# Patient Record
Sex: Male | Born: 1945 | Race: White | Hispanic: No | Marital: Married | Smoking: Former smoker
Health system: Southern US, Community
[De-identification: ages and names within clinical notes are randomized; demographics above are authoritative.]

## PROBLEM LIST (undated history)

## (undated) DIAGNOSIS — Z7901 Long term (current) use of anticoagulants: Secondary | ICD-10-CM

## (undated) DIAGNOSIS — E669 Obesity, unspecified: Secondary | ICD-10-CM

## (undated) DIAGNOSIS — R61 Generalized hyperhidrosis: Secondary | ICD-10-CM

## (undated) DIAGNOSIS — I639 Cerebral infarction, unspecified: Secondary | ICD-10-CM

## (undated) DIAGNOSIS — I77810 Thoracic aortic ectasia: Secondary | ICD-10-CM

## (undated) DIAGNOSIS — N183 Chronic kidney disease, stage 3 (moderate): Secondary | ICD-10-CM

## (undated) DIAGNOSIS — I452 Bifascicular block: Secondary | ICD-10-CM

## (undated) DIAGNOSIS — M109 Gout, unspecified: Secondary | ICD-10-CM

## (undated) DIAGNOSIS — M6282 Rhabdomyolysis: Secondary | ICD-10-CM

## (undated) DIAGNOSIS — Q6689 Other  specified congenital deformities of feet: Secondary | ICD-10-CM

## (undated) DIAGNOSIS — I4891 Unspecified atrial fibrillation: Principal | ICD-10-CM

## (undated) HISTORY — PX: HERNIA REPAIR: SHX51

## (undated) HISTORY — DX: Unspecified atrial fibrillation: I48.91

## (undated) HISTORY — PX: KIDNEY STONE SURGERY: SHX686

## (undated) HISTORY — DX: Obesity, unspecified: E66.9

## (undated) HISTORY — DX: Bifascicular block: I45.2

## (undated) HISTORY — DX: Generalized hyperhidrosis: R61

## (undated) HISTORY — DX: Chronic kidney disease, stage 3 (moderate): N18.3

## (undated) HISTORY — DX: Long term (current) use of anticoagulants: Z79.01

## (undated) HISTORY — DX: Thoracic aortic ectasia: I77.810

## (undated) HISTORY — PX: CATARACT EXTRACTION: SUR2

---

## 2007-08-09 ENCOUNTER — Ambulatory Visit (HOSPITAL_COMMUNITY): Admission: RE | Admit: 2007-08-09 | Discharge: 2007-08-09 | Payer: Self-pay | Admitting: Cardiology

## 2007-08-09 ENCOUNTER — Encounter (INDEPENDENT_AMBULATORY_CARE_PROVIDER_SITE_OTHER): Payer: Self-pay | Admitting: Cardiology

## 2012-09-28 DIAGNOSIS — I10 Essential (primary) hypertension: Secondary | ICD-10-CM | POA: Diagnosis not present

## 2012-09-28 DIAGNOSIS — I4891 Unspecified atrial fibrillation: Secondary | ICD-10-CM | POA: Diagnosis not present

## 2012-09-30 DIAGNOSIS — I4891 Unspecified atrial fibrillation: Secondary | ICD-10-CM | POA: Diagnosis not present

## 2012-10-03 DIAGNOSIS — I4891 Unspecified atrial fibrillation: Secondary | ICD-10-CM | POA: Diagnosis not present

## 2012-10-03 DIAGNOSIS — Z7901 Long term (current) use of anticoagulants: Secondary | ICD-10-CM | POA: Diagnosis not present

## 2012-10-11 DIAGNOSIS — I10 Essential (primary) hypertension: Secondary | ICD-10-CM | POA: Diagnosis not present

## 2012-10-11 DIAGNOSIS — Z7901 Long term (current) use of anticoagulants: Secondary | ICD-10-CM | POA: Diagnosis not present

## 2012-10-11 DIAGNOSIS — I4891 Unspecified atrial fibrillation: Secondary | ICD-10-CM | POA: Diagnosis not present

## 2012-10-13 DIAGNOSIS — I4891 Unspecified atrial fibrillation: Secondary | ICD-10-CM | POA: Diagnosis not present

## 2012-10-25 DIAGNOSIS — I4891 Unspecified atrial fibrillation: Secondary | ICD-10-CM | POA: Diagnosis not present

## 2012-10-25 DIAGNOSIS — Z7901 Long term (current) use of anticoagulants: Secondary | ICD-10-CM | POA: Diagnosis not present

## 2012-11-22 DIAGNOSIS — Z7901 Long term (current) use of anticoagulants: Secondary | ICD-10-CM | POA: Diagnosis not present

## 2012-11-22 DIAGNOSIS — I4891 Unspecified atrial fibrillation: Secondary | ICD-10-CM | POA: Diagnosis not present

## 2012-12-28 DIAGNOSIS — R61 Generalized hyperhidrosis: Secondary | ICD-10-CM | POA: Diagnosis not present

## 2012-12-28 DIAGNOSIS — Z7901 Long term (current) use of anticoagulants: Secondary | ICD-10-CM | POA: Diagnosis not present

## 2012-12-28 DIAGNOSIS — I4891 Unspecified atrial fibrillation: Secondary | ICD-10-CM | POA: Diagnosis not present

## 2012-12-28 DIAGNOSIS — I10 Essential (primary) hypertension: Secondary | ICD-10-CM | POA: Diagnosis not present

## 2013-01-05 DIAGNOSIS — I4891 Unspecified atrial fibrillation: Secondary | ICD-10-CM | POA: Diagnosis not present

## 2013-01-05 DIAGNOSIS — I10 Essential (primary) hypertension: Secondary | ICD-10-CM | POA: Diagnosis not present

## 2013-01-05 DIAGNOSIS — N189 Chronic kidney disease, unspecified: Secondary | ICD-10-CM | POA: Diagnosis not present

## 2013-01-05 DIAGNOSIS — R05 Cough: Secondary | ICD-10-CM | POA: Diagnosis not present

## 2013-01-05 DIAGNOSIS — K429 Umbilical hernia without obstruction or gangrene: Secondary | ICD-10-CM | POA: Diagnosis not present

## 2013-01-05 DIAGNOSIS — M2010 Hallux valgus (acquired), unspecified foot: Secondary | ICD-10-CM | POA: Diagnosis not present

## 2013-01-05 DIAGNOSIS — D631 Anemia in chronic kidney disease: Secondary | ICD-10-CM | POA: Diagnosis not present

## 2013-01-05 DIAGNOSIS — M109 Gout, unspecified: Secondary | ICD-10-CM | POA: Diagnosis not present

## 2013-01-11 DIAGNOSIS — Z7901 Long term (current) use of anticoagulants: Secondary | ICD-10-CM | POA: Diagnosis not present

## 2013-01-11 DIAGNOSIS — I4891 Unspecified atrial fibrillation: Secondary | ICD-10-CM | POA: Diagnosis not present

## 2013-01-27 ENCOUNTER — Other Ambulatory Visit: Payer: Self-pay | Admitting: Internal Medicine

## 2013-01-27 DIAGNOSIS — R05 Cough: Secondary | ICD-10-CM | POA: Diagnosis not present

## 2013-01-27 DIAGNOSIS — D638 Anemia in other chronic diseases classified elsewhere: Secondary | ICD-10-CM | POA: Diagnosis not present

## 2013-01-27 DIAGNOSIS — Z23 Encounter for immunization: Secondary | ICD-10-CM | POA: Diagnosis not present

## 2013-01-27 DIAGNOSIS — Z136 Encounter for screening for cardiovascular disorders: Secondary | ICD-10-CM | POA: Diagnosis not present

## 2013-01-27 DIAGNOSIS — M109 Gout, unspecified: Secondary | ICD-10-CM | POA: Diagnosis not present

## 2013-01-27 DIAGNOSIS — Z139 Encounter for screening, unspecified: Secondary | ICD-10-CM

## 2013-01-27 DIAGNOSIS — Z0000 Encounter for general adult medical examination without abnormal findings: Secondary | ICD-10-CM | POA: Diagnosis not present

## 2013-01-27 DIAGNOSIS — Z1211 Encounter for screening for malignant neoplasm of colon: Secondary | ICD-10-CM | POA: Diagnosis not present

## 2013-02-02 ENCOUNTER — Ambulatory Visit
Admission: RE | Admit: 2013-02-02 | Discharge: 2013-02-02 | Disposition: A | Discharge status: Home / Self Care | Payer: Medicare Other | Source: Ambulatory Visit | Attending: Internal Medicine | Admitting: Internal Medicine

## 2013-02-02 DIAGNOSIS — Z136 Encounter for screening for cardiovascular disorders: Secondary | ICD-10-CM | POA: Diagnosis not present

## 2013-02-02 DIAGNOSIS — Z139 Encounter for screening, unspecified: Secondary | ICD-10-CM

## 2013-02-08 ENCOUNTER — Ambulatory Visit (INDEPENDENT_AMBULATORY_CARE_PROVIDER_SITE_OTHER): Payer: Medicare Other | Admitting: Pharmacist

## 2013-02-08 DIAGNOSIS — I4891 Unspecified atrial fibrillation: Secondary | ICD-10-CM

## 2013-02-08 DIAGNOSIS — I4819 Other persistent atrial fibrillation: Secondary | ICD-10-CM | POA: Insufficient documentation

## 2013-02-08 DIAGNOSIS — Z1211 Encounter for screening for malignant neoplasm of colon: Secondary | ICD-10-CM | POA: Diagnosis not present

## 2013-02-08 HISTORY — DX: Unspecified atrial fibrillation: I48.91

## 2013-02-13 DIAGNOSIS — Z23 Encounter for immunization: Secondary | ICD-10-CM | POA: Diagnosis not present

## 2013-02-14 DIAGNOSIS — R05 Cough: Secondary | ICD-10-CM | POA: Diagnosis not present

## 2013-02-17 ENCOUNTER — Ambulatory Visit (INDEPENDENT_AMBULATORY_CARE_PROVIDER_SITE_OTHER): Payer: Medicare Other | Admitting: Pharmacist

## 2013-02-17 DIAGNOSIS — I4891 Unspecified atrial fibrillation: Secondary | ICD-10-CM | POA: Diagnosis not present

## 2013-03-03 ENCOUNTER — Ambulatory Visit (INDEPENDENT_AMBULATORY_CARE_PROVIDER_SITE_OTHER): Payer: Medicare Other | Admitting: Pharmacist

## 2013-03-03 DIAGNOSIS — I4891 Unspecified atrial fibrillation: Secondary | ICD-10-CM | POA: Diagnosis not present

## 2013-03-28 ENCOUNTER — Ambulatory Visit (INDEPENDENT_AMBULATORY_CARE_PROVIDER_SITE_OTHER): Payer: Medicare Other | Admitting: Cardiology

## 2013-03-28 ENCOUNTER — Ambulatory Visit (INDEPENDENT_AMBULATORY_CARE_PROVIDER_SITE_OTHER): Payer: Medicare Other | Admitting: *Deleted

## 2013-03-28 ENCOUNTER — Encounter: Payer: Self-pay | Admitting: Cardiology

## 2013-03-28 VITALS — BP 118/82 | HR 69 | Ht 72.0 in | Wt 228.0 lb

## 2013-03-28 DIAGNOSIS — E669 Obesity, unspecified: Secondary | ICD-10-CM | POA: Diagnosis not present

## 2013-03-28 DIAGNOSIS — I4891 Unspecified atrial fibrillation: Secondary | ICD-10-CM

## 2013-03-28 DIAGNOSIS — I77810 Thoracic aortic ectasia: Secondary | ICD-10-CM

## 2013-03-28 DIAGNOSIS — I10 Essential (primary) hypertension: Secondary | ICD-10-CM | POA: Diagnosis not present

## 2013-03-28 DIAGNOSIS — R61 Generalized hyperhidrosis: Secondary | ICD-10-CM

## 2013-03-28 DIAGNOSIS — I452 Bifascicular block: Secondary | ICD-10-CM | POA: Insufficient documentation

## 2013-03-28 DIAGNOSIS — N183 Chronic kidney disease, stage 3 unspecified: Secondary | ICD-10-CM

## 2013-03-28 DIAGNOSIS — L74519 Primary focal hyperhidrosis, unspecified: Secondary | ICD-10-CM

## 2013-03-28 DIAGNOSIS — Z7901 Long term (current) use of anticoagulants: Secondary | ICD-10-CM

## 2013-03-28 HISTORY — DX: Thoracic aortic ectasia: I77.810

## 2013-03-28 HISTORY — DX: Long term (current) use of anticoagulants: Z79.01

## 2013-03-28 HISTORY — DX: Generalized hyperhidrosis: R61

## 2013-03-28 HISTORY — DX: Chronic kidney disease, stage 3 unspecified: N18.30

## 2013-03-28 HISTORY — DX: Obesity, unspecified: E66.9

## 2013-03-28 HISTORY — DX: Bifascicular block: I45.2

## 2013-03-28 NOTE — Progress Notes (Signed)
[address] [place], [place]  [address] Phone: [phone] Fax:  [phone]  Date:  03/28/2013   ID:  Ralph Sullivan, DOB [birth year], MRN [id number]  PCP:  No primary provider on file.   History of Present Illness: Ralph Sullivan is a 67 y.o. male who was lost to followup for the last few years because of lack of insurance he states who presented to me in June of 2014 with return of atrial fibrillation. Heart rate 79 on EKG with right bundle branch block, left anterior fascicular block, bifascicular block.  In 2009, he converted with amiodarone. Amiodarone was used because of moderate LVH seen on prior echocardiogram, normal EF. In 2011 went back in afib (off of amiodarone). He believes he has been in atrial fibrillation over the past 2 years. He has not had any strokelike symptoms. He does battled with gout occasionally. No alcohol intake. No recent fevers. HR was slowed in NSR. his primary physician has been increasing his metoprolol and he is currently on 200 mg twice a day of metoprolol tartrate.  His heart rate seems to be fairly well controlled on EKG. He does state that when mowing the yard or performing physical activity he will get somewhat fatigued, diaphoretic at times.This continues. No CP. On anticoagulation.   An echocardiogram 6/14 demonstrated aortic root size 4.5 cm with mild LVH, normal EF.   Spirometry was reassuring. Has lost weight.     Wt Readings from Last 3 Encounters:  03/28/13 228 lb (103.42 kg)     No past medical history on file.  No past surgical history on file.  Current Outpatient Prescriptions  Medication Sig Dispense Refill  . BEE POLLEN PO Take by mouth daily.      . CHROMIUM-CINNAMON PO Take 200 mg by mouth.      . cloNIDine (CATAPRES) 0.2 MG tablet Take 0.2 mg by mouth 3 (three) times daily.       . COD LIVER OIL PO Take by mouth daily.      . Coenzyme Q10 (CO Q10) 200 MG CAPS Take by mouth daily.      Marland Kitchen lisinopril  (PRINIVIL,ZESTRIL) 40 MG tablet Take 40 mg by mouth daily.       . metoprolol (LOPRESSOR) 100 MG tablet Take 100 mg by mouth 4 (four) times daily.       . Omega-3 Fatty Acids (OMEGA 3 PO) Take by mouth.      . predniSONE (DELTASONE) 5 MG tablet Take 5 mg by mouth daily with breakfast.      . traMADol-acetaminophen (ULTRACET) 37.5-325 MG per tablet Take 1 tablet by mouth every 4 (four) hours as needed.       . vitamin E (VITAMIN E) 400 UNIT capsule Take 400 Units by mouth daily.      Marland Kitchen warfarin (COUMADIN) 5 MG tablet Take 5 mg by mouth one time only at 6 PM.        No current facility-administered medications for this visit.    Allergies:    Allergies  Allergen Reactions  . Sulfur     Social History:  The patient  reports that he quit smoking about 68 years ago. He does not have any smokeless tobacco history on file.   ROS:  Please see the history of present illness.   Denies any syncope, bleeding, orthopnea, PND   PHYSICAL EXAM: VS:  BP 118/82  Pulse 69  Ht 6' (1.829 m)  Wt 228 lb (103.42 kg)  BMI 30.92 kg/m2 Well nourished, well developed, in no acute distress HEENT: normal Neck: no JVD Cardiac:  normal S1, S2; RRR; no murmur Lungs:  clear to auscultation bilaterally, no wheezing, rhonchi or rales Abd: soft, nontender, no hepatomegalyOverweight Ext: no edema Skin: warm and dry Neuro: no focal abnormalities noted  EKG:  Atrial fibrillation heart rate 69, right bundle branch block, left anterior specific block, bifascicular block     ASSESSMENT AND PLAN:  1. Atrial fibrillation-Holter monitor 6/14 demonstrated an average heart rate of 87 beats per minute, longest pause of 2.3 seconds. Overall good rate control. No changes made. 2. Chronic kidney disease stage III-he has an appointment to see Dr. Hyman Hopes in December of 2014. Avoid NSAIDs 3. Chronic anticoagulation-Jeremy Smart, Pharm.D. monitoring. No bleeding. No side effects. No strokelike symptoms. 4. Obesity-encouraging  weight loss. He's done a great job with this. Continue. 5. Hypertension-currently excellent blood pressure control. He will periodically have elevated blood pressures in the morning hours. He has been on clonidine for quite some time and is hesitant to change. I think that it is reasonable for him to continue this medication. 6. Hyperhidrosis/sweating-TSH in the past has been normal. Unsure how to guide him in this situation. Continue to discuss with Dr. Gust Brooms.   Signed, Donato Schultz, MD [facility]  03/28/2013 9:47 AM

## 2013-03-28 NOTE — Patient Instructions (Signed)
Your physician wants you to follow-up in: 6 months with Dr. Skains You will receive a reminder letter in the mail two months in advance. If you don't receive a letter, please call our office to schedule the follow-up appointment.  Your physician recommends that you continue on your current medications as directed. Please refer to the Current Medication list given to you today.  

## 2013-03-31 DIAGNOSIS — M2010 Hallux valgus (acquired), unspecified foot: Secondary | ICD-10-CM | POA: Diagnosis not present

## 2013-03-31 DIAGNOSIS — M2040 Other hammer toe(s) (acquired), unspecified foot: Secondary | ICD-10-CM | POA: Diagnosis not present

## 2013-03-31 DIAGNOSIS — I4891 Unspecified atrial fibrillation: Secondary | ICD-10-CM | POA: Diagnosis not present

## 2013-03-31 DIAGNOSIS — J019 Acute sinusitis, unspecified: Secondary | ICD-10-CM | POA: Diagnosis not present

## 2013-03-31 DIAGNOSIS — M109 Gout, unspecified: Secondary | ICD-10-CM | POA: Diagnosis not present

## 2013-03-31 DIAGNOSIS — I10 Essential (primary) hypertension: Secondary | ICD-10-CM | POA: Diagnosis not present

## 2013-03-31 DIAGNOSIS — L74519 Primary focal hyperhidrosis, unspecified: Secondary | ICD-10-CM | POA: Diagnosis not present

## 2013-04-10 ENCOUNTER — Encounter: Payer: Self-pay | Admitting: Podiatry

## 2013-04-10 ENCOUNTER — Ambulatory Visit (INDEPENDENT_AMBULATORY_CARE_PROVIDER_SITE_OTHER): Payer: Medicare Other | Admitting: Podiatry

## 2013-04-10 ENCOUNTER — Ambulatory Visit (INDEPENDENT_AMBULATORY_CARE_PROVIDER_SITE_OTHER): Payer: Medicare Other

## 2013-04-10 VITALS — BP 123/87 | HR 88 | Resp 16 | Ht 72.0 in | Wt 234.0 lb

## 2013-04-10 DIAGNOSIS — M21611 Bunion of right foot: Secondary | ICD-10-CM

## 2013-04-10 DIAGNOSIS — M2040 Other hammer toe(s) (acquired), unspecified foot: Secondary | ICD-10-CM

## 2013-04-10 DIAGNOSIS — M21619 Bunion of unspecified foot: Secondary | ICD-10-CM

## 2013-04-10 DIAGNOSIS — M2041 Other hammer toe(s) (acquired), right foot: Secondary | ICD-10-CM

## 2013-04-10 DIAGNOSIS — M2010 Hallux valgus (acquired), unspecified foot: Secondary | ICD-10-CM | POA: Diagnosis not present

## 2013-04-10 NOTE — Progress Notes (Signed)
Subjective:     Patient ID: Ralph Sullivan, male   DOB: 08-Feb-1946, 67 y.o.   MRN: [id number]  HPI patient states that I have a lot of deformity and my right foot and I wanted to see whether or not it could be fixed. States it's been present for a number of years   Review of Systems  All other systems reviewed and are negative.       Objective:   Physical Exam  Nursing note and vitals reviewed. Constitutional: He is oriented to person, place, and time.  Cardiovascular: Intact distal pulses.   Musculoskeletal: Normal range of motion.  Neurological: He is oriented to person, place, and time.  Skin: Skin is warm.   patient is found to have severe malalignment of the right foot with deviation of the hallux under the second and third toes with dorsal elevation of the toes and rigid contracture the digits noted. 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 Due to the pain been minimal at this point I have recommended not fixing this unless he should start to develop further symptoms reappoint as needed

## 2013-04-10 NOTE — Progress Notes (Signed)
   Subjective:    Patient ID: Ralph Sullivan, male    DOB: Jan 18, 1946, 67 y.o.   MRN: [id number]  HPI Comments: "My toes are all crossed over"  Pt has a bunion and hammertoes right foot, severe. Been like this for several years just gotten worse. Not really painful, but uncomfortable at time, plus makes him lose his balance a lot. Get gout flares occasionally. Wears cushions to protect areas of pressure.     Review of Systems  Constitutional:       Sweating   HENT: Positive for sinus pressure.   Respiratory: Positive for shortness of breath and wheezing.   Endocrine: Positive for polydipsia.  Musculoskeletal: Positive for arthralgias.  Allergic/Immunologic: Positive for food allergies.  Hematological: Bruises/bleeds easily.  All other systems reviewed and are negative.       Objective:   Physical Exam        Assessment & Plan:

## 2013-04-11 ENCOUNTER — Ambulatory Visit (INDEPENDENT_AMBULATORY_CARE_PROVIDER_SITE_OTHER): Payer: Medicare Other | Admitting: *Deleted

## 2013-04-11 DIAGNOSIS — I4891 Unspecified atrial fibrillation: Secondary | ICD-10-CM

## 2013-04-11 LAB — POCT INR: INR: 2.6

## 2013-05-02 ENCOUNTER — Ambulatory Visit (INDEPENDENT_AMBULATORY_CARE_PROVIDER_SITE_OTHER): Payer: Medicare Other | Admitting: *Deleted

## 2013-05-02 DIAGNOSIS — I4891 Unspecified atrial fibrillation: Secondary | ICD-10-CM | POA: Diagnosis not present

## 2013-05-02 LAB — POCT INR: INR: 1.5

## 2013-05-16 DIAGNOSIS — N183 Chronic kidney disease, stage 3 unspecified: Secondary | ICD-10-CM | POA: Diagnosis not present

## 2013-05-16 DIAGNOSIS — I129 Hypertensive chronic kidney disease with stage 1 through stage 4 chronic kidney disease, or unspecified chronic kidney disease: Secondary | ICD-10-CM | POA: Diagnosis not present

## 2013-05-16 DIAGNOSIS — N2581 Secondary hyperparathyroidism of renal origin: Secondary | ICD-10-CM | POA: Diagnosis not present

## 2013-05-16 DIAGNOSIS — D649 Anemia, unspecified: Secondary | ICD-10-CM | POA: Diagnosis not present

## 2013-05-16 DIAGNOSIS — E785 Hyperlipidemia, unspecified: Secondary | ICD-10-CM | POA: Diagnosis not present

## 2013-05-17 ENCOUNTER — Ambulatory Visit (INDEPENDENT_AMBULATORY_CARE_PROVIDER_SITE_OTHER): Payer: Medicare Other | Admitting: *Deleted

## 2013-05-17 DIAGNOSIS — I4891 Unspecified atrial fibrillation: Secondary | ICD-10-CM | POA: Diagnosis not present

## 2013-05-17 LAB — POCT INR: INR: 1.9

## 2013-05-25 DIAGNOSIS — N183 Chronic kidney disease, stage 3 unspecified: Secondary | ICD-10-CM | POA: Diagnosis not present

## 2013-06-07 ENCOUNTER — Ambulatory Visit (INDEPENDENT_AMBULATORY_CARE_PROVIDER_SITE_OTHER): Payer: Medicare Other | Admitting: *Deleted

## 2013-06-07 DIAGNOSIS — I4891 Unspecified atrial fibrillation: Secondary | ICD-10-CM | POA: Diagnosis not present

## 2013-06-07 LAB — POCT INR: INR: 2.5

## 2013-06-26 DIAGNOSIS — E875 Hyperkalemia: Secondary | ICD-10-CM | POA: Diagnosis not present

## 2013-06-29 ENCOUNTER — Telehealth: Payer: Self-pay | Admitting: *Deleted

## 2013-06-29 MED ORDER — WARFARIN SODIUM 5 MG PO TABS: ORAL_TABLET | ORAL | Status: DC

## 2013-06-29 NOTE — Telephone Encounter (Signed)
Patients wife requests coumadin refill for patient to be sent to [facility] in [place]. Thanks, MI

## 2013-07-05 ENCOUNTER — Ambulatory Visit (INDEPENDENT_AMBULATORY_CARE_PROVIDER_SITE_OTHER): Payer: Medicare Other | Admitting: Pharmacist

## 2013-07-05 DIAGNOSIS — I4891 Unspecified atrial fibrillation: Secondary | ICD-10-CM

## 2013-07-05 LAB — POCT INR: INR: 2.3

## 2013-07-18 DIAGNOSIS — E785 Hyperlipidemia, unspecified: Secondary | ICD-10-CM | POA: Diagnosis not present

## 2013-07-18 DIAGNOSIS — N2581 Secondary hyperparathyroidism of renal origin: Secondary | ICD-10-CM | POA: Diagnosis not present

## 2013-07-18 DIAGNOSIS — N183 Chronic kidney disease, stage 3 unspecified: Secondary | ICD-10-CM | POA: Diagnosis not present

## 2013-08-02 ENCOUNTER — Ambulatory Visit (INDEPENDENT_AMBULATORY_CARE_PROVIDER_SITE_OTHER): Payer: Medicare Other | Admitting: Pharmacist

## 2013-08-02 DIAGNOSIS — I4891 Unspecified atrial fibrillation: Secondary | ICD-10-CM

## 2013-08-02 LAB — POCT INR: INR: 2.9

## 2013-08-08 DIAGNOSIS — H2589 Other age-related cataract: Secondary | ICD-10-CM | POA: Diagnosis not present

## 2013-08-08 DIAGNOSIS — H5200 Hypermetropia, unspecified eye: Secondary | ICD-10-CM | POA: Diagnosis not present

## 2013-09-13 ENCOUNTER — Ambulatory Visit (INDEPENDENT_AMBULATORY_CARE_PROVIDER_SITE_OTHER): Payer: Medicare Other | Admitting: *Deleted

## 2013-09-13 DIAGNOSIS — I4891 Unspecified atrial fibrillation: Secondary | ICD-10-CM

## 2013-09-13 LAB — POCT INR: INR: 4.6

## 2013-09-27 ENCOUNTER — Ambulatory Visit (INDEPENDENT_AMBULATORY_CARE_PROVIDER_SITE_OTHER): Payer: Medicare Other | Admitting: Pharmacist

## 2013-09-27 DIAGNOSIS — I4891 Unspecified atrial fibrillation: Secondary | ICD-10-CM | POA: Diagnosis not present

## 2013-09-27 LAB — POCT INR: INR: 2.8

## 2013-09-29 DIAGNOSIS — J209 Acute bronchitis, unspecified: Secondary | ICD-10-CM | POA: Diagnosis not present

## 2013-09-29 DIAGNOSIS — M109 Gout, unspecified: Secondary | ICD-10-CM | POA: Diagnosis not present

## 2013-09-29 DIAGNOSIS — I10 Essential (primary) hypertension: Secondary | ICD-10-CM | POA: Diagnosis not present

## 2013-09-29 DIAGNOSIS — N183 Chronic kidney disease, stage 3 unspecified: Secondary | ICD-10-CM | POA: Diagnosis not present

## 2013-10-13 ENCOUNTER — Ambulatory Visit (INDEPENDENT_AMBULATORY_CARE_PROVIDER_SITE_OTHER): Payer: Medicare Other | Admitting: *Deleted

## 2013-10-13 ENCOUNTER — Ambulatory Visit: Payer: Self-pay | Admitting: Cardiology

## 2013-10-13 ENCOUNTER — Other Ambulatory Visit (HOSPITAL_COMMUNITY): Payer: Self-pay | Admitting: Cardiology

## 2013-10-13 ENCOUNTER — Ambulatory Visit (HOSPITAL_COMMUNITY): Payer: Medicare Other | Attending: Internal Medicine | Admitting: Cardiology

## 2013-10-13 DIAGNOSIS — I4891 Unspecified atrial fibrillation: Secondary | ICD-10-CM

## 2013-10-13 DIAGNOSIS — I10 Essential (primary) hypertension: Secondary | ICD-10-CM | POA: Insufficient documentation

## 2013-10-13 DIAGNOSIS — E669 Obesity, unspecified: Secondary | ICD-10-CM | POA: Insufficient documentation

## 2013-10-13 DIAGNOSIS — I77810 Thoracic aortic ectasia: Secondary | ICD-10-CM | POA: Diagnosis not present

## 2013-10-13 LAB — POCT INR: INR: 3.8

## 2013-10-13 NOTE — Progress Notes (Signed)
Limited echo to assess aortic root size and LV fx.

## 2013-10-16 ENCOUNTER — Encounter: Payer: Self-pay | Admitting: Cardiology

## 2013-10-16 ENCOUNTER — Other Ambulatory Visit: Payer: Self-pay | Admitting: *Deleted

## 2013-10-16 ENCOUNTER — Ambulatory Visit (INDEPENDENT_AMBULATORY_CARE_PROVIDER_SITE_OTHER): Payer: Medicare Other | Admitting: Cardiology

## 2013-10-16 VITALS — BP 112/84 | HR 79 | Ht 72.0 in | Wt 225.0 lb

## 2013-10-16 DIAGNOSIS — E669 Obesity, unspecified: Secondary | ICD-10-CM

## 2013-10-16 DIAGNOSIS — Z7901 Long term (current) use of anticoagulants: Secondary | ICD-10-CM | POA: Diagnosis not present

## 2013-10-16 DIAGNOSIS — I482 Chronic atrial fibrillation, unspecified: Secondary | ICD-10-CM

## 2013-10-16 DIAGNOSIS — I77810 Thoracic aortic ectasia: Secondary | ICD-10-CM

## 2013-10-16 DIAGNOSIS — I4891 Unspecified atrial fibrillation: Secondary | ICD-10-CM | POA: Diagnosis not present

## 2013-10-16 DIAGNOSIS — I452 Bifascicular block: Secondary | ICD-10-CM | POA: Diagnosis not present

## 2013-10-16 NOTE — Progress Notes (Signed)
[address] [place], [place]  [address] Phone: [phone] Fax:  [phone]  Date:  10/16/2013   ID:  Ralph ScarceSammy K Becka, DOB 04/09/1946, MRN [id number]  PCP:  Raelyn MoraJARALLA SHAMLEFFER, Brent BullaIBTEHAL, MD   History of Present Illness: Ralph Sullivan is a 68 y.o. male (husband of Dustin FolksMary Twedt) who had been lost to followup for the last few years because of lack of insurance he states who presented to me in June of 2014 with return of atrial fibrillation. Heart rate 79 on EKG with right bundle branch block, left anterior fascicular block, bifascicular block.  In 2009, he converted with amiodarone. Amiodarone was used because of moderate LVH seen on prior echocardiogram, normal EF. In 2011 went back in afib (off of amiodarone). He believes he has been in atrial fibrillation over the past 3 years. He has not had any strokelike symptoms. He does battled with gout occasionally. No alcohol intake. No recent fevers. HR was slowed in NSR. his primary physician has been increasing his metoprolol and he is currently on 200 mg twice a day of metoprolol tartrate.  His heart rate seems to be fairly well controlled on EKG. He does state that when mowing the yard or performing physical activity he will get somewhat fatigued, diaphoretic at times.This continues. No CP. On anticoagulation.   An echocardiogram 6/14 demonstrated aortic root size 4.5 cm with mild LVH, normal EF. On repeat 6/15 - 4.2cm  Spirometry was reassuring. Has lost weight.     Wt Readings from Last 3 Encounters:  10/16/13 225 lb (102.059 kg)  04/10/13 234 lb (106.142 kg)  03/28/13 228 lb (103.42 kg)     Past Medical History  Diagnosis Date  . Obesity, unspecified 03/28/2013  . Atrial fibrillation 02/08/2013    CHADS-VAS - 2 (AGE, HTN). On anticoagulation. Holter 6/14 - 2.3 sec pause. Avg HR 87. Rate control   . Chronic kidney disease, stage III (moderate) 03/28/2013  . Chronic anticoagulation 03/28/2013  . Hyperhidrosis  03/28/2013  . 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 Bifascicular block 03/28/2013    No past surgical history on file.  Current Outpatient Prescriptions  Medication Sig Dispense Refill  . CHROMIUM-CINNAMON PO Take 200 mg by mouth.      . cloNIDine (CATAPRES) 0.2 MG tablet Take 0.2 mg by mouth 3 (three) times daily.       . Coenzyme Q10 (CO Q10) 200 MG CAPS Take by mouth daily.      Marland Kitchen. lisinopril (PRINIVIL,ZESTRIL) 40 MG tablet Take 40 mg by mouth daily.       . metoprolol (LOPRESSOR) 100 MG tablet Take 100 mg by mouth 4 (four) times daily.       . vitamin E (VITAMIN E) 400 UNIT capsule Take 400 Units by mouth daily.      Marland Kitchen. warfarin (COUMADIN) 5 MG tablet Take as directed by coumadin clinic  35 tablet  3  . BEE POLLEN PO Take by mouth daily.      . cephALEXin (KEFLEX) 500 MG capsule Take 500 mg by mouth 2 (two) times daily.      . COD LIVER OIL PO Take by mouth daily.      . Omega-3 Fatty Acids (OMEGA 3 PO) Take by mouth.      . predniSONE (DELTASONE) 5 MG tablet Take 5 mg by mouth daily with breakfast.      . traMADol-acetaminophen (  ULTRACET) 37.5-325 MG per tablet Take 1 tablet by mouth every 4 (four) hours as needed.        No current facility-administered medications for this visit.    Allergies:    Allergies  Allergen Reactions  . Sulfur     Social History:  The patient  reports that he quit smoking about 68 years ago. He does not have any smokeless tobacco history on file.   ROS:  Please see the history of present illness.   Denies any syncope, bleeding, orthopnea, PND   PHYSICAL EXAM: VS:  BP 112/84  Pulse 79  Ht 6' (1.829 m)  Wt 225 lb (102.059 kg)  BMI 30.51 kg/m2 Well nourished, well developed, in no acute distress HEENT: normal Neck: no JVD Cardiac:  normal S1, S2; irreg irreg; no murmur Lungs:  clear to auscultation bilaterally, no wheezing, rhonchi or rales Abd: soft, nontender, no  hepatomegalyOverweight Ext: no edema Skin: warm and dry Neuro: no focal abnormalities noted  EKG:  Atrial fibrillation heart rate 69, right bundle branch block, left anterior specific block, bifascicular block     ASSESSMENT AND PLAN:  1. Atrial fibrillation-Holter monitor 6/14 demonstrated an average heart rate of 87 beats per minute, longest pause of 2.3 seconds. Overall good rate control. No changes made. Metoprolol 100 BID.  2. Dilated aortic root - 42mm stable.  3. Chronic kidney disease stage III- Dr. Hyman HopesWebb. Avoid NSAIDs 4. Chronic anticoagulation-Jeremy Smart, Pharm.D. monitoring. No bleeding. No side effects. No strokelike symptoms. Recent elevated INR. Repeating early July. 5. Obesity-encouraging weight loss. He's done a great job with this. >20 pounds. Continue. 6. Hypertension-currently excellent blood pressure control. He will periodically have elevated blood pressures in the morning hours. He has been on clonidine for quite some time and is hesitant to change. I think that it is reasonable for him to continue this medication. 7. Hyperhidrosis/sweating-TSH in the past has been normal. Unsure how to guide him in this situation. Continue to discuss with Dr. Gust BroomsJaralla.   Signed, Donato SchultzMark Matej Sappenfield, MD [facility]  10/16/2013 9:13 AM

## 2013-10-16 NOTE — Patient Instructions (Signed)
Your physician recommends that you continue on your current medications as directed. Please refer to the Current Medication list given to you today.  Your physician wants you to follow-up in: 6 MONTH OV  You will receive a reminder letter in the mail two months in advance. If you don't receive a letter, please call our office to schedule the follow-up appointment.  

## 2013-10-30 ENCOUNTER — Other Ambulatory Visit: Payer: Self-pay | Admitting: Cardiology

## 2013-10-30 ENCOUNTER — Ambulatory Visit (INDEPENDENT_AMBULATORY_CARE_PROVIDER_SITE_OTHER): Payer: Medicare Other | Admitting: Pharmacist

## 2013-10-30 DIAGNOSIS — I4891 Unspecified atrial fibrillation: Secondary | ICD-10-CM | POA: Diagnosis not present

## 2013-10-30 LAB — POCT INR: INR: 2.9

## 2013-11-20 ENCOUNTER — Ambulatory Visit (INDEPENDENT_AMBULATORY_CARE_PROVIDER_SITE_OTHER): Payer: Medicare Other | Admitting: Pharmacist

## 2013-11-20 DIAGNOSIS — I4891 Unspecified atrial fibrillation: Secondary | ICD-10-CM | POA: Diagnosis not present

## 2013-11-20 LAB — POCT INR: INR: 2.7

## 2013-12-13 ENCOUNTER — Encounter (HOSPITAL_COMMUNITY): Payer: Self-pay | Admitting: Cardiology

## 2013-12-18 ENCOUNTER — Ambulatory Visit (INDEPENDENT_AMBULATORY_CARE_PROVIDER_SITE_OTHER): Payer: Medicare Other | Admitting: *Deleted

## 2013-12-18 DIAGNOSIS — I4891 Unspecified atrial fibrillation: Secondary | ICD-10-CM | POA: Diagnosis not present

## 2013-12-18 LAB — POCT INR: INR: 4.7

## 2013-12-27 ENCOUNTER — Ambulatory Visit (INDEPENDENT_AMBULATORY_CARE_PROVIDER_SITE_OTHER): Payer: Medicare Other | Admitting: Pharmacist

## 2013-12-27 DIAGNOSIS — I4891 Unspecified atrial fibrillation: Secondary | ICD-10-CM

## 2013-12-27 LAB — POCT INR: INR: 1.8

## 2014-01-10 ENCOUNTER — Ambulatory Visit (INDEPENDENT_AMBULATORY_CARE_PROVIDER_SITE_OTHER): Payer: Medicare Other | Admitting: Pharmacist

## 2014-01-10 DIAGNOSIS — I4891 Unspecified atrial fibrillation: Secondary | ICD-10-CM

## 2014-01-10 LAB — POCT INR: INR: 2.3

## 2014-01-30 DIAGNOSIS — I129 Hypertensive chronic kidney disease with stage 1 through stage 4 chronic kidney disease, or unspecified chronic kidney disease: Secondary | ICD-10-CM | POA: Diagnosis not present

## 2014-01-30 DIAGNOSIS — M1A0491 Idiopathic chronic gout, unspecified hand, with tophus (tophi): Secondary | ICD-10-CM | POA: Diagnosis not present

## 2014-01-30 DIAGNOSIS — J302 Other seasonal allergic rhinitis: Secondary | ICD-10-CM | POA: Diagnosis not present

## 2014-01-30 DIAGNOSIS — Z23 Encounter for immunization: Secondary | ICD-10-CM | POA: Diagnosis not present

## 2014-01-31 ENCOUNTER — Ambulatory Visit (INDEPENDENT_AMBULATORY_CARE_PROVIDER_SITE_OTHER): Payer: Medicare Other | Admitting: Pharmacist

## 2014-01-31 DIAGNOSIS — I4891 Unspecified atrial fibrillation: Secondary | ICD-10-CM

## 2014-01-31 LAB — POCT INR: INR: 2

## 2014-02-09 ENCOUNTER — Other Ambulatory Visit: Payer: Self-pay

## 2014-02-28 ENCOUNTER — Ambulatory Visit (INDEPENDENT_AMBULATORY_CARE_PROVIDER_SITE_OTHER): Payer: Medicare Other | Admitting: *Deleted

## 2014-02-28 DIAGNOSIS — I4891 Unspecified atrial fibrillation: Secondary | ICD-10-CM | POA: Diagnosis not present

## 2014-02-28 LAB — POCT INR: INR: 1.9

## 2014-03-02 ENCOUNTER — Other Ambulatory Visit: Payer: Self-pay | Admitting: Cardiology

## 2014-03-04 ENCOUNTER — Other Ambulatory Visit: Payer: Self-pay | Admitting: Cardiology

## 2014-03-21 ENCOUNTER — Ambulatory Visit (INDEPENDENT_AMBULATORY_CARE_PROVIDER_SITE_OTHER): Payer: Medicare Other | Admitting: Pharmacist

## 2014-03-21 DIAGNOSIS — I4891 Unspecified atrial fibrillation: Secondary | ICD-10-CM | POA: Diagnosis not present

## 2014-03-21 LAB — POCT INR: INR: 2.7

## 2014-03-21 MED ORDER — WARFARIN SODIUM 5 MG PO TABS: 5.0000 mg | ORAL_TABLET | Freq: Every day | ORAL | Status: DC

## 2014-03-30 DIAGNOSIS — Z23 Encounter for immunization: Secondary | ICD-10-CM | POA: Diagnosis not present

## 2014-03-30 DIAGNOSIS — N183 Chronic kidney disease, stage 3 (moderate): Secondary | ICD-10-CM | POA: Diagnosis not present

## 2014-03-30 DIAGNOSIS — I10 Essential (primary) hypertension: Secondary | ICD-10-CM | POA: Diagnosis not present

## 2014-03-30 DIAGNOSIS — R8290 Unspecified abnormal findings in urine: Secondary | ICD-10-CM | POA: Diagnosis not present

## 2014-03-30 DIAGNOSIS — Z125 Encounter for screening for malignant neoplasm of prostate: Secondary | ICD-10-CM | POA: Diagnosis not present

## 2014-03-30 DIAGNOSIS — R270 Ataxia, unspecified: Secondary | ICD-10-CM | POA: Diagnosis not present

## 2014-03-30 DIAGNOSIS — Z0001 Encounter for general adult medical examination with abnormal findings: Secondary | ICD-10-CM | POA: Diagnosis not present

## 2014-03-30 DIAGNOSIS — I480 Paroxysmal atrial fibrillation: Secondary | ICD-10-CM | POA: Diagnosis not present

## 2014-03-30 DIAGNOSIS — Z1389 Encounter for screening for other disorder: Secondary | ICD-10-CM | POA: Diagnosis not present

## 2014-03-30 DIAGNOSIS — M545 Low back pain: Secondary | ICD-10-CM | POA: Diagnosis not present

## 2014-04-02 ENCOUNTER — Other Ambulatory Visit: Payer: Self-pay | Admitting: Internal Medicine

## 2014-04-02 DIAGNOSIS — R270 Ataxia, unspecified: Secondary | ICD-10-CM

## 2014-04-06 ENCOUNTER — Ambulatory Visit
Admission: RE | Admit: 2014-04-06 | Discharge: 2014-04-06 | Disposition: A | Discharge status: Home / Self Care | Payer: Medicare Other | Source: Ambulatory Visit | Attending: Internal Medicine | Admitting: Internal Medicine

## 2014-04-06 DIAGNOSIS — R270 Ataxia, unspecified: Secondary | ICD-10-CM

## 2014-04-06 DIAGNOSIS — S0990XA Unspecified injury of head, initial encounter: Secondary | ICD-10-CM | POA: Diagnosis not present

## 2014-04-09 ENCOUNTER — Other Ambulatory Visit: Payer: Self-pay | Admitting: Internal Medicine

## 2014-04-09 DIAGNOSIS — I6381 Other cerebral infarction due to occlusion or stenosis of small artery: Secondary | ICD-10-CM

## 2014-04-10 ENCOUNTER — Ambulatory Visit (INDEPENDENT_AMBULATORY_CARE_PROVIDER_SITE_OTHER): Payer: Medicare Other | Admitting: Cardiology

## 2014-04-10 ENCOUNTER — Encounter: Payer: Self-pay | Admitting: Cardiology

## 2014-04-10 VITALS — BP 118/84 | HR 79 | Ht 72.0 in | Wt 228.0 lb

## 2014-04-10 DIAGNOSIS — I639 Cerebral infarction, unspecified: Secondary | ICD-10-CM | POA: Diagnosis not present

## 2014-04-10 DIAGNOSIS — I10 Essential (primary) hypertension: Secondary | ICD-10-CM

## 2014-04-10 DIAGNOSIS — R0602 Shortness of breath: Secondary | ICD-10-CM

## 2014-04-10 DIAGNOSIS — I452 Bifascicular block: Secondary | ICD-10-CM | POA: Diagnosis not present

## 2014-04-10 DIAGNOSIS — I77810 Thoracic aortic ectasia: Secondary | ICD-10-CM

## 2014-04-10 DIAGNOSIS — I482 Chronic atrial fibrillation, unspecified: Secondary | ICD-10-CM

## 2014-04-10 NOTE — Patient Instructions (Signed)
The current medical regimen is effective;  continue present plan and medications.  Your physician has requested that you have a lexiscan myoview. For further information please visit [email or web address]. Please follow instruction sheet, as given.  Follow up in 6 months with Dr. Skains.  You will receive a letter in the mail 2 months before you are due.  Please call us when you receive this letter to schedule your follow up appointment.  Thank you for choosing [facility]!!     

## 2014-04-10 NOTE — Progress Notes (Signed)
[address] [place], [place]  [address] Phone: [phone] Fax:  [phone]  Date:  04/10/2014   ID:  Ralph Sullivan, DOB 03/18/1946, MRN [id number]  PCP:  Ralph Sullivan, IBTEHAL, MD   History of Present Illness: Ralph Sullivan is a 68 y.o. male (husband of Ralph FolksMary Sullivan) who presented to me in June of 2014 with return of atrial fibrillation. Heart rate 79 on EKG with right bundle branch block, left anterior fascicular block, bifascicular block.  In 2009, he converted with amiodarone. Amiodarone was used because of moderate LVH seen on prior echocardiogram, normal EF. In 2011 went back in afib (off of amiodarone). He believes he has been in atrial fibrillation over the past 3 years. He has not had any strokelike symptoms. He does battled with gout occasionally. No alcohol intake. No recent fevers. HR was slowed in NSR. his primary physician has been increasing his metoprolol and he is currently on 200 mg twice a day of metoprolol tartrate.  His heart rate seems to be fairly well controlled on EKG. He does state that when mowing the yard or performing physical activity he will get somewhat fatigued, diaphoretic at times.This continues. No CP. On anticoagulation.   An echocardiogram 6/14 demonstrated aortic root size 4.5 cm with mild LVH, normal EF. On repeat 6/15 - 4.2cm  Spirometry was reassuring. Has lost weight.   Right side of brain ? Lesion. Falling.    Wt Readings from Last 3 Encounters:  04/10/14 228 lb (103.42 kg)  10/16/13 225 lb (102.059 kg)  04/10/13 234 lb (106.142 kg)     Past Medical History  Diagnosis Date  . Obesity, unspecified 03/28/2013  . Atrial fibrillation 02/08/2013    CHADS-VAS - 2 (AGE, HTN). On anticoagulation. Holter 6/14 - 2.3 sec pause. Avg HR 87. Rate control   . Chronic kidney disease, stage III (moderate) 03/28/2013  . Chronic anticoagulation 03/28/2013  . Hyperhidrosis 03/28/2013  . 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 Bifascicular block 03/28/2013    No past surgical history on file.  Current Outpatient Prescriptions  Medication Sig Dispense Refill  . BEE POLLEN PO Take by mouth daily.    . CHROMIUM-CINNAMON PO Take 200 mg by mouth.    . cloNIDine (CATAPRES) 0.2 MG tablet Take 0.2 mg by mouth daily.     . COD LIVER OIL PO Take by mouth daily.    . Coenzyme Q10 (CO Q10) 200 MG CAPS Take by mouth daily.    Marland Kitchen. losartan (COZAAR) 50 MG tablet     . metoprolol (LOPRESSOR) 100 MG tablet Take 100 mg by mouth 4 (four) times daily.     . Omega-3 Fatty Acids (OMEGA 3 PO) Take by mouth.    . predniSONE (DELTASONE) 5 MG tablet Take 5 mg by mouth daily with breakfast.    . traMADol-acetaminophen (ULTRACET) 37.5-325 MG per tablet Take 1 tablet by mouth every 4 (four) hours as needed.     . vitamin E (VITAMIN E) 400 UNIT capsule Take 400 Units by mouth daily.    Marland Kitchen. warfarin (COUMADIN) 5 MG tablet Take 1-1.5 tablets (5-7.5 mg total) by mouth daily. 40 tablet 3   No current facility-administered medications for this visit.    Allergies:    Allergies  Allergen Reactions  . Sulfur     Social History:  The patient  reports that he quit smoking about 69 years  ago. He does not have any smokeless tobacco history on file.   ROS:  Please see the history of present illness.   Denies any syncope, bleeding, orthopnea, PND   PHYSICAL EXAM: VS:  BP 118/84 mmHg  Pulse 79  Ht 6' (1.829 m)  Wt 228 lb (103.42 kg)  BMI 30.92 kg/m2 Well nourished, well developed, in no acute distress HEENT: normal Neck: no JVD Cardiac:  normal S1, S2; irreg irreg; no murmur Lungs:  clear to auscultation bilaterally, no wheezing, rhonchi or rales Abd: soft, nontender, no hepatomegalyOverweight Ext: no edema Skin: warm and dry Neuro: no focal abnormalities noted  EKG:  04/10/14-right bundle branch block, atrial fibrillation, 79, left anterior fascicular block, bifascicular  block-no change from prior Atrial fibrillation heart rate 69, right bundle branch block, left anterior specific block, bifascicular block     ASSESSMENT AND PLAN:  1. Atrial fibrillation-Holter monitor 6/14 demonstrated an average heart rate of 87 beats per minute, longest pause of 2.3 seconds. Overall good rate control. No changes made. Metoprolol 100 4 times a day, 2 in the morning, 2 in the evening. 2. Dyspnea/hypotension with exercise-I will check a pharmacologic stress test. Hypotension with exercise and be a sign of ischemia. Do not hold beta blocker for study. Pharmacologic study given his atrial fibrillation. 3. Dilated aortic root - 42mm stable. Echocardiogram reviewed 4. Chronic kidney disease stage III- Dr. Hyman Sullivan. Avoid NSAIDs 5. Chronic anticoagulation-Ralph Sullivan, Pharm.D. monitoring. No bleeding. No side effects. No strokelike symptoms. Recent elevated INR. Repeating early July. 6. Obesity-encouraging weight loss. He's done a great job with this. >20 pounds. Continue. 7. Hypertension-currently excellent blood pressure control. In fact, he can feel some hypotension with mowing the yard or exercising. Dr. Gust Sullivan is currently adjusting medications, may need to pull back even more on his clonidine. 8. Hyperhidrosis/sweating-TSH in the past has been normal. Continue to discuss with Dr. Gust Sullivan. Getting a head scan. 9. Six-month follow-up  Signed, Ralph SchultzMark Axelle Szwed, MD [facility]  04/10/2014 9:31 AM

## 2014-04-11 DIAGNOSIS — Z0189 Encounter for other specified special examinations: Secondary | ICD-10-CM | POA: Diagnosis not present

## 2014-04-14 ENCOUNTER — Ambulatory Visit
Admission: RE | Admit: 2014-04-14 | Discharge: 2014-04-14 | Disposition: A | Discharge status: Home / Self Care | Payer: Medicare Other | Source: Ambulatory Visit | Attending: Internal Medicine | Admitting: Internal Medicine

## 2014-04-14 DIAGNOSIS — R296 Repeated falls: Secondary | ICD-10-CM | POA: Diagnosis not present

## 2014-04-14 DIAGNOSIS — I6381 Other cerebral infarction due to occlusion or stenosis of small artery: Secondary | ICD-10-CM

## 2014-04-14 DIAGNOSIS — R251 Tremor, unspecified: Secondary | ICD-10-CM | POA: Diagnosis not present

## 2014-04-14 DIAGNOSIS — R269 Unspecified abnormalities of gait and mobility: Secondary | ICD-10-CM | POA: Diagnosis not present

## 2014-04-16 ENCOUNTER — Ambulatory Visit
Admission: RE | Admit: 2014-04-16 | Discharge: 2014-04-16 | Disposition: A | Discharge status: Home / Self Care | Payer: Medicare Other | Source: Ambulatory Visit | Attending: Internal Medicine | Admitting: Internal Medicine

## 2014-04-16 DIAGNOSIS — I6381 Other cerebral infarction due to occlusion or stenosis of small artery: Secondary | ICD-10-CM

## 2014-04-16 DIAGNOSIS — I6523 Occlusion and stenosis of bilateral carotid arteries: Secondary | ICD-10-CM | POA: Diagnosis not present

## 2014-04-18 ENCOUNTER — Ambulatory Visit (HOSPITAL_COMMUNITY): Payer: Medicare Other | Attending: Cardiovascular Disease | Admitting: Radiology

## 2014-04-18 ENCOUNTER — Ambulatory Visit (INDEPENDENT_AMBULATORY_CARE_PROVIDER_SITE_OTHER): Payer: Medicare Other | Admitting: *Deleted

## 2014-04-18 DIAGNOSIS — I482 Chronic atrial fibrillation, unspecified: Secondary | ICD-10-CM

## 2014-04-18 DIAGNOSIS — I4891 Unspecified atrial fibrillation: Secondary | ICD-10-CM | POA: Diagnosis not present

## 2014-04-18 DIAGNOSIS — I4510 Unspecified right bundle-branch block: Secondary | ICD-10-CM | POA: Diagnosis not present

## 2014-04-18 DIAGNOSIS — Z1211 Encounter for screening for malignant neoplasm of colon: Secondary | ICD-10-CM | POA: Diagnosis not present

## 2014-04-18 DIAGNOSIS — R0602 Shortness of breath: Secondary | ICD-10-CM | POA: Diagnosis not present

## 2014-04-18 DIAGNOSIS — I10 Essential (primary) hypertension: Secondary | ICD-10-CM | POA: Diagnosis not present

## 2014-04-18 LAB — POCT INR: INR: 2.9

## 2014-04-18 MED ORDER — TECHNETIUM TC 99M SESTAMIBI GENERIC - CARDIOLITE
10.0000 | Freq: Once | INTRAVENOUS | Status: AC | PRN
  Administered 2014-04-18: 10 via INTRAVENOUS

## 2014-04-18 MED ORDER — TECHNETIUM TC 99M SESTAMIBI GENERIC - CARDIOLITE
30.0000 | Freq: Once | INTRAVENOUS | Status: AC | PRN
  Administered 2014-04-18: 30 via INTRAVENOUS

## 2014-04-18 MED ORDER — REGADENOSON 0.4 MG/5ML IV SOLN
0.4000 mg | Freq: Once | INTRAVENOUS | Status: AC
  Administered 2014-04-18: 0.4 mg via INTRAVENOUS

## 2014-04-18 NOTE — Progress Notes (Signed)
[facility] SITE 3 NUCLEAR MED [address] [place], [place] [address] [phone]    Cardiology Nuclear Med Study  [name] is a 68 y.o. male     MRN : [id number]     DOB: 10/02/1945  Procedure Date: 04/18/2014  Nuclear Med Background Indication for Stress Test:  Evaluation for Ischemia History:  No known CAD Cardiac Risk Factors: Carotid Disease, Hypertension, RBBB and Atrial Fib  Symptoms:  SOB   Nuclear Pre-Procedure Caffeine/Decaff Intake:  None NPO After: 7:00pm   Lungs:  clear O2 Sat: 98% on room air. IV 0.9% NS with Angio Cath:  22g  IV Site: R Hand  IV Started by:  Cathlyn Parsonsynthia Hasspacher, RN  Chest Size (in):  52 Cup Size: n/a  Height: 6' (1.829 m)  Weight:  226 lb (102.513 kg)  BMI:  Body mass index is 30.64 kg/(m^2). Tech Comments:  Patient took Lopressor at [employer]0530 today    Nuclear Med Study 1 or 2 day study: 1 day  Stress Test Type:  Lexiscan  Reading MD: n/a  Order Authorizing Provider:  Louis MatteMark Skains,MD  Resting Radionuclide: Technetium 6450m Sestamibi  Resting Radionuclide Dose: 11.0 mCi   Stress Radionuclide:  Technetium 3750m Sestamibi  Stress Radionuclide Dose: 33.0 mCi           Stress Protocol Rest HR: 73 Stress HR: 82  Rest BP: 138/100 Stress BP: 150/107  Exercise Time (min): n/a METS: n/a   Predicted Max HR: 154 bpm % Max HR: 53.25 bpm Rate Pressure Product: [id number]   Dose of Adenosine (mg):  n/a Dose of Lexiscan: 0.4 mg  Dose of Atropine (mg): n/a Dose of Dobutamine: n/a mcg/kg/min (at max HR)  Stress Test Technologist: Nelson ChimesSharon Brooks, BS-ES  Nuclear Technologist:  Doyne Keelonya Yount, CNMT     Rest Procedure:  Myocardial perfusion imaging was performed at rest 45 minutes following the intravenous administration of Technetium 250m Sestamibi. Rest ECG: Atrial fibrillation, RBBB, LAFB.  Stress Procedure:  The patient received IV Lexiscan 0.4 mg over 15-seconds.  Technetium 4050m Sestamibi injected at 30-seconds.  Quantitative spect images were  obtained after a 45 minute delay.  During the infusion of Lexiscan the patient complained of SOB and stomach upset.  These symptoms began to resolve in recovery.  Stress ECG: No significant ST segment change suggestive of ischemia.  QPS Raw Data Images:  Acquisition technically good; LVE. Stress Images:  There is decreased uptake in the inferior wall and apex. Rest Images:  There is decreased uptake in the inferior wall and apex. Subtraction (SDS):  There is a fixed defect that is most consistent with a previous infarction. Transient Ischemic Dilatation (Normal <1.22):  1.13 Lung/Heart Ratio (Normal <0.45):  0.31  Quantitative Gated Spect Images QGS EDV:  n/a QGS ESV:  n/a  Impression Exercise Capacity:  Lexiscan with no exercise. BP Response:  Normal blood pressure response. Clinical Symptoms:  There is dyspnea. ECG Impression:  No significant ST segment change suggestive of ischemia. Comparison with Prior Nuclear Study: No previous nuclear study performed  Overall Impression:  Low risk stress nuclear study with a moderate size, severe intensity, fixed inferior and apical defect consistent with prior infarct; no ischemia.  LV Ejection Fraction: Study not gated.  LV Wall Motion:  Study not gated  [facility] Crenshaw

## 2014-05-16 ENCOUNTER — Ambulatory Visit (INDEPENDENT_AMBULATORY_CARE_PROVIDER_SITE_OTHER): Payer: Medicare Other | Admitting: *Deleted

## 2014-05-16 DIAGNOSIS — I4891 Unspecified atrial fibrillation: Secondary | ICD-10-CM

## 2014-05-16 LAB — POCT INR: INR: 2

## 2014-05-17 ENCOUNTER — Telehealth: Payer: Self-pay | Admitting: *Deleted

## 2014-05-17 NOTE — Telephone Encounter (Signed)
Spoke with pt made him aware of Dr Judd GaudierSkain's recommendations that he doesn't want him to take 81mg  of ASA.

## 2014-05-17 NOTE — Telephone Encounter (Signed)
-----   Message from Donato SchultzMark Skains, MD sent at 05/17/2014  6:56 AM EST ----- Regarding: RE: Test Results Since he is on Warfarin, this alone should be sufficient. No ASA as this may increase bleeding risk.  Thanks Donato SchultzSKAINS, MARK, MD  ----- Message -----    From: Raul Deliffany J Ahmaad Neidhardt, RN    Sent: 05/16/2014   8:10 AM      To: Donato SchultzMark Skains, MD Subject: Test Results                                   Pt states that his PCP Dr [facility]by [facility]alla [facility] of Brain in December after his visit with you. Pt states that PCP suggests 81mg  ASA. Please review her OV notes and let me know, then I will inform patient.

## 2014-06-06 ENCOUNTER — Ambulatory Visit (INDEPENDENT_AMBULATORY_CARE_PROVIDER_SITE_OTHER): Payer: Medicare Other | Admitting: *Deleted

## 2014-06-06 DIAGNOSIS — I4891 Unspecified atrial fibrillation: Secondary | ICD-10-CM

## 2014-06-06 LAB — POCT INR: INR: 2.4

## 2014-06-06 MED ORDER — WARFARIN SODIUM 5 MG PO TABS: ORAL_TABLET | ORAL | Status: DC

## 2014-07-05 ENCOUNTER — Ambulatory Visit (INDEPENDENT_AMBULATORY_CARE_PROVIDER_SITE_OTHER): Payer: Medicare Other

## 2014-07-05 DIAGNOSIS — I4891 Unspecified atrial fibrillation: Secondary | ICD-10-CM

## 2014-07-05 LAB — POCT INR: INR: 2.5

## 2014-08-15 DIAGNOSIS — N2581 Secondary hyperparathyroidism of renal origin: Secondary | ICD-10-CM | POA: Diagnosis not present

## 2014-08-15 DIAGNOSIS — N183 Chronic kidney disease, stage 3 (moderate): Secondary | ICD-10-CM | POA: Diagnosis not present

## 2014-08-15 DIAGNOSIS — E785 Hyperlipidemia, unspecified: Secondary | ICD-10-CM | POA: Diagnosis not present

## 2014-08-16 ENCOUNTER — Ambulatory Visit (INDEPENDENT_AMBULATORY_CARE_PROVIDER_SITE_OTHER): Payer: Medicare Other | Admitting: *Deleted

## 2014-08-16 DIAGNOSIS — I4891 Unspecified atrial fibrillation: Secondary | ICD-10-CM

## 2014-08-16 LAB — POCT INR: INR: 1.8

## 2014-08-29 ENCOUNTER — Ambulatory Visit (INDEPENDENT_AMBULATORY_CARE_PROVIDER_SITE_OTHER): Payer: Medicare Other | Admitting: *Deleted

## 2014-08-29 DIAGNOSIS — I482 Chronic atrial fibrillation, unspecified: Secondary | ICD-10-CM

## 2014-08-29 DIAGNOSIS — I4891 Unspecified atrial fibrillation: Secondary | ICD-10-CM | POA: Diagnosis not present

## 2014-08-29 DIAGNOSIS — Z5181 Encounter for therapeutic drug level monitoring: Secondary | ICD-10-CM | POA: Diagnosis not present

## 2014-08-29 LAB — POCT INR: INR: 2.4

## 2014-09-03 ENCOUNTER — Other Ambulatory Visit: Payer: Self-pay | Admitting: Internal Medicine

## 2014-09-03 ENCOUNTER — Ambulatory Visit
Admission: RE | Admit: 2014-09-03 | Discharge: 2014-09-03 | Disposition: A | Discharge status: Home / Self Care | Payer: Medicare Other | Source: Ambulatory Visit | Attending: Internal Medicine | Admitting: Internal Medicine

## 2014-09-03 DIAGNOSIS — R05 Cough: Secondary | ICD-10-CM | POA: Diagnosis not present

## 2014-09-03 DIAGNOSIS — R0781 Pleurodynia: Secondary | ICD-10-CM

## 2014-09-03 DIAGNOSIS — R0602 Shortness of breath: Secondary | ICD-10-CM | POA: Diagnosis not present

## 2014-09-28 ENCOUNTER — Ambulatory Visit (INDEPENDENT_AMBULATORY_CARE_PROVIDER_SITE_OTHER): Payer: Medicare Other | Admitting: *Deleted

## 2014-09-28 ENCOUNTER — Ambulatory Visit: Payer: Medicare Other | Admitting: Cardiology

## 2014-09-28 DIAGNOSIS — Z5181 Encounter for therapeutic drug level monitoring: Secondary | ICD-10-CM

## 2014-09-28 DIAGNOSIS — I482 Chronic atrial fibrillation, unspecified: Secondary | ICD-10-CM

## 2014-09-28 DIAGNOSIS — I4891 Unspecified atrial fibrillation: Secondary | ICD-10-CM | POA: Diagnosis not present

## 2014-09-28 LAB — POCT INR: INR: 2.2

## 2014-10-03 DIAGNOSIS — I10 Essential (primary) hypertension: Secondary | ICD-10-CM | POA: Diagnosis not present

## 2014-10-03 DIAGNOSIS — M109 Gout, unspecified: Secondary | ICD-10-CM | POA: Diagnosis not present

## 2014-10-03 DIAGNOSIS — N183 Chronic kidney disease, stage 3 (moderate): Secondary | ICD-10-CM | POA: Diagnosis not present

## 2014-10-03 DIAGNOSIS — D509 Iron deficiency anemia, unspecified: Secondary | ICD-10-CM | POA: Diagnosis not present

## 2014-10-03 DIAGNOSIS — J309 Allergic rhinitis, unspecified: Secondary | ICD-10-CM | POA: Diagnosis not present

## 2014-10-05 ENCOUNTER — Encounter: Payer: Self-pay | Admitting: *Deleted

## 2014-10-10 ENCOUNTER — Ambulatory Visit (INDEPENDENT_AMBULATORY_CARE_PROVIDER_SITE_OTHER): Payer: Medicare Other | Admitting: Cardiology

## 2014-10-10 ENCOUNTER — Encounter: Payer: Self-pay | Admitting: Cardiology

## 2014-10-10 VITALS — BP 140/80 | HR 54 | Ht 73.0 in | Wt 229.4 lb

## 2014-10-10 DIAGNOSIS — I480 Paroxysmal atrial fibrillation: Secondary | ICD-10-CM

## 2014-10-10 DIAGNOSIS — I10 Essential (primary) hypertension: Secondary | ICD-10-CM | POA: Diagnosis not present

## 2014-10-10 DIAGNOSIS — I77810 Thoracic aortic ectasia: Secondary | ICD-10-CM | POA: Diagnosis not present

## 2014-10-10 DIAGNOSIS — E669 Obesity, unspecified: Secondary | ICD-10-CM | POA: Diagnosis not present

## 2014-10-10 MED ORDER — LISINOPRIL 10 MG PO TABS: 10.0000 mg | ORAL_TABLET | Freq: Every day | ORAL | Status: DC

## 2014-10-10 NOTE — Patient Instructions (Signed)
Medication Instructions:  Please stop your Losartan and start Lisinopril 10 mg daily. Continue all other medications as listed.  Follow-Up: Follow up in 6 months with Dr. Anne Fu.  You will receive a letter in the mail 2 months before you are due.  Please call us when you receive this letter to schedule your follow up appointment.  Thank you for choosing [facility]!!

## 2014-10-10 NOTE — Progress Notes (Signed)
[address] [place], [place]  [address] Phone: [phone] Fax:  [phone]  Date:  10/10/2014   ID:  Ralph Sullivan, DOB 04/27/1946, MRN [id number]  PCP:  Lonzo Cloud Landry Mellow, MD   History of Present Illness: Ralph Sullivan is a 69 y.o. male (husband of Ralph Sullivan) who presented to me in June of 2014 with return of atrial fibrillation. Heart rate 79 on EKG with right bundle branch block, left anterior fascicular block, bifascicular block.  In 2009, he converted with amiodarone. Amiodarone was used because of moderate LVH seen on prior echocardiogram, normal EF. In 2011 went back in afib (off of amiodarone). He believes he has been in atrial fibrillation over the past 3 years. We have been maintaining rate control. He has not had any strokelike symptoms. He does battled with gout occasionally. No alcohol intake. No recent fevers. HR was slowed in NSR.  metoprolol and he is currently on 200 mg twice a day of metoprolol tartrate.  His heart rate seems to be fairly well controlled on EKG. He does state that when mowing the yard or performing physical activity he will get somewhat fatigued, diaphoretic at times.this is been a long-standing issue. Also feels some dizziness when bending over and getting up. This continues. No CP. On anticoagulation.   An echocardiogram 6/14 demonstrated aortic root size 4.5 cm with mild LVH, normal EF. On repeat 6/15 - 4.2cm  Spirometry was reassuring. Has lost weight.   He believes that since he started the losartan he's been having more lower extremity cramping and more shortness of breath. He would like to try something different.      Wt Readings from Last 3 Encounters:  10/10/14 229 lb 6.4 oz (104.055 kg)  04/18/14 226 lb (102.513 kg)  04/10/14 228 lb (103.42 kg)     Past Medical History  Diagnosis Date  . Obesity, unspecified 03/28/2013  . Atrial fibrillation 02/08/2013    CHADS-VAS - 2 (AGE, HTN). On anticoagulation.  Holter 6/14 - 2.3 sec pause. Avg HR 87. Rate control   . Chronic kidney disease, stage III (moderate) 03/28/2013  . Chronic anticoagulation 03/28/2013  . Hyperhidrosis 03/28/2013  . 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 Bifascicular block 03/28/2013    History reviewed. No pertinent past surgical history.  Current Outpatient Prescriptions  Medication Sig Dispense Refill  . BEE POLLEN PO Take by mouth daily.    . CHROMIUM-CINNAMON PO Take 200 mg by mouth.    . cloNIDine (CATAPRES) 0.2 MG tablet Take 0.2 mg by mouth daily.     . Coenzyme Q10 (CO Q10) 200 MG CAPS Take by mouth daily.    . fluticasone (FLONASE) 50 MCG/ACT nasal spray Place 2 sprays into both nostrils.    Marland Kitchen HYDROcodone-acetaminophen (NORCO) 7.5-325 MG per tablet Take 1 tablet by mouth every 6 (six) hours as needed for moderate pain.    Marland Kitchen loratadine (CLARITIN) 10 MG tablet Take 10 mg by mouth daily.    Marland Kitchen losartan (COZAAR) 50 MG tablet     . metoprolol (LOPRESSOR) 100 MG tablet Take 100 mg by mouth 2 (two) times daily. Pt takes 2 in AM and 2 in PM    . Omega-3 Fatty Acids (OMEGA 3 PO) Take by mouth.    . predniSONE (DELTASONE) 5 MG tablet Take 5 mg by mouth daily with breakfast.    . vitamin E 400  UNIT capsule Take 400 Units by mouth daily.    Marland Kitchen warfarin (COUMADIN) 5 MG tablet Take as directed by coumadin clinic 40 tablet 3   No current facility-administered medications for this visit.    Allergies:    Allergies  Allergen Reactions  . Sulfur     Social History:  The patient  reports that he quit smoking about 69 years ago. He does not have any smokeless tobacco history on file.   ROS:  Please see the history of present illness.   Denies any syncope, bleeding, orthopnea, PND   PHYSICAL EXAM: VS:  BP 140/80 mmHg  Pulse 54  Ht 6\' 1"  (1.854 m)  Wt 229 lb 6.4 oz (104.055 kg)  BMI 30.27 kg/m2 Well nourished, well developed, in no acute distress HEENT: normal Neck:  no JVD Cardiac:  normal S1, S2; irreg irreg; no murmur Lungs:  clear to auscultation bilaterally, no wheezing, rhonchi or rales Abd: soft, nontender, no hepatomegalyOverweight Ext: no edema Skin: warm and dry Neuro: no focal abnormalities noted  EKG:  04/10/14-right bundle branch block, atrial fibrillation, 79, left anterior fascicular block, bifascicular block-no change from prior Atrial fibrillation heart rate 69, right bundle branch block, left anterior specific block, bifascicular block     ASSESSMENT AND PLAN:  1. Atrial fibrillation-Holter monitor 6/14 demonstrated an average heart rate of 87 beats per minute, longest pause of 2.3 seconds. Overall good rate control. No changes made. Metoprolol 100 4 times a day, 2 in the morning, 2 in the evening. 2. Dyspnea/hypotension with exercise-overall reassuring nuclear stress test 04/19/2014. Most recent echo cardiac rhythm also reassuring. Continue to work on conditioning. 3. Dilated aortic root - 55mm stable. Echocardiogram reviewed 4. Chronic kidney disease stage III- Dr. Hyman Hopes. Avoid NSAIDs 5. Chronic anticoagulation-we are monitoring her in Coumadin clinic. No bleeding. No side effects. No strokelike symptoms.  6. Obesity-encouraging weight loss. He's done a great job with this. >20 pounds. Continue. 7. Hypertension-minimally elevated today. In fact, he can feel some hypotension with mowing the yard or exercising. Dr. Gust Brooms is currently adjusting medications, may need to pull back even more on his clonidine. He feels that since he has been on the losartan, he has had leg cramping and more shortness of breath. He would like to try something to from. I will place him on lisinopril 10 mg once a day. 8. Hyperhidrosis/sweating-TSH in the past has been normal. Continue to discuss with Dr. Gust Brooms.  9. Six-month follow-up  Signed, Donato Schultz, MD [facility]  10/10/2014 10:26 AM

## 2014-10-22 ENCOUNTER — Other Ambulatory Visit: Payer: Self-pay

## 2014-10-25 ENCOUNTER — Ambulatory Visit (INDEPENDENT_AMBULATORY_CARE_PROVIDER_SITE_OTHER): Payer: Medicare Other | Admitting: *Deleted

## 2014-10-25 DIAGNOSIS — I480 Paroxysmal atrial fibrillation: Secondary | ICD-10-CM

## 2014-10-25 DIAGNOSIS — Z5181 Encounter for therapeutic drug level monitoring: Secondary | ICD-10-CM

## 2014-10-25 DIAGNOSIS — I4891 Unspecified atrial fibrillation: Secondary | ICD-10-CM

## 2014-10-25 LAB — POCT INR: INR: 2.8

## 2014-11-28 ENCOUNTER — Ambulatory Visit (INDEPENDENT_AMBULATORY_CARE_PROVIDER_SITE_OTHER): Payer: Medicare Other | Admitting: Surgery

## 2014-11-28 DIAGNOSIS — I480 Paroxysmal atrial fibrillation: Secondary | ICD-10-CM | POA: Diagnosis not present

## 2014-11-28 DIAGNOSIS — I4891 Unspecified atrial fibrillation: Secondary | ICD-10-CM

## 2014-11-28 DIAGNOSIS — Z5181 Encounter for therapeutic drug level monitoring: Secondary | ICD-10-CM | POA: Diagnosis not present

## 2014-11-28 LAB — POCT INR: INR: 2.6

## 2014-12-15 ENCOUNTER — Other Ambulatory Visit: Payer: Self-pay | Admitting: Cardiology

## 2015-01-09 ENCOUNTER — Ambulatory Visit (INDEPENDENT_AMBULATORY_CARE_PROVIDER_SITE_OTHER): Payer: Medicare Other | Admitting: *Deleted

## 2015-01-09 DIAGNOSIS — Z5181 Encounter for therapeutic drug level monitoring: Secondary | ICD-10-CM

## 2015-01-09 DIAGNOSIS — I480 Paroxysmal atrial fibrillation: Secondary | ICD-10-CM

## 2015-01-09 DIAGNOSIS — I4891 Unspecified atrial fibrillation: Secondary | ICD-10-CM | POA: Diagnosis not present

## 2015-01-09 LAB — POCT INR: INR: 2.4

## 2015-02-14 DIAGNOSIS — H5203 Hypermetropia, bilateral: Secondary | ICD-10-CM | POA: Diagnosis not present

## 2015-02-14 DIAGNOSIS — H25813 Combined forms of age-related cataract, bilateral: Secondary | ICD-10-CM | POA: Diagnosis not present

## 2015-02-20 ENCOUNTER — Ambulatory Visit (INDEPENDENT_AMBULATORY_CARE_PROVIDER_SITE_OTHER): Payer: Medicare Other | Admitting: *Deleted

## 2015-02-20 DIAGNOSIS — I4891 Unspecified atrial fibrillation: Secondary | ICD-10-CM | POA: Diagnosis not present

## 2015-02-20 DIAGNOSIS — I480 Paroxysmal atrial fibrillation: Secondary | ICD-10-CM | POA: Diagnosis not present

## 2015-02-20 DIAGNOSIS — Z5181 Encounter for therapeutic drug level monitoring: Secondary | ICD-10-CM

## 2015-02-20 LAB — POCT INR: INR: 2.7

## 2015-04-02 DIAGNOSIS — F411 Generalized anxiety disorder: Secondary | ICD-10-CM | POA: Diagnosis not present

## 2015-04-02 DIAGNOSIS — Z125 Encounter for screening for malignant neoplasm of prostate: Secondary | ICD-10-CM | POA: Diagnosis not present

## 2015-04-02 DIAGNOSIS — I6930 Unspecified sequelae of cerebral infarction: Secondary | ICD-10-CM | POA: Diagnosis not present

## 2015-04-02 DIAGNOSIS — Z23 Encounter for immunization: Secondary | ICD-10-CM | POA: Diagnosis not present

## 2015-04-02 DIAGNOSIS — Z1211 Encounter for screening for malignant neoplasm of colon: Secondary | ICD-10-CM | POA: Diagnosis not present

## 2015-04-02 DIAGNOSIS — N183 Chronic kidney disease, stage 3 (moderate): Secondary | ICD-10-CM | POA: Diagnosis not present

## 2015-04-02 DIAGNOSIS — Z1389 Encounter for screening for other disorder: Secondary | ICD-10-CM | POA: Diagnosis not present

## 2015-04-02 DIAGNOSIS — I10 Essential (primary) hypertension: Secondary | ICD-10-CM | POA: Diagnosis not present

## 2015-04-02 DIAGNOSIS — Z0000 Encounter for general adult medical examination without abnormal findings: Secondary | ICD-10-CM | POA: Diagnosis not present

## 2015-04-02 DIAGNOSIS — L74519 Primary focal hyperhidrosis, unspecified: Secondary | ICD-10-CM | POA: Diagnosis not present

## 2015-04-02 DIAGNOSIS — M109 Gout, unspecified: Secondary | ICD-10-CM | POA: Diagnosis not present

## 2015-04-02 DIAGNOSIS — R682 Dry mouth, unspecified: Secondary | ICD-10-CM | POA: Diagnosis not present

## 2015-04-02 DIAGNOSIS — R0602 Shortness of breath: Secondary | ICD-10-CM | POA: Diagnosis not present

## 2015-04-02 DIAGNOSIS — I480 Paroxysmal atrial fibrillation: Secondary | ICD-10-CM | POA: Diagnosis not present

## 2015-04-04 DIAGNOSIS — R7309 Other abnormal glucose: Secondary | ICD-10-CM | POA: Diagnosis not present

## 2015-04-08 ENCOUNTER — Ambulatory Visit (INDEPENDENT_AMBULATORY_CARE_PROVIDER_SITE_OTHER): Payer: Medicare Other | Admitting: *Deleted

## 2015-04-08 ENCOUNTER — Ambulatory Visit: Payer: Medicare Other | Admitting: Cardiology

## 2015-04-08 DIAGNOSIS — I4891 Unspecified atrial fibrillation: Secondary | ICD-10-CM

## 2015-04-08 DIAGNOSIS — Z5181 Encounter for therapeutic drug level monitoring: Secondary | ICD-10-CM

## 2015-04-08 DIAGNOSIS — I480 Paroxysmal atrial fibrillation: Secondary | ICD-10-CM

## 2015-04-08 LAB — POCT INR: INR: 3

## 2015-04-17 ENCOUNTER — Encounter: Payer: Self-pay | Admitting: Cardiology

## 2015-05-02 ENCOUNTER — Ambulatory Visit (INDEPENDENT_AMBULATORY_CARE_PROVIDER_SITE_OTHER): Payer: Medicare Other | Admitting: Cardiology

## 2015-05-02 ENCOUNTER — Ambulatory Visit (INDEPENDENT_AMBULATORY_CARE_PROVIDER_SITE_OTHER): Payer: Medicare Other | Admitting: *Deleted

## 2015-05-02 ENCOUNTER — Encounter: Payer: Self-pay | Admitting: Cardiology

## 2015-05-02 VITALS — BP 150/100 | HR 89 | Ht 71.0 in | Wt 219.0 lb

## 2015-05-02 DIAGNOSIS — I4819 Other persistent atrial fibrillation: Secondary | ICD-10-CM

## 2015-05-02 DIAGNOSIS — N183 Chronic kidney disease, stage 3 unspecified: Secondary | ICD-10-CM

## 2015-05-02 DIAGNOSIS — Z7901 Long term (current) use of anticoagulants: Secondary | ICD-10-CM

## 2015-05-02 DIAGNOSIS — Z5181 Encounter for therapeutic drug level monitoring: Secondary | ICD-10-CM

## 2015-05-02 DIAGNOSIS — I77810 Thoracic aortic ectasia: Secondary | ICD-10-CM | POA: Diagnosis not present

## 2015-05-02 DIAGNOSIS — I481 Persistent atrial fibrillation: Secondary | ICD-10-CM

## 2015-05-02 DIAGNOSIS — I4891 Unspecified atrial fibrillation: Secondary | ICD-10-CM

## 2015-05-02 DIAGNOSIS — I739 Peripheral vascular disease, unspecified: Secondary | ICD-10-CM

## 2015-05-02 LAB — POCT INR: INR: 1.3

## 2015-05-02 NOTE — Patient Instructions (Signed)
Medication Instructions:  Please decrease your Clonidine to 1/2 tablet twice a day for 1 week then stop. Take your Lisinopril at night. Continue all other medications as listed.  Testing/Procedures: Your physician has requested that you have an echocardiogram. Echocardiography is a painless test that uses sound waves to create images of your heart. It provides your doctor with information about the size and shape of your heart and how well your heart's chambers and valves are working. This procedure takes approximately one hour. There are no restrictions for this procedure.  Your physician has requested that you have a lower extremity arterial exercise duplex. During this test, exercise and ultrasound are used to evaluate arterial blood flow in the legs. Allow one hour for this exam. There are no restrictions or special instructions.   Follow-Up: Follow up in 4 months with Dr Anne FuSkains.  Any Other Special Instructions Will Be Listed Below (If Applicable).  Thank you for choosing [facility]!!         If you need a refill on your cardiac medications before your next appointment, please call your pharmacy.

## 2015-05-02 NOTE — Progress Notes (Signed)
[address] [place], [place]  [address] Phone: [phone] Fax:  [phone]  Date:  05/02/2015   ID:  Ralph Sullivan, DOB 23-Jul-1945, MRN [id number]  PCP:  Lonzo Cloud Landry Mellow, MD   History of Present Illness: Ralph Sullivan is a 70 y.o. male (husband of Ralph Sullivan) who presented to me in June of 2014 with return of atrial fibrillation. Heart rate 79 on EKG with right bundle branch block, left anterior fascicular block, bifascicular block.  In 2009, he converted with amiodarone. Amiodarone was used because of moderate LVH seen on prior echocardiogram, normal EF. In 2011 went back in afib (off of amiodarone). He believes he has been in atrial fibrillation over the past 3 years. We have been maintaining rate control. He has not had any strokelike symptoms. He does battled with gout occasionally. No alcohol intake. No recent fevers. HR was slowed in NSR.  For the last few years however he has been in atrial fibrillation with good rate control.   His heart rate seems to be fairly well controlled on EKG. He does state that when mowing the yard or performing physical activity he will get somewhat fatigued, diaphoretic at times.this is been a long-standing issue. Also feels some dizziness when bending over and getting up. No CP. On anticoagulation.   An echocardiogram 6/14 demonstrated aortic root size 4.5 cm with mild LVH, normal EF. On repeat 6/15 - 4.2cm  Spirometry was reassuring. Has lost weight.    he has had a recent dental issues, needs a crown. It would not be unreasonable for him to hold his Coumadin if necessary.    Wt Readings from Last 3 Encounters:  05/02/15 219 lb (99.338 kg)  10/10/14 229 lb 6.4 oz (104.055 kg)  04/18/14 226 lb (102.513 kg)     Past Medical History  Diagnosis Date  . Obesity, unspecified 03/28/2013  . Atrial fibrillation (HCC) 02/08/2013    CHADS-VAS - 2 (AGE, HTN). On anticoagulation. Holter 6/14 - 2.3 sec pause. Avg HR 87.  Rate control   . Chronic kidney disease, stage III (moderate) 03/28/2013  . Chronic anticoagulation 03/28/2013  . Hyperhidrosis 03/28/2013  . Dilated aortic root (HCC) 03/28/2013    6/14-4.5cm sinus of Valsalva, 4.2 cm sinotubular junction  . Bifascicular block 03/28/2013    No past surgical history on file.  Current Outpatient Prescriptions  Medication Sig Dispense Refill  . CHROMIUM-CINNAMON PO Take 200 mg by mouth.    . cloNIDine (CATAPRES) 0.2 MG tablet Take 0.2 mg by mouth daily.     . Coenzyme Q10 (CO Q10) 200 MG CAPS Take by mouth daily.    . fluticasone (FLONASE) 50 MCG/ACT nasal spray Place 2 sprays into both nostrils.    Marland Kitchen HYDROcodone-acetaminophen (NORCO) 7.5-325 MG per tablet Take 1 tablet by mouth every 6 (six) hours as needed for moderate pain.    Marland Kitchen lisinopril (PRINIVIL,ZESTRIL) 10 MG tablet Take 1 tablet (10 mg total) by mouth daily. 90 tablet 3  . loratadine (CLARITIN) 10 MG tablet Take 10 mg by mouth daily.    . metoprolol (LOPRESSOR) 100 MG tablet Take 100 mg by mouth 2 (two) times daily. Pt takes 2 in AM and 2 in PM    . Omega-3 Fatty Acids (OMEGA 3 PO) Take by mouth.    . predniSONE (DELTASONE) 5 MG tablet Take 5 mg by mouth daily with breakfast.    . vitamin E 400 UNIT capsule Take 400 Units  by mouth daily.    Marland Kitchen. warfarin (COUMADIN) 5 MG tablet TAKE AS DIRECTED BY  COUMADIN  CLINIC. 40 tablet 3   No current facility-administered medications for this visit.    Allergies:    Allergies  Allergen Reactions  . Sulfur     Social History:  The patient  reports that he quit smoking about 70 years ago. He does not have any smokeless tobacco history on file.   ROS:  Please see the history of present illness.   Denies any syncope, bleeding, orthopnea, PND   PHYSICAL EXAM: VS:  BP 150/100 mmHg  Pulse 89  Ht 5\' 11"  (1.803 m)  Wt 219 lb (99.338 kg)  BMI 30.56 kg/m2 Well nourished, well developed, in no acute distress HEENT: normal Neck: no JVD Cardiac:  normal S1, S2;  irreg irreg; no murmur Lungs:  clear to auscultation bilaterally, no wheezing, rhonchi or rales Abd: soft, nontender, no hepatomegalyOverweight Ext: no edema Skin: warm and dry palpable distal pulses  Neuro: no focal abnormalities noted  EKG:   today 05/02/15-atrial fibrillation heart rate 88 bpm, right bundle branch block, left anterior fascicular block, bifascicular block. 04/10/14-right bundle branch block, atrial fibrillation, 79, left anterior fascicular block, bifascicular block-no change from prior Atrial fibrillation heart rate 69, right bundle branch block, left anterior specific block, bifascicular block     ASSESSMENT AND PLAN:  1. Atrial fibrillation persistent-Holter monitor 6/14 demonstrated an average heart rate of 87 beats per minute, longest pause of 2.3 seconds. Overall good rate control. No changes made. Metoprolol 100 , 2 in the morning, 2 in the evening 400 mg total . 2. Claudication/leg heaviness-1 walking noticing this bilaterally. We will check ABIs.  3. Dyspnea/hypotension with exercise-overall reassuring nuclear stress test 04/19/2014. Most recent echo cardiac rhythm also reassuring. Continue to work on conditioning.we are going to decrease his clonidine 0.1 mg twice a day for one week and then we will stop it . I've also asked him to take his lisinopril in the evening.  4. Dilated aortic root - 42mm stable. Echocardiogram reviewed, last echo 10/13/13-we will repeat.  5. Chronic kidney disease stage III- Dr. Hyman HopesWebb. Avoid NSAIDs 6. Chronic anticoagulation-we are monitoring her in Coumadin clinic. No bleeding. No side effects. No strokelike symptoms.  7. Obesity-encouraging weight loss. He's done a great job with this. >20 pounds. Continue. 8. Hypertension-tapering off clonidine. minimally elevated today but on repeat he was 140/80. At home he is in the 130s over 80s usually. At times after walking he can get down into the upper 90s. . In fact, he can feel some hypotension with  mowing the yard or exercising. Dr. Gust BroomsJaralla is currently adjusting medications, may need to pull back even more on his clonidine. He feels that since he has been on the losartan, he has had leg cramping and more shortness of breath. He would like to try something to from. I will place him on lisinopril 10 mg once a day. 9. Hyperhidrosis/sweating-TSH in the past has been normal. Continue to discuss with Dr. Gust BroomsJaralla.  10. 6161-month follow-up  Signed, Donato SchultzMark Jhaniya Briski, MD [facility]  05/02/2015 9:29 AM

## 2015-05-03 ENCOUNTER — Other Ambulatory Visit: Payer: Self-pay | Admitting: Cardiology

## 2015-05-03 DIAGNOSIS — I739 Peripheral vascular disease, unspecified: Secondary | ICD-10-CM

## 2015-05-10 ENCOUNTER — Ambulatory Visit (INDEPENDENT_AMBULATORY_CARE_PROVIDER_SITE_OTHER): Payer: Medicare Other | Admitting: *Deleted

## 2015-05-10 ENCOUNTER — Other Ambulatory Visit: Payer: Self-pay

## 2015-05-10 ENCOUNTER — Ambulatory Visit (HOSPITAL_COMMUNITY)
Admission: RE | Admit: 2015-05-10 | Discharge: 2015-05-10 | Disposition: A | Discharge status: Home / Self Care | Payer: Medicare Other | Source: Ambulatory Visit | Attending: Cardiology | Admitting: Cardiology

## 2015-05-10 ENCOUNTER — Ambulatory Visit (HOSPITAL_BASED_OUTPATIENT_CLINIC_OR_DEPARTMENT_OTHER): Payer: Medicare Other

## 2015-05-10 DIAGNOSIS — I481 Persistent atrial fibrillation: Secondary | ICD-10-CM

## 2015-05-10 DIAGNOSIS — E669 Obesity, unspecified: Secondary | ICD-10-CM | POA: Insufficient documentation

## 2015-05-10 DIAGNOSIS — Z5181 Encounter for therapeutic drug level monitoring: Secondary | ICD-10-CM

## 2015-05-10 DIAGNOSIS — I77810 Thoracic aortic ectasia: Secondary | ICD-10-CM | POA: Diagnosis not present

## 2015-05-10 DIAGNOSIS — I129 Hypertensive chronic kidney disease with stage 1 through stage 4 chronic kidney disease, or unspecified chronic kidney disease: Secondary | ICD-10-CM | POA: Insufficient documentation

## 2015-05-10 DIAGNOSIS — I4819 Other persistent atrial fibrillation: Secondary | ICD-10-CM

## 2015-05-10 DIAGNOSIS — I4891 Unspecified atrial fibrillation: Secondary | ICD-10-CM

## 2015-05-10 DIAGNOSIS — Z6830 Body mass index (BMI) 30.0-30.9, adult: Secondary | ICD-10-CM | POA: Diagnosis not present

## 2015-05-10 DIAGNOSIS — Z87891 Personal history of nicotine dependence: Secondary | ICD-10-CM | POA: Diagnosis not present

## 2015-05-10 DIAGNOSIS — I739 Peripheral vascular disease, unspecified: Secondary | ICD-10-CM | POA: Insufficient documentation

## 2015-05-10 DIAGNOSIS — I4510 Unspecified right bundle-branch block: Secondary | ICD-10-CM | POA: Insufficient documentation

## 2015-05-10 DIAGNOSIS — I517 Cardiomegaly: Secondary | ICD-10-CM | POA: Insufficient documentation

## 2015-05-10 DIAGNOSIS — N183 Chronic kidney disease, stage 3 (moderate): Secondary | ICD-10-CM | POA: Diagnosis not present

## 2015-05-10 LAB — POCT INR: INR: 2.3

## 2015-05-15 ENCOUNTER — Other Ambulatory Visit: Payer: Self-pay | Admitting: Cardiology

## 2015-05-21 DIAGNOSIS — Z1211 Encounter for screening for malignant neoplasm of colon: Secondary | ICD-10-CM | POA: Diagnosis not present

## 2015-05-24 ENCOUNTER — Ambulatory Visit (INDEPENDENT_AMBULATORY_CARE_PROVIDER_SITE_OTHER): Payer: Medicare Other | Admitting: *Deleted

## 2015-05-24 DIAGNOSIS — I4891 Unspecified atrial fibrillation: Secondary | ICD-10-CM | POA: Diagnosis not present

## 2015-05-24 DIAGNOSIS — I481 Persistent atrial fibrillation: Secondary | ICD-10-CM | POA: Diagnosis not present

## 2015-05-24 DIAGNOSIS — Z5181 Encounter for therapeutic drug level monitoring: Secondary | ICD-10-CM

## 2015-05-24 DIAGNOSIS — I4819 Other persistent atrial fibrillation: Secondary | ICD-10-CM

## 2015-05-24 LAB — POCT INR: INR: 1.5

## 2015-06-03 ENCOUNTER — Ambulatory Visit (INDEPENDENT_AMBULATORY_CARE_PROVIDER_SITE_OTHER): Payer: Medicare Other

## 2015-06-03 DIAGNOSIS — Z5181 Encounter for therapeutic drug level monitoring: Secondary | ICD-10-CM | POA: Diagnosis not present

## 2015-06-03 DIAGNOSIS — I481 Persistent atrial fibrillation: Secondary | ICD-10-CM | POA: Diagnosis not present

## 2015-06-03 DIAGNOSIS — I4819 Other persistent atrial fibrillation: Secondary | ICD-10-CM

## 2015-06-03 DIAGNOSIS — I4891 Unspecified atrial fibrillation: Secondary | ICD-10-CM | POA: Diagnosis not present

## 2015-06-03 LAB — POCT INR: INR: 2.3

## 2015-06-10 DIAGNOSIS — M25562 Pain in left knee: Secondary | ICD-10-CM | POA: Diagnosis not present

## 2015-06-10 DIAGNOSIS — J069 Acute upper respiratory infection, unspecified: Secondary | ICD-10-CM | POA: Diagnosis not present

## 2015-06-17 ENCOUNTER — Ambulatory Visit (INDEPENDENT_AMBULATORY_CARE_PROVIDER_SITE_OTHER): Payer: Medicare Other

## 2015-06-17 DIAGNOSIS — I4891 Unspecified atrial fibrillation: Secondary | ICD-10-CM | POA: Diagnosis not present

## 2015-06-17 DIAGNOSIS — Z5181 Encounter for therapeutic drug level monitoring: Secondary | ICD-10-CM | POA: Diagnosis not present

## 2015-06-17 DIAGNOSIS — I4819 Other persistent atrial fibrillation: Secondary | ICD-10-CM

## 2015-06-17 DIAGNOSIS — I481 Persistent atrial fibrillation: Secondary | ICD-10-CM | POA: Diagnosis not present

## 2015-06-17 LAB — POCT INR: INR: 3.2

## 2015-07-08 ENCOUNTER — Ambulatory Visit (INDEPENDENT_AMBULATORY_CARE_PROVIDER_SITE_OTHER): Payer: Medicare Other | Admitting: *Deleted

## 2015-07-08 DIAGNOSIS — I4891 Unspecified atrial fibrillation: Secondary | ICD-10-CM | POA: Diagnosis not present

## 2015-07-08 DIAGNOSIS — Z5181 Encounter for therapeutic drug level monitoring: Secondary | ICD-10-CM | POA: Diagnosis not present

## 2015-07-08 DIAGNOSIS — I481 Persistent atrial fibrillation: Secondary | ICD-10-CM

## 2015-07-08 DIAGNOSIS — I4819 Other persistent atrial fibrillation: Secondary | ICD-10-CM

## 2015-07-08 LAB — POCT INR: INR: 3.2

## 2015-07-09 DIAGNOSIS — N183 Chronic kidney disease, stage 3 (moderate): Secondary | ICD-10-CM | POA: Diagnosis not present

## 2015-07-09 DIAGNOSIS — N2581 Secondary hyperparathyroidism of renal origin: Secondary | ICD-10-CM | POA: Diagnosis not present

## 2015-07-09 DIAGNOSIS — E785 Hyperlipidemia, unspecified: Secondary | ICD-10-CM | POA: Diagnosis not present

## 2015-07-26 ENCOUNTER — Ambulatory Visit (INDEPENDENT_AMBULATORY_CARE_PROVIDER_SITE_OTHER): Payer: Medicare Other

## 2015-07-26 DIAGNOSIS — Z5181 Encounter for therapeutic drug level monitoring: Secondary | ICD-10-CM

## 2015-07-26 DIAGNOSIS — I4891 Unspecified atrial fibrillation: Secondary | ICD-10-CM

## 2015-07-26 DIAGNOSIS — I4819 Other persistent atrial fibrillation: Secondary | ICD-10-CM

## 2015-07-26 DIAGNOSIS — I481 Persistent atrial fibrillation: Secondary | ICD-10-CM | POA: Diagnosis not present

## 2015-07-26 LAB — POCT INR: INR: 2.7

## 2015-07-29 ENCOUNTER — Other Ambulatory Visit: Payer: Self-pay | Admitting: Cardiology

## 2015-08-21 ENCOUNTER — Ambulatory Visit (INDEPENDENT_AMBULATORY_CARE_PROVIDER_SITE_OTHER): Payer: Medicare Other | Admitting: *Deleted

## 2015-08-21 DIAGNOSIS — I481 Persistent atrial fibrillation: Secondary | ICD-10-CM

## 2015-08-21 DIAGNOSIS — I4891 Unspecified atrial fibrillation: Secondary | ICD-10-CM

## 2015-08-21 DIAGNOSIS — Z5181 Encounter for therapeutic drug level monitoring: Secondary | ICD-10-CM

## 2015-08-21 DIAGNOSIS — I4819 Other persistent atrial fibrillation: Secondary | ICD-10-CM

## 2015-08-21 LAB — POCT INR: INR: 4

## 2015-08-22 DIAGNOSIS — H524 Presbyopia: Secondary | ICD-10-CM | POA: Diagnosis not present

## 2015-08-22 DIAGNOSIS — H2513 Age-related nuclear cataract, bilateral: Secondary | ICD-10-CM | POA: Diagnosis not present

## 2015-09-15 NOTE — Progress Notes (Signed)
[address] [place], [place]  [id number] Phone: [phone] Fax:  [phone]  Date:  09/16/2015   ID:  Ralph Sullivan, DOB 04/05/1946, MRN [id number]  PCP:  Lonzo CloudShamleffer, Landry MellowIbethal JARALLA, MD   History of Present Illness: Ralph ScarceSammy K Syfert is a 70 y.o. male (husband of Ralph Sullivan) who presented to me in June of 2014 with return of atrial fibrillation. Heart rate 79 on EKG with right bundle branch block, left anterior fascicular block, bifascicular block.  In 2009, he converted with amiodarone. Amiodarone was used because of moderate LVH seen on prior echocardiogram, normal EF. In 2011 went back in afib (off of amiodarone). He believes he has been in atrial fibrillation over the past 3 years. We have been maintaining rate control. He has not had any strokelike symptoms. He does battled with gout occasionally. No alcohol intake. No recent fevers. HR was slowed in NSR.  For the last few years however he has been in atrial fibrillation with good rate control.   His heart rate seems to be fairly well controlled on EKG. He does state that when mowing the yard or performing physical activity he will get somewhat fatigued, diaphoretic at times.this is been a long-standing issue. Also feels some dizziness when bending over and getting up. No CP. On anticoagulation.   An echocardiogram 6/14 demonstrated aortic root size 4.5 cm with mild LVH, normal EF. On repeat 6/15 - 4.2cm  Spirometry was reassuring. Has lost weight.    he has had a recent dental issues, needs a crown. It would not be unreasonable for him to hold his Coumadin if necessary.  SOB weakness in legs, ABIs were normal. Worried about [facility]   Wt Readings from Last 3 Encounters:  09/16/15 217 lb (98.431 kg)  05/02/15 219 lb (99.338 kg)  10/10/14 229 lb 6.4 oz (104.055 kg)     Past Medical History  Diagnosis Date  . Obesity, unspecified 03/28/2013  . Atrial fibrillation (HCC) 02/08/2013    CHADS-VAS - 2 (AGE, HTN). On  anticoagulation. Holter 6/14 - 2.3 sec pause. Avg HR 87. Rate control   . Chronic kidney disease, stage III (moderate) 03/28/2013  . Chronic anticoagulation 03/28/2013  . Hyperhidrosis 03/28/2013  . Dilated aortic root (HCC) 03/28/2013    6/14-4.5cm sinus of Valsalva, 4.2 cm sinotubular junction  . Bifascicular block 03/28/2013    No past surgical history on file.  Current Outpatient Prescriptions  Medication Sig Dispense Refill  . CHROMIUM-CINNAMON PO Take 200 mg by mouth daily.     . cloNIDine (CATAPRES) 0.2 MG tablet Take 0.4 mg by mouth daily.     . Coenzyme Q10 (CO Q10) 200 MG CAPS Take 1 tablet by mouth daily.     . fluticasone (FLONASE) 50 MCG/ACT nasal spray Place 2 sprays into both nostrils daily.     Marland Kitchen. HYDROcodone-acetaminophen (NORCO) 7.5-325 MG per tablet Take 1 tablet by mouth every 6 (six) hours as needed for moderate pain.    Marland Kitchen. lisinopril (PRINIVIL,ZESTRIL) 10 MG tablet Take 1 tablet (10 mg total) by mouth daily. 90 tablet 3  . loratadine (CLARITIN) 10 MG tablet Take 10 mg by mouth daily.    . metoprolol (LOPRESSOR) 100 MG tablet Take 100 mg by mouth 2 (two) times daily.     . Omega-3 Fatty Acids (OMEGA 3 PO) Take 1 capsule by mouth daily.     . predniSONE (DELTASONE) 5 MG tablet Take 5 mg by mouth daily with  breakfast. Taking 2 tablets daily    . vitamin E 400 UNIT capsule Take 400 Units by mouth daily.    Marland Kitchen warfarin (COUMADIN) 5 MG tablet TAKE AS DIRECTED BY COUMADIN CLINIC 40 tablet 3   No current facility-administered medications for this visit.    Allergies:    Allergies  Allergen Reactions  . Sulfur Nausea Only    Social History:  The patient  reports that he quit smoking about 70 years ago. He does not have any smokeless tobacco history on file.   ROS:  Please see the history of present illness.   Denies any syncope, bleeding, orthopnea, PND   PHYSICAL EXAM: VS:  BP 142/88 mmHg  Pulse 72  Ht 5' 11.5" (1.816 m)  Wt 217 lb (98.431 kg)  BMI 29.85 kg/m2 Well  nourished, well developed, in no acute distress HEENT: normal Neck: no JVD Cardiac:  normal S1, S2; irreg irreg; no murmur Lungs:  clear to auscultation bilaterally, no wheezing, rhonchi or rales Abd: soft, nontender, no hepatomegalyOverweight Ext: no edema Skin: warm and dry palpable distal pulses  Neuro: no focal abnormalities noted  EKG:   today 05/02/15-atrial fibrillation heart rate 88 bpm, right bundle branch block, left anterior fascicular block, bifascicular block. 04/10/14-right bundle branch block, atrial fibrillation, 79, left anterior fascicular block, bifascicular block-no change from prior Atrial fibrillation heart rate 69, right bundle branch block, left anterior specific block, bifascicular block     ABIs 05/10/15-normal  ECHO 05/10/15: - Left ventricle: The cavity size was normal. There was mild  hypertrophy of the posterior wall and moderate hypertrophy of the  septal wall. Systolic function was at the lower limits of normal.  The estimated ejection fraction was in the range of 50% to 55%. - Left atrium: The atrium was severely dilated. - Pulmonary arteries: The main pulmonary artery was normal-sized.  Systolic pressure was within the normal range, estimated to be 28  mm Hg. - Aortic root-42 mm-stable - Inferior vena cava: The vessel was normal in size. The  respirophasic diameter changes were in the normal range (= 50%),  consistent with normal central venous pressure.  ASSESSMENT AND PLAN:  1. Atrial fibrillation persistent-Holter monitor 6/14 demonstrated an average heart rate of 87 beats per minute, longest pause of 2.3 seconds. Overall good rate control. No changes made. Metoprolol 100 , 2 in the morning, 2 in the evening 400 mg total . Seems to be stable. He notes that may be during the end of the day his heart rate is increased. 2. Claudication/leg heaviness-1 walking noticing this bilaterally. ABIs were excellent. Reassuring on 05/10/15    3. Dyspnea/hypotension with exercise-overall reassuring nuclear stress test 04/19/2014. Most recent echo cardiac rhythm also reassuring. Continue to work on conditioning.  4. Dilated aortic root - 42mm stable. Echocardiogram reviewed, last echo 10/13/13-we will repeat.  5. Chronic kidney disease stage III- Dr. Hyman Hopes. Avoid NSAIDs 6. Chronic anticoagulation-we are monitoring her in Coumadin clinic. No bleeding. No side effects. No strokelike symptoms.  7. Obesity-encouraging weight loss. He's done a great job with this. >20 pounds. Continue. 8. Hypertension- clonidine. minimally elevated today At home he is in the 130s over 80s usually. At times after walking he can get down into the upper 90s. . In fact, he can feel some hypotension with mowing the yard or exercising. Dr. Gust Brooms is currently adjusting medications, may need to pull back even more on his clonidine. At one point, he was on losartan but stated that he had more  leg cramps and more shortness of breath with this. I changed him over to lisinopril 10 mg which he takes at night. We will continue with this. 9. Hyperhidrosis/sweating-TSH in the past has been normal. Continue to discuss with PCP  10. 56-month follow-up  Signed, Donato Schultz, MD [facility]  09/16/2015 10:44 AM

## 2015-09-16 ENCOUNTER — Ambulatory Visit (INDEPENDENT_AMBULATORY_CARE_PROVIDER_SITE_OTHER): Payer: Medicare Other | Admitting: Cardiology

## 2015-09-16 ENCOUNTER — Ambulatory Visit (INDEPENDENT_AMBULATORY_CARE_PROVIDER_SITE_OTHER): Payer: Medicare Other | Admitting: Pharmacist

## 2015-09-16 ENCOUNTER — Encounter: Payer: Self-pay | Admitting: Cardiology

## 2015-09-16 VITALS — BP 142/88 | HR 72 | Ht 71.5 in | Wt 217.0 lb

## 2015-09-16 DIAGNOSIS — I481 Persistent atrial fibrillation: Secondary | ICD-10-CM

## 2015-09-16 DIAGNOSIS — I739 Peripheral vascular disease, unspecified: Secondary | ICD-10-CM

## 2015-09-16 DIAGNOSIS — I77810 Thoracic aortic ectasia: Secondary | ICD-10-CM

## 2015-09-16 DIAGNOSIS — E669 Obesity, unspecified: Secondary | ICD-10-CM

## 2015-09-16 DIAGNOSIS — Z5181 Encounter for therapeutic drug level monitoring: Secondary | ICD-10-CM

## 2015-09-16 DIAGNOSIS — I4819 Other persistent atrial fibrillation: Secondary | ICD-10-CM

## 2015-09-16 DIAGNOSIS — I10 Essential (primary) hypertension: Secondary | ICD-10-CM

## 2015-09-16 DIAGNOSIS — I4891 Unspecified atrial fibrillation: Secondary | ICD-10-CM | POA: Diagnosis not present

## 2015-09-16 LAB — POCT INR: INR: 4.2

## 2015-09-16 MED ORDER — LOSARTAN POTASSIUM 50 MG PO TABS: 50.0000 mg | ORAL_TABLET | Freq: Every day | ORAL | Status: DC

## 2015-09-16 NOTE — Patient Instructions (Addendum)

## 2015-09-26 DIAGNOSIS — M6282 Rhabdomyolysis: Secondary | ICD-10-CM

## 2015-09-26 HISTORY — DX: Rhabdomyolysis: M62.82

## 2015-09-30 ENCOUNTER — Ambulatory Visit (INDEPENDENT_AMBULATORY_CARE_PROVIDER_SITE_OTHER): Payer: Medicare Other | Admitting: *Deleted

## 2015-09-30 DIAGNOSIS — Z5181 Encounter for therapeutic drug level monitoring: Secondary | ICD-10-CM | POA: Diagnosis not present

## 2015-09-30 DIAGNOSIS — I4891 Unspecified atrial fibrillation: Secondary | ICD-10-CM | POA: Diagnosis not present

## 2015-09-30 DIAGNOSIS — I481 Persistent atrial fibrillation: Secondary | ICD-10-CM | POA: Diagnosis not present

## 2015-09-30 DIAGNOSIS — I4819 Other persistent atrial fibrillation: Secondary | ICD-10-CM

## 2015-09-30 LAB — POCT INR: INR: 2.5

## 2015-10-09 DIAGNOSIS — S43401A Unspecified sprain of right shoulder joint, initial encounter: Secondary | ICD-10-CM | POA: Diagnosis not present

## 2015-10-14 ENCOUNTER — Emergency Department (HOSPITAL_COMMUNITY): Payer: Medicare Other

## 2015-10-14 ENCOUNTER — Inpatient Hospital Stay (HOSPITAL_COMMUNITY): Payer: Medicare Other

## 2015-10-14 ENCOUNTER — Inpatient Hospital Stay (HOSPITAL_COMMUNITY)
Admission: EM | Admit: 2015-10-14 | Discharge: 2015-10-19 | DRG: 871 | Disposition: A | Discharge status: Home Health | Payer: Medicare Other | Attending: Internal Medicine | Admitting: Internal Medicine

## 2015-10-14 ENCOUNTER — Other Ambulatory Visit: Payer: Self-pay

## 2015-10-14 ENCOUNTER — Encounter (HOSPITAL_COMMUNITY): Payer: Self-pay | Admitting: Emergency Medicine

## 2015-10-14 DIAGNOSIS — M6282 Rhabdomyolysis: Secondary | ICD-10-CM | POA: Diagnosis present

## 2015-10-14 DIAGNOSIS — A4151 Sepsis due to Escherichia coli [E. coli]: Principal | ICD-10-CM | POA: Diagnosis present

## 2015-10-14 DIAGNOSIS — Z833 Family history of diabetes mellitus: Secondary | ICD-10-CM

## 2015-10-14 DIAGNOSIS — N170 Acute kidney failure with tubular necrosis: Secondary | ICD-10-CM | POA: Diagnosis present

## 2015-10-14 DIAGNOSIS — Y92000 Kitchen of unspecified non-institutional (private) residence as  the place of occurrence of the external cause: Secondary | ICD-10-CM

## 2015-10-14 DIAGNOSIS — M109 Gout, unspecified: Secondary | ICD-10-CM | POA: Diagnosis present

## 2015-10-14 DIAGNOSIS — I129 Hypertensive chronic kidney disease with stage 1 through stage 4 chronic kidney disease, or unspecified chronic kidney disease: Secondary | ICD-10-CM | POA: Diagnosis present

## 2015-10-14 DIAGNOSIS — Z8249 Family history of ischemic heart disease and other diseases of the circulatory system: Secondary | ICD-10-CM

## 2015-10-14 DIAGNOSIS — R410 Disorientation, unspecified: Secondary | ICD-10-CM

## 2015-10-14 DIAGNOSIS — I481 Persistent atrial fibrillation: Secondary | ICD-10-CM | POA: Diagnosis present

## 2015-10-14 DIAGNOSIS — A419 Sepsis, unspecified organism: Secondary | ICD-10-CM | POA: Diagnosis not present

## 2015-10-14 DIAGNOSIS — Z8673 Personal history of transient ischemic attack (TIA), and cerebral infarction without residual deficits: Secondary | ICD-10-CM

## 2015-10-14 DIAGNOSIS — N390 Urinary tract infection, site not specified: Secondary | ICD-10-CM | POA: Diagnosis present

## 2015-10-14 DIAGNOSIS — Z7951 Long term (current) use of inhaled steroids: Secondary | ICD-10-CM | POA: Diagnosis not present

## 2015-10-14 DIAGNOSIS — W19XXXA Unspecified fall, initial encounter: Secondary | ICD-10-CM | POA: Diagnosis present

## 2015-10-14 DIAGNOSIS — I480 Paroxysmal atrial fibrillation: Secondary | ICD-10-CM | POA: Diagnosis present

## 2015-10-14 DIAGNOSIS — Z882 Allergy status to sulfonamides status: Secondary | ICD-10-CM | POA: Diagnosis not present

## 2015-10-14 DIAGNOSIS — S0990XA Unspecified injury of head, initial encounter: Secondary | ICD-10-CM | POA: Diagnosis not present

## 2015-10-14 DIAGNOSIS — S4991XA Unspecified injury of right shoulder and upper arm, initial encounter: Secondary | ICD-10-CM | POA: Diagnosis not present

## 2015-10-14 DIAGNOSIS — Z7952 Long term (current) use of systemic steroids: Secondary | ICD-10-CM

## 2015-10-14 DIAGNOSIS — I10 Essential (primary) hypertension: Secondary | ICD-10-CM | POA: Diagnosis not present

## 2015-10-14 DIAGNOSIS — M545 Low back pain: Secondary | ICD-10-CM | POA: Diagnosis not present

## 2015-10-14 DIAGNOSIS — Z7901 Long term (current) use of anticoagulants: Secondary | ICD-10-CM

## 2015-10-14 DIAGNOSIS — M546 Pain in thoracic spine: Secondary | ICD-10-CM | POA: Diagnosis not present

## 2015-10-14 DIAGNOSIS — N183 Chronic kidney disease, stage 3 (moderate): Secondary | ICD-10-CM | POA: Diagnosis present

## 2015-10-14 DIAGNOSIS — Z6832 Body mass index (BMI) 32.0-32.9, adult: Secondary | ICD-10-CM | POA: Diagnosis not present

## 2015-10-14 DIAGNOSIS — D649 Anemia, unspecified: Secondary | ICD-10-CM | POA: Diagnosis present

## 2015-10-14 DIAGNOSIS — R404 Transient alteration of awareness: Secondary | ICD-10-CM | POA: Diagnosis not present

## 2015-10-14 DIAGNOSIS — E1122 Type 2 diabetes mellitus with diabetic chronic kidney disease: Secondary | ICD-10-CM | POA: Diagnosis present

## 2015-10-14 DIAGNOSIS — M25511 Pain in right shoulder: Secondary | ICD-10-CM | POA: Diagnosis present

## 2015-10-14 DIAGNOSIS — T796XXA Traumatic ischemia of muscle, initial encounter: Secondary | ICD-10-CM | POA: Diagnosis not present

## 2015-10-14 DIAGNOSIS — B9620 Unspecified Escherichia coli [E. coli] as the cause of diseases classified elsewhere: Secondary | ICD-10-CM | POA: Diagnosis present

## 2015-10-14 DIAGNOSIS — R61 Generalized hyperhidrosis: Secondary | ICD-10-CM | POA: Diagnosis present

## 2015-10-14 DIAGNOSIS — I4891 Unspecified atrial fibrillation: Secondary | ICD-10-CM | POA: Diagnosis not present

## 2015-10-14 DIAGNOSIS — R748 Abnormal levels of other serum enzymes: Secondary | ICD-10-CM

## 2015-10-14 DIAGNOSIS — R531 Weakness: Secondary | ICD-10-CM | POA: Diagnosis not present

## 2015-10-14 DIAGNOSIS — S199XXA Unspecified injury of neck, initial encounter: Secondary | ICD-10-CM | POA: Diagnosis not present

## 2015-10-14 DIAGNOSIS — S3992XA Unspecified injury of lower back, initial encounter: Secondary | ICD-10-CM | POA: Diagnosis not present

## 2015-10-14 DIAGNOSIS — N179 Acute kidney failure, unspecified: Secondary | ICD-10-CM | POA: Diagnosis present

## 2015-10-14 DIAGNOSIS — E669 Obesity, unspecified: Secondary | ICD-10-CM | POA: Diagnosis present

## 2015-10-14 DIAGNOSIS — Z87891 Personal history of nicotine dependence: Secondary | ICD-10-CM | POA: Diagnosis not present

## 2015-10-14 DIAGNOSIS — T796XXD Traumatic ischemia of muscle, subsequent encounter: Secondary | ICD-10-CM | POA: Diagnosis not present

## 2015-10-14 DIAGNOSIS — I4819 Other persistent atrial fibrillation: Secondary | ICD-10-CM | POA: Diagnosis present

## 2015-10-14 DIAGNOSIS — S299XXA Unspecified injury of thorax, initial encounter: Secondary | ICD-10-CM | POA: Diagnosis not present

## 2015-10-14 DIAGNOSIS — E860 Dehydration: Secondary | ICD-10-CM | POA: Diagnosis present

## 2015-10-14 HISTORY — DX: Other specified congenital deformities of feet: Q66.89

## 2015-10-14 HISTORY — DX: Cerebral infarction, unspecified: I63.9

## 2015-10-14 HISTORY — DX: Rhabdomyolysis: M62.82

## 2015-10-14 HISTORY — DX: Gout, unspecified: M10.9

## 2015-10-14 LAB — CBC WITH DIFFERENTIAL/PLATELET
BASOS PCT: 0 %
Basophils Absolute: 0 10*3/uL (ref 0.0–0.1)
EOS PCT: 0 %
Eosinophils Absolute: 0 10*3/uL (ref 0.0–0.7)
HEMATOCRIT: 37.3 % — AB (ref 39.0–52.0)
Hemoglobin: 10.7 g/dL — ABNORMAL LOW (ref 13.0–17.0)
LYMPHS PCT: 8 %
Lymphs Abs: 1 10*3/uL (ref 0.7–4.0)
MCH: 21.4 pg — ABNORMAL LOW (ref 26.0–34.0)
MCHC: 28.7 g/dL — ABNORMAL LOW (ref 30.0–36.0)
MCV: 74.5 fL — ABNORMAL LOW (ref 78.0–100.0)
MONO ABS: 0.9 10*3/uL (ref 0.1–1.0)
MONOS PCT: 7 %
NEUTROS PCT: 85 %
Neutro Abs: 10.8 10*3/uL — ABNORMAL HIGH (ref 1.7–7.7)
Platelets: 169 10*3/uL (ref 150–400)
RBC: 5.01 MIL/uL (ref 4.22–5.81)
RDW: 18.3 % — AB (ref 11.5–15.5)
WBC: 12.7 10*3/uL — AB (ref 4.0–10.5)

## 2015-10-14 LAB — COMPREHENSIVE METABOLIC PANEL
ALBUMIN: 2.9 g/dL — AB (ref 3.5–5.0)
ALT: 101 U/L — AB (ref 17–63)
AST: 278 U/L — AB (ref 15–41)
Alkaline Phosphatase: 122 U/L (ref 38–126)
Anion gap: 11 (ref 5–15)
BILIRUBIN TOTAL: 1.1 mg/dL (ref 0.3–1.2)
BUN: 45 mg/dL — AB (ref 6–20)
CHLORIDE: 111 mmol/L (ref 101–111)
CO2: 20 mmol/L — ABNORMAL LOW (ref 22–32)
CREATININE: 2.62 mg/dL — AB (ref 0.61–1.24)
Calcium: 9.2 mg/dL (ref 8.9–10.3)
GFR calc Af Amer: 27 mL/min — ABNORMAL LOW (ref >60)
GFR calc non Af Amer: 23 mL/min — ABNORMAL LOW (ref >60)
GLUCOSE: 136 mg/dL — AB (ref 65–99)
POTASSIUM: 4.7 mmol/L (ref 3.5–5.1)
Sodium: 142 mmol/L (ref 135–145)
Total Protein: 5.9 g/dL — ABNORMAL LOW (ref 6.5–8.1)

## 2015-10-14 LAB — URINALYSIS, ROUTINE W REFLEX MICROSCOPIC
Bilirubin Urine: NEGATIVE
Glucose, UA: NEGATIVE mg/dL
Ketones, ur: 15 mg/dL — AB
NITRITE: POSITIVE — AB
Protein, ur: 100 mg/dL — AB
SPECIFIC GRAVITY, URINE: 1.019 (ref 1.005–1.030)
pH: 5.5 (ref 5.0–8.0)

## 2015-10-14 LAB — URINE MICROSCOPIC-ADD ON

## 2015-10-14 LAB — CK: Total CK: 6146 U/L — ABNORMAL HIGH (ref 49–397)

## 2015-10-14 LAB — PROTIME-INR
INR: 1.29 (ref 0.00–1.49)
PROTHROMBIN TIME: 16.2 s — AB (ref 11.6–15.2)

## 2015-10-14 LAB — I-STAT CG4 LACTIC ACID, ED
Lactic Acid, Venous: 2.09 mmol/L (ref 0.5–2.0)
Lactic Acid, Venous: 2.52 mmol/L (ref 0.5–2.0)

## 2015-10-14 MED ORDER — WARFARIN - PHARMACIST DOSING INPATIENT: Freq: Every day | Status: DC

## 2015-10-14 MED ORDER — HYDROCODONE-ACETAMINOPHEN 5-325 MG PO TABS
1.0000 | ORAL_TABLET | Freq: Four times a day (QID) | ORAL | Status: DC | PRN
  Administered 2015-10-14 – 2015-10-16 (×4): 1 via ORAL
  Filled 2015-10-14 (×4): qty 1

## 2015-10-14 MED ORDER — WARFARIN SODIUM 7.5 MG PO TABS
7.5000 mg | ORAL_TABLET | ORAL | Status: AC
  Administered 2015-10-14: 7.5 mg via ORAL
  Filled 2015-10-14: qty 1

## 2015-10-14 MED ORDER — ONDANSETRON HCL 4 MG/2ML IJ SOLN: 4.0000 mg | Freq: Four times a day (QID) | INTRAMUSCULAR | Status: DC | PRN

## 2015-10-14 MED ORDER — PIPERACILLIN-TAZOBACTAM 3.375 G IVPB 30 MIN
3.3750 g | Freq: Once | INTRAVENOUS | Status: AC
  Administered 2015-10-14: 3.375 g via INTRAVENOUS
  Filled 2015-10-14: qty 50

## 2015-10-14 MED ORDER — SODIUM CHLORIDE 0.9 % IV SOLN
2000.0000 mg | Freq: Once | INTRAVENOUS | Status: AC
  Administered 2015-10-14: 2000 mg via INTRAVENOUS
  Filled 2015-10-14: qty 2000

## 2015-10-14 MED ORDER — SODIUM CHLORIDE 0.9 % IV SOLN
INTRAVENOUS | Status: AC
  Administered 2015-10-14: 20:00:00 via INTRAVENOUS

## 2015-10-14 MED ORDER — CLONIDINE HCL 0.2 MG PO TABS
0.2000 mg | ORAL_TABLET | Freq: Two times a day (BID) | ORAL | Status: DC
  Administered 2015-10-14 – 2015-10-15 (×2): 0.2 mg via ORAL
  Filled 2015-10-14 (×3): qty 1

## 2015-10-14 MED ORDER — SODIUM CHLORIDE 0.9 % IV BOLUS (SEPSIS)
1000.0000 mL | Freq: Once | INTRAVENOUS | Status: AC
  Administered 2015-10-14: 1000 mL via INTRAVENOUS

## 2015-10-14 MED ORDER — SODIUM CHLORIDE 0.9 % IV BOLUS (SEPSIS)
500.0000 mL | Freq: Once | INTRAVENOUS | Status: AC
  Administered 2015-10-14: 500 mL via INTRAVENOUS

## 2015-10-14 MED ORDER — PREDNISONE 10 MG PO TABS
10.0000 mg | ORAL_TABLET | Freq: Every day | ORAL | Status: DC
  Administered 2015-10-15 – 2015-10-19 (×5): 10 mg via ORAL
  Filled 2015-10-14 (×5): qty 1

## 2015-10-14 MED ORDER — ACETAMINOPHEN 325 MG PO TABS
650.0000 mg | ORAL_TABLET | ORAL | Status: DC | PRN
  Administered 2015-10-18 – 2015-10-19 (×2): 650 mg via ORAL
  Filled 2015-10-14 (×2): qty 2

## 2015-10-14 MED ORDER — SODIUM CHLORIDE 0.9 % IV SOLN
INTRAVENOUS | Status: DC
  Administered 2015-10-14: via INTRAVENOUS

## 2015-10-14 MED ORDER — DILTIAZEM HCL 100 MG IV SOLR
5.0000 mg/h | INTRAVENOUS | Status: DC
  Administered 2015-10-15 (×2): 5 mg/h via INTRAVENOUS
  Filled 2015-10-14 (×3): qty 100

## 2015-10-14 MED ORDER — DEXTROSE 5 % IV SOLN
1.0000 g | INTRAVENOUS | Status: DC
  Administered 2015-10-15: 1 g via INTRAVENOUS
  Filled 2015-10-14 (×2): qty 10

## 2015-10-14 NOTE — ED Notes (Signed)
Blood cultures drawn by phlebotomy.

## 2015-10-14 NOTE — ED Notes (Signed)
Patient transported to Ultrasound 
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
## 2015-10-14 NOTE — Progress Notes (Signed)
ANTICOAGULATION CONSULT NOTE - Initial Consult  Pharmacy Consult for Coumadin Indication: atrial fibrillation  Allergies  Allergen Reactions  . Duloxetine Other (See Comments)    More nervous  . Sulfur Nausea And Vomiting    Patient Measurements: Weight: 200 lb (90.719 kg)  Vital Signs: Temp: 97.7 F (36.5 C) (06/19 1819) Temp Source: Rectal (06/19 1603) BP: 155/91 mmHg (06/19 2115) Pulse Rate: 103 (06/19 1603)  Labs:  Recent Labs  10/14/15 1634  HGB 10.7*  HCT 37.3*  PLT 169  LABPROT 16.2*  INR 1.29  CREATININE 2.62*  CKTOTAL 6146*    Estimated Creatinine Clearance: 29.2 mL/min (by C-G formula based on Cr of 2.62).   Medical History: Past Medical History  Diagnosis Date  . Obesity, unspecified 03/28/2013  . Atrial fibrillation (HCC) 02/08/2013    CHADS-VAS - 2 (AGE, HTN). On anticoagulation. Holter 6/14 - 2.3 sec pause. Avg HR 87. Rate control   . Chronic kidney disease, stage III (moderate) 03/28/2013  . Chronic anticoagulation 03/28/2013  . Hyperhidrosis 03/28/2013  . Dilated aortic root (HCC) 03/28/2013    6/14-4.5cm sinus of Valsalva, 4.2 cm sinotubular junction  . Bifascicular block 03/28/2013  . Gout   . Bilateral congenital hammer toes   . 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 CHROMIUM-CINNAMON PO Take 200 mg by mouth at bedtime.    10/13/2015 at Unknown time  . cloNIDine (CATAPRES) 0.2 MG tablet Take 0.2 mg by mouth 2 (two) times daily.    10/13/2015 at Unknown time  . fluticasone (FLONASE) 50 MCG/ACT nasal spray Place 2 sprays into both nostrils daily.    10/13/2015 at Unknown time  . HYDROcodone-acetaminophen (NORCO/VICODIN) 5-325 MG tablet Take 1 tablet by mouth every 6 (six) hours as needed for moderate pain.    10/09/2015  . lisinopril (PRINIVIL,ZESTRIL) 10 MG tablet Take 1 tablet (10 mg total) by mouth daily. 90 tablet 3 10/13/2015 at  Unknown time  . loratadine (CLARITIN) 10 MG tablet Take 10 mg by mouth daily.   10/13/2015 at Unknown time  . metoprolol (LOPRESSOR) 100 MG tablet Take 200 mg by mouth 2 (two) times daily.    10/13/2015 at 1800  . Omega-3 Fatty Acids (OMEGA 3 PO) Take 1 capsule by mouth 2 (two) times daily.    10/13/2015 at Unknown time  . predniSONE (DELTASONE) 5 MG tablet Take 10 mg by mouth daily with breakfast.    10/13/2015 at Unknown time  . vitamin E 400 UNIT capsule Take 400 Units by mouth daily.   10/13/2015 at Unknown time  . warfarin (COUMADIN) 5 MG tablet TAKE AS DIRECTED BY COUMADIN CLINIC (Patient taking differently: TAKE 2.5 on Wednesdays.  5 mg all other days) 40 tablet 3 10/13/2015 at Unknown time    Assessment: 70 y.o. M presents s/p fall. Pt on coumadin PTA for afib. INR subtherapeutic (1.29) on admission. Hgb 10.7 on admission, plt wnl. Noted that pt's INR was therapeutic (2.5) at [facility]. Home dose: 5mg  daily except for 2.5mg  on Wed (last dose 6/18).  Goal of Therapy:  INR 2-3 Monitor platelets by anticoagulation protocol: Yes   Plan:  Coumadin 7.5mg  po now Daily INR  Christoper Fabianaron Latonia Conrow, PharmD, BCPS Clinical pharmacist, pager [phone] 10/14/2015,11:26 PM

## 2015-10-14 NOTE — ED Notes (Signed)
Attempted report 
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
## 2015-10-14 NOTE — ED Notes (Signed)
Admitting at bedside 

## 2015-10-14 NOTE — ED Notes (Signed)
Patient transported to CT 

## 2015-10-15 ENCOUNTER — Encounter (HOSPITAL_COMMUNITY): Payer: Self-pay | Admitting: General Practice

## 2015-10-15 ENCOUNTER — Inpatient Hospital Stay (HOSPITAL_COMMUNITY): Payer: Medicare Other

## 2015-10-15 DIAGNOSIS — A4151 Sepsis due to Escherichia coli [E. coli]: Principal | ICD-10-CM

## 2015-10-15 DIAGNOSIS — I4891 Unspecified atrial fibrillation: Secondary | ICD-10-CM

## 2015-10-15 LAB — BLOOD CULTURE ID PANEL (REFLEXED)
Acinetobacter baumannii: NOT DETECTED
CANDIDA ALBICANS: NOT DETECTED
CANDIDA GLABRATA: NOT DETECTED
CANDIDA TROPICALIS: NOT DETECTED
Candida krusei: NOT DETECTED
Candida parapsilosis: NOT DETECTED
Carbapenem resistance: NOT DETECTED
ENTEROBACTER CLOACAE COMPLEX: NOT DETECTED
ENTEROCOCCUS SPECIES: NOT DETECTED
Enterobacteriaceae species: DETECTED — AB
Escherichia coli: DETECTED — AB
Haemophilus influenzae: NOT DETECTED
KLEBSIELLA PNEUMONIAE: NOT DETECTED
Klebsiella oxytoca: NOT DETECTED
Listeria monocytogenes: NOT DETECTED
Methicillin resistance: NOT DETECTED
Neisseria meningitidis: NOT DETECTED
PROTEUS SPECIES: NOT DETECTED
Pseudomonas aeruginosa: NOT DETECTED
SERRATIA MARCESCENS: NOT DETECTED
STAPHYLOCOCCUS AUREUS BCID: NOT DETECTED
STREPTOCOCCUS PNEUMONIAE: NOT DETECTED
Staphylococcus species: NOT DETECTED
Streptococcus agalactiae: NOT DETECTED
Streptococcus pyogenes: NOT DETECTED
Streptococcus species: NOT DETECTED
VANCOMYCIN RESISTANCE: NOT DETECTED

## 2015-10-15 LAB — COMPREHENSIVE METABOLIC PANEL
ALT: 97 U/L — ABNORMAL HIGH (ref 17–63)
ANION GAP: 11 (ref 5–15)
AST: 220 U/L — ABNORMAL HIGH (ref 15–41)
Albumin: 2.2 g/dL — ABNORMAL LOW (ref 3.5–5.0)
Alkaline Phosphatase: 97 U/L (ref 38–126)
BUN: 40 mg/dL — ABNORMAL HIGH (ref 6–20)
CHLORIDE: 113 mmol/L — AB (ref 101–111)
CO2: 17 mmol/L — ABNORMAL LOW (ref 22–32)
CREATININE: 2.4 mg/dL — AB (ref 0.61–1.24)
Calcium: 8.1 mg/dL — ABNORMAL LOW (ref 8.9–10.3)
GFR, EST AFRICAN AMERICAN: 30 mL/min — AB (ref >60)
GFR, EST NON AFRICAN AMERICAN: 26 mL/min — AB (ref >60)
Glucose, Bld: 144 mg/dL — ABNORMAL HIGH (ref 65–99)
POTASSIUM: 4.3 mmol/L (ref 3.5–5.1)
SODIUM: 141 mmol/L (ref 135–145)
Total Bilirubin: 1.1 mg/dL (ref 0.3–1.2)
Total Protein: 4.9 g/dL — ABNORMAL LOW (ref 6.5–8.1)

## 2015-10-15 LAB — CBC
HCT: 31.2 % — ABNORMAL LOW (ref 39.0–52.0)
HEMOGLOBIN: 8.9 g/dL — AB (ref 13.0–17.0)
MCH: 21.3 pg — AB (ref 26.0–34.0)
MCHC: 28.5 g/dL — ABNORMAL LOW (ref 30.0–36.0)
MCV: 74.8 fL — ABNORMAL LOW (ref 78.0–100.0)
PLATELETS: 131 10*3/uL — AB (ref 150–400)
RBC: 4.17 MIL/uL — AB (ref 4.22–5.81)
RDW: 18.7 % — AB (ref 11.5–15.5)
WBC: 12.2 10*3/uL — AB (ref 4.0–10.5)

## 2015-10-15 LAB — ECHOCARDIOGRAM COMPLETE
FS: 35 % (ref 28–44)
Height: 68 in
IVS/LV PW RATIO, ED: 1.01
LA diam end sys: 40 mm
LA diam index: 1.93 cm/m2
LASIZE: 40 mm
LAVOL: 93 mL
LAVOLA4C: 95.7 mL
LAVOLIN: 44.9 mL/m2
LVOT area: 4.15 cm2
LVOTD: 23 mm
PW: 12.9 mm — AB (ref 0.6–1.1)
Reg peak vel: 276 cm/s
TAPSE: 19.3 mm
TR max vel: 276 cm/s
WEIGHTICAEL: 3297.6 [oz_av]

## 2015-10-15 LAB — PROTIME-INR
INR: 1.27 (ref 0.00–1.49)
PROTHROMBIN TIME: 16 s — AB (ref 11.6–15.2)

## 2015-10-15 LAB — CK TOTAL AND CKMB (NOT AT ARMC)
CK TOTAL: 3193 U/L — AB (ref 49–397)
CK, MB: 7.2 ng/mL — AB (ref 0.5–5.0)
Relative Index: 0.2 (ref 0.0–2.5)

## 2015-10-15 LAB — TROPONIN I
TROPONIN I: 1.53 ng/mL — AB (ref <0.031)
Troponin I: 1.13 ng/mL (ref <0.031)
Troponin I: 0.79 ng/mL (ref <0.031)

## 2015-10-15 LAB — LACTIC ACID, PLASMA
LACTIC ACID, VENOUS: 1.2 mmol/L (ref 0.5–2.0)
Lactic Acid, Venous: 1 mmol/L (ref 0.5–2.0)

## 2015-10-15 LAB — MRSA PCR SCREENING: MRSA BY PCR: NEGATIVE

## 2015-10-15 LAB — TSH: TSH: 0.645 u[IU]/mL (ref 0.350–4.500)

## 2015-10-15 MED ORDER — CLONIDINE HCL 0.1 MG PO TABS
0.1000 mg | ORAL_TABLET | Freq: Two times a day (BID) | ORAL | Status: DC
  Administered 2015-10-15 – 2015-10-18 (×6): 0.1 mg via ORAL
  Filled 2015-10-15 (×6): qty 1

## 2015-10-15 MED ORDER — SODIUM CHLORIDE 0.9 % IV SOLN
500.0000 mg | Freq: Three times a day (TID) | INTRAVENOUS | Status: DC
  Administered 2015-10-15 – 2015-10-17 (×6): 500 mg via INTRAVENOUS
  Filled 2015-10-15 (×8): qty 500

## 2015-10-15 MED ORDER — WARFARIN SODIUM 7.5 MG PO TABS
7.5000 mg | ORAL_TABLET | Freq: Once | ORAL | Status: AC
  Administered 2015-10-15: 7.5 mg via ORAL
  Filled 2015-10-15: qty 1

## 2015-10-15 MED ORDER — SODIUM CHLORIDE 0.9 % IV SOLN
INTRAVENOUS | Status: AC
  Administered 2015-10-15 – 2015-10-16 (×4): via INTRAVENOUS

## 2015-10-15 MED ORDER — METOPROLOL TARTRATE 50 MG PO TABS
75.0000 mg | ORAL_TABLET | Freq: Two times a day (BID) | ORAL | Status: DC
  Administered 2015-10-15 – 2015-10-17 (×5): 75 mg via ORAL
  Filled 2015-10-15 (×5): qty 1

## 2015-10-15 MED ORDER — DEXTROSE 5 % IV SOLN
2.0000 g | INTRAVENOUS | Status: DC
  Filled 2015-10-15: qty 2

## 2015-10-15 NOTE — Progress Notes (Addendum)
PHARMACY - PHYSICIAN COMMUNICATION CRITICAL VALUE ALERT - BLOOD CULTURE IDENTIFICATION (BCID)  Results for orders placed or performed during the hospital encounter of 10/14/15  Blood Culture ID Panel (Reflexed) (Collected: 10/14/2015  5:57 PM)  Result Value Ref Range   Enterococcus species NOT DETECTED NOT DETECTED   Vancomycin resistance NOT DETECTED NOT DETECTED   Listeria monocytogenes NOT DETECTED NOT DETECTED   Staphylococcus species NOT DETECTED NOT DETECTED   Staphylococcus aureus NOT DETECTED NOT DETECTED   Methicillin resistance NOT DETECTED NOT DETECTED   Streptococcus species NOT DETECTED NOT DETECTED   Streptococcus agalactiae NOT DETECTED NOT DETECTED   Streptococcus pneumoniae NOT DETECTED NOT DETECTED   Streptococcus pyogenes NOT DETECTED NOT DETECTED   Acinetobacter baumannii NOT DETECTED NOT DETECTED   Enterobacteriaceae species DETECTED (A) NOT DETECTED   Enterobacter cloacae complex NOT DETECTED NOT DETECTED   Escherichia coli DETECTED (A) NOT DETECTED   Klebsiella oxytoca NOT DETECTED NOT DETECTED   Klebsiella pneumoniae NOT DETECTED NOT DETECTED   Proteus species NOT DETECTED NOT DETECTED   Serratia marcescens NOT DETECTED NOT DETECTED   Carbapenem resistance NOT DETECTED NOT DETECTED   Haemophilus influenzae NOT DETECTED NOT DETECTED   Neisseria meningitidis NOT DETECTED NOT DETECTED   Pseudomonas aeruginosa NOT DETECTED NOT DETECTED   Candida albicans NOT DETECTED NOT DETECTED   Candida glabrata NOT DETECTED NOT DETECTED   Candida krusei NOT DETECTED NOT DETECTED   Candida parapsilosis NOT DETECTED NOT DETECTED   Candida tropicalis NOT DETECTED NOT DETECTED    Name of physician (or Provider) Contacted: Dr. York SpanielBuriev  Patient admitted 6/19 for fever/sepsis from UTI.  Blood culture positive this morning for GNR and BCID identified E.Coli.  He is already on ceftriaxone 1g IV q24.  No change in antibiotics is recommended at this time.  Will increase ceftriaxone  dose to 2g per protocol.  Changes to prescribed antibiotics required: Increase ceftriaxone to 2g IV q24.   Mickeal SkinnerFrens, Nayvie Lips John 10/15/2015  8:49 AM   Addendum:  Spoke with Dr. York SpanielBuriev - asked to change to Imipenem until susceptibilities are available.  Estimated creatinine clearance is ~32 mL/min.  Will start Imipenem 500mg  IV q8h.  Will continue to follow culture data and renal function.
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
## 2015-10-15 NOTE — Progress Notes (Signed)
[facility] responded to call from nurse stating the patient could use a visit from a Chaplain. The patients wife had recently passed (June 8) The patient had been confused earlier and was calling for his wife. He was much clearer at our meeting. I provided the ministry of listening as he spoke of his wife and their 45 years together. He mentioned that his wife's favorite song was "Count Your Blessings". It was his goal to continue to do that very thing. I offered a prayer of thanksgiving for all the blessing we receive and for the challenge of being a blessing to others. I will follow up as needed.  Lorne SkeensBenjamin T Lion Fernandez 10:21 AM    10/15/15 1000  Clinical Encounter Type  Visited With Patient  Visit Type Initial;Spiritual support  Referral From Nurse  Spiritual Encounters  Spiritual Needs Prayer;Emotional  Stress Factors  Patient Stress Factors Family relationships;Health changes;Loss;Major life changes

## 2015-10-15 NOTE — Progress Notes (Signed)
ANTICOAGULATION CONSULT NOTE Pharmacy Consult for Coumadin Indication: atrial fibrillation  Allergies  Allergen Reactions  . Duloxetine Other (See Comments)    More nervous  . Sulfur Nausea And Vomiting    Patient Measurements: Height: 5\' 8"  (172.7 cm) Weight: 206 lb 1.6 oz (93.486 kg) IBW/kg (Calculated) : 68.4  Vital Signs: Temp: 97.7 F (36.5 C) (06/20 0735) Temp Source: Oral (06/20 0735) BP: 131/87 mmHg (06/20 1152) Pulse Rate: 93 (06/20 1152)  Labs:  Recent Labs  10/14/15 1634 10/14/15 2336 10/15/15 0527 10/15/15 0958  HGB 10.7*  --  8.9*  --   HCT 37.3*  --  31.2*  --   PLT 169  --  131*  --   LABPROT 16.2*  --  16.0*  --   INR 1.29  --  1.27  --   CREATININE 2.62*  --  2.40*  --   CKTOTAL 6146*  --  3193*  --   CKMB  --   --  7.2*  --   TROPONINI  --  1.53* 1.13* 0.79*    Estimated Creatinine Clearance: 32.7 mL/min (by C-G formula based on Cr of 2.4).    Assessment: 70 y.o. M presents s/p fall. Pt on coumadin PTA for afib. INR subtherapeutic (1.29) on admission.  Noted that pt's INR was therapeutic (2.5) [facility]. Home dose: 5mg  daily except for 2.5mg  on Wed (last dose 6/18).  Goal of Therapy:  INR 2-3 Monitor platelets by anticoagulation protocol: Yes   Plan:  Coumadin 7.5mg  po x 1  Daily INR  Thank you Okey RegalLisa Bentlie Withem, PharmD [phone] 10/15/2015,1:05 PM

## 2015-10-15 NOTE — Progress Notes (Signed)
  Echocardiogram 2D Echocardiogram has been performed.  Arvil ChacoFoster, Marcey Persad 10/15/2015, 4:12 PM
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
## 2015-10-15 NOTE — Progress Notes (Signed)
Report received via Shanda BumpsJessica RN in patient's room using SBAR format, reviewed orders, labs, VS, tests, meds and patient's general condition, assumed care of patient.

## 2015-10-15 NOTE — Progress Notes (Signed)
CRITICAL VALUE ALERT  Critical value received:  Troponin 1.53  Date of notification:  6/19  Time of notification:  2340  Critical value read back:yes  Nurse who received alert:  Susann GivensMeredith Craven  MD notified (1st page): K Schorr   Time of first page:  0030   Responding MD: Merdis DelayK Schorr  Time MD responded:  0040  Will continue to monitor.

## 2015-10-16 ENCOUNTER — Other Ambulatory Visit: Payer: Self-pay

## 2015-10-16 DIAGNOSIS — I4891 Unspecified atrial fibrillation: Secondary | ICD-10-CM

## 2015-10-16 DIAGNOSIS — N179 Acute kidney failure, unspecified: Secondary | ICD-10-CM

## 2015-10-16 DIAGNOSIS — I10 Essential (primary) hypertension: Secondary | ICD-10-CM

## 2015-10-16 LAB — PROTIME-INR
INR: 1.56 — ABNORMAL HIGH (ref 0.00–1.49)
PROTHROMBIN TIME: 18.7 s — AB (ref 11.6–15.2)

## 2015-10-16 LAB — CBC
HCT: 28.5 % — ABNORMAL LOW (ref 39.0–52.0)
Hemoglobin: 8.1 g/dL — ABNORMAL LOW (ref 13.0–17.0)
MCH: 21.3 pg — AB (ref 26.0–34.0)
MCHC: 28.4 g/dL — AB (ref 30.0–36.0)
MCV: 74.8 fL — ABNORMAL LOW (ref 78.0–100.0)
PLATELETS: 114 10*3/uL — AB (ref 150–400)
RBC: 3.81 MIL/uL — ABNORMAL LOW (ref 4.22–5.81)
RDW: 19 % — AB (ref 11.5–15.5)
WBC: 10.6 10*3/uL — ABNORMAL HIGH (ref 4.0–10.5)

## 2015-10-16 LAB — BASIC METABOLIC PANEL
Anion gap: 5 (ref 5–15)
BUN: 28 mg/dL — AB (ref 6–20)
CO2: 20 mmol/L — ABNORMAL LOW (ref 22–32)
CREATININE: 1.58 mg/dL — AB (ref 0.61–1.24)
Calcium: 8 mg/dL — ABNORMAL LOW (ref 8.9–10.3)
Chloride: 114 mmol/L — ABNORMAL HIGH (ref 101–111)
GFR calc Af Amer: 50 mL/min — ABNORMAL LOW (ref >60)
GFR, EST NON AFRICAN AMERICAN: 43 mL/min — AB (ref >60)
Glucose, Bld: 182 mg/dL — ABNORMAL HIGH (ref 65–99)
Potassium: 4.1 mmol/L (ref 3.5–5.1)
SODIUM: 139 mmol/L (ref 135–145)

## 2015-10-16 LAB — HEPATITIS PANEL, ACUTE
HEP A IGM: NEGATIVE
HEP B C IGM: NEGATIVE
Hepatitis B Surface Ag: NEGATIVE

## 2015-10-16 LAB — HEMOGLOBIN A1C
HEMOGLOBIN A1C: 7 % — AB (ref 4.8–5.6)
MEAN PLASMA GLUCOSE: 154 mg/dL

## 2015-10-16 LAB — GLUCOSE, CAPILLARY: GLUCOSE-CAPILLARY: 169 mg/dL — AB (ref 65–99)

## 2015-10-16 MED ORDER — INSULIN ASPART 100 UNIT/ML ~~LOC~~ SOLN
0.0000 [IU] | Freq: Three times a day (TID) | SUBCUTANEOUS | Status: DC
  Administered 2015-10-17 (×2): 1 [IU] via SUBCUTANEOUS
  Administered 2015-10-18: 2 [IU] via SUBCUTANEOUS
  Administered 2015-10-18: 1 [IU] via SUBCUTANEOUS

## 2015-10-16 MED ORDER — METOPROLOL TARTRATE 5 MG/5ML IV SOLN
2.5000 mg | Freq: Once | INTRAVENOUS | Status: AC
  Administered 2015-10-16: 2.5 mg via INTRAVENOUS
  Filled 2015-10-16: qty 5

## 2015-10-16 MED ORDER — SODIUM CHLORIDE 0.9 % IV BOLUS (SEPSIS)
500.0000 mL | Freq: Once | INTRAVENOUS | Status: AC
  Administered 2015-10-16: 500 mL via INTRAVENOUS

## 2015-10-16 MED ORDER — WARFARIN SODIUM 5 MG PO TABS
5.0000 mg | ORAL_TABLET | Freq: Once | ORAL | Status: AC
Start: 2015-10-16 — End: 2015-10-16
  Administered 2015-10-16: 5 mg via ORAL
  Filled 2015-10-16: qty 1

## 2015-10-16 NOTE — Progress Notes (Signed)
Orders received to give 500cc NS bolus over 2 hours. Patient still hasn't voided but wanted to during this episode and instructed him that it wasn't safe to let him sit on a BSC with a HR and B/P that elevated and he became a little frustrated but was compliant, [facility]

## 2015-10-16 NOTE — Progress Notes (Signed)
PROGRESS NOTE    Ralph Sullivan  ZOX:[id number] DOB: 1945/08/27 DOA: 10/14/2015 PCP: Tommy Rainwater, MD   Brief Narrative:  70 y.o. Male with PMH of HTN, PAF on coumadin, Gout (on chronic prednisone), CVA, CKD,  whose wife passed away 3 weeks ago, apparently fell in the kitchen and was unable to get up. Larey Seat about 9pm Sunday. Admitted with AKI, rhabdomyolysis and  sepsis due to UTI, found to have GNR bacteremia.    Assessment & Plan:   Active Problems:   Persistent atrial fibrillation (HCC)   HTN (hypertension)   Rhabdomyolysis   Acute renal failure (ARF) (HCC)   Anemia   UTI (lower urinary tract infection)   Atrial fibrillation with rapid ventricular response (HCC)   1. Sepsis due to UTI, bacteremia:  Growing E COLI. Patient is started on ceftriaxone on admission. He has h/o UTIs, and chronically on prednisone (presumed immunosuppressed).  Sensitivities are pending.   Repeat blood cultures are pending. Resume IV fluids.  Lactic acid normalized.  2. AKI likely ATN in the setting of sepsis. Also dehydration.  Renal US: no hydronephrosis. We will cont IVF, monitor I/O, urine output. Hold lisinopril  . Repeat renal parameters in am.   3. Positive troponins likely in the setting of sepsis+AKI. No acute cardiopulmonary symptoms. denies chest pains. Trop is trending down (1.5->1.1). Cont ASA, BB, statins.  -elevated ast likely due to muscle damage  .  4. A fib RVR on admission. Weaned IV Cardizem. Cont oral BB. Resume coumadin per pharmacy . Sub therapeutic INR.   5. HTN. Better controlled.  6. Gout. No acute symptoms. ?Chronically  on steroids  7. Rhabdomyolysis: Possibly from sepsis.  Repeat in am.   8. Diabetes Mellitus: CBG (last 3)  No results for input(s): GLUCAP in the last 72 hours. ? New diagnosis.  Diabetes education co ordinator consult.  hgba1c is 7.     DVT prophylaxis: coumadin  Code Status: full Family Communication: no family at the  bedside Disposition Plan: pending PT evaluation.  Discharge when repeat blood cultures are negative.    Consultants:   none  Procedures: Echo shows LVEF of 65%, wall motion was normal, no regional wall abnormalities.    Antimicrobials:  Ceftriaxone 6/19 Imipenem 6/20>>     Subjective: Wants to know when he can go home.   Objective: Filed Vitals:   10/15/15 2321 10/16/15 0359 10/16/15 1011 10/16/15 1300  BP: 131/100 131/94 148/89 113/63  Pulse: 92 91 104 98  Temp: 97.8 F (36.6 C) 98.3 F (36.8 C)  98.2 F (36.8 C)  TempSrc: Oral Oral  Oral  Resp: 23 12  18   Height:      Weight:  96.843 kg (213 lb 8 oz)    SpO2: 99% 98%  100%    Intake/Output Summary (Last 24 hours) at 10/16/15 1746 Last data filed at 10/16/15 0900  Gross per 24 hour  Intake   2800 ml  Output   1175 ml  Net   1625 ml   Filed Weights   10/14/15 2329 10/15/15 0400 10/16/15 0359  Weight: 90.674 kg (199 lb 14.4 oz) 93.486 kg (206 lb 1.6 oz) 96.843 kg (213 lb 8 oz)    Examination:  General exam: Appears calm and comfortable  Respiratory system: Clear to auscultation. Respiratory effort normal. Cardiovascular system: S1 & S2 heard, RRR. No JVD, murmurs, rubs, gallops or clicks. No pedal edema. Gastrointestinal system: Abdomen is nondistended, soft and nontender. No organomegaly or masses felt. Normal  bowel sounds heard. Central nervous system: Alert and oriented. No focal neurological deficits. Extremities: Symmetric 5 x 5 power. Skin: No rashes, lesions or ulcers Psychiatry: Judgement and insight appear normal. Mood & affect appropriate.     Data Reviewed: I have personally reviewed following labs and imaging studies  CBC:  Recent Labs Lab 10/14/15 1634 10/15/15 0527 10/16/15 0430  WBC 12.7* 12.2* 10.6*  NEUTROABS 10.8*  --   --   HGB 10.7* 8.9* 8.1*  HCT 37.3* 31.2* 28.5*  MCV 74.5* 74.8* 74.8*  PLT 169 131* 114*   Basic Metabolic Panel:  Recent Labs Lab 10/14/15 1634  10/15/15 0527 10/16/15 0430  NA 142 141 139  K 4.7 4.3 4.1  CL 111 113* 114*  CO2 20* 17* 20*  GLUCOSE 136* 144* 182*  BUN 45* 40* 28*  CREATININE 2.62* 2.40* 1.58*  CALCIUM 9.2 8.1* 8.0*   GFR: Estimated Creatinine Clearance: 50.5 mL/min (by C-G formula based on Cr of 1.58). Liver Function Tests:  Recent Labs Lab 10/14/15 1634 10/15/15 0527  AST 278* 220*  ALT 101* 97*  ALKPHOS 122 97  BILITOT 1.1 1.1  PROT 5.9* 4.9*  ALBUMIN 2.9* 2.2*   No results for input(s): LIPASE, AMYLASE in the last 168 hours. No results for input(s): AMMONIA in the last 168 hours. Coagulation Profile:  Recent Labs Lab 10/14/15 1634 10/15/15 0527 10/16/15 0430  INR 1.29 1.27 1.56*   Cardiac Enzymes:  Recent Labs Lab 10/14/15 1634 10/14/15 2336 10/15/15 0527 10/15/15 0958  CKTOTAL 6146*  --  3193*  --   CKMB  --   --  7.2*  --   TROPONINI  --  1.53* 1.13* 0.79*   BNP (last 3 results) No results for input(s): PROBNP in the last 8760 hours. HbA1C:  Recent Labs  10/15/15 0527  HGBA1C 7.0*   CBG: No results for input(s): GLUCAP in the last 168 hours. Lipid Profile: No results for input(s): CHOL, HDL, LDLCALC, TRIG, CHOLHDL, LDLDIRECT in the last 72 hours. Thyroid Function Tests:  Recent Labs  10/15/15 0527  TSH 0.645   Anemia Panel: No results for input(s): VITAMINB12, FOLATE, FERRITIN, TIBC, IRON, RETICCTPCT in the last 72 hours. Sepsis Labs:  Recent Labs Lab 10/14/15 1657 10/14/15 1951 10/15/15 1000 10/15/15 1125  LATICACIDVEN 2.09* 2.52* 1.2 1.0    Recent Results (from the past 240 hour(s))  Blood Culture (routine x 2)     Status: Abnormal (Preliminary result)   Collection Time: 10/14/15  5:57 PM  Result Value Ref Range Status   Specimen Description BLOOD LEFT ANTECUBITAL  Final   Special Requests BOTTLES DRAWN AEROBIC AND ANAEROBIC 5CC  Final   Culture  Setup Time   Final    GRAM NEGATIVE RODS IN BOTH AEROBIC AND ANAEROBIC BOTTLES Organism ID to  follow CRITICAL RESULT CALLED TO, READ BACK BY AND VERIFIED WITH: J. Sharene Butters D AT 0844 ON [id number] BY Lucienne Capers    Culture ESCHERICHIA COLI SUSCEPTIBILITIES TO FOLLOW  (A)  Final   Report Status PENDING  Incomplete  Blood Culture ID Panel (Reflexed)     Status: Abnormal   Collection Time: 10/14/15  5:57 PM  Result Value Ref Range Status   Enterococcus species NOT DETECTED NOT DETECTED Final   Vancomycin resistance NOT DETECTED NOT DETECTED Final   Listeria monocytogenes NOT DETECTED NOT DETECTED Final   Staphylococcus species NOT DETECTED NOT DETECTED Final   Staphylococcus aureus NOT DETECTED NOT DETECTED Final   Methicillin resistance NOT DETECTED NOT  DETECTED Final   Streptococcus species NOT DETECTED NOT DETECTED Final   Streptococcus agalactiae NOT DETECTED NOT DETECTED Final   Streptococcus pneumoniae NOT DETECTED NOT DETECTED Final   Streptococcus pyogenes NOT DETECTED NOT DETECTED Final   Acinetobacter baumannii NOT DETECTED NOT DETECTED Final   Enterobacteriaceae species DETECTED (A) NOT DETECTED Final    Comment: CRITICAL RESULT CALLED TO, READ BACK BY AND VERIFIED WITH: J. FRENS, PHARM D AT 0844 ON [id number] BY S. YARBROUGH    Enterobacter cloacae complex NOT DETECTED NOT DETECTED Final   Escherichia coli DETECTED (A) NOT DETECTED Final    Comment: CRITICAL RESULT CALLED TO, READ BACK BY AND VERIFIED WITH: J. Sharene Butters D AT 6606 ON [id number] BY S. YARBROUGH    Klebsiella oxytoca NOT DETECTED NOT DETECTED Final   Klebsiella pneumoniae NOT DETECTED NOT DETECTED Final   Proteus species NOT DETECTED NOT DETECTED Final   Serratia marcescens NOT DETECTED NOT DETECTED Final   Carbapenem resistance NOT DETECTED NOT DETECTED Final   Haemophilus influenzae NOT DETECTED NOT DETECTED Final   Neisseria meningitidis NOT DETECTED NOT DETECTED Final   Pseudomonas aeruginosa NOT DETECTED NOT DETECTED Final   Candida albicans NOT DETECTED NOT DETECTED Final   Candida glabrata NOT  DETECTED NOT DETECTED Final   Candida krusei NOT DETECTED NOT DETECTED Final   Candida parapsilosis NOT DETECTED NOT DETECTED Final   Candida tropicalis NOT DETECTED NOT DETECTED Final  Blood Culture (routine x 2)     Status: None (Preliminary result)   Collection Time: 10/14/15  6:00 PM  Result Value Ref Range Status   Specimen Description BLOOD WRIST RIGHT  Final   Special Requests IN PEDIATRIC BOTTLE 2CC  Final   Culture  Setup Time GRAM NEGATIVE RODS AEROBIC BOTTLE ONLY   Final   Culture GRAM NEGATIVE RODS IDENTIFICATION TO FOLLOW   Final   Report Status PENDING  Incomplete  Urine culture     Status: Abnormal (Preliminary result)   Collection Time: 10/14/15  8:20 PM  Result Value Ref Range Status   Specimen Description URINE, CLEAN CATCH  Final   Special Requests NONE  Final   Culture >=100,000 COLONIES/mL ESCHERICHIA COLI (A)  Final   Report Status PENDING  Incomplete  MRSA PCR Screening     Status: None   Collection Time: 10/14/15 11:45 PM  Result Value Ref Range Status   MRSA by PCR NEGATIVE NEGATIVE Final    Comment:        The GeneXpert MRSA Assay (FDA approved for NASAL specimens only), is one component of a comprehensive MRSA colonization surveillance program. It is not intended to diagnose MRSA infection nor to guide or monitor treatment for MRSA infections.          Radiology Studies: US Abdomen Complete  10/14/2015  CLINICAL DATA:  Acute renal failure and elevated liver function test. History of chronic kidney disease, and hypertension. EXAM: ABDOMEN ULTRASOUND COMPLETE COMPARISON:  Abdominal aortic ultrasound February 02, 2013 FINDINGS: Gallbladder: Echogenic layering gallbladder sludge. No gallbladder wall thickening or pericholecystic fluid. No sonographic Murphy's sign elicited. Common bile duct: Diameter: 4 mm Liver: No focal lesion identified. Within normal limits in parenchymal echogenicity. Hepatopetal portal vein. IVC: No abnormality visualized.  Pancreas: Visualized portions are normal in appearance. Spleen: Size and appearance within normal limits. Right Kidney: Length: 10.2 cm. Echogenicity within normal limits. No solid mass or hydronephrosis visualized. Mild cortical atrophy. At least 2 anechoic cysts measuring up to 2.3 cm with acoustic enhancement. Left Kidney:  Length: 11 cm. Echogenicity within normal limits. No solid mass or hydronephrosis visualized. Mild cortical atrophy. At least 2 anechoic cysts with increased through transmission measure up to 2.6 cm. Abdominal aorta: Proximal aorta is 3 cm in transaxial dimension, mid aorta is 2.5 cm, distal aorta is 2.5 cm. Other findings: None. IMPRESSION: Symmetric renal cortical atrophy without obstructive uropathy. Small amount of gallbladder sludge without sonographic findings of acute cholecystitis. Electronically Signed   By: Awilda Metroourtnay  Bloomer M.D.   On: 10/14/2015 23:02        Scheduled Meds: . cloNIDine  0.1 mg Oral BID  . imipenem-cilastatin  500 mg Intravenous Q8H  . metoprolol tartrate  75 mg Oral BID  . predniSONE  10 mg Oral Q breakfast  . [COMPLETED] warfarin  5 mg Oral ONCE-1800  . Warfarin - Pharmacist Dosing Inpatient   Does not apply q1800   Continuous Infusions: . sodium chloride 175 mL/hr at 10/16/15 0431     LOS: 2 days    Time spent: >25 minutes     Reice Bienvenue, MD [facility] Pager [phone]  If 7PM-7AM, please contact night-coverage [email or web address] Password [email or web address] 10/16/2015, 5:46 PM

## 2015-10-16 NOTE — Care Management Important Message (Signed)
Important Message  Patient Details  Name: Ralph Sullivan MRN: [id number] Date of Birth: 12/07/1945   Medicare Important Message Given:  Yes    Bernadette HoitShoffner, Kery Batzel Coleman 10/16/2015, 1:48 PM

## 2015-10-16 NOTE — Progress Notes (Signed)
OT Cancellation    10/16/15 1405  OT Visit Information  Last OT Received On 10/16/15  Reason Eval/Treat Not Completed  Other (comment);Patient at procedure or test/ unavailable - pt talking with SW. Will attempt to return.   [facility] Centerilary Junaid Wurzer, OTR/L  [phone] 10/16/2015

## 2015-10-16 NOTE — Progress Notes (Signed)
Patient's HR up 120-160's with elevated B/P 160-180's/110-120's and sats were mid 90's on RA. Patient was a little diaphoretic otherwise asymptomatic, got him back to bed, placed him on 2L O2 via N/C, got an EKG, gave his Lopressor and Clonidine a little early and text paged [facility], awaiting a call back. Patient is a little teary eyed at this time but reassured him that we were taking care of him and that the MD was made aware and would either place orders or call me, will continue to monitor.

## 2015-10-16 NOTE — Discharge Instructions (Signed)

## 2015-10-16 NOTE — Progress Notes (Signed)
Updated report received in patient's room via Rey Rn using [employer] Homes[employer]AR format, updated on today's events, assumed care of patient.

## 2015-10-16 NOTE — Clinical Social Work Note (Signed)
Clinical Social Worker met with patient at bedside and spoke at length regarding patient home situation and plans at discharge.  Patient states that his wife of 106 years passed away in 2S on 23-Oct-2022 of this year.  Patient states that they did not have any children and he has been grieving at home.  Patient with good family/friend/church family support.  Patient adamantly states that he will not go to a SNF but is agreeable to home health in the home - [facility] notified.  Patient with very logical thought and understanding during conversation and became tearful regarding his wife and the need for additional help at home.  Patient is understanding that return home may not be the most beneficial for his physical recovery, however feels that emotionally he will handle things better at home.  CSW offered support and understanding to patient decisions and offered to contact Pastoral Care - patient states that they are already involved and he is very appreciative.    Clinical Social Worker will sign off for now as social work intervention is no longer needed. Please consult [place] again if new need arises.  Barbette Or, [place]

## 2015-10-16 NOTE — Progress Notes (Signed)
Orders received for 2.5 mg IV Lopressor, HR 100-120 and B/P remains 150-160's/100-120's, MD aware of EKG and VS, will continue to monitor, will also give some Vicodan for c/o right rotator cuff pain rated 5-6/10, elevated arm on a pillow, will continue to monitor.

## 2015-10-16 NOTE — Progress Notes (Signed)
ANTICOAGULATION CONSULT NOTE Pharmacy Consult for Coumadin Indication: atrial fibrillation  Allergies  Allergen Reactions  . Duloxetine Other (See Comments)    More nervous  . Sulfur Nausea And Vomiting    Patient Measurements: Height: 5\' 8"  (172.7 cm) Weight: 213 lb 8 oz (96.843 kg) (too tired to stand) IBW/kg (Calculated) : 68.4  Vital Signs: Temp: 98.3 F (36.8 C) (06/21 0359) Temp Source: Oral (06/21 0359) BP: 148/89 mmHg (06/21 1011) Pulse Rate: 104 (06/21 1011)  Labs:  Recent Labs  10/14/15 1634 10/14/15 2336 10/15/15 0527 10/15/15 0958 10/16/15 0430  HGB 10.7*  --  8.9*  --  8.1*  HCT 37.3*  --  31.2*  --  28.5*  PLT 169  --  131*  --  114*  LABPROT 16.2*  --  16.0*  --  18.7*  INR 1.29  --  1.27  --  1.56*  CREATININE 2.62*  --  2.40*  --  1.58*  CKTOTAL 6146*  --  3193*  --   --   CKMB  --   --  7.2*  --   --   TROPONINI  --  1.53* 1.13* 0.79*  --     Estimated Creatinine Clearance: 50.5 mL/min (by C-G formula based on Cr of 1.58).    Assessment: 70 y.o. M presents s/p fall. Pt on coumadin PTA for afib. INR subtherapeutic (1.29) on admission. [facility]  PTA dose: warfarin 5mg  daily except for 2.5mg  on Wed (last dose PTA 6/18). INR today is up but remains subtherapeutic at 1.56. CBC trending down  Goal of Therapy:  INR 2-3 Monitor platelets by anticoagulation protocol: Yes   Plan:  Give warfarin 5 mg po x 1 Monitor daily INR, CBC, clinical course, s/sx of bleed, PO intake, DDI   Thank you for allowing us to participate in this patients care. Signe Coltonya C Zeriah Baysinger, PharmD Pager: [phone]  10/16/2015,12:23 PM

## 2015-10-17 DIAGNOSIS — N390 Urinary tract infection, site not specified: Secondary | ICD-10-CM

## 2015-10-17 DIAGNOSIS — T796XXD Traumatic ischemia of muscle, subsequent encounter: Secondary | ICD-10-CM

## 2015-10-17 LAB — URINE CULTURE: Culture: >=100000 — AB

## 2015-10-17 LAB — CULTURE, BLOOD (ROUTINE X 2)

## 2015-10-17 LAB — BASIC METABOLIC PANEL
Anion gap: 6 (ref 5–15)
BUN: 22 mg/dL — AB (ref 6–20)
CO2: 19 mmol/L — ABNORMAL LOW (ref 22–32)
Calcium: 8.2 mg/dL — ABNORMAL LOW (ref 8.9–10.3)
Chloride: 109 mmol/L (ref 101–111)
Creatinine, Ser: 1.46 mg/dL — ABNORMAL HIGH (ref 0.61–1.24)
GFR calc Af Amer: 55 mL/min — ABNORMAL LOW (ref >60)
GFR, EST NON AFRICAN AMERICAN: 48 mL/min — AB (ref >60)
GLUCOSE: 152 mg/dL — AB (ref 65–99)
POTASSIUM: 4.2 mmol/L (ref 3.5–5.1)
Sodium: 134 mmol/L — ABNORMAL LOW (ref 135–145)

## 2015-10-17 LAB — GLUCOSE, CAPILLARY
GLUCOSE-CAPILLARY: 104 mg/dL — AB (ref 65–99)
Glucose-Capillary: 141 mg/dL — ABNORMAL HIGH (ref 65–99)
Glucose-Capillary: 139 mg/dL — ABNORMAL HIGH (ref 65–99)
Glucose-Capillary: 163 mg/dL — ABNORMAL HIGH (ref 65–99)

## 2015-10-17 LAB — PROTIME-INR
INR: 2.09 — ABNORMAL HIGH (ref 0.00–1.49)
PROTHROMBIN TIME: 23.3 s — AB (ref 11.6–15.2)

## 2015-10-17 MED ORDER — SODIUM CHLORIDE 0.9 % IV SOLN
1.0000 g | Freq: Four times a day (QID) | INTRAVENOUS | Status: DC
  Administered 2015-10-17 – 2015-10-19 (×8): 1 g via INTRAVENOUS
  Filled 2015-10-17 (×10): qty 1000

## 2015-10-17 MED ORDER — METOPROLOL TARTRATE 25 MG PO TABS
125.0000 mg | ORAL_TABLET | Freq: Once | ORAL | Status: AC
  Administered 2015-10-17: 125 mg via ORAL
  Filled 2015-10-17: qty 1

## 2015-10-17 MED ORDER — WARFARIN SODIUM 2.5 MG PO TABS: 2.5000 mg | ORAL_TABLET | ORAL | Status: DC

## 2015-10-17 MED ORDER — METOPROLOL TARTRATE 100 MG PO TABS
200.0000 mg | ORAL_TABLET | Freq: Two times a day (BID) | ORAL | Status: DC
  Administered 2015-10-17 – 2015-10-19 (×4): 200 mg via ORAL
  Filled 2015-10-17 (×4): qty 2

## 2015-10-17 MED ORDER — CIPROFLOXACIN IN D5W 400 MG/200ML IV SOLN
400.0000 mg | Freq: Two times a day (BID) | INTRAVENOUS | Status: DC
Start: 2015-10-17 — End: 2015-10-17
  Filled 2015-10-17: qty 200

## 2015-10-17 MED ORDER — METOPROLOL TARTRATE 100 MG PO TABS: 100.0000 mg | ORAL_TABLET | Freq: Two times a day (BID) | ORAL | Status: DC

## 2015-10-17 MED ORDER — WARFARIN SODIUM 5 MG PO TABS
5.0000 mg | ORAL_TABLET | ORAL | Status: DC
  Administered 2015-10-17 – 2015-10-18 (×2): 5 mg via ORAL
  Filled 2015-10-17 (×2): qty 1

## 2015-10-17 NOTE — Progress Notes (Signed)
Updated report received via Paulette RN in patient's room using SBAR format, updated on today's events, new orders, VS, meds and patient's events of the day, assumed care of patient.

## 2015-10-17 NOTE — Progress Notes (Addendum)
ANTICOAGULATION CONSULT NOTE - Follow Up Consult  Pharmacy Consult for Coumadin + Ampicillin Indication: Afib + Ecoli bacteremia  Allergies  Allergen Reactions  . Duloxetine Other (See Comments)    More nervous  . Sulfur Nausea And Vomiting    Patient Measurements: Height: 5\' 8"  (172.7 cm) Weight: 221 lb 6.4 oz (100.426 kg) IBW/kg (Calculated) : 68.4 Heparin Dosing Weight:   Vital Signs: Temp: 98.1 F (36.7 C) (06/22 0500) Temp Source: Oral (06/22 0500) BP: 134/108 mmHg (06/22 0500) Pulse Rate: 104 (06/22 0500)  Labs:  Recent Labs  10/14/15 1634 10/14/15 2336 10/15/15 0527 10/15/15 0958 10/16/15 0430 10/17/15 0351  HGB 10.7*  --  8.9*  --  8.1*  --   HCT 37.3*  --  31.2*  --  28.5*  --   PLT 169  --  131*  --  114*  --   LABPROT 16.2*  --  16.0*  --  18.7* 23.3*  INR 1.29  --  1.27  --  1.56* 2.09*  CREATININE 2.62*  --  2.40*  --  1.58*  --   CKTOTAL 6146*  --  3193*  --   --   --   CKMB  --   --  7.2*  --   --   --   TROPONINI  --  1.53* 1.13* 0.79*  --   --     Estimated Creatinine Clearance: 51.4 mL/min (by C-G formula based on Cr of 1.58).   Assessment: 70 y.o. M presents s/p fall 6/19. Pt had AKI, rhabdomyolysis and  sepsis due to UTI.   Anticoagulation: Coumadin PTA for afib (chadsvasc2 =2). INR subtherapeutic (1.29) on admission. Noted that pt[facility].5[facility] INR 1.56>2.09 today. Note Hgb trending down to 8.1. Plts down to 114.  --Home dose: 5mg  daily except for 2.5mg  on Wed (last dose 6/18).  Infectious Disease: antibiotics for bacteremia. WBC = 10.6, afebrile, LA wnl. Antibiotics narrowed to Ampicillin  6/19 vanco x 1 6/19 Zosyn x 1 6/20:  ceftriaxone >>6/20 6/20 imipenem >>6/22 6/22 Ampicillin>>  6/19 blood>>Ecoli by BCID 6/19: BC x 2: Ecoli pan Sens 6/19: Ucx: Ecoli pan sensitive  Goal of Therapy:  INR 2-3  Eradication of infection Monitor platelets by anticoagulation protocol: Yes   Plan:   Coumadin 5mg  po daily except 2.5mg /Wed. Daily INR Change Primaxin to Ampicillin 1g IV q6hr. Patient can be discharged on po Amoxil.     Rubee Vega S. Merilynn Finlandobertson, PharmD, BCPS Clinical Staff Pharmacist Pager [phone]  Misty Stanleyobertson, Shirlean Berman Stillinger 10/17/2015,8:30 AM

## 2015-10-17 NOTE — Progress Notes (Signed)
PROGRESS NOTE    Ralph Sullivan  NWG:[id number] DOB: 06-Sep-1945 DOA: 10/14/2015 PCP: Tommy Rainwater, MD   Brief Narrative:  70 y.o. Male with PMH of HTN, PAF on coumadin, Gout (on chronic prednisone), CVA, CKD,  whose wife passed away 3 weeks ago, apparently fell in the kitchen and was unable to get up. Larey Seat about 9pm Sunday. Admitted with AKI, rhabdomyolysis and  sepsis due to UTI, found to have E coli bacteremia.    Assessment & Plan:   Active Problems:   Persistent atrial fibrillation (HCC)   HTN (hypertension)   Rhabdomyolysis   Acute renal failure (ARF) (HCC)   Anemia   UTI (lower urinary tract infection)   Atrial fibrillation with rapid ventricular response (HCC)   1. Sepsis due to UTI, bacteremia:  Growing E COLI. Patient is started on ceftriaxone on admission changed to primaxin and later to ampicillin as they ae pansensitive.   Repeat blood cultures are pending.  Lactic acid normalized.  2. AKI likely ATN in the setting of sepsis. Also dehydration.  Renal US: no hydronephrosis.  Renal parameters are improving. Continue to monitor renal function.   3. Positive troponins likely in the setting of sepsis+AKI. No acute cardiopulmonary symptoms. denies chest pains. Trop is trending down (1.5->1.1). Cont ASA, BB, statins.  -elevated ast likely due to muscle damage  . Repeat CK in am.   4. A fib RVR on admission. Weaned IV Cardizem. Cont oral BB. Resume coumadin per pharmacy . Sub therapeutic INR.  Rate controlled.  5. HTN. Better controlled.  6. Gout. No acute symptoms. ?Chronically  on steroids  7. Rhabdomyolysis: Possibly from sepsis.  Repeat CK in am.   8. Diabetes Mellitus: CBG (last 3)   Recent Labs  10/17/15 0739 10/17/15 1141 10/17/15 1652  GLUCAP 104* 141* 139*   ? New diagnosis.  Diabetes education co ordinator consult.  hgba1c is 7.     DVT prophylaxis: coumadin  Code Status: full Family Communication: discussed with  family at the  bedside Disposition Plan: home onsaturday, when repeat blood cultures are negative.     Consultants:   none  Procedures: Echo shows LVEF of 65%, wall motion was normal, no regional wall abnormalities.    Antimicrobials:  Ceftriaxone 6/19 Imipenem 6/20>>  6/22 Ampicillin 6/22   Subjective: Reports feeling better, does not want to go to SNF, wants to go home.   Objective: Filed Vitals:   10/17/15 0800 10/17/15 0913 10/17/15 1200 10/17/15 1653  BP: 150/126  132/102   Pulse: 99  89   Temp:  98.3 F (36.8 C)  99.1 F (37.3 C)  TempSrc:  Axillary  Oral  Resp: 22  15   Height:      Weight:      SpO2: 99%  98%     Intake/Output Summary (Last 24 hours) at 10/17/15 1803 Last data filed at 10/17/15 1653  Gross per 24 hour  Intake   1820 ml  Output   2425 ml  Net   -605 ml   Filed Weights   10/15/15 0400 10/16/15 0359 10/17/15 0500  Weight: 93.486 kg (206 lb 1.6 oz) 96.843 kg (213 lb 8 oz) 100.426 kg (221 lb 6.4 oz)    Examination:  General exam: Appears calm and comfortable  Respiratory system: Clear to auscultation. Respiratory effort normal. Cardiovascular system: S1 & S2 heard, RRR. No JVD, murmurs, rubs, gallops or clicks. No pedal edema. Gastrointestinal system: Abdomen is nondistended, soft and nontender. No organomegaly or  masses felt. Normal bowel sounds heard. Central nervous system: Alert and oriented. No focal neurological deficits. Extremities: Symmetric 5 x 5 power. Skin: No rashes, lesions or ulcers Psychiatry: Judgement and insight appear normal. Mood & affect appropriate.     Data Reviewed: I have personally reviewed following labs and imaging studies  CBC:  Recent Labs Lab 10/14/15 1634 10/15/15 0527 10/16/15 0430  WBC 12.7* 12.2* 10.6*  NEUTROABS 10.8*  --   --   HGB 10.7* 8.9* 8.1*  HCT 37.3* 31.2* 28.5*  MCV 74.5* 74.8* 74.8*  PLT 169 131* 114*   Basic Metabolic Panel:  Recent Labs Lab 10/14/15 1634 10/15/15 0527  10/16/15 0430 10/17/15 1235  NA 142 141 139 134*  K 4.7 4.3 4.1 4.2  CL 111 113* 114* 109  CO2 20* 17* 20* 19*  GLUCOSE 136* 144* 182* 152*  BUN 45* 40* 28* 22*  CREATININE 2.62* 2.40* 1.58* 1.46*  CALCIUM 9.2 8.1* 8.0* 8.2*   GFR: Estimated Creatinine Clearance: 55.6 mL/min (by C-G formula based on Cr of 1.46). Liver Function Tests:  Recent Labs Lab 10/14/15 1634 10/15/15 0527  AST 278* 220*  ALT 101* 97*  ALKPHOS 122 97  BILITOT 1.1 1.1  PROT 5.9* 4.9*  ALBUMIN 2.9* 2.2*   No results for input(s): LIPASE, AMYLASE in the last 168 hours. No results for input(s): AMMONIA in the last 168 hours. Coagulation Profile:  Recent Labs Lab 10/14/15 1634 10/15/15 0527 10/16/15 0430 10/17/15 0351  INR 1.29 1.27 1.56* 2.09*   Cardiac Enzymes:  Recent Labs Lab 10/14/15 1634 10/14/15 2336 10/15/15 0527 10/15/15 0958  CKTOTAL 6146*  --  3193*  --   CKMB  --   --  7.2*  --   TROPONINI  --  1.53* 1.13* 0.79*   BNP (last 3 results) No results for input(s): PROBNP in the last 8760 hours. HbA1C:  Recent Labs  10/15/15 0527  HGBA1C 7.0*   CBG:  Recent Labs Lab 10/16/15 2107 10/17/15 0739 10/17/15 1141 10/17/15 1652  GLUCAP 169* 104* 141* 139*   Lipid Profile: No results for input(s): CHOL, HDL, LDLCALC, TRIG, CHOLHDL, LDLDIRECT in the last 72 hours. Thyroid Function Tests:  Recent Labs  10/15/15 0527  TSH 0.645   Anemia Panel: No results for input(s): VITAMINB12, FOLATE, FERRITIN, TIBC, IRON, RETICCTPCT in the last 72 hours. Sepsis Labs:  Recent Labs Lab 10/14/15 1657 10/14/15 1951 10/15/15 1000 10/15/15 1125  LATICACIDVEN 2.09* 2.52* 1.2 1.0    Recent Results (from the past 240 hour(s))  Blood Culture (routine x 2)     Status: Abnormal   Collection Time: 10/14/15  5:57 PM  Result Value Ref Range Status   Specimen Description BLOOD LEFT ANTECUBITAL  Final   Special Requests BOTTLES DRAWN AEROBIC AND ANAEROBIC 5CC  Final   Culture  Setup Time    Final    GRAM NEGATIVE RODS IN BOTH AEROBIC AND ANAEROBIC BOTTLES Organism ID to follow CRITICAL RESULT CALLED TO, READ BACK BY AND VERIFIED WITH: J. Sharene ButtersFRENS, PHARM D AT [id number] ON [id number] BY Lucienne CapersS. YARBROUGH    Culture ESCHERICHIA COLI (A)  Final   Report Status 10/17/2015 FINAL  Final   Organism ID, Bacteria ESCHERICHIA COLI  Final      Susceptibility   Escherichia coli - MIC*    AMPICILLIN <=2 SENSITIVE Sensitive     CEFAZOLIN <=4 SENSITIVE Sensitive     CEFEPIME <=1 SENSITIVE Sensitive     CEFTAZIDIME <=1 SENSITIVE Sensitive     CEFTRIAXONE <=  1 SENSITIVE Sensitive     CIPROFLOXACIN <=0.25 SENSITIVE Sensitive     GENTAMICIN <=1 SENSITIVE Sensitive     IMIPENEM <=0.25 SENSITIVE Sensitive     TRIMETH/SULFA <=20 SENSITIVE Sensitive     AMPICILLIN/SULBACTAM <=2 SENSITIVE Sensitive     PIP/TAZO <=4 SENSITIVE Sensitive     * ESCHERICHIA COLI  Blood Culture ID Panel (Reflexed)     Status: Abnormal   Collection Time: 10/14/15  5:57 PM  Result Value Ref Range Status   Enterococcus species NOT DETECTED NOT DETECTED Final   Vancomycin resistance NOT DETECTED NOT DETECTED Final   Listeria monocytogenes NOT DETECTED NOT DETECTED Final   Staphylococcus species NOT DETECTED NOT DETECTED Final   Staphylococcus aureus NOT DETECTED NOT DETECTED Final   Methicillin resistance NOT DETECTED NOT DETECTED Final   Streptococcus species NOT DETECTED NOT DETECTED Final   Streptococcus agalactiae NOT DETECTED NOT DETECTED Final   Streptococcus pneumoniae NOT DETECTED NOT DETECTED Final   Streptococcus pyogenes NOT DETECTED NOT DETECTED Final   Acinetobacter baumannii NOT DETECTED NOT DETECTED Final   Enterobacteriaceae species DETECTED (A) NOT DETECTED Final    Comment: CRITICAL RESULT CALLED TO, READ BACK BY AND VERIFIED WITH: J. FRENS, PHARM D AT 0844 ON [id number] BY S. YARBROUGH    Enterobacter cloacae complex NOT DETECTED NOT DETECTED Final   Escherichia coli DETECTED (A) NOT DETECTED Final    Comment:  CRITICAL RESULT CALLED TO, READ BACK BY AND VERIFIED WITH: J. Sharene Butters D AT [phone] ON [id number] BY S. YARBROUGH    Klebsiella oxytoca NOT DETECTED NOT DETECTED Final   Klebsiella pneumoniae NOT DETECTED NOT DETECTED Final   Proteus species NOT DETECTED NOT DETECTED Final   Serratia marcescens NOT DETECTED NOT DETECTED Final   Carbapenem resistance NOT DETECTED NOT DETECTED Final   Haemophilus influenzae NOT DETECTED NOT DETECTED Final   Neisseria meningitidis NOT DETECTED NOT DETECTED Final   Pseudomonas aeruginosa NOT DETECTED NOT DETECTED Final   Candida albicans NOT DETECTED NOT DETECTED Final   Candida glabrata NOT DETECTED NOT DETECTED Final   Candida krusei NOT DETECTED NOT DETECTED Final   Candida parapsilosis NOT DETECTED NOT DETECTED Final   Candida tropicalis NOT DETECTED NOT DETECTED Final  Blood Culture (routine x 2)     Status: Abnormal   Collection Time: 10/14/15  6:00 PM  Result Value Ref Range Status   Specimen Description BLOOD WRIST RIGHT  Final   Special Requests IN PEDIATRIC BOTTLE 2CC  Final   Culture  Setup Time GRAM NEGATIVE RODS AEROBIC BOTTLE ONLY   Final   Culture (A)  Final    ESCHERICHIA COLI SUSCEPTIBILITIES PERFORMED ON PREVIOUS CULTURE WITHIN THE LAST 5 DAYS.    Report Status 10/17/2015 FINAL  Final  Urine culture     Status: Abnormal   Collection Time: 10/14/15  8:20 PM  Result Value Ref Range Status   Specimen Description URINE, CLEAN CATCH  Final   Special Requests NONE  Final   Culture >=100,000 COLONIES/mL ESCHERICHIA COLI (A)  Final   Report Status 10/17/2015 FINAL  Final   Organism ID, Bacteria ESCHERICHIA COLI (A)  Final      Susceptibility   Escherichia coli - MIC*    AMPICILLIN <=2 SENSITIVE Sensitive     CEFAZOLIN <=4 SENSITIVE Sensitive     CEFTRIAXONE <=1 SENSITIVE Sensitive     CIPROFLOXACIN <=0.25 SENSITIVE Sensitive     GENTAMICIN <=1 SENSITIVE Sensitive     IMIPENEM <=0.25 SENSITIVE Sensitive  NITROFURANTOIN <=16 SENSITIVE  Sensitive     TRIMETH/SULFA <=20 SENSITIVE Sensitive     AMPICILLIN/SULBACTAM <=2 SENSITIVE Sensitive     PIP/TAZO <=4 SENSITIVE Sensitive     * >=100,000 COLONIES/mL ESCHERICHIA COLI  MRSA PCR Screening     Status: None   Collection Time: 10/14/15 11:45 PM  Result Value Ref Range Status   MRSA by PCR NEGATIVE NEGATIVE Final    Comment:        The GeneXpert MRSA Assay (FDA approved for NASAL specimens only), is one component of a comprehensive MRSA colonization surveillance program. It is not intended to diagnose MRSA infection nor to guide or monitor treatment for MRSA infections.   Culture, blood (Routine X 2) w Reflex to ID Panel     Status: None (Preliminary result)   Collection Time: 10/17/15 12:36 PM  Result Value Ref Range Status   Specimen Description BLOOD LEFT ANTECUBITAL  Final   Special Requests BOTTLES DRAWN AEROBIC ONLY [facility]7CC  Final   Culture PENDING  Incomplete   Report Status PENDING  Incomplete         Radiology Studies: No results found.      Scheduled Meds: . ampicillin (OMNIPEN) IV  1 g Intravenous Q6H  . cloNIDine  0.1 mg Oral BID  . insulin aspart  0-9 Units Subcutaneous TID WC  . metoprolol tartrate  200 mg Oral BID  . predniSONE  10 mg Oral Q breakfast  . [START ON 10/23/2015] warfarin  2.5 mg Oral Q Wed-1800  . warfarin  5 mg Oral Once per day on Sun Mon Tue Thu Fri Sat  . Warfarin - Pharmacist Dosing Inpatient   Does not apply q1800   Continuous Infusions:     LOS: 3 days    Time spent: >25 minutes     Adrijana Haros, MD [facility] Pager [phone]  If 7PM-7AM, please contact night-coverage [email or web address] Password [email or web address] 10/17/2015, 6:03 PM

## 2015-10-17 NOTE — Progress Notes (Signed)
Occupational Therapy Evaluation Patient Details Name: Ralph Sullivan MRN: [id number] DOB: [birth year] Today's Date: 10/17/2015    History of Present Illness 70 y.o. Male with PMH of HTN, PAF on coumadin, Gout (on chronic prednisone), CVA, CKD, whose wife passed away 3 weeks ago, apparently fell in the kitchen and was unable to get up. Larey Seat about 9pm Sunday. Admitted with AKI, rhabdomyolysis and sepsis due to UTI, found to have GNR bacteremia   Clinical Impression   PTA, pt was independent with ADLs and mobility. Pt currently presents with generalized weakness, RUE weakness (possible axillary nerve injury from earlier this month?), decreased safety awareness and increased HR impacting ADL and mobility performance. Pt refusing SNF,but agreeable to [facility] services. Recommend HHOT, HH RN, HH aide to ensure pt is able to complete ADLs at home safely. Will continue to follow acutely to maximize independence and safety with ADLs and mobility to allow for safe discharge home.    Follow Up Recommendations  Home health OT;Supervision/Assistance - 24 hour    Equipment Recommendations  3 in 1 bedside comode;Other (comment) (Adaptive equipment - if needed)    Recommendations for Other Services       Precautions / Restrictions Precautions Precautions: Fall Precaution Comments: Watch HR Restrictions Weight Bearing Restrictions: No      Mobility Bed Mobility               General bed mobility comments: Pt up in chair on OT arrival  Transfers Overall transfer level: Needs assistance Equipment used: Rolling walker (2 wheeled) Transfers: Sit to/from Stand Sit to Stand: Mod assist;+2 physical assistance         General transfer comment: +2 assist for boost to stand and to stabilize RW upon standing. VCs for hand placement and proper body positioning with RW. Pt with increased HR once mobilizing (between 140's-180's), but pt was asymptomatic throughout.    Balance Overall balance  assessment: Needs assistance Sitting-balance support: No upper extremity supported;Feet supported Sitting balance-Leahy Scale: Good     Standing balance support: Bilateral upper extremity supported;During functional activity Standing balance-Leahy Scale: Poor Standing balance comment: Reliant on UE support to maintain balance                            ADL Overall ADL's : Needs assistance/impaired     Grooming: Wash/dry hands;Wash/dry face;Oral care;Min guard;Cueing for safety;Standing   Upper Body Bathing: Set up;Sitting;Cueing for compensatory techniques Upper Body Bathing Details (indicate cue type and Ralph): cues for frequent rest breaks Lower Body Bathing: Set up;Sit to/from stand   Upper Body Dressing : Set up;Sitting   Lower Body Dressing: Set up;Sit to/from stand   Toilet Transfer: Moderate assistance;+2 for physical assistance;Cueing for safety;Ambulation;RW Toilet Transfer Details (indicate cue type and Ralph): simulated to chair Toileting- Clothing Manipulation and Hygiene: Moderate assistance;+2 for physical assistance;Sit to/from stand Toileting - Clothing Manipulation Details (indicate cue type and Ralph): assist for sit-stand and balance       General ADL Comments: While standing at sink pt's HR fluctuated from 140-180's rapidly  - pt asymptomatic, RN notified and assessed pt.     Vision Vision Assessment?: No apparent visual deficits   Perception     Praxis      Pertinent Vitals/Pain Pain Assessment: No/denies pain     Hand Dominance Right   Extremity/Trunk Assessment Upper Extremity Assessment Upper Extremity Assessment: RUE deficits/detail RUE Deficits / Details: STRENGTH: deltoids, biceps, triceps 2/5; ROM: shoulder flexion/abduction  30 degrees, elbow flexion 30 degrees, pt uses L hand to help raise RUE up for use; SENSATION: intact - Possible axillary nerve injury? Pt reports the weakness and pain began after sleeping on it badly  when pt's wife was in hospital earlier this month. RUE: Unable to fully assess due to pain RUE Coordination: decreased gross motor;decreased fine motor   Lower Extremity Assessment Lower Extremity Assessment: Defer to PT evaluation   Cervical / Trunk Assessment Cervical / Trunk Assessment: Kyphotic   Communication Communication Communication: No difficulties   Cognition Arousal/Alertness: Awake/alert Behavior During Therapy: WFL for tasks assessed/performed Overall Cognitive Status: Impaired/Different from baseline Area of Impairment: Memory;Safety/judgement;Awareness     Memory: Decreased short-term memory   Safety/Judgement: Decreased awareness of safety;Decreased awareness of deficits Awareness: Emergent   General Comments: Pt moves quickly and has decreased safety awareness making him a high fall risk.    General Comments       Exercises       Shoulder Instructions      Home Living Family/patient expects to be discharged to:: Private residence Living Arrangements: Alone Available Help at Discharge: Family;Friend(s);Available 24 hours/day Type of Home: Mobile home Home Access: Ramped entrance     Home Layout: One level     Bathroom Shower/Tub: Tub/shower unit Shower/tub characteristics: Curtain FirefighterBathroom Toilet: Standard     Home Equipment: Environmental consultantWalker - 2 wheels;Cane - single point;Grab bars - tub/shower;Hand held shower head;Electric scooter   Additional Comments: Pt reports his uncle and church friends will rotate who will be staying with him during the day and night - will be important to confirm this.      Prior Functioning/Environment Level of Independence: Independent        Comments: states ocassional use of a cane when gout flares up    OT Diagnosis: Generalized weakness;Cognitive deficits   OT Problem List: Decreased strength;Decreased activity tolerance;Decreased range of motion;Impaired balance (sitting and/or standing);Decreased safety  awareness;Decreased knowledge of use of DME or AE;Cardiopulmonary status limiting activity;Obesity;Pain;Decreased cognition   OT Treatment/Interventions: Self-care/ADL training;Therapeutic exercise;Energy conservation;DME and/or AE instruction;Therapeutic activities;Patient/family education;Balance training;Cognitive remediation/compensation    OT Goals(Current goals can be found in the care plan section) Acute Rehab OT Goals Patient Stated Goal: to get to being active OT Goal Formulation: With patient Time For Goal Achievement: 10/31/15 Potential to Achieve Goals: Good ADL Goals Pt Will Perform Grooming: with supervision;standing Pt Will Perform Upper Body Bathing: with supervision;sitting Pt Will Perform Lower Body Bathing: with supervision;sit to/from stand Pt Will Transfer to Toilet: with supervision;ambulating;bedside commode Pt Will Perform Toileting - Clothing Manipulation and hygiene: with supervision;sitting/lateral leans;sit to/from stand Additional ADL Goal #1: Pt will independently incorporate 2 energy conservation strategies into ADL tasks with min verbal cues.  OT Frequency: Min 3X/week   Barriers to D/C: Decreased caregiver support  Lives alone, refusing SNF       Co-evaluation PT/OT/SLP Co-Evaluation/Treatment: Yes Ralph for Co-Treatment: For patient/therapist safety   OT goals addressed during session: ADL's and self-care;Proper use of Adaptive equipment and DME      End of Session Equipment Utilized During Treatment: Gait belt;Rolling walker Nurse Communication: Mobility status;Other (comment) (HR during session)  Activity Tolerance: Treatment limited secondary to medical complications (Comment) (high HR) Patient left: in chair;with call bell/phone within reach;with chair alarm set   Time: [id number] OT Time Calculation (min): 27 min Charges:  OT General Charges $OT Visit: 1 Procedure OT Evaluation $OT Eval Moderate Complexity: 1 Procedure G-Codes:     Nils PyleJulia Divya Munshi, OTR/L Pager:  [phone] 10/17/2015, 3:04 PM

## 2015-10-17 NOTE — Progress Notes (Signed)
Physical Therapy Treatment Patient Details Name: Ralph Sullivan MRN: [id number] DOB: 12/02/1945 Today's Date: 10/17/2015    History of Present Illness 70 y.o. Male with PMH of HTN, PAF on coumadin, Gout (on chronic prednisone), CVA, CKD, whose wife passed away 3 weeks ago, apparently fell in the kitchen and was unable to get up. Larey SeatFell about 9pm Sunday. Admitted with AKI, rhabdomyolysis and sepsis due to UTI, found to have GNR bacteremia    PT Comments    Patient progressing with ambulation, but remains tachnycardic though asymptomatic.  He now reports he will have people rotating to assist him at home so recommend HHPT at d/c.  Follow Up Recommendations  Home health PT;Supervision/Assistance - 24 hour     Equipment Recommendations  None recommended by PT    Recommendations for Other Services       Precautions / Restrictions Precautions Precautions: Fall Precaution Comments: Watch HR Restrictions Weight Bearing Restrictions: No    Mobility  Bed Mobility               General bed mobility comments: Pt up in chair   Transfers Overall transfer level: Needs assistance Equipment used: Rolling walker (2 wheeled) Transfers: Sit to/from Stand Sit to Stand: Mod assist;+2 physical assistance         General transfer comment: +2 assist for boost to stand and to stabilize RW upon standing. VCs for hand placement and proper body positioning with RW. Pt with increased HR once mobilizing (between 140's-180's), but pt was asymptomatic throughout.  Ambulation/Gait Ambulation/Gait assistance: Min assist;+2 safety/equipment Ambulation Distance (Feet): 60 Feet Assistive device: Rolling walker (2 wheeled) Gait Pattern/deviations: Step-through pattern;Trunk flexed     General Gait Details: increased speed, needed instruction to sit due to HR up sustained in 180's with ambulation, RN and MD in hall and discussing adjusting meds   Stairs            Wheelchair Mobility     Modified Rankin (Stroke Patients Only)       Balance Overall balance assessment: Needs assistance Sitting-balance support: No upper extremity supported;Feet supported Sitting balance-Leahy Scale: Good     Standing balance support: Bilateral upper extremity supported;During functional activity Standing balance-Leahy Scale: Poor Standing balance comment: brushed teeth and washed face at sink, minguard for safety                    Cognition Arousal/Alertness: Awake/alert Behavior During Therapy: WFL for tasks assessed/performed Overall Cognitive Status: Impaired/Different from baseline Area of Impairment: Memory;Safety/judgement;Awareness     Memory: Decreased short-term memory   Safety/Judgement: Decreased awareness of safety;Decreased awareness of deficits Awareness: Emergent   General Comments: moves fast, high fall risk    Exercises      General Comments General comments (skin integrity, edema, etc.): HR up to 140-180 with activity      Pertinent Vitals/Pain Pain Assessment: No/denies pain Faces Pain Scale: Hurts little more Pain Location: L shoulder  Pain Intervention(s): Monitored during session    Home Living Family/patient expects to be discharged to:: Private residence Living Arrangements: Alone Available Help at Discharge: Family;Friend(s);Available 24 hours/day Type of Home: Mobile home Home Access: Ramped entrance   Home Layout: One level Home Equipment: Walker - 2 wheels;Cane - single point;Grab bars - tub/shower;Hand held shower head;Electric scooter Additional Comments: Pt reports his uncle and church friends will rotate who will be staying with him during the day and night - will be important to confirm this.    Prior Function Level  of Independence: Independent      Comments: states ocassional use of a cane when gout flares up   PT Goals (current goals can now be found in the care plan section) Acute Rehab PT Goals Patient Stated  Goal: to get to being active Progress towards PT goals: Progressing toward goals    Frequency  Min 3X/week    PT Plan Discharge plan needs to be updated    Co-evaluation PT/OT/SLP Co-Evaluation/Treatment: Yes Reason for Co-Treatment: For patient/therapist safety PT goals addressed during session: Mobility/safety with mobility;Proper use of DME OT goals addressed during session: ADL's and self-care;Proper use of Adaptive equipment and DME     End of Session Equipment Utilized During Treatment: Gait belt Activity Tolerance: Treatment limited secondary to medical complications (Comment) Patient left: in chair;with call bell/phone within reach;with chair alarm set     Time: [id number] PT Time Calculation (min) (ACUTE ONLY): 27 min  Charges:  $Gait Training: 8-22 mins                    G Codes:      Elray McgregorCynthia Bakary Bramer 10/17/2015, 5:00 PM  Sheran Lawlessyndi Emilina Smarr, [place]PT [phone] 10/17/2015

## 2015-10-18 ENCOUNTER — Telehealth: Payer: Self-pay | Admitting: Cardiology

## 2015-10-18 LAB — CBC
HCT: 32.6 % — ABNORMAL LOW (ref 39.0–52.0)
HEMOGLOBIN: 9.1 g/dL — AB (ref 13.0–17.0)
MCH: 20.4 pg — AB (ref 26.0–34.0)
MCHC: 27.9 g/dL — AB (ref 30.0–36.0)
MCV: 73.1 fL — ABNORMAL LOW (ref 78.0–100.0)
PLATELETS: 123 10*3/uL — AB (ref 150–400)
RBC: 4.46 MIL/uL (ref 4.22–5.81)
RDW: 18.7 % — AB (ref 11.5–15.5)
WBC: 8.7 10*3/uL (ref 4.0–10.5)

## 2015-10-18 LAB — BASIC METABOLIC PANEL
Anion gap: 5 (ref 5–15)
BUN: 20 mg/dL (ref 6–20)
CALCIUM: 8.5 mg/dL — AB (ref 8.9–10.3)
CO2: 23 mmol/L (ref 22–32)
Chloride: 109 mmol/L (ref 101–111)
Creatinine, Ser: 1.46 mg/dL — ABNORMAL HIGH (ref 0.61–1.24)
GFR calc Af Amer: 55 mL/min — ABNORMAL LOW (ref >60)
GFR, EST NON AFRICAN AMERICAN: 48 mL/min — AB (ref >60)
GLUCOSE: 100 mg/dL — AB (ref 65–99)
POTASSIUM: 4 mmol/L (ref 3.5–5.1)
SODIUM: 137 mmol/L (ref 135–145)

## 2015-10-18 LAB — GLUCOSE, CAPILLARY
GLUCOSE-CAPILLARY: 90 mg/dL (ref 65–99)
Glucose-Capillary: 121 mg/dL — ABNORMAL HIGH (ref 65–99)
Glucose-Capillary: 158 mg/dL — ABNORMAL HIGH (ref 65–99)
Glucose-Capillary: 155 mg/dL — ABNORMAL HIGH (ref 65–99)

## 2015-10-18 LAB — CK: CK TOTAL: 754 U/L — AB (ref 49–397)

## 2015-10-18 LAB — MAGNESIUM: MAGNESIUM: 1.6 mg/dL — AB (ref 1.7–2.4)

## 2015-10-18 LAB — PROTIME-INR
INR: 2.21 — AB (ref 0.00–1.49)
Prothrombin Time: 24.3 seconds — ABNORMAL HIGH (ref 11.6–15.2)

## 2015-10-18 MED ORDER — LIVING WELL WITH DIABETES BOOK
Freq: Once | Status: AC
  Administered 2015-10-18: 12:00:00
  Filled 2015-10-18: qty 1

## 2015-10-18 MED ORDER — CLONIDINE HCL 0.1 MG PO TABS
0.1000 mg | ORAL_TABLET | ORAL | Status: AC
  Administered 2015-10-18: 0.1 mg via ORAL
  Filled 2015-10-18: qty 1

## 2015-10-18 MED ORDER — CLONIDINE HCL 0.2 MG PO TABS
0.2000 mg | ORAL_TABLET | Freq: Two times a day (BID) | ORAL | Status: DC
  Administered 2015-10-18 – 2015-10-19 (×2): 0.2 mg via ORAL
  Filled 2015-10-18 (×2): qty 1

## 2015-10-18 NOTE — Progress Notes (Signed)
PROGRESS NOTE    Ralph Sullivan  ZOX:[id number] DOB: [birth year] DOA: 10/14/2015 PCP: Tommy Rainwater, MD   Brief Narrative:  70 y.o. Male with PMH of HTN, PAF on coumadin, Gout (on chronic prednisone), CVA, CKD,  whose wife passed away 3 weeks ago, apparently fell in the kitchen and was unable to get up. Larey Seat about 9pm Sunday. Admitted with AKI, rhabdomyolysis and  sepsis due to UTI, found to have E coli bacteremia.    Assessment & Plan:   Active Problems:   Persistent atrial fibrillation (HCC)   HTN (hypertension)   Rhabdomyolysis   Acute renal failure (ARF) (HCC)   Anemia   UTI (lower urinary tract infection)   Atrial fibrillation with rapid ventricular response (HCC)   1. Sepsis due to UTI, bacteremia:  Growing E COLI. Patient is started on ceftriaxone on admission changed to primaxin and later to ampicillin as they are pansensitive.   Repeat blood cultures are pending. If they are negative for 48 hours, will plan for discharge in am with oral amoxicillin to complete the course.  Lactic acid normalized.  2. AKI likely ATN in the setting of sepsis. Also dehydration.  Renal US: no hydronephrosis.  Renal parameters are improving. Continue to monitor renal function.   3. Positive troponins likely in the setting of sepsis+AKI. No acute cardiopulmonary symptoms. denies chest pains. Trop is trending down (1.5->1.1). Cont ASA, BB, statins.  -elevated ast likely due to muscle damage  . Repeat CK shows improvement.   4. A fib RVR on admission. Weaned IV Cardizem. Cont oral BB. Resume coumadin per pharmacy .  Therapeutic INR.  Rate controlled.  5. HTN. Better controlled.  6. Gout. No acute symptoms. ?Chronically  on steroids  7. Rhabdomyolysis: Possibly from sepsis.  Repeat CK show improvement.   8. Diabetes Mellitus: CBG (last 3)   Recent Labs  10/17/15 2054 10/18/15 0733 10/18/15 1144  GLUCAP 163* 90 121*   ? New diagnosis.  Diabetes education co ordinator  consult.  hgba1c is 7.     DVT prophylaxis: coumadin  Code Status: full Family Communication: discussed with  family at the bedside Disposition Plan: home onsaturday, when repeat blood cultures are negative.     Consultants:   none  Procedures: Echo shows LVEF of 65%, wall motion was normal, no regional wall abnormalities.    Antimicrobials:  Ceftriaxone 6/19 Imipenem 6/20>>  6/22 Ampicillin 6/22   Subjective: Reports feeling better, does not want to go to SNF, wants to go home.  No new complaints.   Objective: Filed Vitals:   10/18/15 0400 10/18/15 0752 10/18/15 0825 10/18/15 1227  BP: 153/109 148/104 155/116 125/80  Pulse: 89 95  100  Temp: 97.7 F (36.5 C) 97.7 F (36.5 C)  97.8 F (36.6 C)  TempSrc: Oral Oral  Oral  Resp: 18 16 15 14   Height:      Weight: 98.113 kg (216 lb 4.8 oz)     SpO2: 100% 100%  100%    Intake/Output Summary (Last 24 hours) at 10/18/15 1634 Last data filed at 10/18/15 1227  Gross per 24 hour  Intake    640 ml  Output   2776 ml  Net  -2136 ml   Filed Weights   10/16/15 0359 10/17/15 0500 10/18/15 0400  Weight: 96.843 kg (213 lb 8 oz) 100.426 kg (221 lb 6.4 oz) 98.113 kg (216 lb 4.8 oz)    Examination:  General exam: Appears calm and comfortable  Respiratory system: Clear to  auscultation. Respiratory effort normal. Cardiovascular system: S1 & S2 heard, RRR. No JVD, murmurs, rubs, gallops or clicks. No pedal edema. Gastrointestinal system: Abdomen is nondistended, soft and nontender. No organomegaly or masses felt. Normal bowel sounds heard. Central nervous system: Alert and oriented. No focal neurological deficits. Extremities: Symmetric 5 x 5 power. Skin: No rashes, lesions or ulcers Psychiatry: Judgement and insight appear normal. Mood & affect appropriate.     Data Reviewed: I have personally reviewed following labs and imaging studies  CBC:  Recent Labs Lab 10/14/15 1634 10/15/15 0527 10/16/15 0430  10/18/15 0415  WBC 12.7* 12.2* 10.6* 8.7  NEUTROABS 10.8*  --   --   --   HGB 10.7* 8.9* 8.1* 9.1*  HCT 37.3* 31.2* 28.5* 32.6*  MCV 74.5* 74.8* 74.8* 73.1*  PLT 169 131* 114* 123*   Basic Metabolic Panel:  Recent Labs Lab 10/14/15 1634 10/15/15 0527 10/16/15 0430 10/17/15 1235 10/18/15 0415  NA 142 141 139 134* 137  K 4.7 4.3 4.1 4.2 4.0  CL 111 113* 114* 109 109  CO2 20* 17* 20* 19* 23  GLUCOSE 136* 144* 182* 152* 100*  BUN 45* 40* 28* 22* 20  CREATININE 2.62* 2.40* 1.58* 1.46* 1.46*  CALCIUM 9.2 8.1* 8.0* 8.2* 8.5*  MG  --   --   --   --  1.6*   GFR: Estimated Creatinine Clearance: 55 mL/min (by C-G formula based on Cr of 1.46). Liver Function Tests:  Recent Labs Lab 10/14/15 1634 10/15/15 0527  AST 278* 220*  ALT 101* 97*  ALKPHOS 122 97  BILITOT 1.1 1.1  PROT 5.9* 4.9*  ALBUMIN 2.9* 2.2*   No results for input(s): LIPASE, AMYLASE in the last 168 hours. No results for input(s): AMMONIA in the last 168 hours. Coagulation Profile:  Recent Labs Lab 10/14/15 1634 10/15/15 0527 10/16/15 0430 10/17/15 0351 10/18/15 0415  INR 1.29 1.27 1.56* 2.09* 2.21*   Cardiac Enzymes:  Recent Labs Lab 10/14/15 1634 10/14/15 2336 10/15/15 0527 10/15/15 0958 10/18/15 0415  CKTOTAL 6146*  --  3193*  --  754*  CKMB  --   --  7.2*  --   --   TROPONINI  --  1.53* 1.13* 0.79*  --    BNP (last 3 results) No results for input(s): PROBNP in the last 8760 hours. HbA1C: No results for input(s): HGBA1C in the last 72 hours. CBG:  Recent Labs Lab 10/17/15 1141 10/17/15 1652 10/17/15 2054 10/18/15 0733 10/18/15 1144  GLUCAP 141* 139* 163* 90 121*   Lipid Profile: No results for input(s): CHOL, HDL, LDLCALC, TRIG, CHOLHDL, LDLDIRECT in the last 72 hours. Thyroid Function Tests: No results for input(s): TSH, T4TOTAL, FREET4, T3FREE, THYROIDAB in the last 72 hours. Anemia Panel: No results for input(s): VITAMINB12, FOLATE, FERRITIN, TIBC, IRON, RETICCTPCT in the  last 72 hours. Sepsis Labs:  Recent Labs Lab 10/14/15 1657 10/14/15 1951 10/15/15 1000 10/15/15 1125  LATICACIDVEN 2.09* 2.52* 1.2 1.0    Recent Results (from the past 240 hour(s))  Blood Culture (routine x 2)     Status: Abnormal   Collection Time: 10/14/15  5:57 PM  Result Value Ref Range Status   Specimen Description BLOOD LEFT ANTECUBITAL  Final   Special Requests BOTTLES DRAWN AEROBIC AND ANAEROBIC 5CC  Final   Culture  Setup Time   Final    GRAM NEGATIVE RODS IN BOTH AEROBIC AND ANAEROBIC BOTTLES Organism ID to follow CRITICAL RESULT CALLED TO, READ BACK BY AND VERIFIED WITH: J.  Sharene ButtersFRENS, PHARM D AT [phone] ON [id number] BY Lucienne CapersS. YARBROUGH    Culture ESCHERICHIA COLI (A)  Final   Report Status 10/17/2015 FINAL  Final   Organism ID, Bacteria ESCHERICHIA COLI  Final      Susceptibility   Escherichia coli - MIC*    AMPICILLIN <=2 SENSITIVE Sensitive     CEFAZOLIN <=4 SENSITIVE Sensitive     CEFEPIME <=1 SENSITIVE Sensitive     CEFTAZIDIME <=1 SENSITIVE Sensitive     CEFTRIAXONE <=1 SENSITIVE Sensitive     CIPROFLOXACIN <=0.25 SENSITIVE Sensitive     GENTAMICIN <=1 SENSITIVE Sensitive     IMIPENEM <=0.25 SENSITIVE Sensitive     TRIMETH/SULFA <=20 SENSITIVE Sensitive     AMPICILLIN/SULBACTAM <=2 SENSITIVE Sensitive     PIP/TAZO <=4 SENSITIVE Sensitive     * ESCHERICHIA COLI  Blood Culture ID Panel (Reflexed)     Status: Abnormal   Collection Time: 10/14/15  5:57 PM  Result Value Ref Range Status   Enterococcus species NOT DETECTED NOT DETECTED Final   Vancomycin resistance NOT DETECTED NOT DETECTED Final   Listeria monocytogenes NOT DETECTED NOT DETECTED Final   Staphylococcus species NOT DETECTED NOT DETECTED Final   Staphylococcus aureus NOT DETECTED NOT DETECTED Final   Methicillin resistance NOT DETECTED NOT DETECTED Final   Streptococcus species NOT DETECTED NOT DETECTED Final   Streptococcus agalactiae NOT DETECTED NOT DETECTED Final   Streptococcus pneumoniae NOT  DETECTED NOT DETECTED Final   Streptococcus pyogenes NOT DETECTED NOT DETECTED Final   Acinetobacter baumannii NOT DETECTED NOT DETECTED Final   Enterobacteriaceae species DETECTED (A) NOT DETECTED Final    Comment: CRITICAL RESULT CALLED TO, READ BACK BY AND VERIFIED WITH: J. FRENS, PHARM D AT 0844 ON [id number] BY S. YARBROUGH    Enterobacter cloacae complex NOT DETECTED NOT DETECTED Final   Escherichia coli DETECTED (A) NOT DETECTED Final    Comment: CRITICAL RESULT CALLED TO, READ BACK BY AND VERIFIED WITH: J. Sharene ButtersFRENS, PHARM D AT [phone] ON [id number] BY S. YARBROUGH    Klebsiella oxytoca NOT DETECTED NOT DETECTED Final   Klebsiella pneumoniae NOT DETECTED NOT DETECTED Final   Proteus species NOT DETECTED NOT DETECTED Final   Serratia marcescens NOT DETECTED NOT DETECTED Final   Carbapenem resistance NOT DETECTED NOT DETECTED Final   Haemophilus influenzae NOT DETECTED NOT DETECTED Final   Neisseria meningitidis NOT DETECTED NOT DETECTED Final   Pseudomonas aeruginosa NOT DETECTED NOT DETECTED Final   Candida albicans NOT DETECTED NOT DETECTED Final   Candida glabrata NOT DETECTED NOT DETECTED Final   Candida krusei NOT DETECTED NOT DETECTED Final   Candida parapsilosis NOT DETECTED NOT DETECTED Final   Candida tropicalis NOT DETECTED NOT DETECTED Final  Blood Culture (routine x 2)     Status: Abnormal   Collection Time: 10/14/15  6:00 PM  Result Value Ref Range Status   Specimen Description BLOOD WRIST RIGHT  Final   Special Requests IN PEDIATRIC BOTTLE 2CC  Final   Culture  Setup Time GRAM NEGATIVE RODS AEROBIC BOTTLE ONLY   Final   Culture (A)  Final    ESCHERICHIA COLI SUSCEPTIBILITIES PERFORMED ON PREVIOUS CULTURE WITHIN THE LAST 5 DAYS.    Report Status 10/17/2015 FINAL  Final  Urine culture     Status: Abnormal   Collection Time: 10/14/15  8:20 PM  Result Value Ref Range Status   Specimen Description URINE, CLEAN CATCH  Final   Special Requests NONE  Final   Culture >=100,000  COLONIES/mL  ESCHERICHIA COLI (A)  Final   Report Status 10/17/2015 FINAL  Final   Organism ID, Bacteria ESCHERICHIA COLI (A)  Final      Susceptibility   Escherichia coli - MIC*    AMPICILLIN <=2 SENSITIVE Sensitive     CEFAZOLIN <=4 SENSITIVE Sensitive     CEFTRIAXONE <=1 SENSITIVE Sensitive     CIPROFLOXACIN <=0.25 SENSITIVE Sensitive     GENTAMICIN <=1 SENSITIVE Sensitive     IMIPENEM <=0.25 SENSITIVE Sensitive     NITROFURANTOIN <=16 SENSITIVE Sensitive     TRIMETH/SULFA <=20 SENSITIVE Sensitive     AMPICILLIN/SULBACTAM <=2 SENSITIVE Sensitive     PIP/TAZO <=4 SENSITIVE Sensitive     * >=100,000 COLONIES/mL ESCHERICHIA COLI  MRSA PCR Screening     Status: None   Collection Time: 10/14/15 11:45 PM  Result Value Ref Range Status   MRSA by PCR NEGATIVE NEGATIVE Final    Comment:        The GeneXpert MRSA Assay (FDA approved for NASAL specimens only), is one component of a comprehensive MRSA colonization surveillance program. It is not intended to diagnose MRSA infection nor to guide or monitor treatment for MRSA infections.   Culture, blood (Routine X 2) w Reflex to ID Panel     Status: None (Preliminary result)   Collection Time: 10/17/15 12:36 PM  Result Value Ref Range Status   Specimen Description BLOOD LEFT ANTECUBITAL  Final   Special Requests BOTTLES DRAWN AEROBIC ONLY 7CC  Final   Culture NO GROWTH 1 DAY  Final   Report Status PENDING  Incomplete  Culture, blood (Routine X 2) w Reflex to ID Panel     Status: None (Preliminary result)   Collection Time: 10/17/15 12:40 PM  Result Value Ref Range Status   Specimen Description BLOOD LEFT HAND  Final   Special Requests BOTTLES DRAWN AEROBIC AND ANAEROBIC 5CC  Final   Culture NO GROWTH 1 DAY  Final   Report Status PENDING  Incomplete         Radiology Studies: No results found.      Scheduled Meds: . ampicillin (OMNIPEN) IV  1 g Intravenous Q6H  . cloNIDine  0.2 mg Oral BID  . insulin aspart  0-9 Units  Subcutaneous TID WC  . metoprolol tartrate  200 mg Oral BID  . predniSONE  10 mg Oral Q breakfast  . [START ON 10/23/2015] warfarin  2.5 mg Oral Q Wed-1800  . warfarin  5 mg Oral Once per day on Sun Mon Tue Thu Fri Sat  . Warfarin - Pharmacist Dosing Inpatient   Does not apply q1800   Continuous Infusions:     LOS: 4 days    Time spent: 20 minutes     Juandiego Kolenovic, MD [facility] Pager [phone]  If 7PM-7AM, please contact night-coverage [email or web address] Password [email or web address] 10/18/2015, 4:34 PM

## 2015-10-18 NOTE — Telephone Encounter (Signed)
Spoke with him. Encouraged him to take advantage of SNF if that is what PT is recommending. Will continue to encourage this to help with his expectations.   Thanks for the update.   Donato SchultzMark Mathews Stuhr, MD

## 2015-10-18 NOTE — Telephone Encounter (Signed)
Will forward to Dr Skains for his knowledge.  °

## 2015-10-18 NOTE — Progress Notes (Signed)
Inpatient Diabetes Program Recommendations  AACE/ADA: New Consensus Statement on Inpatient Glycemic Control (2015)  Target Ranges:  Prepandial:   less than 140 mg/dL      Peak postprandial:   less than 180 mg/dL (1-2 hours)      Critically ill patients:  140 - 180 mg/dL   Lab Results  Component Value Date   GLUCAP 121* 10/18/2015   HGBA1C 7.0* 10/15/2015    Review of Glycemic Control  Diabetes history: No prior hx DM  Current orders for Inpatient glycemic control: Novolog correction 0-9 units tid  Inpatient Diabetes Program Recommendations:  Spoke with patient to inquire hx. Patient has taken cinnamon pills @ home previously @ intervals when his CBGs have been elevated. Has a glucose meter and currently checks his CBGs every other day fasting. Gave handouts on basic plate method and guide to healthy living for review. Patient states he has recently lost approx. 50 lbs over the past several months. Patient is teary eyed talking about the recent loss of his wife. Will follow.  Thank you, Billy FischerJudy E. Murry Diaz, RN, MSN, CDE Inpatient Glycemic Control Team Team Pager [phone] (8am-5pm) 10/18/2015 11:54 AM

## 2015-10-18 NOTE — Progress Notes (Signed)
Physical Therapy Treatment Patient Details Name: Gillian ScarceSammy K Swann MRN: [id number] DOB: 11/05/1945 Today's Date: 10/18/2015    History of Present Illness 70 y.o. Male with PMH of HTN, PAF on coumadin, Gout (on chronic prednisone), CVA, CKD, whose wife passed away 3 weeks ago, apparently fell in the kitchen and was unable to get up. Larey SeatFell about 9pm Sunday. Admitted with AKI, rhabdomyolysis and sepsis due to UTI, found to have GNR bacteremia    PT Comments    Patient progressing with tolerance to ambulation.  Still with elevated HR, but max today 138 as compared to 182 yesterday.  BP 157/122 after ambulation.  Patient responding to cues for improved posture, decreased speed and improved safety with ambulation this session.  Will continue to benefit from skilled PT in the acute setting prior to d/c home.   Follow Up Recommendations  Home health PT;Supervision/Assistance - 24 hour     Equipment Recommendations  None recommended by PT    Recommendations for Other Services       Precautions / Restrictions Precautions Precautions: Fall Precaution Comments: Watch HR, and BP Restrictions Weight Bearing Restrictions: No    Mobility  Bed Mobility               General bed mobility comments: Pt up in chair   Transfers Overall transfer level: Needs assistance Equipment used: Rolling walker (2 wheeled)   Sit to Stand: +2 physical assistance;Min assist;Mod assist         General transfer comment: pt pushing up with arms from low seat, but needed assist of two to safely rise; from EOB with bed rail to push off of able to rise with mod A of one  Ambulation/Gait Ambulation/Gait assistance: Min guard;+2 safety/equipment Ambulation Distance (Feet): 150 Feet (x 2) Assistive device: Rolling walker (2 wheeled) Gait Pattern/deviations: Step-through pattern;Decreased stride length;Trunk flexed     General Gait Details: cues for posture, speed and proximity to walker; chair following  for safety and if HR too high   Stairs            Wheelchair Mobility    Modified Rankin (Stroke Patients Only)       Balance Overall balance assessment: Needs assistance   Sitting balance-Leahy Scale: Good     Standing balance support: Bilateral upper extremity supported Standing balance-Leahy Scale: Poor Standing balance comment: balances with UE support                    Cognition Arousal/Alertness: Awake/alert Behavior During Therapy: WFL for tasks assessed/performed Overall Cognitive Status: Impaired/Different from baseline Area of Impairment: Safety/judgement         Safety/Judgement: Decreased awareness of deficits;Decreased awareness of safety     General Comments: responding well to cues for slowing down today and improvements in posture    Exercises Other Exercises Other Exercises: seated postural strengthening B scap retraction with mod facilitation for L retraction and depresssion 5x 5 sec hold    General Comments General comments (skin integrity, edema, etc.): HR 90-138 with ambulation today; BP 143/110 prior to ambulation 157/122 after ambulation; but lowered to 144/108 allowing second bout of ambulation this session.      Pertinent Vitals/Pain Faces Pain Scale: Hurts little more Pain Location: L shoulder with movement Pain Intervention(s): Monitored during session;Repositioned    Home Living                      Prior Function  PT Goals (current goals can now be found in the care plan section) Progress towards PT goals: Progressing toward goals    Frequency  Min 3X/week    PT Plan Current plan remains appropriate    Co-evaluation             End of Session Equipment Utilized During Treatment: Gait belt Activity Tolerance: Treatment limited secondary to medical complications (Comment) (elevated BP) Patient left: in chair;with call bell/phone within reach;with chair alarm set     Time:  1005-1025 PT Time Calculation (min) (ACUTE ONLY): 20 min  Charges:  $Gait Training: 8-22 mins                    G Codes:      Elray McgregorCynthia Kaydin Karbowski 10/18/2015, 11:06 AM Sheran Lawlessyndi Lorenza Shakir, PT [phone] 10/18/2015

## 2015-10-18 NOTE — Progress Notes (Signed)
Occupational Therapy Treatment Patient Details Name: Ralph Sullivan MRN: [id number] DOB: 04/19/1946 Today's Date: 10/18/2015    History of present illness 70 y.o. Male with PMH of HTN, PAF on coumadin, Gout (on chronic prednisone), CVA, CKD, whose wife passed away 3 weeks ago, apparently fell in the kitchen and was unable to get up. Larey SeatFell about 9pm Sunday. Admitted with AKI, rhabdomyolysis and sepsis due to UTI, found to have GNR bacteremia   OT comments  Pt progressing well and seemed more alert and responsive to safety cues today. Pt with HR increase during activity (up to 130's), but continues to be asymptomatic. Encouraged frequent rest breaks and fall prevention strategies as well. Will continue to follow acutely.   Follow Up Recommendations  Home health OT;Supervision/Assistance - 24 hour    Equipment Recommendations  3 in 1 bedside comode    Recommendations for Other Services      Precautions / Restrictions Precautions Precautions: Fall Precaution Comments: Watch HR, and BP Restrictions Weight Bearing Restrictions: No       Mobility Bed Mobility               General bed mobility comments: Pt up in chair on OT arrival  Transfers Overall transfer level: Needs assistance Equipment used: Rolling walker (2 wheeled) Transfers: Sit to/from Stand Sit to Stand: Min assist         General transfer comment: Min assist for slight boost to stand and to stabilize RW. VCs for hand placement and safety while ambulating. HR increased to 130's during activity, but pt asymptomatic.    Balance Overall balance assessment: Needs assistance Sitting-balance support: No upper extremity supported;Feet supported Sitting balance-Leahy Scale: Good     Standing balance support: Bilateral upper extremity supported;During functional activity Standing balance-Leahy Scale: Poor                     ADL Overall ADL's : Needs assistance/impaired Eating/Feeding: Modified  independent;Cueing for compensatory techinques;Sitting Eating/Feeding Details (indicate cue type and reason): provided red theratubing to build-up utensils as pt has been having to eat with L hand since R rotator cuff injury     Upper Body Bathing: Supervision/ safety;Sitting   Lower Body Bathing: Supervison/ safety;Sit to/from stand Lower Body Bathing Details (indicate cue type and reason): able to cross ankle-over-knee Upper Body Dressing : Supervision/safety;Sitting   Lower Body Dressing: Supervision/safety;Sit to/from stand Lower Body Dressing Details (indicate cue type and reason): able to cross ankle-over-knee Toilet Transfer: Min guard;Cueing for safety;Ambulation;BSC;RW Toilet Transfer Details (indicate cue type and reason): BSC over toilet, cues for hand placement Toileting- Clothing Manipulation and Hygiene: Min guard;Sit to/from Nurse, children'sstand     Tub/Shower Transfer Details (indicate cue type and reason): advised pt to have supervision getting in/out of tub; will sponge bathe for awhile Functional mobility during ADLs: Min guard;Rolling walker General ADL Comments: Pt's HR increased to 132 while completing ADLs today, but better controlled than yesterday. Provided pt with red theratube for eating.      Vision                     Perception     Praxis      Cognition   Behavior During Therapy: [facility] Center[facility]FL for tasks assessed/performed Overall Cognitive Status: Impaired/Different from baseline Area of Impairment: Safety/judgement          Safety/Judgement: Decreased awareness of safety;Decreased awareness of deficits     General Comments: Pt appeared better today, but still has limited understanding  of safety and limitations with HR.     Extremity/Trunk Assessment               Exercises     Shoulder Instructions       General Comments      Pertinent Vitals/ Pain       Pain Assessment: 0-10 Pain Score: 3  Pain Location: L shoulder with movement Pain  Descriptors / Indicators: Aching Pain Intervention(s): Limited activity within patient's tolerance;Monitored during session;Repositioned  Home Living                                          Prior Functioning/Environment              Frequency Min 3X/week     Progress Toward Goals  OT Goals(current goals can now be found in the care plan section)  Progress towards OT goals: Progressing toward goals  Acute Rehab OT Goals Patient Stated Goal: to get to being active OT Goal Formulation: With patient Time For Goal Achievement: 10/31/15 Potential to Achieve Goals: Good ADL Goals Pt Will Perform Grooming: with supervision;standing Pt Will Perform Upper Body Bathing: with supervision;sitting Pt Will Perform Lower Body Bathing: with supervision;sit to/from stand Pt Will Transfer to Toilet: with supervision;ambulating;bedside commode Pt Will Perform Toileting - Clothing Manipulation and hygiene: with supervision;sitting/lateral leans;sit to/from stand Additional ADL Goal #1: Pt will independently incorporate 2 energy conservation strategies into ADL tasks with min verbal cues.  Plan Discharge plan remains appropriate    Co-evaluation                 End of Session Equipment Utilized During Treatment: Gait belt;Rolling walker   Activity Tolerance Patient tolerated treatment well   Patient Left in chair;with call bell/phone within reach   Nurse Communication Mobility status        Time: [id number] OT Time Calculation (min): 24 min  Charges: OT General Charges $OT Visit: 1 Procedure OT Treatments $Self Care/Home Management : 23-37 mins  Ralph Sullivan, OTR/L Pager: [phone] 10/18/2015, 4:27 PM

## 2015-10-18 NOTE — Progress Notes (Addendum)
ANTICOAGULATION CONSULT NOTE - Follow Up Consult  Pharmacy Consult for Coumadin Indication: atrial fibrillation  Allergies  Allergen Reactions  . Duloxetine Other (See Comments)    More nervous  . Sulfur Nausea And Vomiting    Patient Measurements: Height: 5\' 8"  (172.7 cm) Weight: 216 lb 4.8 oz (98.113 kg) IBW/kg (Calculated) : 68.4  Vital Signs: Temp: 97.8 F (36.6 C) (06/23 1227) Temp Source: Oral (06/23 1227) BP: 125/80 mmHg (06/23 1227) Pulse Rate: 100 (06/23 1227)  Labs:  Recent Labs  10/16/15 0430 10/17/15 0351 10/17/15 1235 10/18/15 0415  HGB 8.1*  --   --  9.1*  HCT 28.5*  --   --  32.6*  PLT 114*  --   --  123*  LABPROT 18.7* 23.3*  --  24.3*  INR 1.56* 2.09*  --  2.21*  CREATININE 1.58*  --  1.46* 1.46*  CKTOTAL  --   --   --  754*    Estimated Creatinine Clearance: 55 mL/min (by C-G formula based on Cr of 1.46).  Assessment:  Anticoagulation: Coumadin PTA for afib (chadsvasc2 =2). INR subtherapeutic (1.29) on admission. Noted that pt's [facility]. INR 2.21 today. Hgb 9.1, Plts 114>123 --Home dose: 5mg  daily except for 2.5mg  on Wed (last dose 6/18).  Goal of Therapy:  INR 2-3 Monitor platelets by anticoagulation protocol: Yes   Plan: Coumadin 5mg  po daily except 2.5mg /Wed. Daily INR   Bridgit Eynon S. Merilynn Finlandobertson, PharmD, BCPS Clinical Staff Pharmacist Pager [phone]  Misty Stanleyobertson, Hopie Pellegrin Stillinger 10/18/2015,1:01 PM

## 2015-10-18 NOTE — Care Management Note (Signed)
Case Management Note  Patient Details  Name: Ralph ScarceSammy K Hewins MRN: [id number] Date of Birth: 05/04/1945  Subjective/Objective: Pt  admitted with AKI, rhabdomyolysis and sepsis due to UTI, found to have GNR bacteremia. PMH of HTN, PAF on coumadin, Gout (on chronic prednisone), CVA, & CKD. Pt's  wife passed away 3 weeks ago. Pt is from home alone.   Action/Plan:NCM had a long conversation with pt in regards to disposition needs. NCM stressed to pt in regards to being safe and how he could [facility]. Pt is refusing SNF at this time. Per pt he will ask family and church members to stay with him. He stated he could stay with his aunt and uncle if needed. Pt is [facility]able [facility] Agency List provided and choice made. [facility] via [employer] Tim. SOC to begin within 24-48 hours of d/c. Pt will Need HH orders and F2F. No further needs from CM at this time.   Expected Discharge Date:                  Expected Discharge Plan:  Home w Home Health Services  In-House Referral:  Clinical Social Work  Discharge planning Services  CM Consult  Post Acute Care Choice:  Home Health Choice offered to:  Patient  DME Arranged:  N/A DME Agency:  NA  HH Arranged:  RN, PT [facility]  Status of Service:  Completed, signed off  If discussed at [employer] Length of Stay Meetings, dates discussed:    Additional Comments:  Gala LewandowskyGraves-Bigelow, Nico Syme Kaye, RN 10/18/2015, 12:01 PM

## 2015-10-18 NOTE — Telephone Encounter (Signed)
Mrs. Ralph Sullivan is calling because she is asking if Dr. Anne FuSkains can talk to Ralph Sullivan. She is wanting him to speak to him because he is not ready to go home or able to go home by himself .  As of right now he is needing two people to help him get out of the bed . He is currently in the hospital. All she says she is just needing to know whether he will talk to [name] or not .   Thanks

## 2015-10-18 NOTE — Care Management Important Message (Signed)
Important Message  Patient Details  Name: Ralph Sullivan MRN: [id number] Date of Birth: 06/24/1945   Medicare Important Message Given:  Yes    Janda Cargo Abena 10/18/2015, 11:26 AM

## 2015-10-19 LAB — BASIC METABOLIC PANEL
Anion gap: 5 (ref 5–15)
BUN: 19 mg/dL (ref 6–20)
CO2: 23 mmol/L (ref 22–32)
CREATININE: 1.41 mg/dL — AB (ref 0.61–1.24)
Calcium: 8.3 mg/dL — ABNORMAL LOW (ref 8.9–10.3)
Chloride: 108 mmol/L (ref 101–111)
GFR calc Af Amer: 58 mL/min — ABNORMAL LOW (ref >60)
GFR, EST NON AFRICAN AMERICAN: 50 mL/min — AB (ref >60)
GLUCOSE: 125 mg/dL — AB (ref 65–99)
POTASSIUM: 3.9 mmol/L (ref 3.5–5.1)
SODIUM: 136 mmol/L (ref 135–145)

## 2015-10-19 LAB — GLUCOSE, CAPILLARY
Glucose-Capillary: 98 mg/dL (ref 65–99)
Glucose-Capillary: 125 mg/dL — ABNORMAL HIGH (ref 65–99)

## 2015-10-19 LAB — PROTIME-INR
INR: 3.57 — AB (ref 0.00–1.49)
Prothrombin Time: 34.9 seconds — ABNORMAL HIGH (ref 11.6–15.2)

## 2015-10-19 MED ORDER — AMOXICILLIN-POT CLAVULANATE 875-125 MG PO TABS: 1.0000 | ORAL_TABLET | Freq: Two times a day (BID) | ORAL | Status: DC

## 2015-10-19 NOTE — Progress Notes (Signed)
ANTICOAGULATION CONSULT NOTE - Follow Up Consult  Pharmacy Consult for Coumadin Indication: atrial fibrillation  Allergies  Allergen Reactions  . Duloxetine Other (See Comments)    More nervous  . Sulfur Nausea And Vomiting    Patient Measurements: Height: 5\' 8"  (172.7 cm) Weight: 211 lb 9.6 oz (95.981 kg) IBW/kg (Calculated) : 68.4  Vital Signs: Temp: 97.3 F (36.3 C) (06/24 0815) Temp Source: Oral (06/24 0815) BP: 138/92 mmHg (06/24 0841) Pulse Rate: 91 (06/24 0815)  Labs:  Recent Labs  10/17/15 0351 10/17/15 1235 10/18/15 0415 10/19/15 0234  HGB  --   --  9.1*  --   HCT  --   --  32.6*  --   PLT  --   --  123*  --   LABPROT 23.3*  --  24.3* 34.9*  INR 2.09*  --  2.21* 3.57*  CREATININE  --  1.46* 1.46* 1.41*  CKTOTAL  --   --  754*  --     Estimated Creatinine Clearance: 56.3 mL/min (by C-G formula based on Cr of 1.41).  Assessment: Coumadin PTA for afib (chadsvasc=2). INR subtherapeutic (1.29) on admission. Patient[facility].5[facility] INR 2.21>3.57 today on PTA dose. Last CBC 6/23: Hgb 9.1, Plts 123. No overt bleeding reported.   DDI: Chronic prednisone  PTA dose: 5mg  daily except for 2.5mg  on Wed (last dose 6/18).  Goal of Therapy:  INR 2-3 Monitor platelets by anticoagulation protocol: Yes   Plan: Hold coumadin tonight Daily INR Monitor CBC, s/sx bleeding  Sherle Poeob Vincent, PharmD Clinical Pharmacy Resident 9:21 AM, 10/19/2015

## 2015-10-19 NOTE — Progress Notes (Addendum)
Occupational Therapy Treatment Patient Details Name: Ralph Sullivan MRN: [id number] DOB: 09/13/1945 Today's Date: 10/19/2015    History of present illness 70 y.o. Male with PMH of HTN, PAF on coumadin, Gout (on chronic prednisone), CVA, CKD, whose wife passed away 3 weeks ago, apparently fell in the kitchen and was unable to get up. Larey SeatFell about 9pm Sunday. Admitted with AKI, rhabdomyolysis and sepsis due to UTI, found to have GNR bacteremia   OT comments  Pt. Able to complete LB dressing tasks with focus on energy conservation techniques and utilization of rest breaks.  Able to demonstrate and verbalize techniques during session.  Pt. States his elevated HR is due to being "nervous" around doctors, therapists ect.  Also states he feels increased HR when he is missing his wife. (she passed away beginning of June).   Provided emotional support and encouraged him to reach out and talk about his grieving process. Eager for d/c home.  Reports he has family that will be with him.    Follow Up Recommendations  Home health OT;Supervision/Assistance - 24 hour    Equipment Recommendations  3 in 1 bedside comode    Recommendations for Other Services      Precautions / Restrictions Precautions Precautions: Fall Precaution Comments: Watch HR, and BP Restrictions Weight Bearing Restrictions: No       Mobility Bed Mobility                  Transfers                      Balance                                   ADL Overall ADL's : Needs assistance/impaired                     Lower Body Dressing: Sitting/lateral leans;Set up;Cueing for compensatory techniques Lower Body Dressing Details (indicate cue type and reason): pt. required cues for taking rest breaks during LB dressing task               General ADL Comments: pts. HR increased to 106 during LB dressing task of don/doff socks while seated crossing L/R leg over each knee.  educated on  energy conservation techniques.  reviewed sitting and initiating rest breaks.  states "i get nevous around yall and doctors, i always have".  reviewed it was okay and i was only here to make sure he was safe.   pt. tearful on/off during session due to recent loss of wife.  states "when i think about her it makes my heart beat faster".  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  discussed talking with someone and/or family about his feelings. encouraged him to talk and that crying was okay.        Vision                     Perception     Praxis      Cognition     Overall Cognitive Status: Within Functional Limits for tasks assessed                       Extremity/Trunk Assessment  Exercises     Shoulder Instructions       General Comments      Pertinent Vitals/ Pain       Pain Assessment: No/denies pain  Home Living                                          Prior Functioning/Environment              Frequency Min 3X/week     Progress Toward Goals  OT Goals(current goals can now be found in the care plan section)  Progress towards OT goals: Progressing toward goals     Plan Discharge plan remains appropriate    Co-evaluation                 End of Session     Activity Tolerance Patient tolerated treatment well   Patient Left in chair;with call bell/phone within reach (nursing student in the room with pt. at end of session)   Nurse Communication          Time: [phone] [id number] OT Time Calculation (min): 19 min  Charges: OT General Charges $OT Visit: 1 Procedure OT Treatments $Self Care/Home Management : 8-22 mins  Robet LeuMorris, Don Giarrusso Lorraine, COTA/L 10/19/2015, 11:38 AM

## 2015-10-19 NOTE — Progress Notes (Signed)
CM received call from RN stating discharge of pt.  [facility] and CM notified [facility] rep of discharge; orders and face to face are in EPIC.  No other CM needs were communicated.

## 2015-10-19 NOTE — Discharge Summary (Signed)
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 ONLY   Final   Culture (A)  Final    ESCHERICHIA COLI SUSCEPTIBILITIES PERFORMED ON PREVIOUS CULTURE WITHIN THE LAST 5 DAYS.    Report Status 10/17/2015 FINAL  Final  Urine culture     Status: Abnormal   Collection Time: 10/14/15  8:20 PM  Result Value Ref Range Status   Specimen Description URINE, CLEAN CATCH  Final   Special Requests NONE  Final   Culture >=100,000 COLONIES/mL ESCHERICHIA COLI (A)  Final   Report Status 10/17/2015 FINAL  Final   Organism ID, Bacteria ESCHERICHIA COLI (A)  Final      Susceptibility   Escherichia coli - MIC*    AMPICILLIN <=2 SENSITIVE Sensitive     CEFAZOLIN <=4 SENSITIVE Sensitive     CEFTRIAXONE <=1 SENSITIVE Sensitive     CIPROFLOXACIN <=0.25 SENSITIVE Sensitive     GENTAMICIN <=1 SENSITIVE Sensitive     IMIPENEM  <=0.25 SENSITIVE Sensitive     NITROFURANTOIN <=16 SENSITIVE Sensitive     TRIMETH/SULFA <=20 SENSITIVE Sensitive     AMPICILLIN/SULBACTAM <=2 SENSITIVE Sensitive     PIP/TAZO <=4 SENSITIVE Sensitive     * >=100,000 COLONIES/mL ESCHERICHIA COLI  MRSA PCR Screening     Status: None   Collection Time: 10/14/15 11:45 PM  Result Value Ref Range Status   MRSA by PCR NEGATIVE NEGATIVE Final    Comment:        The GeneXpert MRSA Assay (FDA approved for NASAL specimens only), is one component of a comprehensive MRSA colonization surveillance program. It is not intended to diagnose MRSA infection nor to guide or monitor treatment for MRSA infections.   Culture, blood (Routine X 2) w Reflex to ID Panel     Status: None (Preliminary result)   Collection Time: 10/17/15 12:36 PM  Result Value Ref Range Status   Specimen Description BLOOD LEFT ANTECUBITAL  Final   Special Requests BOTTLES DRAWN AEROBIC ONLY 7CC  Final   Culture NO GROWTH 1 DAY  Final   Report Status PENDING  Incomplete  Culture, blood (Routine X 2) w Reflex to ID Panel     Status: None (Preliminary result)   Collection Time: 10/17/15 12:40 PM  Result Value Ref Range Status   Specimen Description BLOOD LEFT HAND  Final   Special Requests BOTTLES DRAWN AEROBIC AND ANAEROBIC 5CC  Final   Culture NO GROWTH 1 DAY  Final   Report Status PENDING  Incomplete     Time coordinating discharge: Over 30 minutes  SIGNED:   Kathlen ModyAKULA,Joanny Dupree, MD  [facility] 10/19/2015, 11:52 AM Pager [phone]  If 7PM-7AM, please contact night-coverage [email or web address] Password [email or web address]

## 2015-10-20 DIAGNOSIS — M109 Gout, unspecified: Secondary | ICD-10-CM | POA: Diagnosis not present

## 2015-10-20 DIAGNOSIS — N390 Urinary tract infection, site not specified: Secondary | ICD-10-CM | POA: Diagnosis not present

## 2015-10-20 DIAGNOSIS — I481 Persistent atrial fibrillation: Secondary | ICD-10-CM | POA: Diagnosis not present

## 2015-10-20 DIAGNOSIS — B9620 Unspecified Escherichia coli [E. coli] as the cause of diseases classified elsewhere: Secondary | ICD-10-CM | POA: Diagnosis not present

## 2015-10-20 DIAGNOSIS — I129 Hypertensive chronic kidney disease with stage 1 through stage 4 chronic kidney disease, or unspecified chronic kidney disease: Secondary | ICD-10-CM | POA: Diagnosis not present

## 2015-10-20 DIAGNOSIS — N183 Chronic kidney disease, stage 3 (moderate): Secondary | ICD-10-CM | POA: Diagnosis not present

## 2015-10-22 ENCOUNTER — Telehealth: Payer: Self-pay | Admitting: Cardiology

## 2015-10-22 ENCOUNTER — Ambulatory Visit (INDEPENDENT_AMBULATORY_CARE_PROVIDER_SITE_OTHER): Payer: Medicare Other | Admitting: Cardiovascular Disease

## 2015-10-22 DIAGNOSIS — N183 Chronic kidney disease, stage 3 (moderate): Secondary | ICD-10-CM | POA: Diagnosis not present

## 2015-10-22 DIAGNOSIS — I481 Persistent atrial fibrillation: Secondary | ICD-10-CM

## 2015-10-22 DIAGNOSIS — B9620 Unspecified Escherichia coli [E. coli] as the cause of diseases classified elsewhere: Secondary | ICD-10-CM | POA: Diagnosis not present

## 2015-10-22 DIAGNOSIS — Z5181 Encounter for therapeutic drug level monitoring: Secondary | ICD-10-CM

## 2015-10-22 DIAGNOSIS — I4819 Other persistent atrial fibrillation: Secondary | ICD-10-CM

## 2015-10-22 DIAGNOSIS — I129 Hypertensive chronic kidney disease with stage 1 through stage 4 chronic kidney disease, or unspecified chronic kidney disease: Secondary | ICD-10-CM | POA: Diagnosis not present

## 2015-10-22 DIAGNOSIS — N390 Urinary tract infection, site not specified: Secondary | ICD-10-CM | POA: Diagnosis not present

## 2015-10-22 DIAGNOSIS — M109 Gout, unspecified: Secondary | ICD-10-CM | POA: Diagnosis not present

## 2015-10-22 LAB — CULTURE, BLOOD (ROUTINE X 2)
Culture: NO GROWTH
Culture: NO GROWTH

## 2015-10-22 LAB — POCT INR: INR: 2.1

## 2015-10-22 NOTE — Telephone Encounter (Signed)
Spoke with Marchelle FolksAmanda.  Gave orders to check INR at the next home health visit.  She is unsure of exactly what day it will be this week.  They have our direct phone number to call the results.

## 2015-10-22 NOTE — Telephone Encounter (Signed)
New message     The home health nurse needs a order to check the patient's PT/INR at home and the home health needs to know when the next one is going to be done. Thanks

## 2015-10-23 DIAGNOSIS — N183 Chronic kidney disease, stage 3 (moderate): Secondary | ICD-10-CM | POA: Diagnosis not present

## 2015-10-23 DIAGNOSIS — I129 Hypertensive chronic kidney disease with stage 1 through stage 4 chronic kidney disease, or unspecified chronic kidney disease: Secondary | ICD-10-CM | POA: Diagnosis not present

## 2015-10-23 DIAGNOSIS — B9620 Unspecified Escherichia coli [E. coli] as the cause of diseases classified elsewhere: Secondary | ICD-10-CM | POA: Diagnosis not present

## 2015-10-23 DIAGNOSIS — I481 Persistent atrial fibrillation: Secondary | ICD-10-CM | POA: Diagnosis not present

## 2015-10-23 DIAGNOSIS — N390 Urinary tract infection, site not specified: Secondary | ICD-10-CM | POA: Diagnosis not present

## 2015-10-23 DIAGNOSIS — M109 Gout, unspecified: Secondary | ICD-10-CM | POA: Diagnosis not present

## 2015-10-25 DIAGNOSIS — I129 Hypertensive chronic kidney disease with stage 1 through stage 4 chronic kidney disease, or unspecified chronic kidney disease: Secondary | ICD-10-CM | POA: Diagnosis not present

## 2015-10-25 DIAGNOSIS — B9620 Unspecified Escherichia coli [E. coli] as the cause of diseases classified elsewhere: Secondary | ICD-10-CM | POA: Diagnosis not present

## 2015-10-25 DIAGNOSIS — M109 Gout, unspecified: Secondary | ICD-10-CM | POA: Diagnosis not present

## 2015-10-25 DIAGNOSIS — N183 Chronic kidney disease, stage 3 (moderate): Secondary | ICD-10-CM | POA: Diagnosis not present

## 2015-10-25 DIAGNOSIS — I481 Persistent atrial fibrillation: Secondary | ICD-10-CM | POA: Diagnosis not present

## 2015-10-25 DIAGNOSIS — N390 Urinary tract infection, site not specified: Secondary | ICD-10-CM | POA: Diagnosis not present

## 2015-10-27 DIAGNOSIS — B9620 Unspecified Escherichia coli [E. coli] as the cause of diseases classified elsewhere: Secondary | ICD-10-CM | POA: Diagnosis not present

## 2015-10-27 DIAGNOSIS — M109 Gout, unspecified: Secondary | ICD-10-CM | POA: Diagnosis not present

## 2015-10-27 DIAGNOSIS — I129 Hypertensive chronic kidney disease with stage 1 through stage 4 chronic kidney disease, or unspecified chronic kidney disease: Secondary | ICD-10-CM | POA: Diagnosis not present

## 2015-10-27 DIAGNOSIS — N183 Chronic kidney disease, stage 3 (moderate): Secondary | ICD-10-CM | POA: Diagnosis not present

## 2015-10-27 DIAGNOSIS — I481 Persistent atrial fibrillation: Secondary | ICD-10-CM | POA: Diagnosis not present

## 2015-10-27 DIAGNOSIS — N390 Urinary tract infection, site not specified: Secondary | ICD-10-CM | POA: Diagnosis not present

## 2015-10-30 DIAGNOSIS — N390 Urinary tract infection, site not specified: Secondary | ICD-10-CM | POA: Diagnosis not present

## 2015-10-30 DIAGNOSIS — N179 Acute kidney failure, unspecified: Secondary | ICD-10-CM | POA: Diagnosis not present

## 2015-10-30 DIAGNOSIS — E1322 Other specified diabetes mellitus with diabetic chronic kidney disease: Secondary | ICD-10-CM | POA: Diagnosis not present

## 2015-10-30 DIAGNOSIS — M6282 Rhabdomyolysis: Secondary | ICD-10-CM | POA: Diagnosis not present

## 2015-10-30 DIAGNOSIS — A4151 Sepsis due to Escherichia coli [E. coli]: Secondary | ICD-10-CM | POA: Diagnosis not present

## 2015-10-30 DIAGNOSIS — N183 Chronic kidney disease, stage 3 (moderate): Secondary | ICD-10-CM | POA: Diagnosis not present

## 2015-11-01 DIAGNOSIS — N183 Chronic kidney disease, stage 3 (moderate): Secondary | ICD-10-CM | POA: Diagnosis not present

## 2015-11-01 DIAGNOSIS — M109 Gout, unspecified: Secondary | ICD-10-CM | POA: Diagnosis not present

## 2015-11-01 DIAGNOSIS — I481 Persistent atrial fibrillation: Secondary | ICD-10-CM | POA: Diagnosis not present

## 2015-11-01 DIAGNOSIS — B9620 Unspecified Escherichia coli [E. coli] as the cause of diseases classified elsewhere: Secondary | ICD-10-CM | POA: Diagnosis not present

## 2015-11-01 DIAGNOSIS — N390 Urinary tract infection, site not specified: Secondary | ICD-10-CM | POA: Diagnosis not present

## 2015-11-01 DIAGNOSIS — I129 Hypertensive chronic kidney disease with stage 1 through stage 4 chronic kidney disease, or unspecified chronic kidney disease: Secondary | ICD-10-CM | POA: Diagnosis not present

## 2015-11-05 ENCOUNTER — Ambulatory Visit (INDEPENDENT_AMBULATORY_CARE_PROVIDER_SITE_OTHER): Payer: Medicare Other | Admitting: Cardiology

## 2015-11-05 DIAGNOSIS — I481 Persistent atrial fibrillation: Secondary | ICD-10-CM | POA: Diagnosis not present

## 2015-11-05 DIAGNOSIS — N183 Chronic kidney disease, stage 3 (moderate): Secondary | ICD-10-CM | POA: Diagnosis not present

## 2015-11-05 DIAGNOSIS — N390 Urinary tract infection, site not specified: Secondary | ICD-10-CM | POA: Diagnosis not present

## 2015-11-05 DIAGNOSIS — Z5181 Encounter for therapeutic drug level monitoring: Secondary | ICD-10-CM

## 2015-11-05 DIAGNOSIS — I129 Hypertensive chronic kidney disease with stage 1 through stage 4 chronic kidney disease, or unspecified chronic kidney disease: Secondary | ICD-10-CM | POA: Diagnosis not present

## 2015-11-05 DIAGNOSIS — I4819 Other persistent atrial fibrillation: Secondary | ICD-10-CM

## 2015-11-05 DIAGNOSIS — M109 Gout, unspecified: Secondary | ICD-10-CM | POA: Diagnosis not present

## 2015-11-05 DIAGNOSIS — B9620 Unspecified Escherichia coli [E. coli] as the cause of diseases classified elsewhere: Secondary | ICD-10-CM | POA: Diagnosis not present

## 2015-11-05 LAB — POCT INR: INR: 6.2

## 2015-11-06 DIAGNOSIS — B9620 Unspecified Escherichia coli [E. coli] as the cause of diseases classified elsewhere: Secondary | ICD-10-CM | POA: Diagnosis not present

## 2015-11-06 DIAGNOSIS — I129 Hypertensive chronic kidney disease with stage 1 through stage 4 chronic kidney disease, or unspecified chronic kidney disease: Secondary | ICD-10-CM | POA: Diagnosis not present

## 2015-11-06 DIAGNOSIS — N183 Chronic kidney disease, stage 3 (moderate): Secondary | ICD-10-CM | POA: Diagnosis not present

## 2015-11-06 DIAGNOSIS — Z7901 Long term (current) use of anticoagulants: Secondary | ICD-10-CM | POA: Diagnosis not present

## 2015-11-06 DIAGNOSIS — I481 Persistent atrial fibrillation: Secondary | ICD-10-CM | POA: Diagnosis not present

## 2015-11-06 DIAGNOSIS — M109 Gout, unspecified: Secondary | ICD-10-CM | POA: Diagnosis not present

## 2015-11-06 DIAGNOSIS — N390 Urinary tract infection, site not specified: Secondary | ICD-10-CM | POA: Diagnosis not present

## 2015-11-06 LAB — PROTIME-INR: INR: 3.5 — AB (ref 0.9–1.1)

## 2015-11-08 DIAGNOSIS — N390 Urinary tract infection, site not specified: Secondary | ICD-10-CM | POA: Diagnosis not present

## 2015-11-08 DIAGNOSIS — B9620 Unspecified Escherichia coli [E. coli] as the cause of diseases classified elsewhere: Secondary | ICD-10-CM | POA: Diagnosis not present

## 2015-11-08 DIAGNOSIS — I481 Persistent atrial fibrillation: Secondary | ICD-10-CM | POA: Diagnosis not present

## 2015-11-08 DIAGNOSIS — I129 Hypertensive chronic kidney disease with stage 1 through stage 4 chronic kidney disease, or unspecified chronic kidney disease: Secondary | ICD-10-CM | POA: Diagnosis not present

## 2015-11-08 DIAGNOSIS — M109 Gout, unspecified: Secondary | ICD-10-CM | POA: Diagnosis not present

## 2015-11-08 DIAGNOSIS — N183 Chronic kidney disease, stage 3 (moderate): Secondary | ICD-10-CM | POA: Diagnosis not present

## 2015-11-12 ENCOUNTER — Encounter (HOSPITAL_COMMUNITY): Payer: Self-pay | Admitting: Emergency Medicine

## 2015-11-12 ENCOUNTER — Emergency Department (HOSPITAL_COMMUNITY)
Admission: EM | Admit: 2015-11-12 | Discharge: 2015-11-12 | Disposition: A | Discharge status: Home / Self Care | Payer: Medicare Other | Attending: Emergency Medicine | Admitting: Emergency Medicine

## 2015-11-12 ENCOUNTER — Emergency Department (HOSPITAL_BASED_OUTPATIENT_CLINIC_OR_DEPARTMENT_OTHER): Admit: 2015-11-12 | Discharge: 2015-11-12 | Disposition: A | Discharge status: Home / Self Care | Payer: Medicare Other

## 2015-11-12 DIAGNOSIS — N183 Chronic kidney disease, stage 3 (moderate): Secondary | ICD-10-CM | POA: Insufficient documentation

## 2015-11-12 DIAGNOSIS — R791 Abnormal coagulation profile: Secondary | ICD-10-CM | POA: Diagnosis not present

## 2015-11-12 DIAGNOSIS — D68318 Other hemorrhagic disorder due to intrinsic circulating anticoagulants, antibodies, or inhibitors: Secondary | ICD-10-CM | POA: Diagnosis not present

## 2015-11-12 DIAGNOSIS — Z7901 Long term (current) use of anticoagulants: Secondary | ICD-10-CM | POA: Insufficient documentation

## 2015-11-12 DIAGNOSIS — Z8673 Personal history of transient ischemic attack (TIA), and cerebral infarction without residual deficits: Secondary | ICD-10-CM | POA: Insufficient documentation

## 2015-11-12 DIAGNOSIS — M79604 Pain in right leg: Secondary | ICD-10-CM | POA: Diagnosis not present

## 2015-11-12 DIAGNOSIS — Z5181 Encounter for therapeutic drug level monitoring: Secondary | ICD-10-CM

## 2015-11-12 DIAGNOSIS — Z87891 Personal history of nicotine dependence: Secondary | ICD-10-CM | POA: Insufficient documentation

## 2015-11-12 LAB — CBC WITH DIFFERENTIAL/PLATELET
Basophils Absolute: 0.1 10*3/uL (ref 0.0–0.1)
Basophils Relative: 0 %
EOS ABS: 0.3 10*3/uL (ref 0.0–0.7)
EOS PCT: 2 %
HCT: 34.2 % — ABNORMAL LOW (ref 39.0–52.0)
Hemoglobin: 10 g/dL — ABNORMAL LOW (ref 13.0–17.0)
LYMPHS ABS: 0.8 10*3/uL (ref 0.7–4.0)
Lymphocytes Relative: 7 %
MCH: 22.4 pg — AB (ref 26.0–34.0)
MCHC: 29.2 g/dL — AB (ref 30.0–36.0)
MCV: 76.7 fL — ABNORMAL LOW (ref 78.0–100.0)
MONOS PCT: 6 %
Monocytes Absolute: 0.7 10*3/uL (ref 0.1–1.0)
Neutro Abs: 9.9 10*3/uL — ABNORMAL HIGH (ref 1.7–7.7)
Neutrophils Relative %: 85 %
PLATELETS: 192 10*3/uL (ref 150–400)
RBC: 4.46 MIL/uL (ref 4.22–5.81)
RDW: 18.5 % — ABNORMAL HIGH (ref 11.5–15.5)
WBC: 11.7 10*3/uL — AB (ref 4.0–10.5)

## 2015-11-12 LAB — COMPREHENSIVE METABOLIC PANEL
ALT: 18 U/L (ref 17–63)
ANION GAP: 9 (ref 5–15)
AST: 32 U/L (ref 15–41)
Albumin: 3 g/dL — ABNORMAL LOW (ref 3.5–5.0)
Alkaline Phosphatase: 84 U/L (ref 38–126)
BUN: 17 mg/dL (ref 6–20)
CALCIUM: 9.3 mg/dL (ref 8.9–10.3)
CHLORIDE: 101 mmol/L (ref 101–111)
CO2: 25 mmol/L (ref 22–32)
Creatinine, Ser: 1.69 mg/dL — ABNORMAL HIGH (ref 0.61–1.24)
GFR calc non Af Amer: 40 mL/min — ABNORMAL LOW (ref >60)
GFR, EST AFRICAN AMERICAN: 46 mL/min — AB (ref >60)
Glucose, Bld: 122 mg/dL — ABNORMAL HIGH (ref 65–99)
Potassium: 5.2 mmol/L — ABNORMAL HIGH (ref 3.5–5.1)
SODIUM: 135 mmol/L (ref 135–145)
Total Bilirubin: 0.7 mg/dL (ref 0.3–1.2)
Total Protein: 6.2 g/dL — ABNORMAL LOW (ref 6.5–8.1)

## 2015-11-12 LAB — PROTIME-INR
INR: 1.57 — AB (ref 0.00–1.49)
PROTHROMBIN TIME: 18.8 s — AB (ref 11.6–15.2)

## 2015-11-12 LAB — CK: Total CK: 114 U/L (ref 49–397)

## 2015-11-12 MED ORDER — METHOCARBAMOL 500 MG PO TABS: 500.0000 mg | ORAL_TABLET | Freq: Two times a day (BID) | ORAL | Status: DC

## 2015-11-12 MED ORDER — METHOCARBAMOL 500 MG PO TABS
500.0000 mg | ORAL_TABLET | Freq: Once | ORAL | Status: AC
  Administered 2015-11-12: 500 mg via ORAL
  Filled 2015-11-12: qty 1

## 2015-11-12 MED ORDER — FENTANYL CITRATE (PF) 100 MCG/2ML IJ SOLN
50.0000 ug | Freq: Once | INTRAMUSCULAR | Status: AC
Start: 2015-11-12 — End: 2015-11-12
  Administered 2015-11-12: 50 ug via INTRAVENOUS
  Filled 2015-11-12: qty 2

## 2015-11-12 NOTE — Discharge Instructions (Signed)
Take the prescribed medication as directed.  You can take this with the vicodin if needed. Your INR was low today.  You need to have this re-checked soon. Follow-up with your primary care doctor. Return to the ED for new or worsening symptoms.

## 2015-11-12 NOTE — ED Provider Notes (Signed)
CSN: [id number]     Arrival date & time 11/12/15  0848 History   First MD Initiated Contact with Patient 11/12/15 0919     Chief Complaint  Patient presents with  . Leg Pain     (Consider location/radiation/quality/duration/timing/severity/associated sxs/prior Treatment) The history is provided by the patient and medical records.    70 year old male With history of obesity, A. fib, stroke, chronic kidney disease, presenting to the ED for right leg pain. Patient was hospitalized in June for acute kidney injury, rhabdomyolysis, and Escherichia coli bacteremia. He improved and ultimately was able to be discharged home. His follow-up blood cultures were negative. He states since leaving the hospital he has overall been doing well, aside from his right leg. He states his right leg has been aching along his quadriceps. He states pain is worse with standing and weightbearing. States he feels like his muscle is tight and just wont "turn loose".  He is not having any other generalized muscle pain. He denies any numbness or weakness of his right leg. Patient is on chronic Coumadin, states he's been taking this as directed. He was off of his Coumadin for 2 days earlier this month because he was told his level was too high. He states he last had this checked 3 weeks ago and it was 3.5. He denies any history of DVT or PE. She has no chest pain or shortness of breath. No fever or chills. No new injury or falls. He is taking the hydrocodone he was prescribed from the hospital, however it is not providing him any relief.  Past Medical History  Diagnosis Date  . Obesity, unspecified 03/28/2013  . Atrial fibrillation (HCC) 02/08/2013    CHADS-VAS - 2 (AGE, HTN). On anticoagulation. Holter 6/14 - 2.3 sec pause. Avg HR 87. Rate control   . Chronic anticoagulation 03/28/2013  . Hyperhidrosis 03/28/2013  . Dilated aortic root (HCC) 03/28/2013    6/14-4.5cm sinus of Valsalva, 4.2 cm sinotubular junction  . Bifascicular  block 03/28/2013  . Gout   . Bilateral congenital hammer toes   . 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 Rhabdomyolysis 09/2015  . Chronic kidney disease, stage III (moderate) 03/28/2013   Past Surgical History  Procedure Laterality Date  . Hernia repair    . Kidney stone surgery    . Cataract extraction     Family History  Problem Relation Age of Onset  . Hypertension Mother   . Diabetes Mother    Social History  Substance Use Topics  . Smoking status: Former Smoker    Quit date: 03/28/1945  . Smokeless tobacco: Never Used  . Alcohol Use: No    Review of Systems  Musculoskeletal: Positive for arthralgias.  All other systems reviewed and are negative.     Allergies  Duloxetine and Sulfur  Home Medications   Prior to Admission medications   Medication Sig Start Date End Date Taking? Authorizing Provider  amoxicillin-clavulanate (AUGMENTIN) 875-125 MG tablet Take 1 tablet by mouth 2 (two) times daily. 10/19/15   Kathlen ModyVijaya Akula, MD  CHROMIUM-CINNAMON PO Take 200 mg by mouth at bedtime.     Historical Provider, MD  cloNIDine (CATAPRES) 0.2 MG tablet Take 0.2 mg by mouth 2 (two) times daily.  03/08/13   Historical Provider, MD  fluticasone (FLONASE) 50 MCG/ACT nasal spray Place 2 sprays into both nostrils daily.  09/28/14   Historical Provider, MD  HYDROcodone-acetaminophen (NORCO/VICODIN) 5-325 MG tablet Take 1 tablet by mouth  every 6 (six) hours as needed for moderate pain.  09/16/15   Historical Provider, MD  loratadine (CLARITIN) 10 MG tablet Take 10 mg by mouth daily.    Historical Provider, MD  metoprolol (LOPRESSOR) 100 MG tablet Take 200 mg by mouth 2 (two) times daily.  01/05/13   Historical Provider, MD  Omega-3 Fatty Acids (OMEGA 3 PO) Take 1 capsule by mouth 2 (two) times daily.     Historical Provider, MD  predniSONE (DELTASONE) 5 MG tablet Take 10 mg by mouth daily with breakfast.     Historical Provider, MD  vitamin E 400 UNIT capsule Take 400 Units  by mouth daily.    Historical Provider, MD  warfarin (COUMADIN) 5 MG tablet TAKE AS DIRECTED BY COUMADIN CLINIC Patient taking differently: TAKE 2.5 on Wednesdays.  5 mg all other days 07/29/15   Jake Bathe, MD   BP 128/100 mmHg  Pulse 88  Temp(Src) 98.4 F (36.9 C) (Oral)  Resp 18  Ht 5\' 10"  (1.778 m)  Wt 90.719 kg  BMI 28.70 kg/m2  SpO2 97%   Physical Exam  Constitutional: He is oriented to person, place, and time. He appears well-developed and well-nourished. No distress.  HENT:  Head: Normocephalic and atraumatic.  Mouth/Throat: Oropharynx is clear and moist.  Eyes: Conjunctivae and EOM are normal. Pupils are equal, round, and reactive to light.  Neck: Normal range of motion. Neck supple.  Cardiovascular: Normal rate, regular rhythm and normal heart sounds.   Pulmonary/Chest: Effort normal and breath sounds normal. No respiratory distress. He has no wheezes.  Abdominal: Soft. Bowel sounds are normal. There is no tenderness. There is no guarding.  Musculoskeletal: Normal range of motion. He exhibits no edema.  Right quadriceps does appear prominent along the lateral aspect with some tension when compared with left leg; no significant swelling, erythema, induration, or warmth to touch; DP pulse intact; normal sensation throughout right leg  Neurological: He is alert and oriented to person, place, and time.  Skin: Skin is warm and dry. He is not diaphoretic.  Psychiatric: He has a normal mood and affect.  Nursing note and vitals reviewed.   ED Course  Procedures (including critical care time) Labs Review Results for orders placed or performed during the hospital encounter of 11/12/15  CBC with Differential  Result Value Ref Range   WBC 11.7 (H) 4.0 - 10.5 K/uL   RBC 4.46 4.22 - 5.81 MIL/uL   Hemoglobin 10.0 (L) 13.0 - 17.0 g/dL   HCT 16.1 (L) 09.6 - 04.5 %   MCV 76.7 (L) 78.0 - 100.0 fL   MCH 22.4 (L) 26.0 - 34.0 pg   MCHC 29.2 (L) 30.0 - 36.0 g/dL   RDW 40.9 (H) 81.1 -  15.5 %   Platelets 192 150 - 400 K/uL   Neutrophils Relative % 85 %   Neutro Abs 9.9 (H) 1.7 - 7.7 K/uL   Lymphocytes Relative 7 %   Lymphs Abs 0.8 0.7 - 4.0 K/uL   Monocytes Relative 6 %   Monocytes Absolute 0.7 0.1 - 1.0 K/uL   Eosinophils Relative 2 %   Eosinophils Absolute 0.3 0.0 - 0.7 K/uL   Basophils Relative 0 %   Basophils Absolute 0.1 0.0 - 0.1 K/uL  Comprehensive metabolic panel  Result Value Ref Range   Sodium 135 135 - 145 mmol/L   Potassium 5.2 (H) 3.5 - 5.1 mmol/L   Chloride 101 101 - 111 mmol/L   CO2 25 22 - 32 mmol/L  Glucose, Bld 122 (H) 65 - 99 mg/dL   BUN 17 6 - 20 mg/dL   Creatinine, Ser 1.61 (H) 0.61 - 1.24 mg/dL   Calcium 9.3 8.9 - 09.6 mg/dL   Total Protein 6.2 (L) 6.5 - 8.1 g/dL   Albumin 3.0 (L) 3.5 - 5.0 g/dL   AST 32 15 - 41 U/L   ALT 18 17 - 63 U/L   Alkaline Phosphatase 84 38 - 126 U/L   Total Bilirubin 0.7 0.3 - 1.2 mg/dL   GFR calc non Af Amer 40 (L) >60 mL/min   GFR calc Af Amer 46 (L) >60 mL/min   Anion gap 9 5 - 15  CK  Result Value Ref Range   Total CK 114 49 - 397 U/L    Imaging Review No results found.   *Preliminary Results* Right lower extremity venous duplex completed. Right lower extremity is negative for deep vein thrombosis. There is no evidence of right Baker's cyst.  11/12/2015 10:47 AM  Gertie Fey, RVT, RDCS, RDMS   I have personally reviewed and evaluated these images and lab results as part of my medical decision-making.   EKG Interpretation None      MDM   Final diagnoses:  Right leg pain  Subtherapeutic anticoagulation   70 year old male here with right leg pain. Recently hospitalized for rhabdomyolysis, acute kidney injury, and Escherichia coli bacteremia. He is afebrile and nontoxic currently. He complains of pain in the right lateral quadriceps which does appear somewhat prominent and tense on exam. There is no swelling, induration, warmth to touch, erythema, or evidence of cellulitis. Patient  is on chronic Coumadin, no history of DVT, however he admits to being off of this medication for a few days and had a recent hospital stay. Will repeat labs and obtain venous duplex to ensure no DVT.  There has not been any recent fall and no deformities or swelling noted on exam so i feel acute fracture or dislocation is less likely.    Labs as above. INR is subtherapeutic. Renal function appears close to patient's baseline given his stage III CKD. CK is within normal limits.  Venous duplex negative for DVT.  No chest pain or SOB currently to suggest acute PE, ACS.  Patient reports muscle tension has improved with Robaxin here in the ED. Will discharge home with same. Started to follow-up with his PCP for recheck of his INR.  Discussed plan with patient, he/she acknowledged understanding and agreed with plan of care.  Return precautions given for new or worsening symptoms.  Case discussed with attending physician, Dr. Adriana Simas, who evaluated patient and agrees with assessment and plan of care.  Garlon Hatchet, PA-C 11/12/15 1503  Garlon Hatchet, PA-C 11/12/15 1531  Donnetta Hutching, MD 11/14/15 2155

## 2015-11-12 NOTE — ED Notes (Signed)
NAD at this time. Pt is stable and going home.  

## 2015-11-12 NOTE — ED Notes (Signed)
Pt sts right leg pain running down right side of buttocks and down right leg; pt recently hospitalized for sepsis

## 2015-11-12 NOTE — Progress Notes (Signed)
*  Preliminary Results* Right lower extremity venous duplex completed. Right lower extremity is negative for deep vein thrombosis. There is no evidence of right Baker's cyst.  11/12/2015 10:47 AM  Gertie FeyMichelle Veleda Mun, RVT, RDCS, RDMS

## 2015-11-13 ENCOUNTER — Ambulatory Visit (INDEPENDENT_AMBULATORY_CARE_PROVIDER_SITE_OTHER): Payer: Medicare Other | Admitting: Cardiology

## 2015-11-13 DIAGNOSIS — B9620 Unspecified Escherichia coli [E. coli] as the cause of diseases classified elsewhere: Secondary | ICD-10-CM | POA: Diagnosis not present

## 2015-11-13 DIAGNOSIS — I129 Hypertensive chronic kidney disease with stage 1 through stage 4 chronic kidney disease, or unspecified chronic kidney disease: Secondary | ICD-10-CM | POA: Diagnosis not present

## 2015-11-13 DIAGNOSIS — N390 Urinary tract infection, site not specified: Secondary | ICD-10-CM | POA: Diagnosis not present

## 2015-11-13 DIAGNOSIS — I4819 Other persistent atrial fibrillation: Secondary | ICD-10-CM

## 2015-11-13 DIAGNOSIS — M109 Gout, unspecified: Secondary | ICD-10-CM | POA: Diagnosis not present

## 2015-11-13 DIAGNOSIS — N183 Chronic kidney disease, stage 3 (moderate): Secondary | ICD-10-CM | POA: Diagnosis not present

## 2015-11-13 DIAGNOSIS — I481 Persistent atrial fibrillation: Secondary | ICD-10-CM | POA: Diagnosis not present

## 2015-11-13 DIAGNOSIS — Z5181 Encounter for therapeutic drug level monitoring: Secondary | ICD-10-CM

## 2015-11-13 LAB — POCT INR: INR: 1.5

## 2015-11-14 DIAGNOSIS — M109 Gout, unspecified: Secondary | ICD-10-CM | POA: Diagnosis not present

## 2015-11-14 DIAGNOSIS — N183 Chronic kidney disease, stage 3 (moderate): Secondary | ICD-10-CM | POA: Diagnosis not present

## 2015-11-14 DIAGNOSIS — B9620 Unspecified Escherichia coli [E. coli] as the cause of diseases classified elsewhere: Secondary | ICD-10-CM | POA: Diagnosis not present

## 2015-11-14 DIAGNOSIS — I481 Persistent atrial fibrillation: Secondary | ICD-10-CM | POA: Diagnosis not present

## 2015-11-14 DIAGNOSIS — I129 Hypertensive chronic kidney disease with stage 1 through stage 4 chronic kidney disease, or unspecified chronic kidney disease: Secondary | ICD-10-CM | POA: Diagnosis not present

## 2015-11-14 DIAGNOSIS — N390 Urinary tract infection, site not specified: Secondary | ICD-10-CM | POA: Diagnosis not present

## 2015-11-15 ENCOUNTER — Emergency Department (HOSPITAL_COMMUNITY)
Admission: EM | Admit: 2015-11-15 | Discharge: 2015-11-15 | Disposition: A | Discharge status: Home / Self Care | Payer: Medicare Other | Attending: Emergency Medicine | Admitting: Emergency Medicine

## 2015-11-15 ENCOUNTER — Encounter (HOSPITAL_COMMUNITY): Payer: Self-pay | Admitting: Emergency Medicine

## 2015-11-15 ENCOUNTER — Emergency Department (HOSPITAL_COMMUNITY): Payer: Medicare Other

## 2015-11-15 DIAGNOSIS — Z87891 Personal history of nicotine dependence: Secondary | ICD-10-CM | POA: Diagnosis not present

## 2015-11-15 DIAGNOSIS — Z8673 Personal history of transient ischemic attack (TIA), and cerebral infarction without residual deficits: Secondary | ICD-10-CM | POA: Diagnosis not present

## 2015-11-15 DIAGNOSIS — M79651 Pain in right thigh: Secondary | ICD-10-CM | POA: Diagnosis not present

## 2015-11-15 DIAGNOSIS — S79911A Unspecified injury of right hip, initial encounter: Secondary | ICD-10-CM | POA: Diagnosis not present

## 2015-11-15 DIAGNOSIS — M79604 Pain in right leg: Secondary | ICD-10-CM | POA: Diagnosis not present

## 2015-11-15 DIAGNOSIS — H04123 Dry eye syndrome of bilateral lacrimal glands: Secondary | ICD-10-CM | POA: Diagnosis not present

## 2015-11-15 DIAGNOSIS — E119 Type 2 diabetes mellitus without complications: Secondary | ICD-10-CM | POA: Diagnosis not present

## 2015-11-15 DIAGNOSIS — N183 Chronic kidney disease, stage 3 (moderate): Secondary | ICD-10-CM | POA: Insufficient documentation

## 2015-11-15 DIAGNOSIS — M25551 Pain in right hip: Secondary | ICD-10-CM | POA: Diagnosis not present

## 2015-11-15 DIAGNOSIS — I129 Hypertensive chronic kidney disease with stage 1 through stage 4 chronic kidney disease, or unspecified chronic kidney disease: Secondary | ICD-10-CM | POA: Insufficient documentation

## 2015-11-15 DIAGNOSIS — Z7901 Long term (current) use of anticoagulants: Secondary | ICD-10-CM | POA: Insufficient documentation

## 2015-11-15 DIAGNOSIS — I10 Essential (primary) hypertension: Secondary | ICD-10-CM | POA: Diagnosis not present

## 2015-11-15 DIAGNOSIS — H25812 Combined forms of age-related cataract, left eye: Secondary | ICD-10-CM | POA: Diagnosis not present

## 2015-11-15 LAB — CBC WITH DIFFERENTIAL/PLATELET
BASOS ABS: 0 10*3/uL (ref 0.0–0.1)
BASOS PCT: 0 %
Eosinophils Absolute: 0.3 10*3/uL (ref 0.0–0.7)
Eosinophils Relative: 3 %
HCT: 32.1 % — ABNORMAL LOW (ref 39.0–52.0)
HEMOGLOBIN: 9.1 g/dL — AB (ref 13.0–17.0)
LYMPHS PCT: 16 %
Lymphs Abs: 1.5 10*3/uL (ref 0.7–4.0)
MCH: 21.8 pg — AB (ref 26.0–34.0)
MCHC: 28.3 g/dL — ABNORMAL LOW (ref 30.0–36.0)
MCV: 77 fL — AB (ref 78.0–100.0)
Monocytes Absolute: 0.7 10*3/uL (ref 0.1–1.0)
Monocytes Relative: 7 %
NEUTROS ABS: 6.8 10*3/uL (ref 1.7–7.7)
NEUTROS PCT: 74 %
PLATELETS: 227 10*3/uL (ref 150–400)
RBC: 4.17 MIL/uL — AB (ref 4.22–5.81)
RDW: 18.2 % — ABNORMAL HIGH (ref 11.5–15.5)
WBC: 9.4 10*3/uL (ref 4.0–10.5)

## 2015-11-15 LAB — BASIC METABOLIC PANEL
Anion gap: 9 (ref 5–15)
BUN: 15 mg/dL (ref 6–20)
CHLORIDE: 98 mmol/L — AB (ref 101–111)
CO2: 25 mmol/L (ref 22–32)
Calcium: 9 mg/dL (ref 8.9–10.3)
Creatinine, Ser: 1.51 mg/dL — ABNORMAL HIGH (ref 0.61–1.24)
GFR calc Af Amer: 53 mL/min — ABNORMAL LOW (ref >60)
GFR calc non Af Amer: 46 mL/min — ABNORMAL LOW (ref >60)
GLUCOSE: 100 mg/dL — AB (ref 65–99)
POTASSIUM: 3.8 mmol/L (ref 3.5–5.1)
Sodium: 132 mmol/L — ABNORMAL LOW (ref 135–145)

## 2015-11-15 LAB — PROTIME-INR
INR: 1.91 — ABNORMAL HIGH (ref 0.00–1.49)
Prothrombin Time: 21.8 seconds — ABNORMAL HIGH (ref 11.6–15.2)

## 2015-11-15 MED ORDER — HYDROCODONE-ACETAMINOPHEN 5-325 MG PO TABS: 2.0000 | ORAL_TABLET | Freq: Four times a day (QID) | ORAL | Status: DC | PRN

## 2015-11-15 MED ORDER — HYDROCODONE-ACETAMINOPHEN 5-325 MG PO TABS
2.0000 | ORAL_TABLET | Freq: Once | ORAL | Status: AC
  Administered 2015-11-15: 2 via ORAL
  Filled 2015-11-15: qty 2

## 2015-11-15 MED ORDER — HYDROCODONE-ACETAMINOPHEN 5-325 MG PO TABS: 1.0000 | ORAL_TABLET | Freq: Once | ORAL | Status: DC

## 2015-11-15 NOTE — ED Provider Notes (Signed)
Medical screening examination/treatment/procedure(s) were conducted as a shared visit with non-physician practitioner(s) and myself.  I personally evaluated the patient during the encounter.   EKG Interpretation None      Pt is a 70 y.o. male with history of hypertension, atrial fibrillation, chronic kidney disease who presents to the emergency department with elevated blood pressure. Reports he measured his blood pressure at home and it was very elevated. He did have increase blurry vision bilaterally but no vision loss. States that he has cataracts at baseline and his vision is always poor and it feels like it is back to baseline. No headache, chest pain, shortness of breath, numbness, tingling or focal weakness. His blood pressure has improved without intervention. He thinks that his blood pressure has been elevated because of his right leg pain. Has had pain in the right buttock, right lateral hip for several days. Did have a fall 3 weeks ago where he landed on the right hip. Has been ambulatory. X-ray shows no fracture or dislocation. He is neurologically intact on exam. Blood pressure is 137/90. Labs appear to be at their baseline. I feel he is safe for discharge home. We'll discharge with pain medication as this may be contributing to why his blood pressure was elevated at home. Suspect he may have some component of sciatica, per form a syndrome. Have recommend close outpatient PCP follow-up. He is comfortable with this plan. Do not feel he needs a head CT. Do not think he is having a stroke.  Layla MawKristen N Ward, DO 11/15/15 [phone]

## 2015-11-15 NOTE — ED Provider Notes (Signed)
CSN: [id number]     Arrival date & time 11/15/15  0012 History   First MD Initiated Contact with Patient 11/15/15 0214     Chief Complaint  Patient presents with  . Hypertension   HPI  Mr. Winch is a 70 year old male with past medical history of A. fib on Coumadin and chronic kidney disease presenting with hypertension. Patient states he took his blood pressure at home using an automatic wrist machine and it was 189/139. He denies symptoms including chest pain, shortness breath, palpitations, headache, vision changes, extremity weakness or extremity numbness. He notes that his home health nurse had visited him earlier in the day and had a slightly elevated blood pressure reading of 140/100. He states that his cardiologist was called who recommended home health nurse recheck his blood pressure tomorrow. Patient was recently hospitalized for rhabdo and acute kidney injury. He had his lisinopril stopped after discharge and has not restarted any new blood pressure medication since. Blood pressure on presentation to the emergency department was 137/90. Patient states his only complaint is right leg pain. He was seen in the emergency department 2 days ago for this pain. He had a negative workup and was discharged with Robaxin. Patient states he believes the Robaxin is causing his High blood pressure so he stopped taking it. Denies change in his leg pain. He also believes that the leg pain is exacerbating his blood pressure.  Past Medical History  Diagnosis Date  . Obesity, unspecified 03/28/2013  . Atrial fibrillation (HCC) 02/08/2013    CHADS-VAS - 2 (AGE, HTN). On anticoagulation. Holter 6/14 - 2.3 sec pause. Avg HR 87. Rate control   . Chronic anticoagulation 03/28/2013  . Hyperhidrosis 03/28/2013  . Dilated aortic root (HCC) 03/28/2013    6/14-4.5cm sinus of Valsalva, 4.2 cm sinotubular junction  . Bifascicular block 03/28/2013  . Gout   . Bilateral congenital hammer toes   . 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 Rhabdomyolysis 09/2015  . Chronic kidney disease, stage III (moderate) 03/28/2013   Past Surgical History  Procedure Laterality Date  . Hernia repair    . Kidney stone surgery    . Cataract extraction     Family History  Problem Relation Age of Onset  . Hypertension Mother   . Diabetes Mother    Social History  Substance Use Topics  . Smoking status: Former Smoker    Quit date: 03/28/1945  . Smokeless tobacco: Never Used  . Alcohol Use: No    Review of Systems  All other systems reviewed and are negative.     Allergies  Duloxetine and Sulfur  Home Medications   Prior to Admission medications   Medication Sig Start Date End Date Taking? Authorizing Provider  CHROMIUM-CINNAMON PO Take 200 mg by mouth at bedtime.    Yes Historical Provider, MD  cloNIDine (CATAPRES) 0.2 MG tablet Take 0.2 mg by mouth 3 (three) times daily.  03/08/13  Yes Historical Provider, MD  fluticasone (FLONASE) 50 MCG/ACT nasal spray Place 2 sprays into both nostrils daily.  09/28/14  Yes Historical Provider, MD  HYDROcodone-acetaminophen (NORCO/VICODIN) 5-325 MG tablet Take 1 tablet by mouth every 6 (six) hours as needed for moderate pain.  09/16/15  Yes Historical Provider, MD  loratadine (CLARITIN) 10 MG tablet Take 10 mg by mouth daily.   Yes Historical Provider, MD  methocarbamol (ROBAXIN) 500 MG tablet Take 1 tablet (500 mg total) by mouth 2 (two) times daily. 11/12/15  Yes Garlon HatchetLisa M Sanders, PA-C  metoprolol (LOPRESSOR) 100 MG tablet Take 200 mg by mouth 2 (two) times daily.  01/05/13  Yes Historical Provider, MD  Omega-3 Fatty Acids (OMEGA 3 PO) Take 1 capsule by mouth 2 (two) times daily.    Yes Historical Provider, MD  predniSONE (DELTASONE) 5 MG tablet Take 10 mg by mouth daily with breakfast.    Yes Historical Provider, MD  vitamin E 400 UNIT capsule Take 400 Units by mouth daily.   Yes Historical Provider, MD  warfarin (COUMADIN) 5 MG tablet TAKE AS DIRECTED BY  COUMADIN CLINIC Patient taking differently: TAKE 2.5 on Wednesdays.  5 mg all other days 07/29/15  Yes Jake BatheMark C Skains, MD  HYDROcodone-acetaminophen (NORCO/VICODIN) 5-325 MG tablet Take 2 tablets by mouth every 6 (six) hours as needed. 11/15/15   Shantia Sanford, PA-C   BP 129/95 mmHg  Pulse 84  Temp(Src) 98.3 F (36.8 C) (Oral)  Resp 15  SpO2 100% Physical Exam  Constitutional: He is oriented to person, place, and time. He appears well-developed and well-nourished. No distress.  Nontoxic appearing  HENT:  Head: Normocephalic and atraumatic.  Eyes: Conjunctivae are normal. Right eye exhibits no discharge. Left eye exhibits no discharge. No scleral icterus.  Neck: Normal range of motion.  Cardiovascular: Normal rate, normal heart sounds and intact distal pulses.  An irregularly irregular rhythm present.  Pulmonary/Chest: Effort normal and breath sounds normal. No respiratory distress.  Abdominal: Soft. He exhibits no distension. There is no tenderness.  Musculoskeletal: Normal range of motion.  Mild tenderness along the right hip and thigh. Full range motion at the hip intact. Patient is ambulatory.  Neurological: He is alert and oriented to person, place, and time. No cranial nerve deficit. Coordination normal.  Cranial nerves grossly intact. 5/5 strength in all major muscle groups. Sensation to light touch intact throughout.  Skin: Skin is warm and dry.  Psychiatric: He has a normal mood and affect. His behavior is normal.  Nursing note and vitals reviewed.   ED Course  Procedures (including critical care time) Labs Review Labs Reviewed  CBC WITH DIFFERENTIAL/PLATELET - Abnormal; Notable for the following:    RBC 4.17 (*)    Hemoglobin 9.1 (*)    HCT 32.1 (*)    MCV 77.0 (*)    MCH 21.8 (*)    MCHC 28.3 (*)    RDW 18.2 (*)    All other components within normal limits  BASIC METABOLIC PANEL - Abnormal; Notable for the following:    Sodium 132 (*)    Chloride 98 (*)    Glucose,  Bld 100 (*)    Creatinine, Ser 1.51 (*)    GFR calc non Af Amer 46 (*)    GFR calc Af Amer 53 (*)    All other components within normal limits  PROTIME-INR - Abnormal; Notable for the following:    Prothrombin Time 21.8 (*)    INR 1.91 (*)    All other components within normal limits    Imaging Review Dg Hip Unilat With Pelvis 2-3 Views Right  11/15/2015  CLINICAL DATA:  RIGHT hip pain radiating to the back, 3 weeks after fall. EXAM: DG HIP (WITH OR WITHOUT PELVIS) 2-3V RIGHT COMPARISON:  None. FINDINGS: There is no evidence of hip fracture or dislocation. There is no evidence of arthropathy or other focal bone abnormality. Phleboliths project in the pelvis. Calcification projecting LEFT mid abdomen suggests diverticulosis. Moderate vascular calcifications. IMPRESSION: Negative. Electronically Signed   By:  Awilda Metro M.D.   On: 11/15/2015 03:31   I have personally reviewed and evaluated these images and lab results as part of my medical decision-making.   EKG Interpretation None      MDM   Final diagnoses:  Essential hypertension  Right leg pain   70 year old male presenting with high blood pressure reading at home. Patient denies symptoms including headache, vision changes, chest pain, shortness of breath or weakness. Blood pressure improved without intervention upon presentation to emergency department. Only complaint is right leg pain. Negative workup 2 days ago in ER for same complaint. Nonfocal neuro exam. Right lower extremities neurovascularly intact with full range of motion. History of fall 3 weeks ago without imaging. Hip x-ray negative. INR 1.9. In controlled with Vicodin and emergency department. Patient has follow-up with PCP in the next weeks. Will discharge with short course of pain medications. Discussed reasons to return to emergency department. Patient stable for discharge.    Alveta Heimlich, PA-C 11/15/15 0448

## 2015-11-15 NOTE — ED Notes (Signed)
Pt was seen earlier this week for right leg pain. States that he stopped taking Robaxin because he feels like it was causing his BP to elevate. Also states that it was not helping anyway.

## 2015-11-15 NOTE — ED Notes (Signed)
Patient transported to X-ray 

## 2015-11-15 NOTE — Discharge Instructions (Signed)
Keep your scheduled follow up appointment with your PCP.    Hypertension Hypertension, commonly called high blood pressure, is when the force of blood pumping through your arteries is too strong. Your arteries are the blood vessels that carry blood from your heart throughout your body. A blood pressure reading consists of a higher number over a lower number, such as 110/72. The higher number (systolic) is the pressure inside your arteries when your heart pumps. The lower number (diastolic) is the pressure inside your arteries when your heart relaxes. Ideally you want your blood pressure below 120/80. Hypertension forces your heart to work harder to pump blood. Your arteries may become narrow or stiff. Having untreated or uncontrolled hypertension can cause heart attack, stroke, kidney disease, and other problems. RISK FACTORS Some risk factors for high blood pressure are controllable. Others are not.  Risk factors you cannot control include:   Race. You may be at higher risk if you are African American.  Age. Risk increases with age.  Gender. Men are at higher risk than women before age 68 years. After age 68, women are at higher risk than men. Risk factors you can control include:  Not getting enough exercise or physical activity.  Being overweight.  Getting too much fat, sugar, calories, or salt in your diet.  Drinking too much alcohol. SIGNS AND SYMPTOMS Hypertension does not usually cause signs or symptoms. Extremely high blood pressure (hypertensive crisis) may cause headache, anxiety, shortness of breath, and nosebleed. DIAGNOSIS To check if you have hypertension, your health care provider will measure your blood pressure while you are seated, with your arm held at the level of your heart. It should be measured at least twice using the same arm. Certain conditions can cause a difference in blood pressure between your right and left arms. A blood pressure reading that is higher than  normal on one occasion does not mean that you need treatment. If it is not clear whether you have high blood pressure, you may be asked to return on a different day to have your blood pressure checked again. Or, you may be asked to monitor your blood pressure at home for 1 or more weeks. TREATMENT Treating high blood pressure includes making lifestyle changes and possibly taking medicine. Living a healthy lifestyle can help lower high blood pressure. You may need to change some of your habits. Lifestyle changes may include:  Following the DASH diet. This diet is high in fruits, vegetables, and whole grains. It is low in salt, red meat, and added sugars.  Keep your sodium intake below 2,300 mg per day.  Getting at least 30-45 minutes of aerobic exercise at least 4 times per week.  Losing weight if necessary.  Not smoking.  Limiting alcoholic beverages.  Learning ways to reduce stress. Your health care provider may prescribe medicine if lifestyle changes are not enough to get your blood pressure under control, and if one of the following is true:  You are 3018-68 years of age and your systolic blood pressure is above 140.  You are 68 years of age or older, and your systolic blood pressure is above 150.  Your diastolic blood pressure is above 90.  You have diabetes, and your systolic blood pressure is over 140 or your diastolic blood pressure is over 90.  You have kidney disease and your blood pressure is above 140/90.  You have heart disease and your blood pressure is above 140/90. Your personal target blood pressure may vary depending  on your medical conditions, your age, and other factors. HOME CARE INSTRUCTIONS  Have your blood pressure rechecked as directed by your health care provider.   Take medicines only as directed by your health care provider. Follow the directions carefully. Blood pressure medicines must be taken as prescribed. The medicine does not work as well when you  skip doses. Skipping doses also puts you at risk for problems.  Do not smoke.   Monitor your blood pressure at home as directed by your health care provider. SEEK MEDICAL CARE IF:   You think you are having a reaction to medicines taken.  You have recurrent headaches or feel dizzy.  You have swelling in your ankles.  You have trouble with your vision. SEEK IMMEDIATE MEDICAL CARE IF:  You develop a severe headache or confusion.  You have unusual weakness, numbness, or feel faint.  You have severe chest or abdominal pain.  You vomit repeatedly.  You have trouble breathing. MAKE SURE YOU:   Understand these instructions.  Will watch your condition.  Will get help right away if you are not doing well or get worse.   This information is not intended to replace advice given to you by your health care provider. Make sure you discuss any questions you have with your health care provider.   Document Released: 04/13/2005 Document Revised: 08/28/2014 Document Reviewed: 02/03/2013 Elsevier Interactive Patient Education 2016 Elsevier Inc.  Musculoskeletal Pain Musculoskeletal pain is muscle and boney aches and pains. These pains can occur in any part of the body. Your caregiver may treat you without knowing the cause of the pain. They may treat you if blood or urine tests, X-rays, and other tests were normal.  CAUSES There is often not a definite cause or reason for these pains. These pains may be caused by a type of germ (virus). The discomfort may also come from overuse. Overuse includes working out too hard when your body is not fit. Boney aches also come from weather changes. Bone is sensitive to atmospheric pressure changes. HOME CARE INSTRUCTIONS   Ask when your test results will be ready. Make sure you get your test results.  Only take over-the-counter or prescription medicines for pain, discomfort, or fever as directed by your caregiver. If you were given medications for  your condition, do not drive, operate machinery or power tools, or sign legal documents for 24 hours. Do not drink alcohol. Do not take sleeping pills or other medications that may interfere with treatment.  Continue all activities unless the activities cause more pain. When the pain lessens, slowly resume normal activities. Gradually increase the intensity and duration of the activities or exercise.  During periods of severe pain, bed rest may be helpful. Lay or sit in any position that is comfortable.  Putting ice on the injured area.  Put ice in a bag.  Place a towel between your skin and the bag.  Leave the ice on for 15 to 20 minutes, 3 to 4 times a day.  Follow up with your caregiver for continued problems and no reason can be found for the pain. If the pain becomes worse or does not go away, it may be necessary to repeat tests or do additional testing. Your caregiver may need to look further for a possible cause. SEEK IMMEDIATE MEDICAL CARE IF:  You have pain that is getting worse and is not relieved by medications.  You develop chest pain that is associated with shortness or breath, sweating, feeling sick  to your stomach (nauseous), or throw up (vomit).  Your pain becomes localized to the abdomen.  You develop any new symptoms that seem different or that concern you. MAKE SURE YOU:   Understand these instructions.  Will watch your condition.  Will get help right away if you are not doing well or get worse.   This information is not intended to replace advice given to you by your health care provider. Make sure you discuss any questions you have with your health care provider.   Document Released: 04/13/2005 Document Revised: 07/06/2011 Document Reviewed: 12/16/2012 Elsevier Interactive Patient Education [employer]

## 2015-11-15 NOTE — ED Notes (Signed)
Pt. reports elevated blood pressure at home this evening 189/139 , denies pain or discomfort , respirations unlabored .

## 2015-11-18 DIAGNOSIS — B9620 Unspecified Escherichia coli [E. coli] as the cause of diseases classified elsewhere: Secondary | ICD-10-CM | POA: Diagnosis not present

## 2015-11-18 DIAGNOSIS — I129 Hypertensive chronic kidney disease with stage 1 through stage 4 chronic kidney disease, or unspecified chronic kidney disease: Secondary | ICD-10-CM | POA: Diagnosis not present

## 2015-11-18 DIAGNOSIS — N390 Urinary tract infection, site not specified: Secondary | ICD-10-CM | POA: Diagnosis not present

## 2015-11-18 DIAGNOSIS — N183 Chronic kidney disease, stage 3 (moderate): Secondary | ICD-10-CM | POA: Diagnosis not present

## 2015-11-18 DIAGNOSIS — I481 Persistent atrial fibrillation: Secondary | ICD-10-CM | POA: Diagnosis not present

## 2015-11-18 DIAGNOSIS — M109 Gout, unspecified: Secondary | ICD-10-CM | POA: Diagnosis not present

## 2015-11-19 DIAGNOSIS — M5441 Lumbago with sciatica, right side: Secondary | ICD-10-CM | POA: Diagnosis not present

## 2015-11-19 DIAGNOSIS — S336XXA Sprain of sacroiliac joint, initial encounter: Secondary | ICD-10-CM | POA: Diagnosis not present

## 2015-11-19 DIAGNOSIS — M9902 Segmental and somatic dysfunction of thoracic region: Secondary | ICD-10-CM | POA: Diagnosis not present

## 2015-11-19 DIAGNOSIS — S39012A Strain of muscle, fascia and tendon of lower back, initial encounter: Secondary | ICD-10-CM | POA: Diagnosis not present

## 2015-11-19 DIAGNOSIS — M47814 Spondylosis without myelopathy or radiculopathy, thoracic region: Secondary | ICD-10-CM | POA: Diagnosis not present

## 2015-11-19 DIAGNOSIS — M9907 Segmental and somatic dysfunction of upper extremity: Secondary | ICD-10-CM | POA: Diagnosis not present

## 2015-11-19 DIAGNOSIS — S46011A Strain of muscle(s) and tendon(s) of the rotator cuff of right shoulder, initial encounter: Secondary | ICD-10-CM | POA: Diagnosis not present

## 2015-11-19 DIAGNOSIS — M9905 Segmental and somatic dysfunction of pelvic region: Secondary | ICD-10-CM | POA: Diagnosis not present

## 2015-11-19 DIAGNOSIS — M5136 Other intervertebral disc degeneration, lumbar region: Secondary | ICD-10-CM | POA: Diagnosis not present

## 2015-11-19 DIAGNOSIS — M9903 Segmental and somatic dysfunction of lumbar region: Secondary | ICD-10-CM | POA: Diagnosis not present

## 2015-11-19 DIAGNOSIS — S29019A Strain of muscle and tendon of unspecified wall of thorax, initial encounter: Secondary | ICD-10-CM | POA: Diagnosis not present

## 2015-11-20 ENCOUNTER — Ambulatory Visit (INDEPENDENT_AMBULATORY_CARE_PROVIDER_SITE_OTHER): Payer: Medicare Other | Admitting: Cardiovascular Disease

## 2015-11-20 DIAGNOSIS — M9905 Segmental and somatic dysfunction of pelvic region: Secondary | ICD-10-CM | POA: Diagnosis not present

## 2015-11-20 DIAGNOSIS — I481 Persistent atrial fibrillation: Secondary | ICD-10-CM | POA: Diagnosis not present

## 2015-11-20 DIAGNOSIS — M47814 Spondylosis without myelopathy or radiculopathy, thoracic region: Secondary | ICD-10-CM | POA: Diagnosis not present

## 2015-11-20 DIAGNOSIS — M9903 Segmental and somatic dysfunction of lumbar region: Secondary | ICD-10-CM | POA: Diagnosis not present

## 2015-11-20 DIAGNOSIS — N390 Urinary tract infection, site not specified: Secondary | ICD-10-CM | POA: Diagnosis not present

## 2015-11-20 DIAGNOSIS — I129 Hypertensive chronic kidney disease with stage 1 through stage 4 chronic kidney disease, or unspecified chronic kidney disease: Secondary | ICD-10-CM | POA: Diagnosis not present

## 2015-11-20 DIAGNOSIS — M5441 Lumbago with sciatica, right side: Secondary | ICD-10-CM | POA: Diagnosis not present

## 2015-11-20 DIAGNOSIS — M5136 Other intervertebral disc degeneration, lumbar region: Secondary | ICD-10-CM | POA: Diagnosis not present

## 2015-11-20 DIAGNOSIS — N183 Chronic kidney disease, stage 3 (moderate): Secondary | ICD-10-CM | POA: Diagnosis not present

## 2015-11-20 DIAGNOSIS — B9620 Unspecified Escherichia coli [E. coli] as the cause of diseases classified elsewhere: Secondary | ICD-10-CM | POA: Diagnosis not present

## 2015-11-20 DIAGNOSIS — M109 Gout, unspecified: Secondary | ICD-10-CM | POA: Diagnosis not present

## 2015-11-20 DIAGNOSIS — S336XXA Sprain of sacroiliac joint, initial encounter: Secondary | ICD-10-CM | POA: Diagnosis not present

## 2015-11-20 DIAGNOSIS — I4819 Other persistent atrial fibrillation: Secondary | ICD-10-CM

## 2015-11-20 DIAGNOSIS — S39012A Strain of muscle, fascia and tendon of lower back, initial encounter: Secondary | ICD-10-CM | POA: Diagnosis not present

## 2015-11-20 DIAGNOSIS — S29019A Strain of muscle and tendon of unspecified wall of thorax, initial encounter: Secondary | ICD-10-CM | POA: Diagnosis not present

## 2015-11-20 DIAGNOSIS — M9902 Segmental and somatic dysfunction of thoracic region: Secondary | ICD-10-CM | POA: Diagnosis not present

## 2015-11-20 DIAGNOSIS — S46011A Strain of muscle(s) and tendon(s) of the rotator cuff of right shoulder, initial encounter: Secondary | ICD-10-CM | POA: Diagnosis not present

## 2015-11-20 DIAGNOSIS — M9907 Segmental and somatic dysfunction of upper extremity: Secondary | ICD-10-CM | POA: Diagnosis not present

## 2015-11-20 DIAGNOSIS — Z5181 Encounter for therapeutic drug level monitoring: Secondary | ICD-10-CM

## 2015-11-20 LAB — POCT INR: INR: 2.3

## 2015-11-21 DIAGNOSIS — S39012A Strain of muscle, fascia and tendon of lower back, initial encounter: Secondary | ICD-10-CM | POA: Diagnosis not present

## 2015-11-21 DIAGNOSIS — M9905 Segmental and somatic dysfunction of pelvic region: Secondary | ICD-10-CM | POA: Diagnosis not present

## 2015-11-21 DIAGNOSIS — S46011A Strain of muscle(s) and tendon(s) of the rotator cuff of right shoulder, initial encounter: Secondary | ICD-10-CM | POA: Diagnosis not present

## 2015-11-21 DIAGNOSIS — S336XXA Sprain of sacroiliac joint, initial encounter: Secondary | ICD-10-CM | POA: Diagnosis not present

## 2015-11-21 DIAGNOSIS — M9902 Segmental and somatic dysfunction of thoracic region: Secondary | ICD-10-CM | POA: Diagnosis not present

## 2015-11-21 DIAGNOSIS — M5441 Lumbago with sciatica, right side: Secondary | ICD-10-CM | POA: Diagnosis not present

## 2015-11-21 DIAGNOSIS — M9907 Segmental and somatic dysfunction of upper extremity: Secondary | ICD-10-CM | POA: Diagnosis not present

## 2015-11-21 DIAGNOSIS — M5136 Other intervertebral disc degeneration, lumbar region: Secondary | ICD-10-CM | POA: Diagnosis not present

## 2015-11-21 DIAGNOSIS — M9903 Segmental and somatic dysfunction of lumbar region: Secondary | ICD-10-CM | POA: Diagnosis not present

## 2015-11-21 DIAGNOSIS — S29019A Strain of muscle and tendon of unspecified wall of thorax, initial encounter: Secondary | ICD-10-CM | POA: Diagnosis not present

## 2015-11-21 DIAGNOSIS — M47814 Spondylosis without myelopathy or radiculopathy, thoracic region: Secondary | ICD-10-CM | POA: Diagnosis not present

## 2015-11-22 DIAGNOSIS — I481 Persistent atrial fibrillation: Secondary | ICD-10-CM | POA: Diagnosis not present

## 2015-11-22 DIAGNOSIS — B9620 Unspecified Escherichia coli [E. coli] as the cause of diseases classified elsewhere: Secondary | ICD-10-CM | POA: Diagnosis not present

## 2015-11-22 DIAGNOSIS — I129 Hypertensive chronic kidney disease with stage 1 through stage 4 chronic kidney disease, or unspecified chronic kidney disease: Secondary | ICD-10-CM | POA: Diagnosis not present

## 2015-11-22 DIAGNOSIS — M109 Gout, unspecified: Secondary | ICD-10-CM | POA: Diagnosis not present

## 2015-11-22 DIAGNOSIS — N183 Chronic kidney disease, stage 3 (moderate): Secondary | ICD-10-CM | POA: Diagnosis not present

## 2015-11-22 DIAGNOSIS — N390 Urinary tract infection, site not specified: Secondary | ICD-10-CM | POA: Diagnosis not present

## 2015-11-25 DIAGNOSIS — I480 Paroxysmal atrial fibrillation: Secondary | ICD-10-CM | POA: Diagnosis not present

## 2015-11-25 DIAGNOSIS — I10 Essential (primary) hypertension: Secondary | ICD-10-CM | POA: Diagnosis not present

## 2015-11-25 DIAGNOSIS — M109 Gout, unspecified: Secondary | ICD-10-CM | POA: Diagnosis not present

## 2015-11-25 DIAGNOSIS — N183 Chronic kidney disease, stage 3 (moderate): Secondary | ICD-10-CM | POA: Diagnosis not present

## 2015-11-25 DIAGNOSIS — G894 Chronic pain syndrome: Secondary | ICD-10-CM | POA: Diagnosis not present

## 2015-11-25 DIAGNOSIS — E119 Type 2 diabetes mellitus without complications: Secondary | ICD-10-CM | POA: Diagnosis not present

## 2015-11-26 DIAGNOSIS — M9903 Segmental and somatic dysfunction of lumbar region: Secondary | ICD-10-CM | POA: Diagnosis not present

## 2015-11-26 DIAGNOSIS — S29019A Strain of muscle and tendon of unspecified wall of thorax, initial encounter: Secondary | ICD-10-CM | POA: Diagnosis not present

## 2015-11-26 DIAGNOSIS — S336XXA Sprain of sacroiliac joint, initial encounter: Secondary | ICD-10-CM | POA: Diagnosis not present

## 2015-11-26 DIAGNOSIS — S46011A Strain of muscle(s) and tendon(s) of the rotator cuff of right shoulder, initial encounter: Secondary | ICD-10-CM | POA: Diagnosis not present

## 2015-11-26 DIAGNOSIS — M47814 Spondylosis without myelopathy or radiculopathy, thoracic region: Secondary | ICD-10-CM | POA: Diagnosis not present

## 2015-11-26 DIAGNOSIS — M5136 Other intervertebral disc degeneration, lumbar region: Secondary | ICD-10-CM | POA: Diagnosis not present

## 2015-11-26 DIAGNOSIS — M9902 Segmental and somatic dysfunction of thoracic region: Secondary | ICD-10-CM | POA: Diagnosis not present

## 2015-11-26 DIAGNOSIS — S39012A Strain of muscle, fascia and tendon of lower back, initial encounter: Secondary | ICD-10-CM | POA: Diagnosis not present

## 2015-11-26 DIAGNOSIS — M5441 Lumbago with sciatica, right side: Secondary | ICD-10-CM | POA: Diagnosis not present

## 2015-11-26 DIAGNOSIS — M9905 Segmental and somatic dysfunction of pelvic region: Secondary | ICD-10-CM | POA: Diagnosis not present

## 2015-11-26 DIAGNOSIS — M9907 Segmental and somatic dysfunction of upper extremity: Secondary | ICD-10-CM | POA: Diagnosis not present

## 2015-11-27 DIAGNOSIS — N390 Urinary tract infection, site not specified: Secondary | ICD-10-CM | POA: Diagnosis not present

## 2015-11-27 DIAGNOSIS — B9620 Unspecified Escherichia coli [E. coli] as the cause of diseases classified elsewhere: Secondary | ICD-10-CM | POA: Diagnosis not present

## 2015-11-27 DIAGNOSIS — S39012A Strain of muscle, fascia and tendon of lower back, initial encounter: Secondary | ICD-10-CM | POA: Diagnosis not present

## 2015-11-27 DIAGNOSIS — S336XXA Sprain of sacroiliac joint, initial encounter: Secondary | ICD-10-CM | POA: Diagnosis not present

## 2015-11-27 DIAGNOSIS — M9907 Segmental and somatic dysfunction of upper extremity: Secondary | ICD-10-CM | POA: Diagnosis not present

## 2015-11-27 DIAGNOSIS — M47814 Spondylosis without myelopathy or radiculopathy, thoracic region: Secondary | ICD-10-CM | POA: Diagnosis not present

## 2015-11-27 DIAGNOSIS — M5136 Other intervertebral disc degeneration, lumbar region: Secondary | ICD-10-CM | POA: Diagnosis not present

## 2015-11-27 DIAGNOSIS — M5441 Lumbago with sciatica, right side: Secondary | ICD-10-CM | POA: Diagnosis not present

## 2015-11-27 DIAGNOSIS — N183 Chronic kidney disease, stage 3 (moderate): Secondary | ICD-10-CM | POA: Diagnosis not present

## 2015-11-27 DIAGNOSIS — I129 Hypertensive chronic kidney disease with stage 1 through stage 4 chronic kidney disease, or unspecified chronic kidney disease: Secondary | ICD-10-CM | POA: Diagnosis not present

## 2015-11-27 DIAGNOSIS — I481 Persistent atrial fibrillation: Secondary | ICD-10-CM | POA: Diagnosis not present

## 2015-11-27 DIAGNOSIS — S29019A Strain of muscle and tendon of unspecified wall of thorax, initial encounter: Secondary | ICD-10-CM | POA: Diagnosis not present

## 2015-11-27 DIAGNOSIS — S46011A Strain of muscle(s) and tendon(s) of the rotator cuff of right shoulder, initial encounter: Secondary | ICD-10-CM | POA: Diagnosis not present

## 2015-11-27 DIAGNOSIS — M9902 Segmental and somatic dysfunction of thoracic region: Secondary | ICD-10-CM | POA: Diagnosis not present

## 2015-11-27 DIAGNOSIS — M9905 Segmental and somatic dysfunction of pelvic region: Secondary | ICD-10-CM | POA: Diagnosis not present

## 2015-11-27 DIAGNOSIS — M9903 Segmental and somatic dysfunction of lumbar region: Secondary | ICD-10-CM | POA: Diagnosis not present

## 2015-11-27 DIAGNOSIS — M109 Gout, unspecified: Secondary | ICD-10-CM | POA: Diagnosis not present

## 2015-11-28 DIAGNOSIS — M9902 Segmental and somatic dysfunction of thoracic region: Secondary | ICD-10-CM | POA: Diagnosis not present

## 2015-11-28 DIAGNOSIS — M9903 Segmental and somatic dysfunction of lumbar region: Secondary | ICD-10-CM | POA: Diagnosis not present

## 2015-11-28 DIAGNOSIS — S29019A Strain of muscle and tendon of unspecified wall of thorax, initial encounter: Secondary | ICD-10-CM | POA: Diagnosis not present

## 2015-11-28 DIAGNOSIS — S46011A Strain of muscle(s) and tendon(s) of the rotator cuff of right shoulder, initial encounter: Secondary | ICD-10-CM | POA: Diagnosis not present

## 2015-11-28 DIAGNOSIS — M47814 Spondylosis without myelopathy or radiculopathy, thoracic region: Secondary | ICD-10-CM | POA: Diagnosis not present

## 2015-11-28 DIAGNOSIS — N390 Urinary tract infection, site not specified: Secondary | ICD-10-CM | POA: Diagnosis not present

## 2015-11-28 DIAGNOSIS — I129 Hypertensive chronic kidney disease with stage 1 through stage 4 chronic kidney disease, or unspecified chronic kidney disease: Secondary | ICD-10-CM | POA: Diagnosis not present

## 2015-11-28 DIAGNOSIS — S39012A Strain of muscle, fascia and tendon of lower back, initial encounter: Secondary | ICD-10-CM | POA: Diagnosis not present

## 2015-11-28 DIAGNOSIS — M5136 Other intervertebral disc degeneration, lumbar region: Secondary | ICD-10-CM | POA: Diagnosis not present

## 2015-11-28 DIAGNOSIS — M9905 Segmental and somatic dysfunction of pelvic region: Secondary | ICD-10-CM | POA: Diagnosis not present

## 2015-11-28 DIAGNOSIS — I481 Persistent atrial fibrillation: Secondary | ICD-10-CM | POA: Diagnosis not present

## 2015-11-28 DIAGNOSIS — B9620 Unspecified Escherichia coli [E. coli] as the cause of diseases classified elsewhere: Secondary | ICD-10-CM | POA: Diagnosis not present

## 2015-11-28 DIAGNOSIS — S336XXA Sprain of sacroiliac joint, initial encounter: Secondary | ICD-10-CM | POA: Diagnosis not present

## 2015-11-28 DIAGNOSIS — M109 Gout, unspecified: Secondary | ICD-10-CM | POA: Diagnosis not present

## 2015-11-28 DIAGNOSIS — N183 Chronic kidney disease, stage 3 (moderate): Secondary | ICD-10-CM | POA: Diagnosis not present

## 2015-11-28 DIAGNOSIS — M5441 Lumbago with sciatica, right side: Secondary | ICD-10-CM | POA: Diagnosis not present

## 2015-11-28 DIAGNOSIS — M9907 Segmental and somatic dysfunction of upper extremity: Secondary | ICD-10-CM | POA: Diagnosis not present

## 2015-11-29 ENCOUNTER — Telehealth: Payer: Self-pay | Admitting: Cardiology

## 2015-11-29 NOTE — Telephone Encounter (Signed)
Spoke with Ralph Sullivan and she states that she could not remember if it was our office or not that she had sent the orders for OT to.   Advised Ralph Sullivan that I could not locate those orders and that typically PCP handles PT/OT.  Ralph Sullivan states that she will contact PCP and will call our office back if necessary.

## 2015-11-29 NOTE — Telephone Encounter (Signed)
New Message:   She wants to know if you received orders faxed on 11-05-15 regarding Occupational Therapy?

## 2015-12-02 DIAGNOSIS — H2512 Age-related nuclear cataract, left eye: Secondary | ICD-10-CM | POA: Diagnosis not present

## 2015-12-02 DIAGNOSIS — H25812 Combined forms of age-related cataract, left eye: Secondary | ICD-10-CM | POA: Diagnosis not present

## 2015-12-03 DIAGNOSIS — M47814 Spondylosis without myelopathy or radiculopathy, thoracic region: Secondary | ICD-10-CM | POA: Diagnosis not present

## 2015-12-03 DIAGNOSIS — S39012A Strain of muscle, fascia and tendon of lower back, initial encounter: Secondary | ICD-10-CM | POA: Diagnosis not present

## 2015-12-03 DIAGNOSIS — S46011A Strain of muscle(s) and tendon(s) of the rotator cuff of right shoulder, initial encounter: Secondary | ICD-10-CM | POA: Diagnosis not present

## 2015-12-03 DIAGNOSIS — B9620 Unspecified Escherichia coli [E. coli] as the cause of diseases classified elsewhere: Secondary | ICD-10-CM | POA: Diagnosis not present

## 2015-12-03 DIAGNOSIS — N183 Chronic kidney disease, stage 3 (moderate): Secondary | ICD-10-CM | POA: Diagnosis not present

## 2015-12-03 DIAGNOSIS — I129 Hypertensive chronic kidney disease with stage 1 through stage 4 chronic kidney disease, or unspecified chronic kidney disease: Secondary | ICD-10-CM | POA: Diagnosis not present

## 2015-12-03 DIAGNOSIS — S29019A Strain of muscle and tendon of unspecified wall of thorax, initial encounter: Secondary | ICD-10-CM | POA: Diagnosis not present

## 2015-12-03 DIAGNOSIS — M9902 Segmental and somatic dysfunction of thoracic region: Secondary | ICD-10-CM | POA: Diagnosis not present

## 2015-12-03 DIAGNOSIS — M9903 Segmental and somatic dysfunction of lumbar region: Secondary | ICD-10-CM | POA: Diagnosis not present

## 2015-12-03 DIAGNOSIS — S336XXA Sprain of sacroiliac joint, initial encounter: Secondary | ICD-10-CM | POA: Diagnosis not present

## 2015-12-03 DIAGNOSIS — M5441 Lumbago with sciatica, right side: Secondary | ICD-10-CM | POA: Diagnosis not present

## 2015-12-03 DIAGNOSIS — M109 Gout, unspecified: Secondary | ICD-10-CM | POA: Diagnosis not present

## 2015-12-03 DIAGNOSIS — M9907 Segmental and somatic dysfunction of upper extremity: Secondary | ICD-10-CM | POA: Diagnosis not present

## 2015-12-03 DIAGNOSIS — N390 Urinary tract infection, site not specified: Secondary | ICD-10-CM | POA: Diagnosis not present

## 2015-12-03 DIAGNOSIS — M9905 Segmental and somatic dysfunction of pelvic region: Secondary | ICD-10-CM | POA: Diagnosis not present

## 2015-12-03 DIAGNOSIS — I481 Persistent atrial fibrillation: Secondary | ICD-10-CM | POA: Diagnosis not present

## 2015-12-03 DIAGNOSIS — M5136 Other intervertebral disc degeneration, lumbar region: Secondary | ICD-10-CM | POA: Diagnosis not present

## 2015-12-04 ENCOUNTER — Ambulatory Visit (INDEPENDENT_AMBULATORY_CARE_PROVIDER_SITE_OTHER): Payer: Medicare Other | Admitting: Cardiovascular Disease

## 2015-12-04 DIAGNOSIS — M9907 Segmental and somatic dysfunction of upper extremity: Secondary | ICD-10-CM | POA: Diagnosis not present

## 2015-12-04 DIAGNOSIS — I481 Persistent atrial fibrillation: Secondary | ICD-10-CM

## 2015-12-04 DIAGNOSIS — S39012A Strain of muscle, fascia and tendon of lower back, initial encounter: Secondary | ICD-10-CM | POA: Diagnosis not present

## 2015-12-04 DIAGNOSIS — M9903 Segmental and somatic dysfunction of lumbar region: Secondary | ICD-10-CM | POA: Diagnosis not present

## 2015-12-04 DIAGNOSIS — B9620 Unspecified Escherichia coli [E. coli] as the cause of diseases classified elsewhere: Secondary | ICD-10-CM | POA: Diagnosis not present

## 2015-12-04 DIAGNOSIS — S336XXA Sprain of sacroiliac joint, initial encounter: Secondary | ICD-10-CM | POA: Diagnosis not present

## 2015-12-04 DIAGNOSIS — N183 Chronic kidney disease, stage 3 (moderate): Secondary | ICD-10-CM | POA: Diagnosis not present

## 2015-12-04 DIAGNOSIS — M109 Gout, unspecified: Secondary | ICD-10-CM | POA: Diagnosis not present

## 2015-12-04 DIAGNOSIS — M5441 Lumbago with sciatica, right side: Secondary | ICD-10-CM | POA: Diagnosis not present

## 2015-12-04 DIAGNOSIS — M5136 Other intervertebral disc degeneration, lumbar region: Secondary | ICD-10-CM | POA: Diagnosis not present

## 2015-12-04 DIAGNOSIS — Z5181 Encounter for therapeutic drug level monitoring: Secondary | ICD-10-CM

## 2015-12-04 DIAGNOSIS — N390 Urinary tract infection, site not specified: Secondary | ICD-10-CM | POA: Diagnosis not present

## 2015-12-04 DIAGNOSIS — S29019A Strain of muscle and tendon of unspecified wall of thorax, initial encounter: Secondary | ICD-10-CM | POA: Diagnosis not present

## 2015-12-04 DIAGNOSIS — I129 Hypertensive chronic kidney disease with stage 1 through stage 4 chronic kidney disease, or unspecified chronic kidney disease: Secondary | ICD-10-CM | POA: Diagnosis not present

## 2015-12-04 DIAGNOSIS — M47814 Spondylosis without myelopathy or radiculopathy, thoracic region: Secondary | ICD-10-CM | POA: Diagnosis not present

## 2015-12-04 DIAGNOSIS — S46011A Strain of muscle(s) and tendon(s) of the rotator cuff of right shoulder, initial encounter: Secondary | ICD-10-CM | POA: Diagnosis not present

## 2015-12-04 DIAGNOSIS — M9902 Segmental and somatic dysfunction of thoracic region: Secondary | ICD-10-CM | POA: Diagnosis not present

## 2015-12-04 DIAGNOSIS — M9905 Segmental and somatic dysfunction of pelvic region: Secondary | ICD-10-CM | POA: Diagnosis not present

## 2015-12-04 DIAGNOSIS — I4819 Other persistent atrial fibrillation: Secondary | ICD-10-CM

## 2015-12-04 LAB — POCT INR: INR: 1.7

## 2015-12-06 DIAGNOSIS — M9903 Segmental and somatic dysfunction of lumbar region: Secondary | ICD-10-CM | POA: Diagnosis not present

## 2015-12-06 DIAGNOSIS — S29019A Strain of muscle and tendon of unspecified wall of thorax, initial encounter: Secondary | ICD-10-CM | POA: Diagnosis not present

## 2015-12-06 DIAGNOSIS — M5136 Other intervertebral disc degeneration, lumbar region: Secondary | ICD-10-CM | POA: Diagnosis not present

## 2015-12-06 DIAGNOSIS — S46011A Strain of muscle(s) and tendon(s) of the rotator cuff of right shoulder, initial encounter: Secondary | ICD-10-CM | POA: Diagnosis not present

## 2015-12-06 DIAGNOSIS — M9902 Segmental and somatic dysfunction of thoracic region: Secondary | ICD-10-CM | POA: Diagnosis not present

## 2015-12-06 DIAGNOSIS — M47814 Spondylosis without myelopathy or radiculopathy, thoracic region: Secondary | ICD-10-CM | POA: Diagnosis not present

## 2015-12-06 DIAGNOSIS — S336XXA Sprain of sacroiliac joint, initial encounter: Secondary | ICD-10-CM | POA: Diagnosis not present

## 2015-12-06 DIAGNOSIS — S39012A Strain of muscle, fascia and tendon of lower back, initial encounter: Secondary | ICD-10-CM | POA: Diagnosis not present

## 2015-12-06 DIAGNOSIS — M5441 Lumbago with sciatica, right side: Secondary | ICD-10-CM | POA: Diagnosis not present

## 2015-12-06 DIAGNOSIS — M9907 Segmental and somatic dysfunction of upper extremity: Secondary | ICD-10-CM | POA: Diagnosis not present

## 2015-12-06 DIAGNOSIS — M9905 Segmental and somatic dysfunction of pelvic region: Secondary | ICD-10-CM | POA: Diagnosis not present

## 2015-12-09 DIAGNOSIS — I10 Essential (primary) hypertension: Secondary | ICD-10-CM | POA: Diagnosis not present

## 2015-12-09 DIAGNOSIS — N3000 Acute cystitis without hematuria: Secondary | ICD-10-CM | POA: Diagnosis not present

## 2015-12-09 DIAGNOSIS — E119 Type 2 diabetes mellitus without complications: Secondary | ICD-10-CM | POA: Diagnosis not present

## 2015-12-09 DIAGNOSIS — M5431 Sciatica, right side: Secondary | ICD-10-CM | POA: Diagnosis not present

## 2015-12-09 DIAGNOSIS — I129 Hypertensive chronic kidney disease with stage 1 through stage 4 chronic kidney disease, or unspecified chronic kidney disease: Secondary | ICD-10-CM | POA: Diagnosis not present

## 2015-12-11 DIAGNOSIS — N183 Chronic kidney disease, stage 3 (moderate): Secondary | ICD-10-CM | POA: Diagnosis not present

## 2015-12-11 DIAGNOSIS — I481 Persistent atrial fibrillation: Secondary | ICD-10-CM | POA: Diagnosis not present

## 2015-12-11 DIAGNOSIS — I129 Hypertensive chronic kidney disease with stage 1 through stage 4 chronic kidney disease, or unspecified chronic kidney disease: Secondary | ICD-10-CM | POA: Diagnosis not present

## 2015-12-11 DIAGNOSIS — B9620 Unspecified Escherichia coli [E. coli] as the cause of diseases classified elsewhere: Secondary | ICD-10-CM | POA: Diagnosis not present

## 2015-12-11 DIAGNOSIS — N390 Urinary tract infection, site not specified: Secondary | ICD-10-CM | POA: Diagnosis not present

## 2015-12-11 DIAGNOSIS — M109 Gout, unspecified: Secondary | ICD-10-CM | POA: Diagnosis not present

## 2015-12-12 DIAGNOSIS — H25811 Combined forms of age-related cataract, right eye: Secondary | ICD-10-CM | POA: Diagnosis not present

## 2015-12-12 DIAGNOSIS — H2511 Age-related nuclear cataract, right eye: Secondary | ICD-10-CM | POA: Diagnosis not present

## 2015-12-13 DIAGNOSIS — I129 Hypertensive chronic kidney disease with stage 1 through stage 4 chronic kidney disease, or unspecified chronic kidney disease: Secondary | ICD-10-CM | POA: Diagnosis not present

## 2015-12-13 DIAGNOSIS — I481 Persistent atrial fibrillation: Secondary | ICD-10-CM | POA: Diagnosis not present

## 2015-12-13 DIAGNOSIS — N183 Chronic kidney disease, stage 3 (moderate): Secondary | ICD-10-CM | POA: Diagnosis not present

## 2015-12-13 DIAGNOSIS — N390 Urinary tract infection, site not specified: Secondary | ICD-10-CM | POA: Diagnosis not present

## 2015-12-13 DIAGNOSIS — M109 Gout, unspecified: Secondary | ICD-10-CM | POA: Diagnosis not present

## 2015-12-13 DIAGNOSIS — B9620 Unspecified Escherichia coli [E. coli] as the cause of diseases classified elsewhere: Secondary | ICD-10-CM | POA: Diagnosis not present

## 2015-12-16 DIAGNOSIS — M109 Gout, unspecified: Secondary | ICD-10-CM | POA: Diagnosis not present

## 2015-12-16 DIAGNOSIS — B9620 Unspecified Escherichia coli [E. coli] as the cause of diseases classified elsewhere: Secondary | ICD-10-CM | POA: Diagnosis not present

## 2015-12-16 DIAGNOSIS — I481 Persistent atrial fibrillation: Secondary | ICD-10-CM | POA: Diagnosis not present

## 2015-12-16 DIAGNOSIS — N183 Chronic kidney disease, stage 3 (moderate): Secondary | ICD-10-CM | POA: Diagnosis not present

## 2015-12-16 DIAGNOSIS — N390 Urinary tract infection, site not specified: Secondary | ICD-10-CM | POA: Diagnosis not present

## 2015-12-16 DIAGNOSIS — I129 Hypertensive chronic kidney disease with stage 1 through stage 4 chronic kidney disease, or unspecified chronic kidney disease: Secondary | ICD-10-CM | POA: Diagnosis not present

## 2015-12-17 ENCOUNTER — Ambulatory Visit (INDEPENDENT_AMBULATORY_CARE_PROVIDER_SITE_OTHER): Payer: Medicare Other | Admitting: Interventional Cardiology

## 2015-12-17 DIAGNOSIS — I4819 Other persistent atrial fibrillation: Secondary | ICD-10-CM

## 2015-12-17 DIAGNOSIS — I129 Hypertensive chronic kidney disease with stage 1 through stage 4 chronic kidney disease, or unspecified chronic kidney disease: Secondary | ICD-10-CM | POA: Diagnosis not present

## 2015-12-17 DIAGNOSIS — I481 Persistent atrial fibrillation: Secondary | ICD-10-CM | POA: Diagnosis not present

## 2015-12-17 DIAGNOSIS — Z5181 Encounter for therapeutic drug level monitoring: Secondary | ICD-10-CM

## 2015-12-17 DIAGNOSIS — N390 Urinary tract infection, site not specified: Secondary | ICD-10-CM | POA: Diagnosis not present

## 2015-12-17 DIAGNOSIS — N183 Chronic kidney disease, stage 3 (moderate): Secondary | ICD-10-CM | POA: Diagnosis not present

## 2015-12-17 DIAGNOSIS — M109 Gout, unspecified: Secondary | ICD-10-CM | POA: Diagnosis not present

## 2015-12-17 DIAGNOSIS — B9620 Unspecified Escherichia coli [E. coli] as the cause of diseases classified elsewhere: Secondary | ICD-10-CM | POA: Diagnosis not present

## 2015-12-17 LAB — POCT INR: INR: 3.1

## 2015-12-19 DIAGNOSIS — M109 Gout, unspecified: Secondary | ICD-10-CM | POA: Diagnosis not present

## 2015-12-19 DIAGNOSIS — M6281 Muscle weakness (generalized): Secondary | ICD-10-CM | POA: Diagnosis not present

## 2015-12-19 DIAGNOSIS — I481 Persistent atrial fibrillation: Secondary | ICD-10-CM | POA: Diagnosis not present

## 2015-12-19 DIAGNOSIS — I129 Hypertensive chronic kidney disease with stage 1 through stage 4 chronic kidney disease, or unspecified chronic kidney disease: Secondary | ICD-10-CM | POA: Diagnosis not present

## 2015-12-19 DIAGNOSIS — M79661 Pain in right lower leg: Secondary | ICD-10-CM | POA: Diagnosis not present

## 2015-12-19 DIAGNOSIS — N183 Chronic kidney disease, stage 3 (moderate): Secondary | ICD-10-CM | POA: Diagnosis not present

## 2015-12-20 DIAGNOSIS — I481 Persistent atrial fibrillation: Secondary | ICD-10-CM | POA: Diagnosis not present

## 2015-12-20 DIAGNOSIS — M79661 Pain in right lower leg: Secondary | ICD-10-CM | POA: Diagnosis not present

## 2015-12-20 DIAGNOSIS — M6281 Muscle weakness (generalized): Secondary | ICD-10-CM | POA: Diagnosis not present

## 2015-12-20 DIAGNOSIS — N183 Chronic kidney disease, stage 3 (moderate): Secondary | ICD-10-CM | POA: Diagnosis not present

## 2015-12-20 DIAGNOSIS — I129 Hypertensive chronic kidney disease with stage 1 through stage 4 chronic kidney disease, or unspecified chronic kidney disease: Secondary | ICD-10-CM | POA: Diagnosis not present

## 2015-12-20 DIAGNOSIS — M109 Gout, unspecified: Secondary | ICD-10-CM | POA: Diagnosis not present

## 2015-12-31 ENCOUNTER — Ambulatory Visit (INDEPENDENT_AMBULATORY_CARE_PROVIDER_SITE_OTHER): Payer: Medicare Other | Admitting: *Deleted

## 2015-12-31 DIAGNOSIS — I4819 Other persistent atrial fibrillation: Secondary | ICD-10-CM

## 2015-12-31 DIAGNOSIS — Z5181 Encounter for therapeutic drug level monitoring: Secondary | ICD-10-CM | POA: Diagnosis not present

## 2015-12-31 DIAGNOSIS — I4891 Unspecified atrial fibrillation: Secondary | ICD-10-CM | POA: Diagnosis not present

## 2015-12-31 DIAGNOSIS — I481 Persistent atrial fibrillation: Secondary | ICD-10-CM

## 2015-12-31 LAB — POCT INR: INR: 2

## 2016-01-21 ENCOUNTER — Ambulatory Visit (INDEPENDENT_AMBULATORY_CARE_PROVIDER_SITE_OTHER): Payer: Medicare Other

## 2016-01-21 DIAGNOSIS — Z5181 Encounter for therapeutic drug level monitoring: Secondary | ICD-10-CM | POA: Diagnosis not present

## 2016-01-21 DIAGNOSIS — I4891 Unspecified atrial fibrillation: Secondary | ICD-10-CM | POA: Diagnosis not present

## 2016-01-21 DIAGNOSIS — I481 Persistent atrial fibrillation: Secondary | ICD-10-CM | POA: Diagnosis not present

## 2016-01-21 DIAGNOSIS — I4819 Other persistent atrial fibrillation: Secondary | ICD-10-CM

## 2016-01-21 LAB — POCT INR: INR: 2.6

## 2016-02-12 ENCOUNTER — Other Ambulatory Visit: Payer: Self-pay | Admitting: Cardiology

## 2016-02-18 ENCOUNTER — Ambulatory Visit (INDEPENDENT_AMBULATORY_CARE_PROVIDER_SITE_OTHER): Payer: Medicare Other | Admitting: *Deleted

## 2016-02-18 DIAGNOSIS — Z23 Encounter for immunization: Secondary | ICD-10-CM | POA: Diagnosis not present

## 2016-02-18 DIAGNOSIS — Z5181 Encounter for therapeutic drug level monitoring: Secondary | ICD-10-CM | POA: Diagnosis not present

## 2016-02-18 DIAGNOSIS — I4891 Unspecified atrial fibrillation: Secondary | ICD-10-CM | POA: Diagnosis not present

## 2016-02-18 DIAGNOSIS — I481 Persistent atrial fibrillation: Secondary | ICD-10-CM

## 2016-02-18 DIAGNOSIS — I4819 Other persistent atrial fibrillation: Secondary | ICD-10-CM

## 2016-02-18 LAB — POCT INR: INR: 1.9

## 2016-02-28 DIAGNOSIS — J0100 Acute maxillary sinusitis, unspecified: Secondary | ICD-10-CM | POA: Diagnosis not present

## 2016-03-05 DIAGNOSIS — N183 Chronic kidney disease, stage 3 (moderate): Secondary | ICD-10-CM | POA: Diagnosis not present

## 2016-03-05 DIAGNOSIS — E119 Type 2 diabetes mellitus without complications: Secondary | ICD-10-CM | POA: Diagnosis not present

## 2016-03-05 DIAGNOSIS — G894 Chronic pain syndrome: Secondary | ICD-10-CM | POA: Diagnosis not present

## 2016-03-05 DIAGNOSIS — M109 Gout, unspecified: Secondary | ICD-10-CM | POA: Diagnosis not present

## 2016-03-16 ENCOUNTER — Encounter: Payer: Self-pay | Admitting: Cardiology

## 2016-03-16 ENCOUNTER — Ambulatory Visit (INDEPENDENT_AMBULATORY_CARE_PROVIDER_SITE_OTHER): Payer: Medicare Other | Admitting: Cardiology

## 2016-03-16 ENCOUNTER — Ambulatory Visit (INDEPENDENT_AMBULATORY_CARE_PROVIDER_SITE_OTHER): Payer: Medicare Other | Admitting: Pharmacist

## 2016-03-16 VITALS — BP 116/76 | HR 86 | Ht 72.0 in | Wt 210.8 lb

## 2016-03-16 DIAGNOSIS — E6609 Other obesity due to excess calories: Secondary | ICD-10-CM

## 2016-03-16 DIAGNOSIS — I4891 Unspecified atrial fibrillation: Secondary | ICD-10-CM

## 2016-03-16 DIAGNOSIS — Z7901 Long term (current) use of anticoagulants: Secondary | ICD-10-CM

## 2016-03-16 DIAGNOSIS — I77810 Thoracic aortic ectasia: Secondary | ICD-10-CM | POA: Diagnosis not present

## 2016-03-16 DIAGNOSIS — I4819 Other persistent atrial fibrillation: Secondary | ICD-10-CM

## 2016-03-16 DIAGNOSIS — Z5181 Encounter for therapeutic drug level monitoring: Secondary | ICD-10-CM

## 2016-03-16 DIAGNOSIS — I481 Persistent atrial fibrillation: Secondary | ICD-10-CM

## 2016-03-16 DIAGNOSIS — I4821 Permanent atrial fibrillation: Secondary | ICD-10-CM

## 2016-03-16 DIAGNOSIS — I482 Chronic atrial fibrillation: Secondary | ICD-10-CM

## 2016-03-16 LAB — POCT INR: INR: 1.6

## 2016-03-16 NOTE — Progress Notes (Signed)
[address] [place], [place]  [address] Phone: [phone] Fax:  [phone]  Date:  03/16/2016   ID:  Ralph Sullivan, DOB 02/02/1946, MRN [id number]  PCP:  Lorenda Ishiharaupashree Varadarajan, MD   History of Present Illness: Ralph ScarceSammy K Ciavarella is a 70 y.o. male (husband of Dustin FolksMary Dinan) who presented to me in June of 2014 with return of atrial fibrillation. Heart rate 79 on EKG with right bundle branch block, left anterior fascicular block, bifascicular block.  In 2009, he converted with amiodarone. Amiodarone was used because of moderate LVH seen on prior echocardiogram, normal EF. In 2011 went back in afib (off of amiodarone). He believes he has been in atrial fibrillation over the past 3 years. We have been maintaining rate control. He has not had any strokelike symptoms. He does battled with gout occasionally. No alcohol intake. No recent fevers. HR was slowed in NSR.  For the last few years however he has been in atrial fibrillation with good rate control.   An echocardiogram 6/14 demonstrated aortic root size 4.5 cm with mild LVH, normal EF. On repeat 6/15 and in 2017 - 4.2cm  Spirometry was reassuring. Has lost weight.   SOB weakness in legs, ABIs were normal. Obviously still grieving the loss of his wife Corrie DandyMary. He has been battling gout as well. Periodic use of prednisone. Mild left ankle swelling   Wt Readings from Last 3 Encounters:  03/16/16 210 lb 12.8 oz (95.6 kg)  11/12/15 200 lb (90.7 kg)  10/19/15 211 lb 9.6 oz (96 kg)     Past Medical History:  Diagnosis Date  . Atrial fibrillation (HCC) 02/08/2013   CHADS-VAS - 2 (AGE, HTN). On anticoagulation. Holter 6/14 - 2.3 sec pause. Avg HR 87. Rate control   . Bifascicular block 03/28/2013  . Bilateral congenital hammer toes   . Chronic anticoagulation 03/28/2013  . Chronic kidney disease, stage III (moderate) 03/28/2013  . 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  Gout   . Hyperhidrosis 03/28/2013  . Obesity, unspecified 03/28/2013  . Rhabdomyolysis 09/2015  . 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 CATARACT EXTRACTION    . HERNIA REPAIR    . KIDNEY STONE SURGERY      Current Outpatient Prescriptions  Medication Sig Dispense Refill  . amLODipine (NORVASC) 10 MG tablet Take 10 mg by mouth daily.    . CHROMIUM-CINNAMON PO Take 200 mg by mouth at bedtime.     . cloNIDine (CATAPRES) 0.2 MG tablet Take 0.2 mg by mouth 2 (two) times daily.     . fluticasone (FLONASE) 50 MCG/ACT nasal spray Place 2 sprays into both nostrils daily.     Marland Kitchen. HYDROcodone-acetaminophen (NORCO/VICODIN) 5-325 MG tablet Take 2 tablets by mouth every 6 (six) hours as needed. 15 tablet 0  . loratadine (CLARITIN) 10 MG tablet Take 10 mg by mouth daily.    . metoprolol (LOPRESSOR) 100 MG tablet Take 200 mg by mouth 2 (two) times daily.     . Omega-3 Fatty Acids (OMEGA 3 PO) Take 1 capsule by mouth 2 (two) times daily.     . predniSONE (DELTASONE) 5 MG tablet Take 10 mg by mouth. Use as directed per provider    . vitamin E 400 UNIT capsule Take 400 Units by mouth daily.    Marland Kitchen. warfarin (  COUMADIN) 5 MG tablet TAKE AS DIRECTED BY  COUMADIN  CLINIC. 40 tablet 3   No current facility-administered medications for this visit.     Allergies:    Allergies  Allergen Reactions  . Duloxetine Other (See Comments)    More nervous  . Sulfur Nausea And Vomiting    Social History:  The patient  reports that he quit smoking about 71 years ago. He has never used smokeless tobacco. He reports that he does not drink alcohol or use drugs.   ROS:  Please see the history of present illness.   Denies any syncope, bleeding, orthopnea, PND   PHYSICAL EXAM: VS:  BP 116/76   Pulse 86   Ht 6' (1.829 m)   Wt 210 lb 12.8 oz (95.6 kg)   BMI 28.59 kg/m  Well nourished, well developed, in no acute distress  HEENT: normal  Neck: no  JVD  Cardiac:  normal S1, S2; irreg irreg; no murmur  Lungs:  clear to auscultation bilaterally, no wheezing, rhonchi or rales  Abd: soft, nontender, no hepatomegaly Overweight Ext: no edema  Skin: warm and dry  palpable distal pulses  Neuro: no focal abnormalities noted  EKG:   today 05/02/15-atrial fibrillation heart rate 88 bpm, right bundle branch block, left anterior fascicular block, bifascicular block. 04/10/14-right bundle branch block, atrial fibrillation, 79, left anterior fascicular block, bifascicular block-no change from prior Atrial fibrillation heart rate 69, right bundle branch block, left anterior specific block, bifascicular block     ABIs 05/10/15-normal  ECHO 05/10/15: - Left ventricle: The cavity size was normal. There was mild  hypertrophy of the posterior wall and moderate hypertrophy of the  septal wall. Systolic function was at the lower limits of normal.  The estimated ejection fraction was in the range of 50% to 55%. - Left atrium: The atrium was severely dilated. - Pulmonary arteries: The main pulmonary artery was normal-sized.  Systolic pressure was within the normal range, estimated to be 28  mm Hg. - Aortic root-42 mm-stable - Inferior vena cava: The vessel was normal in size. The  respirophasic diameter changes were in the normal range (= 50%),  consistent with normal central venous pressure.  ASSESSMENT AND PLAN:  1. Atrial fibrillation permanent monitor 6/14 demonstrated an average heart rate of 87 beats per minute, longest pause of 2.3 seconds. Overall good rate control. No changes made. Metoprolol 100 , 2 in the morning, 2 in the evening 400 mg total . Seems to be stable.  2. Claudication/leg heaviness-1 walking noticing this bilaterally. ABIs were excellent. Reassuring on 05/10/15   3. Dyspnea/hypotension with exercise-overall reassuring nuclear stress test 04/19/2014. Most recent echo cardiac rhythm also reassuring. Continue to work on  conditioning.  4. Dilated aortic root - 42mm stable. Echocardiogram reviewed, last echo 10/13/13-we will repeat.  5. Chronic kidney disease stage III- Dr. Hyman HopesWebb. Avoid NSAIDs 6. Chronic anticoagulation-we are monitoring her in Coumadin clinic. No bleeding. No side effects. No strokelike symptoms.  7. Obesity-encouraging weight loss.  Continue. 8. Hypertension- very well controlled currently. No changes made.  9. Hyperhidrosis/sweating-TSH in the past has been normal. Continue to discuss with PCP was not an issue discussed today. 10. 6027-month follow-up  Signed, Donato SchultzMark Skains, MD [facility]  03/16/2016 8:52 AM

## 2016-03-16 NOTE — Patient Instructions (Signed)
Medication Instructions:  The current medical regimen is effective;  continue present plan and medications.  Follow-Up: Follow up in 6 months with Dr. Skains.  You will receive a letter in the mail 2 months before you are due.  Please call us when you receive this letter to schedule your follow up appointment.  Thank you for choosing [facility]!!     

## 2016-03-20 DIAGNOSIS — R2242 Localized swelling, mass and lump, left lower limb: Secondary | ICD-10-CM | POA: Diagnosis not present

## 2016-03-30 ENCOUNTER — Ambulatory Visit (INDEPENDENT_AMBULATORY_CARE_PROVIDER_SITE_OTHER): Payer: Medicare Other

## 2016-03-30 DIAGNOSIS — I481 Persistent atrial fibrillation: Secondary | ICD-10-CM

## 2016-03-30 DIAGNOSIS — I4819 Other persistent atrial fibrillation: Secondary | ICD-10-CM

## 2016-03-30 DIAGNOSIS — Z5181 Encounter for therapeutic drug level monitoring: Secondary | ICD-10-CM | POA: Diagnosis not present

## 2016-03-30 DIAGNOSIS — I4891 Unspecified atrial fibrillation: Secondary | ICD-10-CM

## 2016-03-30 LAB — POCT INR: INR: 2.3

## 2016-04-07 DIAGNOSIS — R8290 Unspecified abnormal findings in urine: Secondary | ICD-10-CM | POA: Diagnosis not present

## 2016-04-07 DIAGNOSIS — M109 Gout, unspecified: Secondary | ICD-10-CM | POA: Diagnosis not present

## 2016-04-07 DIAGNOSIS — E119 Type 2 diabetes mellitus without complications: Secondary | ICD-10-CM | POA: Diagnosis not present

## 2016-04-07 DIAGNOSIS — F329 Major depressive disorder, single episode, unspecified: Secondary | ICD-10-CM | POA: Diagnosis not present

## 2016-04-07 DIAGNOSIS — D649 Anemia, unspecified: Secondary | ICD-10-CM | POA: Diagnosis not present

## 2016-04-07 DIAGNOSIS — N390 Urinary tract infection, site not specified: Secondary | ICD-10-CM | POA: Diagnosis not present

## 2016-04-07 DIAGNOSIS — I10 Essential (primary) hypertension: Secondary | ICD-10-CM | POA: Diagnosis not present

## 2016-04-07 DIAGNOSIS — Z1211 Encounter for screening for malignant neoplasm of colon: Secondary | ICD-10-CM | POA: Diagnosis not present

## 2016-04-07 DIAGNOSIS — M5431 Sciatica, right side: Secondary | ICD-10-CM | POA: Diagnosis not present

## 2016-04-07 DIAGNOSIS — Z125 Encounter for screening for malignant neoplasm of prostate: Secondary | ICD-10-CM | POA: Diagnosis not present

## 2016-04-07 DIAGNOSIS — N183 Chronic kidney disease, stage 3 (moderate): Secondary | ICD-10-CM | POA: Diagnosis not present

## 2016-04-07 DIAGNOSIS — I6930 Unspecified sequelae of cerebral infarction: Secondary | ICD-10-CM | POA: Diagnosis not present

## 2016-04-07 DIAGNOSIS — F411 Generalized anxiety disorder: Secondary | ICD-10-CM | POA: Diagnosis not present

## 2016-04-07 DIAGNOSIS — D72825 Bandemia: Secondary | ICD-10-CM | POA: Diagnosis not present

## 2016-04-07 DIAGNOSIS — Z0000 Encounter for general adult medical examination without abnormal findings: Secondary | ICD-10-CM | POA: Diagnosis not present

## 2016-04-07 DIAGNOSIS — G894 Chronic pain syndrome: Secondary | ICD-10-CM | POA: Diagnosis not present

## 2016-04-08 DIAGNOSIS — M1712 Unilateral primary osteoarthritis, left knee: Secondary | ICD-10-CM | POA: Diagnosis not present

## 2016-04-23 ENCOUNTER — Ambulatory Visit (INDEPENDENT_AMBULATORY_CARE_PROVIDER_SITE_OTHER): Payer: Medicare Other

## 2016-04-23 DIAGNOSIS — I4891 Unspecified atrial fibrillation: Secondary | ICD-10-CM | POA: Diagnosis not present

## 2016-04-23 DIAGNOSIS — I4819 Other persistent atrial fibrillation: Secondary | ICD-10-CM

## 2016-04-23 DIAGNOSIS — I481 Persistent atrial fibrillation: Secondary | ICD-10-CM

## 2016-04-23 DIAGNOSIS — N3000 Acute cystitis without hematuria: Secondary | ICD-10-CM | POA: Diagnosis not present

## 2016-04-23 DIAGNOSIS — Z5181 Encounter for therapeutic drug level monitoring: Secondary | ICD-10-CM | POA: Diagnosis not present

## 2016-04-23 LAB — POCT INR: INR: 1.1

## 2016-04-24 DIAGNOSIS — Z8739 Personal history of other diseases of the musculoskeletal system and connective tissue: Secondary | ICD-10-CM | POA: Diagnosis not present

## 2016-04-24 DIAGNOSIS — M25472 Effusion, left ankle: Secondary | ICD-10-CM | POA: Diagnosis not present

## 2016-04-24 DIAGNOSIS — M25471 Effusion, right ankle: Secondary | ICD-10-CM | POA: Diagnosis not present

## 2016-04-24 DIAGNOSIS — M170 Bilateral primary osteoarthritis of knee: Secondary | ICD-10-CM | POA: Diagnosis not present

## 2016-04-28 ENCOUNTER — Ambulatory Visit (INDEPENDENT_AMBULATORY_CARE_PROVIDER_SITE_OTHER): Payer: Medicare Other

## 2016-04-28 DIAGNOSIS — Z5181 Encounter for therapeutic drug level monitoring: Secondary | ICD-10-CM

## 2016-04-28 DIAGNOSIS — I4819 Other persistent atrial fibrillation: Secondary | ICD-10-CM

## 2016-04-28 DIAGNOSIS — I481 Persistent atrial fibrillation: Secondary | ICD-10-CM | POA: Diagnosis not present

## 2016-04-28 DIAGNOSIS — I4891 Unspecified atrial fibrillation: Secondary | ICD-10-CM | POA: Diagnosis not present

## 2016-04-28 LAB — POCT INR: INR: 1.8

## 2016-05-12 ENCOUNTER — Ambulatory Visit (INDEPENDENT_AMBULATORY_CARE_PROVIDER_SITE_OTHER): Payer: Medicare Other | Admitting: *Deleted

## 2016-05-12 DIAGNOSIS — I4819 Other persistent atrial fibrillation: Secondary | ICD-10-CM

## 2016-05-12 DIAGNOSIS — I481 Persistent atrial fibrillation: Secondary | ICD-10-CM

## 2016-05-12 DIAGNOSIS — Z5181 Encounter for therapeutic drug level monitoring: Secondary | ICD-10-CM | POA: Diagnosis not present

## 2016-05-12 DIAGNOSIS — I4891 Unspecified atrial fibrillation: Secondary | ICD-10-CM

## 2016-05-12 DIAGNOSIS — R8290 Unspecified abnormal findings in urine: Secondary | ICD-10-CM | POA: Diagnosis not present

## 2016-05-12 DIAGNOSIS — D72829 Elevated white blood cell count, unspecified: Secondary | ICD-10-CM | POA: Diagnosis not present

## 2016-05-12 DIAGNOSIS — F418 Other specified anxiety disorders: Secondary | ICD-10-CM | POA: Diagnosis not present

## 2016-05-12 LAB — POCT INR: INR: 4.3

## 2016-05-18 ENCOUNTER — Telehealth: Payer: Self-pay | Admitting: *Deleted

## 2016-05-18 NOTE — Telephone Encounter (Signed)
Pt calls to inform us that he started Nitrofurantoin BID for 14 days. Informed pt there should not be any interaction with Coumadin but call back if he starts any other new medications.

## 2016-05-21 DIAGNOSIS — I272 Pulmonary hypertension, unspecified: Secondary | ICD-10-CM | POA: Diagnosis not present

## 2016-05-21 DIAGNOSIS — I129 Hypertensive chronic kidney disease with stage 1 through stage 4 chronic kidney disease, or unspecified chronic kidney disease: Secondary | ICD-10-CM | POA: Diagnosis present

## 2016-05-21 DIAGNOSIS — Z7901 Long term (current) use of anticoagulants: Secondary | ICD-10-CM | POA: Diagnosis not present

## 2016-05-21 DIAGNOSIS — R269 Unspecified abnormalities of gait and mobility: Secondary | ICD-10-CM | POA: Diagnosis present

## 2016-05-21 DIAGNOSIS — R079 Chest pain, unspecified: Secondary | ICD-10-CM | POA: Diagnosis not present

## 2016-05-21 DIAGNOSIS — S299XXA Unspecified injury of thorax, initial encounter: Secondary | ICD-10-CM | POA: Diagnosis not present

## 2016-05-21 DIAGNOSIS — M25552 Pain in left hip: Secondary | ICD-10-CM | POA: Diagnosis not present

## 2016-05-21 DIAGNOSIS — J3089 Other allergic rhinitis: Secondary | ICD-10-CM | POA: Diagnosis not present

## 2016-05-21 DIAGNOSIS — I119 Hypertensive heart disease without heart failure: Secondary | ICD-10-CM | POA: Diagnosis not present

## 2016-05-21 DIAGNOSIS — M1712 Unilateral primary osteoarthritis, left knee: Secondary | ICD-10-CM | POA: Diagnosis not present

## 2016-05-21 DIAGNOSIS — R259 Unspecified abnormal involuntary movements: Secondary | ICD-10-CM | POA: Diagnosis not present

## 2016-05-21 DIAGNOSIS — Z01810 Encounter for preprocedural cardiovascular examination: Secondary | ICD-10-CM | POA: Diagnosis not present

## 2016-05-21 DIAGNOSIS — M25559 Pain in unspecified hip: Secondary | ICD-10-CM | POA: Diagnosis not present

## 2016-05-21 DIAGNOSIS — S8992XA Unspecified injury of left lower leg, initial encounter: Secondary | ICD-10-CM | POA: Diagnosis not present

## 2016-05-21 DIAGNOSIS — Z7401 Bed confinement status: Secondary | ICD-10-CM | POA: Diagnosis not present

## 2016-05-21 DIAGNOSIS — S72145A Nondisplaced intertrochanteric fracture of left femur, initial encounter for closed fracture: Secondary | ICD-10-CM | POA: Diagnosis not present

## 2016-05-21 DIAGNOSIS — I10 Essential (primary) hypertension: Secondary | ICD-10-CM | POA: Diagnosis not present

## 2016-05-21 DIAGNOSIS — I517 Cardiomegaly: Secondary | ICD-10-CM | POA: Diagnosis not present

## 2016-05-21 DIAGNOSIS — I480 Paroxysmal atrial fibrillation: Secondary | ICD-10-CM | POA: Diagnosis not present

## 2016-05-21 DIAGNOSIS — M109 Gout, unspecified: Secondary | ICD-10-CM | POA: Diagnosis not present

## 2016-05-21 DIAGNOSIS — Z79899 Other long term (current) drug therapy: Secondary | ICD-10-CM | POA: Diagnosis not present

## 2016-05-21 DIAGNOSIS — F329 Major depressive disorder, single episode, unspecified: Secondary | ICD-10-CM | POA: Diagnosis not present

## 2016-05-21 DIAGNOSIS — G629 Polyneuropathy, unspecified: Secondary | ICD-10-CM | POA: Diagnosis not present

## 2016-05-21 DIAGNOSIS — I482 Chronic atrial fibrillation: Secondary | ICD-10-CM | POA: Diagnosis not present

## 2016-05-21 DIAGNOSIS — I361 Nonrheumatic tricuspid (valve) insufficiency: Secondary | ICD-10-CM | POA: Diagnosis not present

## 2016-05-21 DIAGNOSIS — S72142A Displaced intertrochanteric fracture of left femur, initial encounter for closed fracture: Secondary | ICD-10-CM | POA: Diagnosis not present

## 2016-05-21 DIAGNOSIS — I4891 Unspecified atrial fibrillation: Secondary | ICD-10-CM | POA: Diagnosis not present

## 2016-05-21 DIAGNOSIS — S72142D Displaced intertrochanteric fracture of left femur, subsequent encounter for closed fracture with routine healing: Secondary | ICD-10-CM | POA: Diagnosis not present

## 2016-05-21 DIAGNOSIS — I351 Nonrheumatic aortic (valve) insufficiency: Secondary | ICD-10-CM | POA: Diagnosis not present

## 2016-05-21 DIAGNOSIS — Z87891 Personal history of nicotine dependence: Secondary | ICD-10-CM | POA: Diagnosis not present

## 2016-05-21 DIAGNOSIS — M25562 Pain in left knee: Secondary | ICD-10-CM | POA: Diagnosis not present

## 2016-05-21 DIAGNOSIS — I5189 Other ill-defined heart diseases: Secondary | ICD-10-CM | POA: Diagnosis present

## 2016-05-21 DIAGNOSIS — R279 Unspecified lack of coordination: Secondary | ICD-10-CM | POA: Diagnosis not present

## 2016-05-21 DIAGNOSIS — N183 Chronic kidney disease, stage 3 (moderate): Secondary | ICD-10-CM | POA: Diagnosis not present

## 2016-05-25 DIAGNOSIS — I119 Hypertensive heart disease without heart failure: Secondary | ICD-10-CM | POA: Diagnosis not present

## 2016-05-25 DIAGNOSIS — F329 Major depressive disorder, single episode, unspecified: Secondary | ICD-10-CM | POA: Diagnosis not present

## 2016-05-25 DIAGNOSIS — I4891 Unspecified atrial fibrillation: Secondary | ICD-10-CM | POA: Diagnosis not present

## 2016-05-25 DIAGNOSIS — R079 Chest pain, unspecified: Secondary | ICD-10-CM | POA: Diagnosis not present

## 2016-05-25 DIAGNOSIS — I10 Essential (primary) hypertension: Secondary | ICD-10-CM | POA: Diagnosis not present

## 2016-05-25 DIAGNOSIS — Z7401 Bed confinement status: Secondary | ICD-10-CM | POA: Diagnosis not present

## 2016-05-25 DIAGNOSIS — K591 Functional diarrhea: Secondary | ICD-10-CM | POA: Diagnosis not present

## 2016-05-25 DIAGNOSIS — G8918 Other acute postprocedural pain: Secondary | ICD-10-CM | POA: Diagnosis not present

## 2016-05-25 DIAGNOSIS — M109 Gout, unspecified: Secondary | ICD-10-CM | POA: Diagnosis not present

## 2016-05-25 DIAGNOSIS — J3089 Other allergic rhinitis: Secondary | ICD-10-CM | POA: Diagnosis not present

## 2016-05-25 DIAGNOSIS — R279 Unspecified lack of coordination: Secondary | ICD-10-CM | POA: Diagnosis not present

## 2016-05-25 DIAGNOSIS — M1712 Unilateral primary osteoarthritis, left knee: Secondary | ICD-10-CM | POA: Diagnosis not present

## 2016-05-25 DIAGNOSIS — S72142D Displaced intertrochanteric fracture of left femur, subsequent encounter for closed fracture with routine healing: Secondary | ICD-10-CM | POA: Diagnosis not present

## 2016-05-25 DIAGNOSIS — Z7901 Long term (current) use of anticoagulants: Secondary | ICD-10-CM | POA: Diagnosis not present

## 2016-05-25 DIAGNOSIS — R262 Difficulty in walking, not elsewhere classified: Secondary | ICD-10-CM | POA: Diagnosis not present

## 2016-05-25 DIAGNOSIS — S72142A Displaced intertrochanteric fracture of left femur, initial encounter for closed fracture: Secondary | ICD-10-CM | POA: Diagnosis not present

## 2016-05-25 DIAGNOSIS — J101 Influenza due to other identified influenza virus with other respiratory manifestations: Secondary | ICD-10-CM | POA: Diagnosis not present

## 2016-05-25 DIAGNOSIS — D649 Anemia, unspecified: Secondary | ICD-10-CM | POA: Diagnosis not present

## 2016-05-26 DIAGNOSIS — S72142D Displaced intertrochanteric fracture of left femur, subsequent encounter for closed fracture with routine healing: Secondary | ICD-10-CM | POA: Diagnosis not present

## 2016-05-26 DIAGNOSIS — R262 Difficulty in walking, not elsewhere classified: Secondary | ICD-10-CM | POA: Diagnosis not present

## 2016-05-26 DIAGNOSIS — G8918 Other acute postprocedural pain: Secondary | ICD-10-CM | POA: Diagnosis not present

## 2016-05-26 DIAGNOSIS — D649 Anemia, unspecified: Secondary | ICD-10-CM | POA: Diagnosis not present

## 2016-05-31 DIAGNOSIS — J101 Influenza due to other identified influenza virus with other respiratory manifestations: Secondary | ICD-10-CM | POA: Diagnosis not present

## 2016-06-12 DIAGNOSIS — M80052D Age-related osteoporosis with current pathological fracture, left femur, subsequent encounter for fracture with routine healing: Secondary | ICD-10-CM | POA: Diagnosis not present

## 2016-06-12 DIAGNOSIS — I129 Hypertensive chronic kidney disease with stage 1 through stage 4 chronic kidney disease, or unspecified chronic kidney disease: Secondary | ICD-10-CM | POA: Diagnosis not present

## 2016-06-12 DIAGNOSIS — Z6830 Body mass index (BMI) 30.0-30.9, adult: Secondary | ICD-10-CM | POA: Diagnosis not present

## 2016-06-12 DIAGNOSIS — I482 Chronic atrial fibrillation: Secondary | ICD-10-CM | POA: Diagnosis not present

## 2016-06-12 DIAGNOSIS — Z79891 Long term (current) use of opiate analgesic: Secondary | ICD-10-CM | POA: Diagnosis not present

## 2016-06-12 DIAGNOSIS — Z7901 Long term (current) use of anticoagulants: Secondary | ICD-10-CM | POA: Diagnosis not present

## 2016-06-12 DIAGNOSIS — N189 Chronic kidney disease, unspecified: Secondary | ICD-10-CM | POA: Diagnosis not present

## 2016-06-12 DIAGNOSIS — Z87891 Personal history of nicotine dependence: Secondary | ICD-10-CM | POA: Diagnosis not present

## 2016-06-12 DIAGNOSIS — M1712 Unilateral primary osteoarthritis, left knee: Secondary | ICD-10-CM | POA: Diagnosis not present

## 2016-06-12 DIAGNOSIS — F329 Major depressive disorder, single episode, unspecified: Secondary | ICD-10-CM | POA: Diagnosis not present

## 2016-06-12 DIAGNOSIS — E669 Obesity, unspecified: Secondary | ICD-10-CM | POA: Diagnosis not present

## 2016-06-12 DIAGNOSIS — Z9181 History of falling: Secondary | ICD-10-CM | POA: Diagnosis not present

## 2016-06-15 DIAGNOSIS — M1712 Unilateral primary osteoarthritis, left knee: Secondary | ICD-10-CM | POA: Diagnosis not present

## 2016-06-15 DIAGNOSIS — M80052D Age-related osteoporosis with current pathological fracture, left femur, subsequent encounter for fracture with routine healing: Secondary | ICD-10-CM | POA: Diagnosis not present

## 2016-06-15 DIAGNOSIS — N189 Chronic kidney disease, unspecified: Secondary | ICD-10-CM | POA: Diagnosis not present

## 2016-06-15 DIAGNOSIS — I129 Hypertensive chronic kidney disease with stage 1 through stage 4 chronic kidney disease, or unspecified chronic kidney disease: Secondary | ICD-10-CM | POA: Diagnosis not present

## 2016-06-15 DIAGNOSIS — F329 Major depressive disorder, single episode, unspecified: Secondary | ICD-10-CM | POA: Diagnosis not present

## 2016-06-15 DIAGNOSIS — I482 Chronic atrial fibrillation: Secondary | ICD-10-CM | POA: Diagnosis not present

## 2016-06-16 ENCOUNTER — Telehealth: Payer: Self-pay | Admitting: Cardiology

## 2016-06-16 NOTE — Telephone Encounter (Signed)
LMOM to call back about order for INR

## 2016-06-16 NOTE — Telephone Encounter (Signed)
New message      Calling to let coumadin clinic know that pt broke his hip a few weeks ago.  He is home from rehab but cannot come in for pt/inr.  If you want [facility] to check his pt, please call and give them the order.  Pt not sure when he will be able to come in for pt/inr

## 2016-06-16 NOTE — Telephone Encounter (Signed)
Sp[facility] with [facility] with [facility] and gave orders to check pt on Thursday at their next home health visit. She is aware to call results to us.

## 2016-06-17 DIAGNOSIS — I480 Paroxysmal atrial fibrillation: Secondary | ICD-10-CM | POA: Diagnosis not present

## 2016-06-17 DIAGNOSIS — I129 Hypertensive chronic kidney disease with stage 1 through stage 4 chronic kidney disease, or unspecified chronic kidney disease: Secondary | ICD-10-CM | POA: Diagnosis not present

## 2016-06-17 DIAGNOSIS — M80052D Age-related osteoporosis with current pathological fracture, left femur, subsequent encounter for fracture with routine healing: Secondary | ICD-10-CM | POA: Diagnosis not present

## 2016-06-17 DIAGNOSIS — F329 Major depressive disorder, single episode, unspecified: Secondary | ICD-10-CM | POA: Diagnosis not present

## 2016-06-17 DIAGNOSIS — I482 Chronic atrial fibrillation: Secondary | ICD-10-CM | POA: Diagnosis not present

## 2016-06-17 DIAGNOSIS — N189 Chronic kidney disease, unspecified: Secondary | ICD-10-CM | POA: Diagnosis not present

## 2016-06-17 DIAGNOSIS — M1712 Unilateral primary osteoarthritis, left knee: Secondary | ICD-10-CM | POA: Diagnosis not present

## 2016-06-17 LAB — PROTIME-INR: INR: 2.3 — AB (ref 0.9–1.1)

## 2016-06-18 ENCOUNTER — Ambulatory Visit (INDEPENDENT_AMBULATORY_CARE_PROVIDER_SITE_OTHER): Payer: Medicare Other

## 2016-06-18 DIAGNOSIS — I481 Persistent atrial fibrillation: Secondary | ICD-10-CM

## 2016-06-18 DIAGNOSIS — Z5181 Encounter for therapeutic drug level monitoring: Secondary | ICD-10-CM

## 2016-06-18 DIAGNOSIS — M1712 Unilateral primary osteoarthritis, left knee: Secondary | ICD-10-CM | POA: Diagnosis not present

## 2016-06-18 DIAGNOSIS — I4819 Other persistent atrial fibrillation: Secondary | ICD-10-CM

## 2016-06-19 DIAGNOSIS — N189 Chronic kidney disease, unspecified: Secondary | ICD-10-CM | POA: Diagnosis not present

## 2016-06-19 DIAGNOSIS — M1712 Unilateral primary osteoarthritis, left knee: Secondary | ICD-10-CM | POA: Diagnosis not present

## 2016-06-19 DIAGNOSIS — I129 Hypertensive chronic kidney disease with stage 1 through stage 4 chronic kidney disease, or unspecified chronic kidney disease: Secondary | ICD-10-CM | POA: Diagnosis not present

## 2016-06-19 DIAGNOSIS — F329 Major depressive disorder, single episode, unspecified: Secondary | ICD-10-CM | POA: Diagnosis not present

## 2016-06-19 DIAGNOSIS — M80052D Age-related osteoporosis with current pathological fracture, left femur, subsequent encounter for fracture with routine healing: Secondary | ICD-10-CM | POA: Diagnosis not present

## 2016-06-19 DIAGNOSIS — I482 Chronic atrial fibrillation: Secondary | ICD-10-CM | POA: Diagnosis not present

## 2016-06-22 DIAGNOSIS — M1712 Unilateral primary osteoarthritis, left knee: Secondary | ICD-10-CM | POA: Diagnosis not present

## 2016-06-22 DIAGNOSIS — F329 Major depressive disorder, single episode, unspecified: Secondary | ICD-10-CM | POA: Diagnosis not present

## 2016-06-22 DIAGNOSIS — I482 Chronic atrial fibrillation: Secondary | ICD-10-CM | POA: Diagnosis not present

## 2016-06-22 DIAGNOSIS — M80052D Age-related osteoporosis with current pathological fracture, left femur, subsequent encounter for fracture with routine healing: Secondary | ICD-10-CM | POA: Diagnosis not present

## 2016-06-22 DIAGNOSIS — N189 Chronic kidney disease, unspecified: Secondary | ICD-10-CM | POA: Diagnosis not present

## 2016-06-22 DIAGNOSIS — I129 Hypertensive chronic kidney disease with stage 1 through stage 4 chronic kidney disease, or unspecified chronic kidney disease: Secondary | ICD-10-CM | POA: Diagnosis not present

## 2016-06-24 DIAGNOSIS — N183 Chronic kidney disease, stage 3 (moderate): Secondary | ICD-10-CM | POA: Diagnosis not present

## 2016-06-24 DIAGNOSIS — A0472 Enterocolitis due to Clostridium difficile, not specified as recurrent: Secondary | ICD-10-CM | POA: Diagnosis not present

## 2016-06-24 DIAGNOSIS — F418 Other specified anxiety disorders: Secondary | ICD-10-CM | POA: Diagnosis not present

## 2016-06-24 DIAGNOSIS — M80052D Age-related osteoporosis with current pathological fracture, left femur, subsequent encounter for fracture with routine healing: Secondary | ICD-10-CM | POA: Diagnosis not present

## 2016-06-24 DIAGNOSIS — E119 Type 2 diabetes mellitus without complications: Secondary | ICD-10-CM | POA: Diagnosis not present

## 2016-06-24 DIAGNOSIS — I482 Chronic atrial fibrillation: Secondary | ICD-10-CM | POA: Diagnosis not present

## 2016-06-24 DIAGNOSIS — I129 Hypertensive chronic kidney disease with stage 1 through stage 4 chronic kidney disease, or unspecified chronic kidney disease: Secondary | ICD-10-CM | POA: Diagnosis not present

## 2016-06-24 DIAGNOSIS — I10 Essential (primary) hypertension: Secondary | ICD-10-CM | POA: Diagnosis not present

## 2016-06-24 DIAGNOSIS — Z9889 Other specified postprocedural states: Secondary | ICD-10-CM | POA: Diagnosis not present

## 2016-06-24 DIAGNOSIS — M109 Gout, unspecified: Secondary | ICD-10-CM | POA: Diagnosis not present

## 2016-06-24 DIAGNOSIS — I480 Paroxysmal atrial fibrillation: Secondary | ICD-10-CM | POA: Diagnosis not present

## 2016-06-24 DIAGNOSIS — N189 Chronic kidney disease, unspecified: Secondary | ICD-10-CM | POA: Diagnosis not present

## 2016-06-24 DIAGNOSIS — M1712 Unilateral primary osteoarthritis, left knee: Secondary | ICD-10-CM | POA: Diagnosis not present

## 2016-06-24 DIAGNOSIS — F329 Major depressive disorder, single episode, unspecified: Secondary | ICD-10-CM | POA: Diagnosis not present

## 2016-06-24 LAB — PROTIME-INR: INR: 2.8 — AB (ref 0.9–1.1)

## 2016-06-25 ENCOUNTER — Ambulatory Visit (INDEPENDENT_AMBULATORY_CARE_PROVIDER_SITE_OTHER): Payer: Medicare Other | Admitting: Cardiovascular Disease

## 2016-06-25 DIAGNOSIS — Z5181 Encounter for therapeutic drug level monitoring: Secondary | ICD-10-CM

## 2016-06-25 DIAGNOSIS — M1712 Unilateral primary osteoarthritis, left knee: Secondary | ICD-10-CM | POA: Diagnosis not present

## 2016-06-25 DIAGNOSIS — I481 Persistent atrial fibrillation: Secondary | ICD-10-CM

## 2016-06-25 DIAGNOSIS — I4819 Other persistent atrial fibrillation: Secondary | ICD-10-CM

## 2016-06-25 NOTE — Progress Notes (Signed)
Cardiology Office Note   Date:  06/26/2016   ID:  Ralph Sullivan, DOB 06/07/1945, MRN [id number]  PCP:  Lorenda Ishiharaupashree Varadarajan, MD  Cardiologist:  Dr. Anne FuSkains    Chief Complaint  Patient presents with  . Atrial Fibrillation      History of Present Illness: Ralph Sullivan is a 71 y.o. male who presents for permanent atrial fib , RBBB, LAFB.  An echocardiogram 6/14 demonstrated aortic root size 4.5 cm with mild LVH, normal EF. On repeat 6/15 - 4.2cm and 09/2015 normal EF and ascending aorta  4.15 -PA pressure 38 mmHG.   Pt with recent broken hip and had it pinned.  [facility]   There he began with loser ext edema.  He also lost 10 lbs and does admit the food was salty.  Normally is careful with salt but his Kateri McUncle believes he eats foods with salt in them.  The swelling is more with ambulation.  His BP is lower and his clonidine was decreased and he stopped the amlodipine for swelling.  The swelling has improved.  He could not wear the support stockings due to gout pain.  He has CKD and gout so diuretic has not been prescribed.    He has no SOB and no chest pain.  HH comes to his home to help with rehab and family members are helping as well.    His INR on coumadin has been controlled.  He has occ rapid HR despite toprol of 200 BID.  But brief episodes.     Past Medical History:  Diagnosis Date  . Atrial fibrillation (HCC) 02/08/2013   CHADS-VAS - 2 (AGE, HTN). On anticoagulation. Holter 6/14 - 2.3 sec pause. Avg HR 87. Rate control   . Bifascicular block 03/28/2013  . Bilateral congenital hammer toes   . Chronic anticoagulation 03/28/2013  . Chronic kidney disease, stage III (moderate) 03/28/2013  . 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 Gout   . Hyperhidrosis 03/28/2013  . Obesity, unspecified 03/28/2013  . Rhabdomyolysis 09/2015  . 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 CATARACT EXTRACTION    . HERNIA REPAIR    . KIDNEY STONE SURGERY       Current Outpatient Prescriptions  Medication Sig Dispense Refill  . acetaminophen (TYLENOL) 325 MG tablet Take 325 mg by mouth every 4 (four) hours as needed for mild pain or moderate pain.    Marland Kitchen. allopurinol (ZYLOPRIM) 300 MG tablet Take 300 mg by mouth daily.     Marland Kitchen. buPROPion (WELLBUTRIN XL) 150 MG 24 hr tablet Take 150 mg by mouth daily.    . CHROMIUM-CINNAMON PO Take 200 mg by mouth at bedtime.     . cloNIDine (CATAPRES) 0.2 MG tablet Take 0.2 mg by mouth 2 (two) times daily.     . ferrous sulfate 325 (65 FE) MG tablet Take 325 mg by mouth daily with breakfast.    . fluticasone (FLONASE) 50 MCG/ACT nasal spray Place 2 sprays into both nostrils daily.     Marland Kitchen. loratadine (CLARITIN) 10 MG tablet Take 10 mg by mouth daily.    . metoprolol (LOPRESSOR) 100 MG tablet Take 200 mg by mouth 2 (two) times daily.     . Omega-3 Fatty Acids (OMEGA 3 PO) Take 1 capsule by mouth 2 (two) times daily.     .Marland Kitchen  vitamin E 400 UNIT capsule Take 400 Units by mouth daily.    Marland Kitchen warfarin (COUMADIN) 5 MG tablet TAKE AS DIRECTED BY  COUMADIN  CLINIC. 40 tablet 3   No current facility-administered medications for this visit.     Allergies:   Duloxetine and Sulfur    Social History:  The patient  reports that he quit smoking about 71 years ago. He has never used smokeless tobacco. He reports that he does not drink alcohol or use drugs.   Family History:  The patient's family history includes Diabetes in his mother; Hypertension in his mother.    ROS:  General:no colds or fevers, + weight changes Skin:no rashes or ulcers HEENT:no blurred vision, no congestion CV:see HPI PUL:see HPI GI:no diarrhea constipation or melena, no indigestion GU:no hematuria, no dysuria MS:no joint pain, no claudication, + edema Neuro:no syncope, no lightheadedness Endo:no diabetes, no thyroid disease  Wt Readings from  Last 3 Encounters:  06/26/16 200 lb (90.7 kg)  03/16/16 210 lb 12.8 oz (95.6 kg)  11/12/15 200 lb (90.7 kg)     PHYSICAL EXAM: VS:  BP 106/72   Pulse 82   Ht 6' (1.829 m)   Wt 200 lb (90.7 kg)   SpO2 96%   BMI 27.12 kg/m  , BMI Body mass index is 27.12 kg/m. General:Pleasant affect, NAD Skin:Warm and dry, brisk capillary refill HEENT:normocephalic, sclera clear, mucus membranes moist Neck:supple, no JVD, no bruits  Heart:irreg irreg.  without murmur, gallup, rub or click Lungs:clear without rales, rhonchi, or wheezes ZOX:WRUE, non tender, + BS, do not palpate liver spleen or masses Ext:1+ at ankles of  lower ext edema, 2+ pedal pulses, 2+ radial pulses Neuro:alert and oriented X 3, MAE, follows commands, + facial symmetry    EKG:  EKG is ordered today. The ekg ordered today demonstrates a fib with rate control RBBB and LAFB.   Recent Labs: 10/15/2015: TSH 0.645 10/18/2015: Magnesium 1.6 11/12/2015: ALT 18 11/15/2015: BUN 15; Creatinine, Ser 1.51; Hemoglobin 9.1; Platelets 227; Potassium 3.8; Sodium 132    Lipid Panel No results found for: CHOL, TRIG, HDL, CHOLHDL, VLDL, LDLCALC, LDLDIRECT     Other studies Reviewed: Additional studies/ records that were reviewed today include:  ECHO: 09/2015 Study Conclusions  - Left ventricle: The cavity size was normal. There was mild   concentric hypertrophy. Systolic function was normal. The   estimated ejection fraction was in the range of 60% to 65%. Wall   motion was normal; there were no regional wall motion   abnormalities. - Aortic valve: Transvalvular velocity was within the normal range.   There was no stenosis. There was trivial regurgitation. - Mitral valve: Transvalvular velocity was within the normal range.   There was no evidence for stenosis. There was trivial   regurgitation. - Left atrium: The atrium was severely dilated. - Right ventricle: The cavity size was normal. Wall thickness was   normal. Systolic  function was normal. - Right atrium: The atrium was moderately dilated. - Atrial septum: No defect or patent foramen ovale was identified   by color flow Doppler. - Tricuspid valve: There was mild regurgitation. - Pulmonary arteries: Systolic pressure was mildly increased. PA   peak pressure: 38 mm Hg (S).  ASSESSMENT AND PLAN:  1. Atrial fibrillation persistent-with RBBB is stable rate, previous holter with 2.3 sec pause  2. Lower ext edema, pt stopped amlodipine - agree with stopping amlodpine BP today is 106 systolic and clonidine has been decreased.  Pt down  10 lbs.  His edema has improved.  Though still with some in ankles.  Would order lasix PRN but Cr last one I have is 1.51 will obtain labs from PCP.  He is to see Dr. Hyman Hopes with renal next week.  Will ask Dr. Hyman Hopes to addres Lasix PRN.  Last echo with normal EF, he does eat food with salt but he does not add salt.   He had trouble wearing support stockings due to gout and pain.  We discussed ace wraps or less strong support stockings.    3. Dilated aortic root - 42mm stable. Echocardiogram reviewed, last echo 10/13/13- and in 6./2017  ascending aorta  4.15  4. Chronic kidney disease stage III- Dr. Hyman Hopes. Avoid NSAIDs 5. Chronic anticoagulation-we are monitoring her in Coumadin clinic. No bleeding. No side effects. No strokelike symptoms.  6. Follow up with Dr. Anne Fu in May .   Current medicines are reviewed with the patient today.  The patient Has no concerns regarding medicines.  The following changes have been made:  See above Labs/ tests ordered today include:see above  Disposition:   FU:  see above  Signed, Nada Boozer, NP  06/26/2016 8:54 AM    [facility] [address] [place], [place], [place]  [address]/ [address] [place], [place] Phone: [phone]; Fax: [phone]  [phone]

## 2016-06-26 ENCOUNTER — Ambulatory Visit (INDEPENDENT_AMBULATORY_CARE_PROVIDER_SITE_OTHER): Payer: Medicare Other | Admitting: Cardiology

## 2016-06-26 ENCOUNTER — Encounter: Payer: Self-pay | Admitting: Cardiology

## 2016-06-26 VITALS — BP 106/72 | HR 82 | Ht 72.0 in | Wt 200.0 lb

## 2016-06-26 DIAGNOSIS — R600 Localized edema: Secondary | ICD-10-CM | POA: Diagnosis not present

## 2016-06-26 DIAGNOSIS — I10 Essential (primary) hypertension: Secondary | ICD-10-CM

## 2016-06-26 DIAGNOSIS — Z7901 Long term (current) use of anticoagulants: Secondary | ICD-10-CM

## 2016-06-26 DIAGNOSIS — I481 Persistent atrial fibrillation: Secondary | ICD-10-CM | POA: Diagnosis not present

## 2016-06-26 DIAGNOSIS — I4819 Other persistent atrial fibrillation: Secondary | ICD-10-CM

## 2016-06-26 DIAGNOSIS — I77810 Thoracic aortic ectasia: Secondary | ICD-10-CM | POA: Diagnosis not present

## 2016-06-26 NOTE — Patient Instructions (Signed)
Medication Instructions:  Your physician has recommended you make the following change in your medication:   Labwork: None ordered  Testing/Procedures: None ordered  Follow-Up: Your physician recommends that you schedule a follow-up appointment in: IN MAY AS SCHEDULED WITH DR. Anne FuSKAINS   Any Other Special Instructions Will Be Listed Below (If Applicable).   If you need a refill on your cardiac medications before your next appointment, please call your pharmacy.

## 2016-06-29 DIAGNOSIS — I482 Chronic atrial fibrillation: Secondary | ICD-10-CM | POA: Diagnosis not present

## 2016-06-29 DIAGNOSIS — N189 Chronic kidney disease, unspecified: Secondary | ICD-10-CM | POA: Diagnosis not present

## 2016-06-29 DIAGNOSIS — F329 Major depressive disorder, single episode, unspecified: Secondary | ICD-10-CM | POA: Diagnosis not present

## 2016-06-29 DIAGNOSIS — M80052D Age-related osteoporosis with current pathological fracture, left femur, subsequent encounter for fracture with routine healing: Secondary | ICD-10-CM | POA: Diagnosis not present

## 2016-06-29 DIAGNOSIS — M1712 Unilateral primary osteoarthritis, left knee: Secondary | ICD-10-CM | POA: Diagnosis not present

## 2016-06-29 DIAGNOSIS — I129 Hypertensive chronic kidney disease with stage 1 through stage 4 chronic kidney disease, or unspecified chronic kidney disease: Secondary | ICD-10-CM | POA: Diagnosis not present

## 2016-07-01 DIAGNOSIS — M80052D Age-related osteoporosis with current pathological fracture, left femur, subsequent encounter for fracture with routine healing: Secondary | ICD-10-CM | POA: Diagnosis not present

## 2016-07-01 DIAGNOSIS — M1712 Unilateral primary osteoarthritis, left knee: Secondary | ICD-10-CM | POA: Diagnosis not present

## 2016-07-01 DIAGNOSIS — F329 Major depressive disorder, single episode, unspecified: Secondary | ICD-10-CM | POA: Diagnosis not present

## 2016-07-01 DIAGNOSIS — I129 Hypertensive chronic kidney disease with stage 1 through stage 4 chronic kidney disease, or unspecified chronic kidney disease: Secondary | ICD-10-CM | POA: Diagnosis not present

## 2016-07-01 DIAGNOSIS — N189 Chronic kidney disease, unspecified: Secondary | ICD-10-CM | POA: Diagnosis not present

## 2016-07-01 DIAGNOSIS — I482 Chronic atrial fibrillation: Secondary | ICD-10-CM | POA: Diagnosis not present

## 2016-07-02 DIAGNOSIS — M1712 Unilateral primary osteoarthritis, left knee: Secondary | ICD-10-CM | POA: Diagnosis not present

## 2016-07-03 DIAGNOSIS — N189 Chronic kidney disease, unspecified: Secondary | ICD-10-CM | POA: Diagnosis not present

## 2016-07-03 DIAGNOSIS — M1712 Unilateral primary osteoarthritis, left knee: Secondary | ICD-10-CM | POA: Diagnosis not present

## 2016-07-03 DIAGNOSIS — I482 Chronic atrial fibrillation: Secondary | ICD-10-CM | POA: Diagnosis not present

## 2016-07-03 DIAGNOSIS — F329 Major depressive disorder, single episode, unspecified: Secondary | ICD-10-CM | POA: Diagnosis not present

## 2016-07-03 DIAGNOSIS — I129 Hypertensive chronic kidney disease with stage 1 through stage 4 chronic kidney disease, or unspecified chronic kidney disease: Secondary | ICD-10-CM | POA: Diagnosis not present

## 2016-07-03 DIAGNOSIS — M80052D Age-related osteoporosis with current pathological fracture, left femur, subsequent encounter for fracture with routine healing: Secondary | ICD-10-CM | POA: Diagnosis not present

## 2016-07-06 DIAGNOSIS — I129 Hypertensive chronic kidney disease with stage 1 through stage 4 chronic kidney disease, or unspecified chronic kidney disease: Secondary | ICD-10-CM | POA: Diagnosis not present

## 2016-07-06 DIAGNOSIS — M1712 Unilateral primary osteoarthritis, left knee: Secondary | ICD-10-CM | POA: Diagnosis not present

## 2016-07-06 DIAGNOSIS — M80052D Age-related osteoporosis with current pathological fracture, left femur, subsequent encounter for fracture with routine healing: Secondary | ICD-10-CM | POA: Diagnosis not present

## 2016-07-06 DIAGNOSIS — N189 Chronic kidney disease, unspecified: Secondary | ICD-10-CM | POA: Diagnosis not present

## 2016-07-06 DIAGNOSIS — F329 Major depressive disorder, single episode, unspecified: Secondary | ICD-10-CM | POA: Diagnosis not present

## 2016-07-06 DIAGNOSIS — I482 Chronic atrial fibrillation: Secondary | ICD-10-CM | POA: Diagnosis not present

## 2016-07-08 DIAGNOSIS — I482 Chronic atrial fibrillation: Secondary | ICD-10-CM | POA: Diagnosis not present

## 2016-07-08 DIAGNOSIS — I480 Paroxysmal atrial fibrillation: Secondary | ICD-10-CM | POA: Diagnosis not present

## 2016-07-08 DIAGNOSIS — M80052D Age-related osteoporosis with current pathological fracture, left femur, subsequent encounter for fracture with routine healing: Secondary | ICD-10-CM | POA: Diagnosis not present

## 2016-07-08 DIAGNOSIS — M1712 Unilateral primary osteoarthritis, left knee: Secondary | ICD-10-CM | POA: Diagnosis not present

## 2016-07-08 DIAGNOSIS — N189 Chronic kidney disease, unspecified: Secondary | ICD-10-CM | POA: Diagnosis not present

## 2016-07-08 DIAGNOSIS — I129 Hypertensive chronic kidney disease with stage 1 through stage 4 chronic kidney disease, or unspecified chronic kidney disease: Secondary | ICD-10-CM | POA: Diagnosis not present

## 2016-07-08 DIAGNOSIS — F329 Major depressive disorder, single episode, unspecified: Secondary | ICD-10-CM | POA: Diagnosis not present

## 2016-07-08 LAB — PROTIME-INR: INR: 1.8 — AB (ref 0.9–1.1)

## 2016-07-09 ENCOUNTER — Ambulatory Visit (INDEPENDENT_AMBULATORY_CARE_PROVIDER_SITE_OTHER): Payer: Medicare Other | Admitting: Cardiovascular Disease

## 2016-07-09 DIAGNOSIS — N189 Chronic kidney disease, unspecified: Secondary | ICD-10-CM | POA: Diagnosis not present

## 2016-07-09 DIAGNOSIS — I481 Persistent atrial fibrillation: Secondary | ICD-10-CM

## 2016-07-09 DIAGNOSIS — M1712 Unilateral primary osteoarthritis, left knee: Secondary | ICD-10-CM | POA: Diagnosis not present

## 2016-07-09 DIAGNOSIS — I482 Chronic atrial fibrillation: Secondary | ICD-10-CM | POA: Diagnosis not present

## 2016-07-09 DIAGNOSIS — Z5181 Encounter for therapeutic drug level monitoring: Secondary | ICD-10-CM

## 2016-07-09 DIAGNOSIS — I129 Hypertensive chronic kidney disease with stage 1 through stage 4 chronic kidney disease, or unspecified chronic kidney disease: Secondary | ICD-10-CM | POA: Diagnosis not present

## 2016-07-09 DIAGNOSIS — M80052D Age-related osteoporosis with current pathological fracture, left femur, subsequent encounter for fracture with routine healing: Secondary | ICD-10-CM | POA: Diagnosis not present

## 2016-07-09 DIAGNOSIS — F329 Major depressive disorder, single episode, unspecified: Secondary | ICD-10-CM | POA: Diagnosis not present

## 2016-07-09 DIAGNOSIS — I4819 Other persistent atrial fibrillation: Secondary | ICD-10-CM

## 2016-07-13 DIAGNOSIS — N189 Chronic kidney disease, unspecified: Secondary | ICD-10-CM | POA: Diagnosis not present

## 2016-07-13 DIAGNOSIS — I129 Hypertensive chronic kidney disease with stage 1 through stage 4 chronic kidney disease, or unspecified chronic kidney disease: Secondary | ICD-10-CM | POA: Diagnosis not present

## 2016-07-13 DIAGNOSIS — M1712 Unilateral primary osteoarthritis, left knee: Secondary | ICD-10-CM | POA: Diagnosis not present

## 2016-07-13 DIAGNOSIS — M80052D Age-related osteoporosis with current pathological fracture, left femur, subsequent encounter for fracture with routine healing: Secondary | ICD-10-CM | POA: Diagnosis not present

## 2016-07-13 DIAGNOSIS — I482 Chronic atrial fibrillation: Secondary | ICD-10-CM | POA: Diagnosis not present

## 2016-07-13 DIAGNOSIS — F329 Major depressive disorder, single episode, unspecified: Secondary | ICD-10-CM | POA: Diagnosis not present

## 2016-07-15 DIAGNOSIS — M80052D Age-related osteoporosis with current pathological fracture, left femur, subsequent encounter for fracture with routine healing: Secondary | ICD-10-CM | POA: Diagnosis not present

## 2016-07-15 DIAGNOSIS — N189 Chronic kidney disease, unspecified: Secondary | ICD-10-CM | POA: Diagnosis not present

## 2016-07-15 DIAGNOSIS — I129 Hypertensive chronic kidney disease with stage 1 through stage 4 chronic kidney disease, or unspecified chronic kidney disease: Secondary | ICD-10-CM | POA: Diagnosis not present

## 2016-07-15 DIAGNOSIS — F329 Major depressive disorder, single episode, unspecified: Secondary | ICD-10-CM | POA: Diagnosis not present

## 2016-07-15 DIAGNOSIS — I482 Chronic atrial fibrillation: Secondary | ICD-10-CM | POA: Diagnosis not present

## 2016-07-15 DIAGNOSIS — M1712 Unilateral primary osteoarthritis, left knee: Secondary | ICD-10-CM | POA: Diagnosis not present

## 2016-07-16 ENCOUNTER — Encounter (HOSPITAL_COMMUNITY): Payer: Self-pay | Admitting: Emergency Medicine

## 2016-07-16 ENCOUNTER — Encounter (HOSPITAL_COMMUNITY)
Admission: EM | Disposition: A | Discharge status: Home / Self Care | Payer: Self-pay | Source: Home / Self Care | Attending: Emergency Medicine

## 2016-07-16 ENCOUNTER — Observation Stay (HOSPITAL_COMMUNITY)
Admission: EM | Admit: 2016-07-16 | Discharge: 2016-07-17 | Disposition: A | Discharge status: Home / Self Care | Payer: Medicare Other | Attending: General Surgery | Admitting: General Surgery

## 2016-07-16 ENCOUNTER — Emergency Department (HOSPITAL_COMMUNITY): Payer: Medicare Other

## 2016-07-16 ENCOUNTER — Observation Stay (HOSPITAL_COMMUNITY): Payer: Medicare Other | Admitting: Certified Registered Nurse Anesthetist

## 2016-07-16 DIAGNOSIS — Z87891 Personal history of nicotine dependence: Secondary | ICD-10-CM | POA: Diagnosis not present

## 2016-07-16 DIAGNOSIS — I452 Bifascicular block: Secondary | ICD-10-CM | POA: Diagnosis not present

## 2016-07-16 DIAGNOSIS — K4031 Unilateral inguinal hernia, with obstruction, without gangrene, recurrent: Secondary | ICD-10-CM | POA: Diagnosis not present

## 2016-07-16 DIAGNOSIS — N183 Chronic kidney disease, stage 3 unspecified: Secondary | ICD-10-CM | POA: Diagnosis present

## 2016-07-16 DIAGNOSIS — Z01818 Encounter for other preprocedural examination: Secondary | ICD-10-CM | POA: Diagnosis not present

## 2016-07-16 DIAGNOSIS — I739 Peripheral vascular disease, unspecified: Secondary | ICD-10-CM | POA: Diagnosis not present

## 2016-07-16 DIAGNOSIS — Z8673 Personal history of transient ischemic attack (TIA), and cerebral infarction without residual deficits: Secondary | ICD-10-CM | POA: Insufficient documentation

## 2016-07-16 DIAGNOSIS — K4030 Unilateral inguinal hernia, with obstruction, without gangrene, not specified as recurrent: Secondary | ICD-10-CM | POA: Diagnosis present

## 2016-07-16 DIAGNOSIS — E669 Obesity, unspecified: Secondary | ICD-10-CM | POA: Diagnosis not present

## 2016-07-16 DIAGNOSIS — I481 Persistent atrial fibrillation: Secondary | ICD-10-CM | POA: Insufficient documentation

## 2016-07-16 DIAGNOSIS — M109 Gout, unspecified: Secondary | ICD-10-CM | POA: Insufficient documentation

## 2016-07-16 DIAGNOSIS — I4891 Unspecified atrial fibrillation: Secondary | ICD-10-CM | POA: Diagnosis present

## 2016-07-16 DIAGNOSIS — R1900 Intra-abdominal and pelvic swelling, mass and lump, unspecified site: Secondary | ICD-10-CM | POA: Diagnosis not present

## 2016-07-16 DIAGNOSIS — I129 Hypertensive chronic kidney disease with stage 1 through stage 4 chronic kidney disease, or unspecified chronic kidney disease: Secondary | ICD-10-CM | POA: Diagnosis not present

## 2016-07-16 DIAGNOSIS — K4090 Unilateral inguinal hernia, without obstruction or gangrene, not specified as recurrent: Secondary | ICD-10-CM

## 2016-07-16 DIAGNOSIS — Z7901 Long term (current) use of anticoagulants: Secondary | ICD-10-CM | POA: Diagnosis not present

## 2016-07-16 HISTORY — PX: INGUINAL HERNIA REPAIR: SUR1180

## 2016-07-16 HISTORY — PX: INSERTION OF MESH: SHX5868

## 2016-07-16 HISTORY — PX: INGUINAL HERNIA REPAIR: SHX194

## 2016-07-16 LAB — PROTIME-INR
INR: 1.5
PROTHROMBIN TIME: 18.2 s — AB (ref 11.4–15.2)

## 2016-07-16 LAB — COMPREHENSIVE METABOLIC PANEL
ALT: 12 U/L — ABNORMAL LOW (ref 17–63)
ANION GAP: 11 (ref 5–15)
AST: 27 U/L (ref 15–41)
Albumin: 2.6 g/dL — ABNORMAL LOW (ref 3.5–5.0)
Alkaline Phosphatase: 101 U/L (ref 38–126)
BUN: 18 mg/dL (ref 6–20)
CO2: 28 mmol/L (ref 22–32)
Calcium: 9.4 mg/dL (ref 8.9–10.3)
Chloride: 97 mmol/L — ABNORMAL LOW (ref 101–111)
Creatinine, Ser: 1.66 mg/dL — ABNORMAL HIGH (ref 0.61–1.24)
GFR calc Af Amer: 47 mL/min — ABNORMAL LOW (ref >60)
GFR, EST NON AFRICAN AMERICAN: 41 mL/min — AB (ref >60)
GLUCOSE: 98 mg/dL (ref 65–99)
POTASSIUM: 4.1 mmol/L (ref 3.5–5.1)
Sodium: 136 mmol/L (ref 135–145)
TOTAL PROTEIN: 5.8 g/dL — AB (ref 6.5–8.1)
Total Bilirubin: 0.6 mg/dL (ref 0.3–1.2)

## 2016-07-16 LAB — CBC WITH DIFFERENTIAL/PLATELET
BASOS ABS: 0.1 10*3/uL (ref 0.0–0.1)
Basophils Relative: 1 %
EOS PCT: 3 %
Eosinophils Absolute: 0.3 10*3/uL (ref 0.0–0.7)
HCT: 34.5 % — ABNORMAL LOW (ref 39.0–52.0)
Hemoglobin: 10.4 g/dL — ABNORMAL LOW (ref 13.0–17.0)
LYMPHS PCT: 23 %
Lymphs Abs: 2.3 10*3/uL (ref 0.7–4.0)
MCH: 24.9 pg — ABNORMAL LOW (ref 26.0–34.0)
MCHC: 30.1 g/dL (ref 30.0–36.0)
MCV: 82.7 fL (ref 78.0–100.0)
MONO ABS: 1 10*3/uL (ref 0.1–1.0)
Monocytes Relative: 9 %
Neutro Abs: 6.4 10*3/uL (ref 1.7–7.7)
Neutrophils Relative %: 64 %
Platelets: 214 10*3/uL (ref 150–400)
RBC: 4.17 MIL/uL — ABNORMAL LOW (ref 4.22–5.81)
RDW: 17.6 % — AB (ref 11.5–15.5)
WBC: 10.1 10*3/uL (ref 4.0–10.5)

## 2016-07-16 LAB — TYPE AND SCREEN
ABO/RH(D): O NEG
ANTIBODY SCREEN: NEGATIVE

## 2016-07-16 LAB — SURGICAL PCR SCREEN
MRSA, PCR: NEGATIVE
Staphylococcus aureus: POSITIVE — AB

## 2016-07-16 LAB — ABO/RH: ABO/RH(D): O NEG

## 2016-07-16 SURGERY — REPAIR, HERNIA, INGUINAL, LAPAROSCOPIC
Anesthesia: General | Site: Groin | Laterality: Left

## 2016-07-16 MED ORDER — CLONIDINE HCL 0.2 MG PO TABS
0.2000 mg | ORAL_TABLET | Freq: Two times a day (BID) | ORAL | Status: DC
  Administered 2016-07-16 – 2016-07-17 (×2): 0.2 mg via ORAL
  Filled 2016-07-16 (×2): qty 1

## 2016-07-16 MED ORDER — HYDROMORPHONE HCL 1 MG/ML IJ SOLN
0.2500 mg | INTRAMUSCULAR | Status: DC | PRN
  Administered 2016-07-16: 0.5 mg via INTRAVENOUS

## 2016-07-16 MED ORDER — MUPIROCIN 2 % EX OINT
1.0000 "application " | TOPICAL_OINTMENT | Freq: Two times a day (BID) | CUTANEOUS | Status: DC
  Administered 2016-07-16 – 2016-07-17 (×2): 1 via NASAL
  Filled 2016-07-16: qty 22

## 2016-07-16 MED ORDER — HYDROMORPHONE HCL 1 MG/ML IJ SOLN
INTRAMUSCULAR | Status: AC
  Filled 2016-07-16: qty 0.5

## 2016-07-16 MED ORDER — HYDROCODONE-ACETAMINOPHEN 5-325 MG PO TABS
1.0000 | ORAL_TABLET | ORAL | Status: DC | PRN
  Administered 2016-07-16 – 2016-07-17 (×3): 1 via ORAL
  Filled 2016-07-16 (×3): qty 1

## 2016-07-16 MED ORDER — CEFAZOLIN SODIUM-DEXTROSE 2-4 GM/100ML-% IV SOLN
2.0000 g | INTRAVENOUS | Status: AC
Start: 2016-07-16 — End: 2016-07-16
  Administered 2016-07-16: 2 g via INTRAVENOUS
  Filled 2016-07-16 (×2): qty 100

## 2016-07-16 MED ORDER — PROPOFOL 10 MG/ML IV BOLUS
INTRAVENOUS | Status: AC
  Filled 2016-07-16: qty 40

## 2016-07-16 MED ORDER — ONDANSETRON HCL 4 MG/2ML IJ SOLN: 4.0000 mg | Freq: Four times a day (QID) | INTRAMUSCULAR | Status: DC | PRN

## 2016-07-16 MED ORDER — DIPHENHYDRAMINE HCL 12.5 MG/5ML PO ELIX: 12.5000 mg | ORAL_SOLUTION | Freq: Four times a day (QID) | ORAL | Status: DC | PRN

## 2016-07-16 MED ORDER — LACTATED RINGERS IV SOLN
INTRAVENOUS | Status: DC | PRN
  Administered 2016-07-16 (×2): via INTRAVENOUS

## 2016-07-16 MED ORDER — ACETAMINOPHEN 325 MG PO TABS: 650.0000 mg | ORAL_TABLET | Freq: Four times a day (QID) | ORAL | Status: DC | PRN

## 2016-07-16 MED ORDER — METOPROLOL TARTRATE 100 MG PO TABS
200.0000 mg | ORAL_TABLET | Freq: Two times a day (BID) | ORAL | Status: DC
  Administered 2016-07-16 – 2016-07-17 (×2): 200 mg via ORAL
  Filled 2016-07-16 (×2): qty 2

## 2016-07-16 MED ORDER — FENTANYL CITRATE (PF) 100 MCG/2ML IJ SOLN
INTRAMUSCULAR | Status: AC
  Filled 2016-07-16: qty 2

## 2016-07-16 MED ORDER — ONDANSETRON HCL 4 MG/2ML IJ SOLN
INTRAMUSCULAR | Status: DC | PRN
  Administered 2016-07-16: 4 mg via INTRAVENOUS

## 2016-07-16 MED ORDER — FLUTICASONE PROPIONATE 50 MCG/ACT NA SUSP
2.0000 | Freq: Every day | NASAL | Status: DC
  Filled 2016-07-16: qty 16

## 2016-07-16 MED ORDER — IOPAMIDOL (ISOVUE-300) INJECTION 61%
INTRAVENOUS | Status: AC
  Administered 2016-07-16: 100 mL
  Filled 2016-07-16: qty 100

## 2016-07-16 MED ORDER — DIPHENHYDRAMINE HCL 50 MG/ML IJ SOLN
12.5000 mg | Freq: Four times a day (QID) | INTRAMUSCULAR | Status: DC | PRN
  Administered 2016-07-16: 12.5 mg via INTRAVENOUS
  Filled 2016-07-16: qty 1

## 2016-07-16 MED ORDER — IOPAMIDOL (ISOVUE-300) INJECTION 61%
INTRAVENOUS | Status: AC
  Filled 2016-07-16: qty 30

## 2016-07-16 MED ORDER — SODIUM CHLORIDE 0.9 % IV SOLN
INTRAVENOUS | Status: DC
  Administered 2016-07-16: 23:00:00 via INTRAVENOUS

## 2016-07-16 MED ORDER — BUPIVACAINE HCL (PF) 0.25 % IJ SOLN
INTRAMUSCULAR | Status: DC | PRN
  Administered 2016-07-16: 17 mL

## 2016-07-16 MED ORDER — ROCURONIUM BROMIDE 10 MG/ML (PF) SYRINGE
PREFILLED_SYRINGE | INTRAVENOUS | Status: DC | PRN
  Administered 2016-07-16: 50 mg via INTRAVENOUS
  Administered 2016-07-16: 10 mg via INTRAVENOUS
  Administered 2016-07-16: 5 mg via INTRAVENOUS
  Administered 2016-07-16: 10 mg via INTRAVENOUS

## 2016-07-16 MED ORDER — ESCITALOPRAM OXALATE 10 MG PO TABS
10.0000 mg | ORAL_TABLET | Freq: Every day | ORAL | Status: DC
  Administered 2016-07-16 – 2016-07-17 (×2): 10 mg via ORAL
  Filled 2016-07-16 (×2): qty 1

## 2016-07-16 MED ORDER — HYDRALAZINE HCL 20 MG/ML IJ SOLN: 10.0000 mg | INTRAMUSCULAR | Status: DC | PRN

## 2016-07-16 MED ORDER — MIDAZOLAM HCL 2 MG/2ML IJ SOLN
INTRAMUSCULAR | Status: AC
  Filled 2016-07-16: qty 2

## 2016-07-16 MED ORDER — ONDANSETRON 4 MG PO TBDP: 4.0000 mg | ORAL_TABLET | Freq: Four times a day (QID) | ORAL | Status: DC | PRN

## 2016-07-16 MED ORDER — PROPOFOL 10 MG/ML IV BOLUS
INTRAVENOUS | Status: DC | PRN
Start: 2016-07-16 — End: 2016-07-16
  Administered 2016-07-16: 200 mg via INTRAVENOUS

## 2016-07-16 MED ORDER — PANTOPRAZOLE SODIUM 40 MG IV SOLR
40.0000 mg | Freq: Every day | INTRAVENOUS | Status: DC
  Administered 2016-07-16: 40 mg via INTRAVENOUS
  Filled 2016-07-16: qty 40

## 2016-07-16 MED ORDER — MORPHINE SULFATE (PF) 4 MG/ML IV SOLN: 2.0000 mg | INTRAVENOUS | Status: DC | PRN

## 2016-07-16 MED ORDER — 0.9 % SODIUM CHLORIDE (POUR BTL) OPTIME
TOPICAL | Status: DC | PRN
  Administered 2016-07-16: 1000 mL

## 2016-07-16 MED ORDER — ACETAMINOPHEN 650 MG RE SUPP: 650.0000 mg | Freq: Four times a day (QID) | RECTAL | Status: DC | PRN

## 2016-07-16 MED ORDER — VITAMIN E 180 MG (400 UNIT) PO CAPS
400.0000 [IU] | ORAL_CAPSULE | Freq: Every day | ORAL | Status: DC
  Administered 2016-07-17: 400 [IU] via ORAL
  Filled 2016-07-16 (×2): qty 1

## 2016-07-16 MED ORDER — SUGAMMADEX SODIUM 200 MG/2ML IV SOLN
INTRAVENOUS | Status: DC | PRN
  Administered 2016-07-16: 200 mg via INTRAVENOUS

## 2016-07-16 MED ORDER — DEXTROSE 5 % IV SOLN
INTRAVENOUS | Status: DC | PRN
  Administered 2016-07-16: 25 ug/min via INTRAVENOUS

## 2016-07-16 MED ORDER — MUPIROCIN 2 % EX OINT: TOPICAL_OINTMENT | Freq: Two times a day (BID) | CUTANEOUS | Status: DC

## 2016-07-16 MED ORDER — BUPROPION HCL ER (XL) 150 MG PO TB24
150.0000 mg | ORAL_TABLET | Freq: Every day | ORAL | Status: DC
  Administered 2016-07-16 – 2016-07-17 (×2): 150 mg via ORAL
  Filled 2016-07-16 (×2): qty 1

## 2016-07-16 MED ORDER — ALLOPURINOL 300 MG PO TABS
300.0000 mg | ORAL_TABLET | Freq: Every day | ORAL | Status: DC
  Administered 2016-07-16 – 2016-07-17 (×2): 300 mg via ORAL
  Filled 2016-07-16 (×2): qty 1

## 2016-07-16 MED ORDER — FENTANYL CITRATE (PF) 100 MCG/2ML IJ SOLN
INTRAMUSCULAR | Status: DC | PRN
  Administered 2016-07-16 (×4): 50 ug via INTRAVENOUS

## 2016-07-16 MED ORDER — PHENYLEPHRINE 40 MCG/ML (10ML) SYRINGE FOR IV PUSH (FOR BLOOD PRESSURE SUPPORT)
PREFILLED_SYRINGE | INTRAVENOUS | Status: DC | PRN
  Administered 2016-07-16: 80 ug via INTRAVENOUS
  Administered 2016-07-16: 40 ug via INTRAVENOUS
  Administered 2016-07-16: 80 ug via INTRAVENOUS

## 2016-07-16 MED ORDER — FERROUS SULFATE 325 (65 FE) MG PO TABS
325.0000 mg | ORAL_TABLET | Freq: Every day | ORAL | Status: DC
  Administered 2016-07-17: 325 mg via ORAL
  Filled 2016-07-16: qty 1

## 2016-07-16 MED ORDER — CHLORHEXIDINE GLUCONATE CLOTH 2 % EX PADS
6.0000 | MEDICATED_PAD | Freq: Every day | CUTANEOUS | Status: DC
  Administered 2016-07-16: 6 via TOPICAL

## 2016-07-16 MED ORDER — LIDOCAINE 2% (20 MG/ML) 5 ML SYRINGE
INTRAMUSCULAR | Status: DC | PRN
  Administered 2016-07-16: 100 mg via INTRAVENOUS

## 2016-07-16 MED ORDER — LACTATED RINGERS IV SOLN
INTRAVENOUS | Status: DC
  Administered 2016-07-16: 10:00:00 via INTRAVENOUS

## 2016-07-16 MED ORDER — ETOMIDATE 2 MG/ML IV SOLN: 15.0000 mg | Freq: Once | INTRAVENOUS | Status: DC

## 2016-07-16 SURGICAL SUPPLY — 39 items
ADH SKN CLS APL DERMABOND .7 (GAUZE/BANDAGES/DRESSINGS) ×1
APPLIER CLIP 5 13 M/L LIGAMAX5 (MISCELLANEOUS)
APR CLP MED LRG 5 ANG JAW (MISCELLANEOUS)
BLADE CLIPPER SURG (BLADE) IMPLANT
CHLORAPREP W/TINT 26ML (MISCELLANEOUS) ×2 IMPLANT
CLIP APPLIE 5 13 M/L LIGAMAX5 (MISCELLANEOUS) IMPLANT
COVER SURGICAL LIGHT HANDLE (MISCELLANEOUS) ×2 IMPLANT
DERMABOND ADVANCED (GAUZE/BANDAGES/DRESSINGS) ×1
DERMABOND ADVANCED .7 DNX12 (GAUZE/BANDAGES/DRESSINGS) ×1 IMPLANT
DEVICE SECURE STRAP 25 ABSORB (INSTRUMENTS) ×1 IMPLANT
DISSECT BALLN SPACEMKR + OVL (BALLOONS)
DISSECTOR BALLN SPACEMKR + OVL (BALLOONS) ×1 IMPLANT
ELECT REM PT RETURN 9FT ADLT (ELECTROSURGICAL) ×2
ELECTRODE REM PT RTRN 9FT ADLT (ELECTROSURGICAL) ×1 IMPLANT
GLOVE BIO SURGEON STRL SZ7.5 (GLOVE) ×1 IMPLANT
GLOVE BIOGEL PI IND STRL 7.0 (GLOVE) ×1 IMPLANT
GLOVE BIOGEL PI IND STRL 7.5 (GLOVE) IMPLANT
GLOVE BIOGEL PI INDICATOR 7.0 (GLOVE) ×1
GLOVE BIOGEL PI INDICATOR 7.5 (GLOVE) ×1
GLOVE SURG SS PI 7.0 STRL IVOR (GLOVE) ×2 IMPLANT
GOWN STRL REUS W/ TWL LRG LVL3 (GOWN DISPOSABLE) ×3 IMPLANT
GOWN STRL REUS W/TWL LRG LVL3 (GOWN DISPOSABLE) ×6
KIT BASIN OR (CUSTOM PROCEDURE TRAY) ×2 IMPLANT
KIT ROOM TURNOVER OR (KITS) ×2 IMPLANT
MESH 3DMAX 5X7 LT XLRG (Mesh General) ×1 IMPLANT
NS IRRIG 1000ML POUR BTL (IV SOLUTION) ×2 IMPLANT
PACK LAPAROSCOPY I 1258 (SET/KITS/TRAYS/PACK) ×2 IMPLANT
PAD ARMBOARD 7.5X6 YLW CONV (MISCELLANEOUS) ×2 IMPLANT
SCISSORS LAP 5X35 DISP (ENDOMECHANICALS) ×1 IMPLANT
SET IRRIG TUBING LAPAROSCOPIC (IRRIGATION / IRRIGATOR) ×1 IMPLANT
SHEARS HARMONIC ACE PLUS 36CM (ENDOMECHANICALS) ×1 IMPLANT
SUT MNCRL AB 4-0 PS2 18 (SUTURE) ×2 IMPLANT
SUT VIC AB 3-0 SH 27 (SUTURE) ×2
SUT VIC AB 3-0 SH 27X BRD (SUTURE) IMPLANT
TOWEL OR 17X24 6PK STRL BLUE (TOWEL DISPOSABLE) ×2 IMPLANT
TOWEL OR 17X26 10 PK STRL BLUE (TOWEL DISPOSABLE) ×1 IMPLANT
TRAY FOLEY CATH SILVER 16FR (SET/KITS/TRAYS/PACK) ×1 IMPLANT
TROCAR XCEL BLADELESS 5X75MML (TROCAR) ×4 IMPLANT
TUBING INSUFFLATION (TUBING) ×2 IMPLANT

## 2016-07-16 NOTE — ED Triage Notes (Signed)
Pt. reports constipation for 2 days and left groin pain with swelling , denies emesis or fever , pt. suspects left inguinal hernia is worsening .

## 2016-07-16 NOTE — ED Notes (Signed)
Patient transported to CT SCAN . 

## 2016-07-16 NOTE — ED Provider Notes (Signed)
[facility]-EMERGENCY DEPT Provider Note   CSN: [id number] Arrival date & time: 07/16/16  0444     History   Chief Complaint Chief Complaint  Patient presents with  . Constipation  . Inguinal Hernia    HPI Ralph Sullivan is a 71 y.o. male.  Patient with history of atrial fibrillation on chronic anticoagulation (coumadin), CKD Stage 3, obesity, CVA presents with complaint of pain and swelling of known hernia in the left inguinal area over the last 2 days. He states the hernia protrudes while he is ambulatory and usually reduces when he lies down. In the last 2 days the hernia does not reduce and has become hard and painful. He reports his last bowel movement was 2 days ago. He is passing very little gas. No vomiting but he has had nausea. No fever.    The history is provided by the patient. No language interpreter was used.  Constipation   Associated symptoms include abdominal pain (See HPI.).    Past Medical History:  Diagnosis Date  . Atrial fibrillation (HCC) 02/08/2013   CHADS-VAS - 2 (AGE, HTN). On anticoagulation. Holter 6/14 - 2.3 sec pause. Avg HR 87. Rate control   . Bifascicular block 03/28/2013  . Bilateral congenital hammer toes   . Chronic anticoagulation 03/28/2013  . Chronic kidney disease, stage III (moderate) 03/28/2013  . 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 Gout   . Hyperhidrosis 03/28/2013  . Obesity, unspecified 03/28/2013  . Rhabdomyolysis 09/2015  . Stroke (cerebrum) [facility])    on MRI 04/15/2014 [place]    Patient Active Problem List   Diagnosis Date Noted  . Rhabdomyolysis 10/14/2015  . Acute renal failure (ARF) (HCC) 10/14/2015  . Anemia 10/14/2015  . UTI (lower urinary tract infection) 10/14/2015  . Atrial fibrillation with rapid ventricular response (HCC) 10/14/2015  . Elevated CK   . Encounter for therapeutic drug monitoring 08/29/2014  . Obesity 03/28/2013  . HTN (hypertension) 03/28/2013    . Chronic kidney disease, stage III (moderate) 03/28/2013  . Chronic anticoagulation 03/28/2013  . Hyperhidrosis 03/28/2013  . Dilated aortic root (HCC) 03/28/2013  . Bifascicular block 03/28/2013  . Persistent atrial fibrillation (HCC) 02/08/2013    Past Surgical History:  Procedure Laterality Date  . CATARACT EXTRACTION    . HERNIA REPAIR    . KIDNEY STONE SURGERY         Home Medications    Prior to Admission medications   Medication Sig Start Date End Date Taking? Authorizing Provider  allopurinol (ZYLOPRIM) 300 MG tablet Take 300 mg by mouth daily.  06/25/16  Yes Historical Provider, MD  buPROPion (WELLBUTRIN XL) 150 MG 24 hr tablet Take 150 mg by mouth daily.   Yes Historical Provider, MD  CHROMIUM-CINNAMON PO Take 200 mg by mouth at bedtime.    Yes Historical Provider, MD  cloNIDine (CATAPRES) 0.2 MG tablet Take 0.2 mg by mouth 2 (two) times daily.  03/08/13  Yes Historical Provider, MD  escitalopram (LEXAPRO) 10 MG tablet Take 10 mg by mouth daily.   Yes Historical Provider, MD  ferrous sulfate 325 (65 FE) MG tablet Take 325 mg by mouth daily with breakfast.   Yes Historical Provider, MD  fluticasone (FLONASE) 50 MCG/ACT nasal spray Place 2 sprays into both nostrils daily.  09/28/14  Yes Historical Provider, MD  metoprolol (LOPRESSOR) 100 MG tablet Take 200 mg by mouth 2 (two) times daily.  01/05/13  Yes Historical Provider, MD  Omega-3 Fatty Acids (OMEGA 3 PO) Take 1 capsule by mouth 2 (two) times daily.    Yes Historical Provider, MD  vitamin E 400 UNIT capsule Take 400 Units by mouth daily.   Yes Historical Provider, MD  warfarin (COUMADIN) 5 MG tablet Take 2.5 mg by mouth daily.   Yes Historical Provider, MD  warfarin (COUMADIN) 5 MG tablet TAKE AS DIRECTED BY  COUMADIN  CLINIC. Patient not taking: Reported on 07/16/2016 02/13/16   Jake BatheMark C Skains, MD    Family History Family History  Problem Relation Age of Onset  . Hypertension Mother   . Diabetes Mother     Social  History Social History  Substance Use Topics  . Smoking status: Former Smoker    Quit date: 03/28/1945  . Smokeless tobacco: Never Used  . Alcohol use No     Allergies   Duloxetine and Sulfur   Review of Systems Review of Systems  Constitutional: Negative for chills and fever.  Respiratory: Negative.   Cardiovascular: Negative.   Gastrointestinal: Positive for abdominal pain (See HPI.) and constipation.  Genitourinary: Negative for testicular pain.  Musculoskeletal: Negative.   Skin: Negative.   Neurological: Negative.      Physical Exam Updated Vital Signs BP (!) 148/65 (BP Location: Right Arm)   Pulse 86   Temp 97.5 F (36.4 C) (Oral)   Resp 16   SpO2 99%   Physical Exam  Constitutional: He is oriented to person, place, and time. He appears well-developed and well-nourished.  HENT:  Head: Normocephalic.  Neck: Normal range of motion. Neck supple.  Cardiovascular: Normal rate and regular rhythm.   Pulmonary/Chest: Effort normal and breath sounds normal.  Abdominal: Soft. Bowel sounds are normal. There is no tenderness. There is no rebound and no guarding.  Genitourinary:  Genitourinary Comments: Large, hard left inguinal hernia that does not reduce with steady pressure. The area is only mildly tender. No significant scrotal swelling.  Musculoskeletal: Normal range of motion.  Neurological: He is alert and oriented to person, place, and time.  Skin: Skin is warm and dry. No rash noted.  Psychiatric: He has a normal mood and affect.     ED Treatments / Results  Labs (all labs ordered are listed, but only abnormal results are displayed) Labs Reviewed  CBC WITH DIFFERENTIAL/PLATELET - Abnormal; Notable for the following:       Result Value   RBC 4.17 (*)    Hemoglobin 10.4 (*)    HCT 34.5 (*)    MCH 24.9 (*)    RDW 17.6 (*)    All other components within normal limits  COMPREHENSIVE METABOLIC PANEL - Abnormal; Notable for the following:    Chloride 97 (*)     Creatinine, Ser 1.66 (*)    Total Protein 5.8 (*)    Albumin 2.6 (*)    ALT 12 (*)    GFR calc non Af Amer 41 (*)    GFR calc Af Amer 47 (*)    All other components within normal limits  PROTIME-INR   Results for orders placed or performed during the hospital encounter of 07/16/16  CBC with Differential  Result Value Ref Range   WBC 10.1 4.0 - 10.5 K/uL   RBC 4.17 (L) 4.22 - 5.81 MIL/uL   Hemoglobin 10.4 (L) 13.0 - 17.0 g/dL   HCT 91.434.5 (L) 78.239.0 - 95.652.0 %   MCV 82.7 78.0 - 100.0 fL   MCH 24.9 (L) 26.0 - 34.0 pg   MCHC  30.1 30.0 - 36.0 g/dL   RDW 16.1 (H) 09.6 - 04.5 %   Platelets 214 150 - 400 K/uL   Neutrophils Relative % 64 %   Neutro Abs 6.4 1.7 - 7.7 K/uL   Lymphocytes Relative 23 %   Lymphs Abs 2.3 0.7 - 4.0 K/uL   Monocytes Relative 9 %   Monocytes Absolute 1.0 0.1 - 1.0 K/uL   Eosinophils Relative 3 %   Eosinophils Absolute 0.3 0.0 - 0.7 K/uL   Basophils Relative 1 %   Basophils Absolute 0.1 0.0 - 0.1 K/uL  Comprehensive metabolic panel  Result Value Ref Range   Sodium 136 135 - 145 mmol/L   Potassium 4.1 3.5 - 5.1 mmol/L   Chloride 97 (L) 101 - 111 mmol/L   CO2 28 22 - 32 mmol/L   Glucose, Bld 98 65 - 99 mg/dL   BUN 18 6 - 20 mg/dL   Creatinine, Ser 4.09 (H) 0.61 - 1.24 mg/dL   Calcium 9.4 8.9 - 81.1 mg/dL   Total Protein 5.8 (L) 6.5 - 8.1 g/dL   Albumin 2.6 (L) 3.5 - 5.0 g/dL   AST 27 15 - 41 U/L   ALT 12 (L) 17 - 63 U/L   Alkaline Phosphatase 101 38 - 126 U/L   Total Bilirubin 0.6 0.3 - 1.2 mg/dL   GFR calc non Af Amer 41 (L) >60 mL/min   GFR calc Af Amer 47 (L) >60 mL/min   Anion gap 11 5 - 15  Protime-INR  Result Value Ref Range   Prothrombin Time 18.2 (H) 11.4 - 15.2 seconds   INR 1.50      EKG  EKG Interpretation None       Radiology No results found. Ct Abdomen Pelvis W Contrast  Result Date: 07/16/2016 CLINICAL DATA:  71 year old male with constipation and left groin pain and swelling. EXAM: CT ABDOMEN AND PELVIS WITH CONTRAST  TECHNIQUE: Multidetector CT imaging of the abdomen and pelvis was performed using the standard protocol following bolus administration of intravenous contrast. CONTRAST:  ISOVUE-300 IOPAMIDOL (ISOVUE-300) INJECTION 61% COMPARISON:  None. FINDINGS: Lower chest: The visualized lung bases are clear. There is dense coronary vascular calcification. No intra-abdominal free air.  Small free fluid within the pelvis. Hepatobiliary: The liver is unremarkable. No intrahepatic biliary ductal dilatation. The gallbladder is unremarkable as well. Pancreas: Unremarkable. No pancreatic ductal dilatation or surrounding inflammatory changes. Spleen: Normal in size without focal abnormality. Adrenals/Urinary Tract: The adrenal glands are unremarkable. There is severe bilateral renal parenchymal atrophy and cortical thinning and irregularity. Multiple small nonobstructing bilateral renal calculi measure up to 5 mm in the upper pole of the right kidney. There are multiple bilateral renal hypodense lesions the larger cyst represent cysts and the smaller ones are too small to characterize but most likely represent cysts. There is no hydronephrosis on either side. The visualized ureters appear unremarkable. There is partial herniation of the urinary bladder into the left inguinal canal. Stomach/Bowel: There is a large left inguinal hernia containing the sigmoid colon and portion of the urinary bladder. There is stranding of the herniated fat with mild inflammatory changes of the herniated sigmoid colon. There is narrowing of the segments of the sigmoid colon at the hernia neck with high-grade narrowing of the exiting (efferent) limb. Small amount of free air noted in the hernia sac. There is associated degree of obstruction with backing up of the fecal content in the colon proximal to the sigmoid. Oral contrast opacifies the stomach  and multiple loops of small bowel. There is no evidence of small-bowel obstruction. A 15 mm radiopaque  tablet within the cecum likely an ingested pill. Normal appendix. Vascular/Lymphatic: There is moderate aortoiliac atherosclerotic disease. The aorta is mildly tortuous. The origins of the celiac axis, SMA, IMA as well as the origins of the renal arteries are patent. The SMV, splenic vein, and main portal vein are patent. No portal venous gas identified. There is no adenopathy. Reproductive: The prostate and seminal vesicles are grossly unremarkable. Other: Small fat containing umbilical hernia. Musculoskeletal: Osteopenia with mild scoliosis and degenerative changes of the spine. Extensive multilevel disc desiccation with vacuum phenomena. Subacute/chronic left femoral intertrochanteric fracture status post internal fixation. There is incomplete healing of the fracture fragments at this time. Correlation with clinical exam and follow-up recommended. No definite acute fracture. IMPRESSION: 1. Large left inguinal hernia containing the sigmoid colon and portion of the bladder. There is narrowing of the herniated sigmoid colon at the hernia neck with obstruction of the exiting loop. There is associated edema with small amount of fluid in the hernia. Correlation with clinical exam and surgical consult is advised. 2. Atrophic kidneys with multiple small cysts. Small nonobstructing bilateral renal calculi. No hydronephrosis. 3. Comminuted left femoral neck fracture status post prior internal fixation. There is incomplete healing of the fracture at this time. Follow-up recommended. 4. Moderate aortoiliac atherosclerotic disease. Electronically Signed   By: Elgie Collard M.D.   On: 07/16/2016 06:52   Procedures Procedures (including critical care time)  Medications Ordered in ED Medications  iopamidol (ISOVUE-300) 61 % injection (not administered)  iopamidol (ISOVUE-300) 61 % injection (100 mLs  Contrast Given 07/16/16 0618)     Initial Impression / Assessment and Plan / ED Course  I have reviewed the triage  vital signs and the nursing notes.  Pertinent labs & imaging results that were available during my care of the patient were reviewed by me and considered in my medical decision making (see chart for details).     Patient presents with large inguinal hernia, unable to be reduced. CT correlates with incarcerated hernia. Dr. Patria Mane to attempt reduction. The patient is in no acute distress. VSS.   Patient care signed out to Dr. Patria Mane for final disposition.  Final Clinical Impressions(s) / ED Diagnoses   Final diagnoses:  None   1. Left inguinal hernia.  New Prescriptions New Prescriptions   No medications on file     Elpidio Anis, PA-C 07/21/16 1446    Azalia Bilis, MD 07/23/16 [phone]

## 2016-07-16 NOTE — Progress Notes (Signed)
Arrived to room 6n20 from PACU, family at bedside,denies pain/nausea at this time, oriented to room and surroundings

## 2016-07-16 NOTE — Anesthesia Preprocedure Evaluation (Signed)
Anesthesia Evaluation  Patient identified by MRN, date of birth, ID band Patient awake    Reviewed: Allergy & Precautions, NPO status , Patient's Chart, lab work & pertinent test results  Airway Mallampati: II  TM Distance: >3 FB     Dental   Pulmonary former smoker,    breath sounds clear to auscultation       Cardiovascular hypertension, + Peripheral Vascular Disease  + pacemaker  Rhythm:Regular Rate:Normal     Neuro/Psych    GI/Hepatic negative GI ROS,   Endo/Other    Renal/GU Renal disease     Musculoskeletal   Abdominal   Peds  Hematology  (+) anemia ,   Anesthesia Other Findings   Reproductive/Obstetrics                             Anesthesia Physical Anesthesia Plan  ASA: III  Anesthesia Plan: General   Post-op Pain Management:    Induction: Intravenous  Airway Management Planned: Oral ETT  Additional Equipment:   Intra-op Plan:   Post-operative Plan:   Informed Consent: I have reviewed the patients History and Physical, chart, labs and discussed the procedure including the risks, benefits and alternatives for the proposed anesthesia with the patient or authorized representative who has indicated his/her understanding and acceptance.   Dental advisory given  Plan Discussed with: CRNA and Anesthesiologist  Anesthesia Plan Comments:         Anesthesia Quick Evaluation

## 2016-07-16 NOTE — H&P (Signed)
[facility] Consult/Admission Note  Ralph Sullivan June 17, 1945  [id number].    Requesting MD: Dr. Patria Mane Chief Complaint/Reason for Consult: left inguinal hernia  HPI:   Pt is a 71 year old male with a history of A. fib on heparin, bifascicular block, CK D stage III, gout, stroke 2015, previous left inguinal hernia repair who presented to the ED with complaints of left inguinal hernia. Patient states he has had this for roughly 5 years. It was previously repaired. Patient states he has always been able to push it back in except for the last 2-3 days he has been unable to reduce this hernia. Associated pain with touch or having a bowel movement. Pain is severe at times, nonradiating, not moving makes it better. Associated nausea and intermittent gagging. Last BM was this morning. Patient states there has been blood on his stool and on the tissue paper last 3 days. Patient has a history of hemorrhoids. Patient denies vomiting, chest pain, shortness of breath, diarrhea, fever, chills.   ED course: BP 139/112 Labs: Creatinine 1.66, total protein 5.8, GFR 41, Hg 10.4, prothrombin time 18.2, INR 1.8 CT: Large left renal hernia containing the sigmoid colon and a portion of the bladder.  ROS:  Review of Systems  Constitutional: Negative for chills, diaphoresis and fever.  HENT: Negative for congestion and sore throat.   Eyes: Negative for pain and discharge.  Respiratory: Negative for cough and shortness of breath.   Cardiovascular: Negative for chest pain and palpitations.  Gastrointestinal: Positive for abdominal pain, blood in stool, constipation and nausea. Negative for diarrhea and vomiting.  Genitourinary: Negative for dysuria and hematuria.       Left inguinal hernia  Skin: Negative for itching and rash.  Neurological: Negative for dizziness, loss of consciousness and headaches.     Family History  Problem Relation Age of Onset  . Hypertension Mother   . Diabetes Mother      Past Medical History:  Diagnosis Date  . Atrial fibrillation (HCC) 02/08/2013   CHADS-VAS - 2 (AGE, HTN). On anticoagulation. Holter 6/14 - 2.3 sec pause. Avg HR 87. Rate control   . Bifascicular block 03/28/2013  . Bilateral congenital hammer toes   . Chronic anticoagulation 03/28/2013  . Chronic kidney disease, stage III (moderate) 03/28/2013  . 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 Gout   . Hyperhidrosis 03/28/2013  . Obesity, unspecified 03/28/2013  . Rhabdomyolysis 09/2015  . Stroke (cerebrum) [facility])    on MRI 04/15/2014 Lacunar    Past Surgical History:  Procedure Laterality Date  . CATARACT EXTRACTION    . HERNIA REPAIR    . KIDNEY STONE SURGERY      Social History:  reports that he quit smoking about 71 years ago. He has never used smokeless tobacco. He reports that he does not drink alcohol or use drugs.  Allergies:  Allergies  Allergen Reactions  . Duloxetine Other (See Comments)    More nervous  . Sulfur Nausea And Vomiting     (Not in a hospital admission)  Blood pressure (!) 139/112, pulse 97, temperature 97.5 F (36.4 C), temperature source Oral, resp. rate 16, SpO2 100 %.  Physical Exam  Constitutional: He is oriented to person, place, and time and well-developed, well-nourished, and in no distress. No distress.  Well appearing elderly man  HENT:  Head: Normocephalic and atraumatic.  Nose: Nose normal.  Eyes: Conjunctivae are normal. Right eye exhibits  no discharge. Left eye exhibits no discharge. No scleral icterus.  Neck: Normal range of motion. Neck supple.  Cardiovascular: S1 normal and normal heart sounds.  An irregularly irregular rhythm present.  No murmur heard. Pulses:      Radial pulses are 2+ on the right side, and 2+ on the left side.       Dorsalis pedis pulses are 2+ on the right side, and 2+ on the left side.  Pulmonary/Chest: Effort normal and breath sounds normal. He has  no wheezes. He has no rhonchi. He has no rales.  Abdominal: Soft. Normal appearance and bowel sounds are normal. He exhibits no distension. There is no tenderness. There is no rigidity, no rebound and no guarding. A hernia is present. Hernia confirmed positive in the left inguinal area.  Large left inguinal hernia that is TTP and not reducible, no erythema noted  Musculoskeletal: Normal range of motion. He exhibits edema (mild pitting edema to BLE).  Neurological: He is alert and oriented to person, place, and time.  Skin: Skin is warm and dry. No rash noted. He is not diaphoretic.  Psychiatric: Mood and affect normal.  Nursing note and vitals reviewed.   Results for orders placed or performed during the hospital encounter of 07/16/16 (from the past 48 hour(s))  CBC with Differential     Status: Abnormal   Collection Time: 07/16/16  5:07 AM  Result Value Ref Range   WBC 10.1 4.0 - 10.5 K/uL   RBC 4.17 (L) 4.22 - 5.81 MIL/uL   Hemoglobin 10.4 (L) 13.0 - 17.0 g/dL   HCT 34.5 (L) 39.0 - 52.0 %   MCV 82.7 78.0 - 100.0 fL   MCH 24.9 (L) 26.0 - 34.0 pg   MCHC 30.1 30.0 - 36.0 g/dL   RDW 17.6 (H) 11.5 - 15.5 %   Platelets 214 150 - 400 K/uL   Neutrophils Relative % 64 %   Neutro Abs 6.4 1.7 - 7.7 K/uL   Lymphocytes Relative 23 %   Lymphs Abs 2.3 0.7 - 4.0 K/uL   Monocytes Relative 9 %   Monocytes Absolute 1.0 0.1 - 1.0 K/uL   Eosinophils Relative 3 %   Eosinophils Absolute 0.3 0.0 - 0.7 K/uL   Basophils Relative 1 %   Basophils Absolute 0.1 0.0 - 0.1 K/uL  Comprehensive metabolic panel     Status: Abnormal   Collection Time: 07/16/16  5:07 AM  Result Value Ref Range   Sodium 136 135 - 145 mmol/L   Potassium 4.1 3.5 - 5.1 mmol/L   Chloride 97 (L) 101 - 111 mmol/L   CO2 28 22 - 32 mmol/L   Glucose, Bld 98 65 - 99 mg/dL   BUN 18 6 - 20 mg/dL   Creatinine, Ser 1.66 (H) 0.61 - 1.24 mg/dL   Calcium 9.4 8.9 - 10.3 mg/dL   Total Protein 5.8 (L) 6.5 - 8.1 g/dL   Albumin 2.6 (L) 3.5 - 5.0  g/dL   AST 27 15 - 41 U/L   ALT 12 (L) 17 - 63 U/L   Alkaline Phosphatase 101 38 - 126 U/L   Total Bilirubin 0.6 0.3 - 1.2 mg/dL   GFR calc non Af Amer 41 (L) >60 mL/min   GFR calc Af Amer 47 (L) >60 mL/min    Comment: (NOTE) The eGFR has been calculated using the CKD EPI equation. This calculation has not been validated in all clinical situations. eGFR's persistently <60 mL/min signify possible Chronic Kidney Disease.  Anion gap 11 5 - 15  Protime-INR     Status: Abnormal   Collection Time: 07/16/16  5:51 AM  Result Value Ref Range   Prothrombin Time 18.2 (H) 11.4 - 15.2 seconds   INR 1.50    Ct Abdomen Pelvis W Contrast  Result Date: 07/16/2016 CLINICAL DATA:  71 year old male with constipation and left groin pain and swelling. EXAM: CT ABDOMEN AND PELVIS WITH CONTRAST TECHNIQUE: Multidetector CT imaging of the abdomen and pelvis was performed using the standard protocol following bolus administration of intravenous contrast. CONTRAST:  113m ISOVUE-300 IOPAMIDOL (ISOVUE-300) INJECTION 61% COMPARISON:  None. FINDINGS: Lower chest: The visualized lung bases are clear. There is dense coronary vascular calcification. No intra-abdominal free air.  Small free fluid within the pelvis. Hepatobiliary: The liver is unremarkable. No intrahepatic biliary ductal dilatation. The gallbladder is unremarkable as well. Pancreas: Unremarkable. No pancreatic ductal dilatation or surrounding inflammatory changes. Spleen: Normal in size without focal abnormality. Adrenals/Urinary Tract: The adrenal glands are unremarkable. There is severe bilateral renal parenchymal atrophy and cortical thinning and irregularity. Multiple small nonobstructing bilateral renal calculi measure up to 5 mm in the upper pole of the right kidney. There are multiple bilateral renal hypodense lesions the larger cyst represent cysts and the smaller ones are too small to characterize but most likely represent cysts. There is no  hydronephrosis on either side. The visualized ureters appear unremarkable. There is partial herniation of the urinary bladder into the left inguinal canal. Stomach/Bowel: There is a large left inguinal hernia containing the sigmoid colon and portion of the urinary bladder. There is stranding of the herniated fat with mild inflammatory changes of the herniated sigmoid colon. There is narrowing of the segments of the sigmoid colon at the hernia neck with high-grade narrowing of the exiting (efferent) limb. Small amount of free air noted in the hernia sac. There is associated degree of obstruction with backing up of the fecal content in the colon proximal to the sigmoid. Oral contrast opacifies the stomach and multiple loops of small bowel. There is no evidence of small-bowel obstruction. A 15 mm radiopaque tablet within the cecum likely an ingested pill. Normal appendix. Vascular/Lymphatic: There is moderate aortoiliac atherosclerotic disease. The aorta is mildly tortuous. The origins of the celiac axis, SMA, IMA as well as the origins of the renal arteries are patent. The SMV, splenic vein, and main portal vein are patent. No portal venous gas identified. There is no adenopathy. Reproductive: The prostate and seminal vesicles are grossly unremarkable. Other: Small fat containing umbilical hernia. Musculoskeletal: Osteopenia with mild scoliosis and degenerative changes of the spine. Extensive multilevel disc desiccation with vacuum phenomena. Subacute/chronic left femoral intertrochanteric fracture status post internal fixation. There is incomplete healing of the fracture fragments at this time. Correlation with clinical exam and follow-up recommended. No definite acute fracture. IMPRESSION: 1. Large left inguinal hernia containing the sigmoid colon and portion of the bladder. There is narrowing of the herniated sigmoid colon at the hernia neck with obstruction of the exiting loop. There is associated edema with small  amount of fluid in the hernia. Correlation with clinical exam and surgical consult is advised. 2. Atrophic kidneys with multiple small cysts. Small nonobstructing bilateral renal calculi. No hydronephrosis. 3. Comminuted left femoral neck fracture status post prior internal fixation. There is incomplete healing of the fracture at this time. Follow-up recommended. 4. Moderate aortoiliac atherosclerotic disease. Electronically Signed   By: AAnner CreteM.D.   On: 07/16/2016 06:52  Assessment/Plan  CKD stage III A. Fib on coumadin - hold coumadin  Bifascicular block HTN - hydralazine PRN  Left inguinal hernia containing large bowel and bladder  Will admit pt to our service. Patient will go to the OR this afternoon for likely laparoscopic hernia repair possibly open hernia repair. Dr. Kieth Brightly has seen the patient and patient agrees to surgery.    Kalman Drape, [facility] 07/16/2016, 7:59 AM Pager: [phone] Consults: [phone] Mon-Fri 7:00 am-4:30 pm Sat-Sun 7:00 am-11:30 am

## 2016-07-16 NOTE — Anesthesia Procedure Notes (Signed)
Procedure Name: Intubation Date/Time: 07/16/2016 11:13 AM Performed by: Annabelle HarmanSMITH, Sharee Sturdy A Pre-anesthesia Checklist: Patient identified, Emergency Drugs available, Suction available and Patient being monitored Patient Re-evaluated:Patient Re-evaluated prior to inductionOxygen Delivery Method: Circle system utilized Preoxygenation: Pre-oxygenation with 100% oxygen Intubation Type: IV induction Ventilation: Mask ventilation without difficulty Laryngoscope Size: Miller and 2 Grade View: Grade I Tube type: Oral Tube size: 8.0 mm Number of attempts: 1 Airway Equipment and Method: Stylet Placement Confirmation: ETT inserted through vocal cords under direct vision,  positive ETCO2 and breath sounds checked- equal and bilateral Secured at: 24 cm Tube secured with: Tape Dental Injury: Teeth and Oropharynx as per pre-operative assessment

## 2016-07-16 NOTE — Anesthesia Postprocedure Evaluation (Signed)
Anesthesia Post Note  Patient: Ralph Sullivan  Procedure(s) Performed: Procedure(s) (LRB): LAPAROSCOPIC INGUINAL HERNIA REPAIR (Left) INSERTION OF MESH (Left)  Patient location during evaluation: PACU Anesthesia Type: General Level of consciousness: awake Pain management: pain level controlled Vital Signs Assessment: post-procedure vital signs reviewed and stable Respiratory status: spontaneous breathing Cardiovascular status: stable Anesthetic complications: no       Last Vitals:  Vitals:   07/16/16 1300 07/16/16 1315  BP: (!) 145/109 (!) 153/104  Pulse: 81 91  Resp: (!) 22 15  Temp:      Last Pain:  Vitals:   07/16/16 1315  TempSrc:   PainSc: 8                  Estelle Skibicki

## 2016-07-16 NOTE — Op Note (Signed)
Preop diagnosis: left incarcerated recurrent inguinal hernia  Postop diagnosis: same inguinal hernia  Procedure: laparoscopic Left incarcerated recurrent inguinal hernia repair with mesh  Surgeon: Ralph RossettiLuke Alfonso Sullivan, M.D.  Asst: Velna HatchetSheila RNFA  Anesthesia: Gen.   Indications for procedure: Ralph Sullivan is a 71 y.o. male with symptoms of pain and enlarging Left inguinal hernia(s). After discussing risks, alternatives and benefits he decided on laparoscopic repair and was brought to day surgery for repair.  Description of procedure: The patient was brought into the operative suite, placed supine. Anesthesia was administered with endotracheal tube. Patient was strapped in place. The patient was prepped and draped in the usual sterile fashion.  Next quarter percent Marcaine was injected to the Right of the umbilicus, and a transverse 2 cm incision was made. A 12mm trocar was used to gain access to the peritoneal space by optical entry.  On initial visualization , There was a large direct inguinal hernia with colon stuck in the hernia. With gentle traction the entire sigmoid colon was reduced. The hernia opening was about 5cm in diameter. Next, a peritoneum was incised superiorly and a plan was created in the preperitoneal space. Next a began our dissection identifying the ASIS laterally and then working back medially removing the filmy tissue and adhesions of the peritoneum to the abdominal wall. Hernia sac was completely dissected out of the medial space. Vas deference and contents of the cord were safely identified. There was some adhesions from previous open repair and the mesh was identified on the medial access.   The dissected sigmoid was reexamined and one area of peritoneal tear was sutured closed with a 3-0 vicryl. No injury to the colon was seen.  A 3D max light weight mesh was inserted and tacked medially to the lacunar ligament and laterally to the anterior abdominal wall. The mesh was  positioned flat and directly up against the direct and indirect areas. The peritoneal flap was re-approximated with secure strap tacks and the sac was also apposed to the abdominal wall. The anterior rectus fascia was closed with 0 vicryl in interrupted sutures and all skin incisions were closed with 4-0 monocryl subcu stitch. The patient awoke from anesthesia and was brought to PACU in stable condition.  Findings: 5cm left direct recurrent incarcerated inguinal hernia initially containing the sigmoid colon, identification of the medial mesh edge just lateral to the direct hernia  Specimen: none  Blood loss: 50 ml  Local anesthesia: 20ml 0.25% marcaine  Complications: none  Implant: extra large left Bard 3D max mesh  Ralph Sullivan, M.D. General, Bariatric, & Minimally Invasive Surgery [facility], [place] 12:39 PM 07/16/2016

## 2016-07-16 NOTE — ED Notes (Signed)
ED Provider at bedside. 

## 2016-07-16 NOTE — Transfer of Care (Signed)
Immediate Anesthesia Transfer of Care Note  Patient: Ralph Sullivan  Procedure(s) Performed: Procedure(s): LAPAROSCOPIC INGUINAL HERNIA REPAIR (Left) INSERTION OF MESH (Left)  Patient Location: PACU  Anesthesia Type:General  Level of Consciousness: awake, alert , oriented and patient cooperative  Airway & Oxygen Therapy: Patient Spontanous Breathing and Patient connected to nasal cannula oxygen  Post-op Assessment: Report given to RN and Post -op Vital signs reviewed and stable  Post vital signs: Reviewed and stable  Last Vitals:  Vitals:   07/16/16 0913 07/16/16 0931  BP: (!) 135/96 (!) 147/110  Pulse: 95 88  Resp: 14 16  Temp:  36.6 C    Last Pain:  Vitals:   07/16/16 0931  TempSrc: Oral  PainSc:          Complications: No apparent anesthesia complications

## 2016-07-16 NOTE — ED Notes (Signed)
Surgeon at bedside.  

## 2016-07-16 NOTE — ED Notes (Signed)
Dr. Haywood LassoK. Campos at bedside explaining tests results and plan of care to pt.

## 2016-07-17 ENCOUNTER — Encounter (HOSPITAL_COMMUNITY): Payer: Self-pay | Admitting: General Surgery

## 2016-07-17 DIAGNOSIS — K4031 Unilateral inguinal hernia, with obstruction, without gangrene, recurrent: Secondary | ICD-10-CM | POA: Diagnosis not present

## 2016-07-17 LAB — BASIC METABOLIC PANEL
Anion gap: 8 (ref 5–15)
BUN: 14 mg/dL (ref 6–20)
CO2: 28 mmol/L (ref 22–32)
Calcium: 8.3 mg/dL — ABNORMAL LOW (ref 8.9–10.3)
Chloride: 99 mmol/L — ABNORMAL LOW (ref 101–111)
Creatinine, Ser: 1.59 mg/dL — ABNORMAL HIGH (ref 0.61–1.24)
GFR calc Af Amer: 50 mL/min — ABNORMAL LOW (ref >60)
GFR calc non Af Amer: 43 mL/min — ABNORMAL LOW (ref >60)
GLUCOSE: 82 mg/dL (ref 65–99)
POTASSIUM: 3.9 mmol/L (ref 3.5–5.1)
Sodium: 135 mmol/L (ref 135–145)

## 2016-07-17 LAB — CBC
HCT: 29.1 % — ABNORMAL LOW (ref 39.0–52.0)
HEMOGLOBIN: 8.5 g/dL — AB (ref 13.0–17.0)
MCH: 24.6 pg — ABNORMAL LOW (ref 26.0–34.0)
MCHC: 29.2 g/dL — ABNORMAL LOW (ref 30.0–36.0)
MCV: 84.3 fL (ref 78.0–100.0)
Platelets: 152 10*3/uL (ref 150–400)
RBC: 3.45 MIL/uL — AB (ref 4.22–5.81)
RDW: 18 % — ABNORMAL HIGH (ref 11.5–15.5)
WBC: 7.9 10*3/uL (ref 4.0–10.5)

## 2016-07-17 MED ORDER — PANTOPRAZOLE SODIUM 40 MG PO TBEC: 40.0000 mg | DELAYED_RELEASE_TABLET | Freq: Every day | ORAL | Status: DC

## 2016-07-17 MED ORDER — HYDROCODONE-ACETAMINOPHEN 5-325 MG PO TABS: 1.0000 | ORAL_TABLET | ORAL | 0 refills | Status: DC | PRN

## 2016-07-17 NOTE — Discharge Summary (Signed)
[facility] Discharge Summary   Patient ID: Ralph Sullivan MRN: [id number] DOB/AGE: 08/12/1945 71 y.o.  Admit date: 07/16/2016 Discharge date: 07/17/2016  Admitting Diagnosis: Left inguinal hernia  Discharge Diagnosis Patient Active Problem List   Diagnosis Date Noted  . Left inguinal hernia 07/16/2016  . Rhabdomyolysis 10/14/2015  . Acute renal failure (ARF) (HCC) 10/14/2015  . Anemia 10/14/2015  . UTI (lower urinary tract infection) 10/14/2015  . Atrial fibrillation with rapid ventricular response (HCC) 10/14/2015  . Elevated CK   . Encounter for therapeutic drug monitoring 08/29/2014  . Obesity 03/28/2013  . HTN (hypertension) 03/28/2013  . Chronic kidney disease, stage III (moderate) 03/28/2013  . Chronic anticoagulation 03/28/2013  . Hyperhidrosis 03/28/2013  . Dilated aortic root (HCC) 03/28/2013  . Bifascicular block 03/28/2013  . Persistent atrial fibrillation (HCC) 02/08/2013    Consultants None  Imaging: Ct Abdomen Pelvis W Contrast  Result Date: 07/16/2016 CLINICAL DATA:  71 year old male with constipation and left groin pain and swelling. EXAM: CT ABDOMEN AND PELVIS WITH CONTRAST TECHNIQUE: Multidetector CT imaging of the abdomen and pelvis was performed using the standard protocol following bolus administration of intravenous contrast. CONTRAST:  ISOVUE-300 IOPAMIDOL (ISOVUE-300) INJECTION 61% COMPARISON:  None. FINDINGS: Lower chest: The visualized lung bases are clear. There is dense coronary vascular calcification. No intra-abdominal free air.  Small free fluid within the pelvis. Hepatobiliary: The liver is unremarkable. No intrahepatic biliary ductal dilatation. The gallbladder is unremarkable as well. Pancreas: Unremarkable. No pancreatic ductal dilatation or surrounding inflammatory changes. Spleen: Normal in size without focal abnormality. Adrenals/Urinary Tract: The adrenal glands are unremarkable. There is severe bilateral renal parenchymal  atrophy and cortical thinning and irregularity. Multiple small nonobstructing bilateral renal calculi measure up to 5 mm in the upper pole of the right kidney. There are multiple bilateral renal hypodense lesions the larger cyst represent cysts and the smaller ones are too small to characterize but most likely represent cysts. There is no hydronephrosis on either side. The visualized ureters appear unremarkable. There is partial herniation of the urinary bladder into the left inguinal canal. Stomach/Bowel: There is a large left inguinal hernia containing the sigmoid colon and portion of the urinary bladder. There is stranding of the herniated fat with mild inflammatory changes of the herniated sigmoid colon. There is narrowing of the segments of the sigmoid colon at the hernia neck with high-grade narrowing of the exiting (efferent) limb. Small amount of free air noted in the hernia sac. There is associated degree of obstruction with backing up of the fecal content in the colon proximal to the sigmoid. Oral contrast opacifies the stomach and multiple loops of small bowel. There is no evidence of small-bowel obstruction. A 15 mm radiopaque tablet within the cecum likely an ingested pill. Normal appendix. Vascular/Lymphatic: There is moderate aortoiliac atherosclerotic disease. The aorta is mildly tortuous. The origins of the celiac axis, SMA, IMA as well as the origins of the renal arteries are patent. The SMV, splenic vein, and main portal vein are patent. No portal venous gas identified. There is no adenopathy. Reproductive: The prostate and seminal vesicles are grossly unremarkable. Other: Small fat containing umbilical hernia. Musculoskeletal: Osteopenia with mild scoliosis and degenerative changes of the spine. Extensive multilevel disc desiccation with vacuum phenomena. Subacute/chronic left femoral intertrochanteric fracture status post internal fixation. There is incomplete healing of the fracture fragments  at this time. Correlation with clinical exam and follow-up recommended. No definite acute fracture. IMPRESSION: 1. Large left inguinal hernia containing the  sigmoid colon and portion of the bladder. There is narrowing of the herniated sigmoid colon at the hernia neck with obstruction of the exiting loop. There is associated edema with small amount of fluid in the hernia. Correlation with clinical exam and surgical consult is advised. 2. Atrophic kidneys with multiple small cysts. Small nonobstructing bilateral renal calculi. No hydronephrosis. 3. Comminuted left femoral neck fracture status post prior internal fixation. There is incomplete healing of the fracture at this time. Follow-up recommended. 4. Moderate aortoiliac atherosclerotic disease. Electronically Signed   By: Elgie Collard M.D.   On: 07/16/2016 06:52    Procedures Dr. Sheliah Hatch (07/16/16) - Laparoscopic left inguinal hernia repair with mesh   Hospital Course:  Ralph Sullivan is a 71 year old male who presented to [facility] with pain and lump in his left groin.  Workup showed left inguinal hernia containing colon and bladder.  Patient was admitted and underwent procedure listed above.  Tolerated procedure well and was transferred to the floor.  Diet was advanced as tolerated.  On POD#1, the patient was voiding well, tolerating diet, ambulating well, pain well controlled, vital signs stable, incisions c/d/i and felt stable for discharge home.  Patient will follow up in our office in 2 weeks and knows to call with questions or concerns.  He will call to confirm appointment date/time.    Patient was discharged in good condition.  The [place] Substance controlled database was reviewed prior to prescribing narcotic pain medication to this patient.   Pt had a BM after surgery and continues to have flatus. Tolerated clears.   Physical Exam: General:  Alert, NAD, pleasant, cooperative, well appearing Cardio: RRR, S1 & S2 normal, no murmur,  rubs, gallops Resp: Effort normal, lungs CTA bilaterally, no wheezes, rales, rhonchi Abd:  Soft, ND, mild generalized TTP worse around port sites, incisions C/D/I   Allergies as of 07/17/2016      Reactions   Duloxetine Other (See Comments)   More nervous   Sulfur Nausea And Vomiting      Medication List    TAKE these medications   allopurinol 300 MG tablet Commonly known as:  ZYLOPRIM Take 300 mg by mouth daily.   CHROMIUM-CINNAMON PO Take 200 mg by mouth at bedtime.   cloNIDine 0.2 MG tablet Commonly known as:  CATAPRES Take 0.2 mg by mouth 2 (two) times daily.   escitalopram 10 MG tablet Commonly known as:  LEXAPRO Take 10 mg by mouth daily.   ferrous sulfate 325 (65 FE) MG tablet Take 325 mg by mouth daily with breakfast.   fluticasone 50 MCG/ACT nasal spray Commonly known as:  FLONASE Place 2 sprays into both nostrils daily.   HYDROcodone-acetaminophen 5-325 MG tablet Commonly known as:  NORCO/VICODIN Take 1 tablet by mouth every 4 (four) hours as needed for moderate pain.   metoprolol 100 MG tablet Commonly known as:  LOPRESSOR Take 200 mg by mouth 2 (two) times daily.   OMEGA 3 PO Take 1 capsule by mouth 2 (two) times daily.   vitamin E 400 UNIT capsule Take 400 Units by mouth daily.   warfarin 5 MG tablet Commonly known as:  COUMADIN Take 2.5 mg by mouth daily. What changed:  Another medication with the same name was removed. Continue taking this medication, and follow the directions you see here.   WELLBUTRIN XL 150 MG 24 hr tablet Generic drug:  buPROPion Take 150 mg by mouth daily.        Follow-up Information  Rodman PickleLuke Aaron Kinsinger, MD Follow up on 07/31/2016.   Specialty:  General Surgery Why:  11:30AM appointment time please arrive at 11:00 AM to complete paperwork Contact information: [address] [place] [address] [phone]           Signed: Joyce CopaJessica L [facility] 07/17/2016, 8:54  AM Pager: [phone] Consults: [phone] Mon-Fri 7:00 am-4:30 pm Sat-Sun 7:00 am-11:30 am

## 2016-07-17 NOTE — Discharge Instructions (Signed)
[facility]  UMBILICAL OR INGUINAL HERNIA REPAIR: POST OP INSTRUCTIONS  Always review your discharge instruction sheet given to you by the facility where your surgery was performed. IF YOU HAVE DISABILITY OR FAMILY LEAVE FORMS, YOU MUST BRING THEM TO THE OFFICE FOR PROCESSING.   DO NOT GIVE THEM TO YOUR DOCTOR.  1. A  prescription for pain medication may be given to you upon discharge.  Take your pain medication as prescribed, if needed.  If narcotic pain medicine is not needed, then you may take acetaminophen (Tylenol) or ibuprofen (Advil) as needed. 2. Take your usually prescribed medications unless otherwise directed. If you need a refill on your pain medication, please contact your pharmacy.  They will contact our office to request authorization. Prescriptions will not be filled after 5 pm or on week-ends. 3. You should follow a light diet the first 24 hours after arrival home, such as soup and crackers, etc.  Be sure to include lots of fluids daily.  Resume your normal diet the day after surgery. 4.Most patients will experience some swelling and bruising around the umbilicus or in the groin and scrotum.  Ice packs and reclining will help.  Swelling and bruising can take several days to resolve.  6. It is common to experience some constipation if taking pain medication after surgery.  Increasing fluid intake and taking a stool softener (such as Colace) will usually help or prevent this problem from occurring.  A mild laxative (Milk of Magnesia or Miralax) should be taken according to package directions if there are no bowel movements after 48 hours. 7. Unless discharge instructions indicate otherwise, you may remove your bandages 24-48 hours after surgery, and you may shower at that time.  You may have steri-strips (small skin tapes) in place directly over the incision.  These strips should be left on the skin for 7-10 days.  If your surgeon used skin glue on the  incision, you may shower in 24 hours.  The glue will flake off over the next 2-3 weeks.  Any sutures or staples will be removed at the office during your follow-up visit. 8. ACTIVITIES:  You may resume regular (light) daily activities beginning the next day--such as daily self-care, walking, climbing stairs--gradually increasing activities as tolerated.  You may have sexual intercourse when it is comfortable.  Refrain from any heavy lifting or straining until approved by your doctor.  a.You may drive when you are no longer taking prescription pain medication, you can comfortably wear a seatbelt, and you can safely maneuver your car and apply brakes.  9.You should see your doctor in the office for a follow-up appointment approximately 2-3 weeks after your surgery.  Make sure that you call for this appointment within a day or two after you arrive home to insure a convenient appointment time. 10.OTHER INSTRUCTIONS: _________________________    _____________________________________  WHEN TO CALL YOUR DOCTOR: 1. Fever over 101.0 2. Inability to urinate 3. Nausea and/or vomiting 4. Extreme swelling or bruising 5. Continued bleeding from incision. 6. Increased pain, redness, or drainage from the incision  The clinic staff is available to answer your questions during regular business hours.  Please dont hesitate to call and ask to speak to one of the nurses for clinical concerns.  If you have a medical emergency, go to the nearest emergency room or call 911.  A surgeon from [facility] is always on call at the hospital   [address], [address],  [place], [place]  [address] ?  [address], [employer], [place]   [phone] ? [phone] ? FAX [phone] Web site: [email or web address]

## 2016-07-17 NOTE — Progress Notes (Signed)
Patient discharged to home. Discharge instructions were reviewed with patient and a copy was given to patient as well. Patient agreed and verbalized understanding. No further questions at this moment. Patient left unit via ambulatory.  Ralph Sullivan, Fleet Higham n 07/17/16 11:59 AM

## 2016-07-17 NOTE — Plan of Care (Signed)
Problem: Nutrition: Goal: Adequate nutrition will be maintained Outcome: Progressing Diet advanced to regular diet from clear liquid.

## 2016-07-20 DIAGNOSIS — H26493 Other secondary cataract, bilateral: Secondary | ICD-10-CM | POA: Diagnosis not present

## 2016-07-21 DIAGNOSIS — N189 Chronic kidney disease, unspecified: Secondary | ICD-10-CM | POA: Diagnosis not present

## 2016-07-21 DIAGNOSIS — I482 Chronic atrial fibrillation: Secondary | ICD-10-CM | POA: Diagnosis not present

## 2016-07-21 DIAGNOSIS — M80052D Age-related osteoporosis with current pathological fracture, left femur, subsequent encounter for fracture with routine healing: Secondary | ICD-10-CM | POA: Diagnosis not present

## 2016-07-21 DIAGNOSIS — F329 Major depressive disorder, single episode, unspecified: Secondary | ICD-10-CM | POA: Diagnosis not present

## 2016-07-21 DIAGNOSIS — I129 Hypertensive chronic kidney disease with stage 1 through stage 4 chronic kidney disease, or unspecified chronic kidney disease: Secondary | ICD-10-CM | POA: Diagnosis not present

## 2016-07-21 DIAGNOSIS — M1712 Unilateral primary osteoarthritis, left knee: Secondary | ICD-10-CM | POA: Diagnosis not present

## 2016-07-22 ENCOUNTER — Ambulatory Visit (INDEPENDENT_AMBULATORY_CARE_PROVIDER_SITE_OTHER): Payer: Medicare Other | Admitting: *Deleted

## 2016-07-22 DIAGNOSIS — I129 Hypertensive chronic kidney disease with stage 1 through stage 4 chronic kidney disease, or unspecified chronic kidney disease: Secondary | ICD-10-CM | POA: Diagnosis not present

## 2016-07-22 DIAGNOSIS — M80052D Age-related osteoporosis with current pathological fracture, left femur, subsequent encounter for fracture with routine healing: Secondary | ICD-10-CM | POA: Diagnosis not present

## 2016-07-22 DIAGNOSIS — I481 Persistent atrial fibrillation: Secondary | ICD-10-CM | POA: Diagnosis not present

## 2016-07-22 DIAGNOSIS — I482 Chronic atrial fibrillation: Secondary | ICD-10-CM | POA: Diagnosis not present

## 2016-07-22 DIAGNOSIS — M1712 Unilateral primary osteoarthritis, left knee: Secondary | ICD-10-CM | POA: Diagnosis not present

## 2016-07-22 DIAGNOSIS — I4819 Other persistent atrial fibrillation: Secondary | ICD-10-CM

## 2016-07-22 DIAGNOSIS — F329 Major depressive disorder, single episode, unspecified: Secondary | ICD-10-CM | POA: Diagnosis not present

## 2016-07-22 DIAGNOSIS — Z5181 Encounter for therapeutic drug level monitoring: Secondary | ICD-10-CM

## 2016-07-22 DIAGNOSIS — N189 Chronic kidney disease, unspecified: Secondary | ICD-10-CM | POA: Diagnosis not present

## 2016-07-22 LAB — POCT INR: INR: 1.1

## 2016-07-29 DIAGNOSIS — S72142D Displaced intertrochanteric fracture of left femur, subsequent encounter for closed fracture with routine healing: Secondary | ICD-10-CM | POA: Diagnosis not present

## 2016-07-31 ENCOUNTER — Ambulatory Visit (INDEPENDENT_AMBULATORY_CARE_PROVIDER_SITE_OTHER): Payer: Medicare Other

## 2016-07-31 DIAGNOSIS — I481 Persistent atrial fibrillation: Secondary | ICD-10-CM | POA: Diagnosis not present

## 2016-07-31 DIAGNOSIS — I4819 Other persistent atrial fibrillation: Secondary | ICD-10-CM

## 2016-07-31 DIAGNOSIS — Z5181 Encounter for therapeutic drug level monitoring: Secondary | ICD-10-CM | POA: Diagnosis not present

## 2016-07-31 LAB — POCT INR: INR: 1.2

## 2016-08-03 DIAGNOSIS — M80052D Age-related osteoporosis with current pathological fracture, left femur, subsequent encounter for fracture with routine healing: Secondary | ICD-10-CM | POA: Diagnosis not present

## 2016-08-03 DIAGNOSIS — I129 Hypertensive chronic kidney disease with stage 1 through stage 4 chronic kidney disease, or unspecified chronic kidney disease: Secondary | ICD-10-CM | POA: Diagnosis not present

## 2016-08-03 DIAGNOSIS — N189 Chronic kidney disease, unspecified: Secondary | ICD-10-CM | POA: Diagnosis not present

## 2016-08-03 DIAGNOSIS — F329 Major depressive disorder, single episode, unspecified: Secondary | ICD-10-CM | POA: Diagnosis not present

## 2016-08-03 DIAGNOSIS — I482 Chronic atrial fibrillation: Secondary | ICD-10-CM | POA: Diagnosis not present

## 2016-08-03 DIAGNOSIS — M1712 Unilateral primary osteoarthritis, left knee: Secondary | ICD-10-CM | POA: Diagnosis not present

## 2016-08-05 DIAGNOSIS — M1712 Unilateral primary osteoarthritis, left knee: Secondary | ICD-10-CM | POA: Diagnosis not present

## 2016-08-05 DIAGNOSIS — I482 Chronic atrial fibrillation: Secondary | ICD-10-CM | POA: Diagnosis not present

## 2016-08-05 DIAGNOSIS — M80052D Age-related osteoporosis with current pathological fracture, left femur, subsequent encounter for fracture with routine healing: Secondary | ICD-10-CM | POA: Diagnosis not present

## 2016-08-05 DIAGNOSIS — I129 Hypertensive chronic kidney disease with stage 1 through stage 4 chronic kidney disease, or unspecified chronic kidney disease: Secondary | ICD-10-CM | POA: Diagnosis not present

## 2016-08-05 DIAGNOSIS — F329 Major depressive disorder, single episode, unspecified: Secondary | ICD-10-CM | POA: Diagnosis not present

## 2016-08-05 DIAGNOSIS — N189 Chronic kidney disease, unspecified: Secondary | ICD-10-CM | POA: Diagnosis not present

## 2016-08-10 ENCOUNTER — Ambulatory Visit (INDEPENDENT_AMBULATORY_CARE_PROVIDER_SITE_OTHER): Payer: Medicare Other | Admitting: *Deleted

## 2016-08-10 DIAGNOSIS — I482 Chronic atrial fibrillation: Secondary | ICD-10-CM | POA: Diagnosis not present

## 2016-08-10 DIAGNOSIS — I481 Persistent atrial fibrillation: Secondary | ICD-10-CM | POA: Diagnosis not present

## 2016-08-10 DIAGNOSIS — M1712 Unilateral primary osteoarthritis, left knee: Secondary | ICD-10-CM | POA: Diagnosis not present

## 2016-08-10 DIAGNOSIS — I129 Hypertensive chronic kidney disease with stage 1 through stage 4 chronic kidney disease, or unspecified chronic kidney disease: Secondary | ICD-10-CM | POA: Diagnosis not present

## 2016-08-10 DIAGNOSIS — I4819 Other persistent atrial fibrillation: Secondary | ICD-10-CM

## 2016-08-10 DIAGNOSIS — M80052D Age-related osteoporosis with current pathological fracture, left femur, subsequent encounter for fracture with routine healing: Secondary | ICD-10-CM | POA: Diagnosis not present

## 2016-08-10 DIAGNOSIS — N189 Chronic kidney disease, unspecified: Secondary | ICD-10-CM | POA: Diagnosis not present

## 2016-08-10 DIAGNOSIS — F329 Major depressive disorder, single episode, unspecified: Secondary | ICD-10-CM | POA: Diagnosis not present

## 2016-08-10 DIAGNOSIS — Z5181 Encounter for therapeutic drug level monitoring: Secondary | ICD-10-CM

## 2016-08-10 LAB — POCT INR: INR: 2.4

## 2016-08-11 DIAGNOSIS — I129 Hypertensive chronic kidney disease with stage 1 through stage 4 chronic kidney disease, or unspecified chronic kidney disease: Secondary | ICD-10-CM | POA: Diagnosis not present

## 2016-08-11 DIAGNOSIS — M1712 Unilateral primary osteoarthritis, left knee: Secondary | ICD-10-CM | POA: Diagnosis not present

## 2016-08-11 DIAGNOSIS — I482 Chronic atrial fibrillation: Secondary | ICD-10-CM | POA: Diagnosis not present

## 2016-08-11 DIAGNOSIS — M80052D Age-related osteoporosis with current pathological fracture, left femur, subsequent encounter for fracture with routine healing: Secondary | ICD-10-CM | POA: Diagnosis not present

## 2016-08-11 DIAGNOSIS — E669 Obesity, unspecified: Secondary | ICD-10-CM | POA: Diagnosis not present

## 2016-08-11 DIAGNOSIS — Z7901 Long term (current) use of anticoagulants: Secondary | ICD-10-CM | POA: Diagnosis not present

## 2016-08-11 DIAGNOSIS — Z9181 History of falling: Secondary | ICD-10-CM | POA: Diagnosis not present

## 2016-08-11 DIAGNOSIS — Z6830 Body mass index (BMI) 30.0-30.9, adult: Secondary | ICD-10-CM | POA: Diagnosis not present

## 2016-08-11 DIAGNOSIS — Z79891 Long term (current) use of opiate analgesic: Secondary | ICD-10-CM | POA: Diagnosis not present

## 2016-08-11 DIAGNOSIS — Z87891 Personal history of nicotine dependence: Secondary | ICD-10-CM | POA: Diagnosis not present

## 2016-08-11 DIAGNOSIS — N189 Chronic kidney disease, unspecified: Secondary | ICD-10-CM | POA: Diagnosis not present

## 2016-08-11 DIAGNOSIS — F329 Major depressive disorder, single episode, unspecified: Secondary | ICD-10-CM | POA: Diagnosis not present

## 2016-08-13 DIAGNOSIS — M80052D Age-related osteoporosis with current pathological fracture, left femur, subsequent encounter for fracture with routine healing: Secondary | ICD-10-CM | POA: Diagnosis not present

## 2016-08-13 DIAGNOSIS — M1712 Unilateral primary osteoarthritis, left knee: Secondary | ICD-10-CM | POA: Diagnosis not present

## 2016-08-13 DIAGNOSIS — F329 Major depressive disorder, single episode, unspecified: Secondary | ICD-10-CM | POA: Diagnosis not present

## 2016-08-13 DIAGNOSIS — I129 Hypertensive chronic kidney disease with stage 1 through stage 4 chronic kidney disease, or unspecified chronic kidney disease: Secondary | ICD-10-CM | POA: Diagnosis not present

## 2016-08-13 DIAGNOSIS — N189 Chronic kidney disease, unspecified: Secondary | ICD-10-CM | POA: Diagnosis not present

## 2016-08-13 DIAGNOSIS — I482 Chronic atrial fibrillation: Secondary | ICD-10-CM | POA: Diagnosis not present

## 2016-08-18 DIAGNOSIS — N183 Chronic kidney disease, stage 3 (moderate): Secondary | ICD-10-CM | POA: Diagnosis not present

## 2016-08-18 DIAGNOSIS — D631 Anemia in chronic kidney disease: Secondary | ICD-10-CM | POA: Diagnosis not present

## 2016-08-18 DIAGNOSIS — Z6827 Body mass index (BMI) 27.0-27.9, adult: Secondary | ICD-10-CM | POA: Diagnosis not present

## 2016-08-18 DIAGNOSIS — N2581 Secondary hyperparathyroidism of renal origin: Secondary | ICD-10-CM | POA: Diagnosis not present

## 2016-08-18 DIAGNOSIS — E785 Hyperlipidemia, unspecified: Secondary | ICD-10-CM | POA: Diagnosis not present

## 2016-08-19 DIAGNOSIS — F329 Major depressive disorder, single episode, unspecified: Secondary | ICD-10-CM | POA: Diagnosis not present

## 2016-08-19 DIAGNOSIS — I129 Hypertensive chronic kidney disease with stage 1 through stage 4 chronic kidney disease, or unspecified chronic kidney disease: Secondary | ICD-10-CM | POA: Diagnosis not present

## 2016-08-19 DIAGNOSIS — M80052D Age-related osteoporosis with current pathological fracture, left femur, subsequent encounter for fracture with routine healing: Secondary | ICD-10-CM | POA: Diagnosis not present

## 2016-08-19 DIAGNOSIS — N189 Chronic kidney disease, unspecified: Secondary | ICD-10-CM | POA: Diagnosis not present

## 2016-08-19 DIAGNOSIS — M1712 Unilateral primary osteoarthritis, left knee: Secondary | ICD-10-CM | POA: Diagnosis not present

## 2016-08-19 DIAGNOSIS — I482 Chronic atrial fibrillation: Secondary | ICD-10-CM | POA: Diagnosis not present

## 2016-08-21 DIAGNOSIS — N189 Chronic kidney disease, unspecified: Secondary | ICD-10-CM | POA: Diagnosis not present

## 2016-08-21 DIAGNOSIS — I129 Hypertensive chronic kidney disease with stage 1 through stage 4 chronic kidney disease, or unspecified chronic kidney disease: Secondary | ICD-10-CM | POA: Diagnosis not present

## 2016-08-21 DIAGNOSIS — I482 Chronic atrial fibrillation: Secondary | ICD-10-CM | POA: Diagnosis not present

## 2016-08-21 DIAGNOSIS — F329 Major depressive disorder, single episode, unspecified: Secondary | ICD-10-CM | POA: Diagnosis not present

## 2016-08-21 DIAGNOSIS — M80052D Age-related osteoporosis with current pathological fracture, left femur, subsequent encounter for fracture with routine healing: Secondary | ICD-10-CM | POA: Diagnosis not present

## 2016-08-21 DIAGNOSIS — M1712 Unilateral primary osteoarthritis, left knee: Secondary | ICD-10-CM | POA: Diagnosis not present

## 2016-08-24 ENCOUNTER — Ambulatory Visit (INDEPENDENT_AMBULATORY_CARE_PROVIDER_SITE_OTHER): Payer: Medicare Other | Admitting: *Deleted

## 2016-08-24 DIAGNOSIS — I481 Persistent atrial fibrillation: Secondary | ICD-10-CM

## 2016-08-24 DIAGNOSIS — I4819 Other persistent atrial fibrillation: Secondary | ICD-10-CM

## 2016-08-24 DIAGNOSIS — I482 Chronic atrial fibrillation: Secondary | ICD-10-CM | POA: Diagnosis not present

## 2016-08-24 DIAGNOSIS — Z5181 Encounter for therapeutic drug level monitoring: Secondary | ICD-10-CM | POA: Diagnosis not present

## 2016-08-24 DIAGNOSIS — F329 Major depressive disorder, single episode, unspecified: Secondary | ICD-10-CM | POA: Diagnosis not present

## 2016-08-24 DIAGNOSIS — I129 Hypertensive chronic kidney disease with stage 1 through stage 4 chronic kidney disease, or unspecified chronic kidney disease: Secondary | ICD-10-CM | POA: Diagnosis not present

## 2016-08-24 DIAGNOSIS — M1712 Unilateral primary osteoarthritis, left knee: Secondary | ICD-10-CM | POA: Diagnosis not present

## 2016-08-24 DIAGNOSIS — N189 Chronic kidney disease, unspecified: Secondary | ICD-10-CM | POA: Diagnosis not present

## 2016-08-24 DIAGNOSIS — M80052D Age-related osteoporosis with current pathological fracture, left femur, subsequent encounter for fracture with routine healing: Secondary | ICD-10-CM | POA: Diagnosis not present

## 2016-08-24 LAB — POCT INR: INR: 2.3

## 2016-08-26 DIAGNOSIS — M80052D Age-related osteoporosis with current pathological fracture, left femur, subsequent encounter for fracture with routine healing: Secondary | ICD-10-CM | POA: Diagnosis not present

## 2016-08-26 DIAGNOSIS — I129 Hypertensive chronic kidney disease with stage 1 through stage 4 chronic kidney disease, or unspecified chronic kidney disease: Secondary | ICD-10-CM | POA: Diagnosis not present

## 2016-08-26 DIAGNOSIS — F329 Major depressive disorder, single episode, unspecified: Secondary | ICD-10-CM | POA: Diagnosis not present

## 2016-08-26 DIAGNOSIS — I482 Chronic atrial fibrillation: Secondary | ICD-10-CM | POA: Diagnosis not present

## 2016-08-26 DIAGNOSIS — N189 Chronic kidney disease, unspecified: Secondary | ICD-10-CM | POA: Diagnosis not present

## 2016-08-26 DIAGNOSIS — M1712 Unilateral primary osteoarthritis, left knee: Secondary | ICD-10-CM | POA: Diagnosis not present

## 2016-08-27 DIAGNOSIS — F411 Generalized anxiety disorder: Secondary | ICD-10-CM | POA: Diagnosis not present

## 2016-08-27 DIAGNOSIS — N183 Chronic kidney disease, stage 3 (moderate): Secondary | ICD-10-CM | POA: Diagnosis not present

## 2016-08-27 DIAGNOSIS — D508 Other iron deficiency anemias: Secondary | ICD-10-CM | POA: Diagnosis not present

## 2016-08-27 DIAGNOSIS — G894 Chronic pain syndrome: Secondary | ICD-10-CM | POA: Diagnosis not present

## 2016-08-31 DIAGNOSIS — I129 Hypertensive chronic kidney disease with stage 1 through stage 4 chronic kidney disease, or unspecified chronic kidney disease: Secondary | ICD-10-CM | POA: Diagnosis not present

## 2016-08-31 DIAGNOSIS — M80052D Age-related osteoporosis with current pathological fracture, left femur, subsequent encounter for fracture with routine healing: Secondary | ICD-10-CM | POA: Diagnosis not present

## 2016-08-31 DIAGNOSIS — I482 Chronic atrial fibrillation: Secondary | ICD-10-CM | POA: Diagnosis not present

## 2016-08-31 DIAGNOSIS — M1712 Unilateral primary osteoarthritis, left knee: Secondary | ICD-10-CM | POA: Diagnosis not present

## 2016-08-31 DIAGNOSIS — N189 Chronic kidney disease, unspecified: Secondary | ICD-10-CM | POA: Diagnosis not present

## 2016-08-31 DIAGNOSIS — F329 Major depressive disorder, single episode, unspecified: Secondary | ICD-10-CM | POA: Diagnosis not present

## 2016-09-02 DIAGNOSIS — F329 Major depressive disorder, single episode, unspecified: Secondary | ICD-10-CM | POA: Diagnosis not present

## 2016-09-02 DIAGNOSIS — M1712 Unilateral primary osteoarthritis, left knee: Secondary | ICD-10-CM | POA: Diagnosis not present

## 2016-09-02 DIAGNOSIS — N189 Chronic kidney disease, unspecified: Secondary | ICD-10-CM | POA: Diagnosis not present

## 2016-09-02 DIAGNOSIS — I482 Chronic atrial fibrillation: Secondary | ICD-10-CM | POA: Diagnosis not present

## 2016-09-02 DIAGNOSIS — M80052D Age-related osteoporosis with current pathological fracture, left femur, subsequent encounter for fracture with routine healing: Secondary | ICD-10-CM | POA: Diagnosis not present

## 2016-09-02 DIAGNOSIS — I129 Hypertensive chronic kidney disease with stage 1 through stage 4 chronic kidney disease, or unspecified chronic kidney disease: Secondary | ICD-10-CM | POA: Diagnosis not present

## 2016-09-03 DIAGNOSIS — D631 Anemia in chronic kidney disease: Secondary | ICD-10-CM | POA: Diagnosis not present

## 2016-09-03 DIAGNOSIS — N189 Chronic kidney disease, unspecified: Secondary | ICD-10-CM | POA: Diagnosis not present

## 2016-09-07 DIAGNOSIS — M25561 Pain in right knee: Secondary | ICD-10-CM | POA: Diagnosis not present

## 2016-09-07 DIAGNOSIS — S72142D Displaced intertrochanteric fracture of left femur, subsequent encounter for closed fracture with routine healing: Secondary | ICD-10-CM | POA: Diagnosis not present

## 2016-09-08 ENCOUNTER — Encounter: Payer: Self-pay | Admitting: Cardiology

## 2016-09-08 DIAGNOSIS — I129 Hypertensive chronic kidney disease with stage 1 through stage 4 chronic kidney disease, or unspecified chronic kidney disease: Secondary | ICD-10-CM | POA: Diagnosis not present

## 2016-09-08 DIAGNOSIS — F329 Major depressive disorder, single episode, unspecified: Secondary | ICD-10-CM | POA: Diagnosis not present

## 2016-09-08 DIAGNOSIS — I482 Chronic atrial fibrillation: Secondary | ICD-10-CM | POA: Diagnosis not present

## 2016-09-08 DIAGNOSIS — M80052D Age-related osteoporosis with current pathological fracture, left femur, subsequent encounter for fracture with routine healing: Secondary | ICD-10-CM | POA: Diagnosis not present

## 2016-09-08 DIAGNOSIS — N189 Chronic kidney disease, unspecified: Secondary | ICD-10-CM | POA: Diagnosis not present

## 2016-09-08 DIAGNOSIS — M1712 Unilateral primary osteoarthritis, left knee: Secondary | ICD-10-CM | POA: Diagnosis not present

## 2016-09-09 ENCOUNTER — Encounter: Payer: Self-pay | Admitting: Cardiology

## 2016-09-09 ENCOUNTER — Ambulatory Visit (INDEPENDENT_AMBULATORY_CARE_PROVIDER_SITE_OTHER): Payer: Medicare Other | Admitting: Cardiology

## 2016-09-09 ENCOUNTER — Ambulatory Visit (INDEPENDENT_AMBULATORY_CARE_PROVIDER_SITE_OTHER): Payer: Medicare Other | Admitting: *Deleted

## 2016-09-09 VITALS — BP 138/104 | HR 90 | Ht 70.0 in | Wt 195.4 lb

## 2016-09-09 DIAGNOSIS — I10 Essential (primary) hypertension: Secondary | ICD-10-CM | POA: Diagnosis not present

## 2016-09-09 DIAGNOSIS — Z5181 Encounter for therapeutic drug level monitoring: Secondary | ICD-10-CM

## 2016-09-09 DIAGNOSIS — I481 Persistent atrial fibrillation: Secondary | ICD-10-CM | POA: Diagnosis not present

## 2016-09-09 DIAGNOSIS — I4821 Permanent atrial fibrillation: Secondary | ICD-10-CM

## 2016-09-09 DIAGNOSIS — I4819 Other persistent atrial fibrillation: Secondary | ICD-10-CM

## 2016-09-09 DIAGNOSIS — I77810 Thoracic aortic ectasia: Secondary | ICD-10-CM | POA: Diagnosis not present

## 2016-09-09 DIAGNOSIS — Z7901 Long term (current) use of anticoagulants: Secondary | ICD-10-CM

## 2016-09-09 DIAGNOSIS — I482 Chronic atrial fibrillation: Secondary | ICD-10-CM | POA: Diagnosis not present

## 2016-09-09 LAB — POCT INR: INR: 1.5

## 2016-09-09 NOTE — Patient Instructions (Signed)
Medication Instructions:  Your physician recommends that you continue on your current medications as directed. Please refer to the Current Medication list given to you today.   Labwork: None Ordered   Testing/Procedures: None Ordered   Follow-Up: Your physician wants you to follow-up in: 6 months.  You will receive a reminder letter in the mail two months in advance. If you don't receive a letter, please call our office to schedule the follow-up appointment.   If you need a refill on your cardiac medications before your next appointment, please call your pharmacy.   Thank you for choosing [facility]! Swanson Farnell, RN [phone]    

## 2016-09-09 NOTE — Progress Notes (Signed)
[address] [place], [place]  [address] Phone: [phone] Fax:  [phone]  Date:  09/09/2016   ID:  Ralph Sullivan, DOB [birth year], MRN [id number]  PCP:  Lorenda Ishihara, MD   History of Present Illness: Ralph Sullivan is a 71 y.o. male (husband of Ralph Sullivan) who presented to me in June of 2014 with return of atrial fibrillation. Heart rate 79 on EKG with right bundle branch block, left anterior fascicular block, bifascicular block.  In 2009, he converted with amiodarone. Amiodarone was used because of moderate LVH seen on prior echocardiogram, normal EF. In 2011 went back in afib (off of amiodarone). He believes he has been in atrial fibrillation over the past 3 years. We have been maintaining rate control. He has not had any strokelike symptoms. He does battled with gout occasionally. No alcohol intake. No recent fevers. HR was slowed in NSR.  For the last few years however he has been in atrial fibrillation with good rate control.   An echocardiogram 6/14 demonstrated aortic root size 4.5 cm with mild LVH, normal EF. On repeat 6/15 and in 2017 - 4.2cm  Spirometry was reassuring. Has lost weight.   SOB weakness in legs, ABIs were normal. Obviously still grieving the loss of his wife Ralph Sullivan. He has been battling gout as well. Periodic use of prednisone. Mild left ankle swelling.  5/60/18-his ankle swelling has improved since stopping the amlodipine. Still feeling disequilibrium. Physical therapy. No chest pain, no significant shortness of breath. No syncope.   Wt Readings from Last 3 Encounters:  09/09/16 195 lb 6.4 oz (88.6 kg)  07/16/16 187 lb 14.4 oz (85.2 kg)  06/26/16 200 lb (90.7 kg)     Past Medical History:  Diagnosis Date  . Atrial fibrillation (HCC) 02/08/2013   CHADS-VAS - 2 (AGE, HTN). On anticoagulation. Holter 6/14 - 2.3 sec pause. Avg HR 87. Rate control   . Bifascicular block 03/28/2013  . Bilateral congenital hammer toes   . Chronic  anticoagulation 03/28/2013  . Chronic kidney disease, stage III (moderate) 03/28/2013  . 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 Gout   . Hyperhidrosis 03/28/2013  . Obesity, unspecified 03/28/2013  . Rhabdomyolysis 09/2015  . 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 CATARACT EXTRACTION    . HERNIA REPAIR    . INGUINAL HERNIA REPAIR  07/16/2016  . INGUINAL HERNIA REPAIR Left 07/16/2016   Procedure: LAPAROSCOPIC INGUINAL HERNIA REPAIR;  Surgeon: De Blanch Kinsinger, MD;  Location: [facility] OR;  Service: General;  Laterality: Left;  . INSERTION OF MESH Left 07/16/2016   Procedure: INSERTION OF MESH;  Surgeon: Rodman Pickle, MD;  Location: [facility] OR;  Service: General;  Laterality: Left;  . KIDNEY STONE SURGERY      Current Outpatient Prescriptions  Medication Sig Dispense Refill  . allopurinol (ZYLOPRIM) 300 MG tablet Take 300 mg by mouth daily.     Marland Kitchen buPROPion (WELLBUTRIN XL) 150 MG 24 hr tablet Take 150 mg by mouth daily.    . CHROMIUM-CINNAMON PO Take 200 mg by mouth at bedtime.     . cloNIDine (CATAPRES) 0.2 MG tablet Take 0.2 mg by mouth 2 (two) times daily.     Marland Kitchen escitalopram (LEXAPRO) 10 MG tablet Take 10 mg by mouth daily.    . ferrous sulfate 325 (  65 FE) MG tablet Take 325 mg by mouth daily with breakfast.    . fluticasone (FLONASE) 50 MCG/ACT nasal spray Place 2 sprays into both nostrils daily.     . metoprolol (LOPRESSOR) 100 MG tablet Take 200 mg by mouth 2 (two) times daily.     . Omega-3 Fatty Acids (OMEGA 3 PO) Take 1 capsule by mouth 2 (two) times daily.     . vitamin E 400 UNIT capsule Take 400 Units by mouth daily.    Marland Kitchen. warfarin (COUMADIN) 5 MG tablet Take 2.5 mg by mouth daily.     No current facility-administered medications for this visit.     Allergies:    Allergies  Allergen Reactions  . Duloxetine Other (See Comments)    More nervous    . Sulfur Nausea And Vomiting    Social History:  The patient  reports that he quit smoking about 46 years ago. He has never used smokeless tobacco. He reports that he does not drink alcohol or use drugs.   ROS:  Please see the history of present illness.  Unless above all other review of systems negative.   PHYSICAL EXAM: VS:  BP (!) 138/104 (BP Location: Right Arm, Patient Position: Sitting, Cuff Size: Large)   Pulse 90   Ht 5\' 10"  (1.778 m)   Wt 195 lb 6.4 oz (88.6 kg)   SpO2 90%   BMI 28.04 kg/m  Well nourished, well developed, in no acute distress  HEENT: normal  Neck: no JVD  Cardiac:  normal S1, S2; irreg irreg; no murmur  Lungs:  clear to auscultation bilaterally, no wheezing, rhonchi or rales  Abd: soft, nontender, no hepatomegaly Overweight Ext: no edema  Skin: warm and dry  palpable distal pulses  Neuro: no focal abnormalities noted  EKG:   today 05/02/15-atrial fibrillation heart rate 88 bpm, right bundle branch block, left anterior fascicular block, bifascicular block. 04/10/14-right bundle branch block, atrial fibrillation, 79, left anterior fascicular block, bifascicular block-no change from prior Atrial fibrillation heart rate 69, right bundle branch block, left anterior specific block, bifascicular block     ABIs 05/10/15-normal  ECHO 05/10/15: - Left ventricle: The cavity size was normal. There was mild  hypertrophy of the posterior wall and moderate hypertrophy of the  septal wall. Systolic function was at the lower limits of normal.  The estimated ejection fraction was in the range of 50% to 55%. - Left atrium: The atrium was severely dilated. - Pulmonary arteries: The main pulmonary artery was normal-sized.  Systolic pressure was within the normal range, estimated to be 28  mm Hg. - Aortic root-42 mm-stable - Inferior vena cava: The vessel was normal in size. The  respirophasic diameter changes were in the normal range (= 50%),  consistent with normal  central venous pressure.  ASSESSMENT AND PLAN:   Permanent atrial fibrillation  - Overall doing well with rate control with high-dose metoprolol 200 mg twice a day.  - Prior Holter monitor showed a 2.3 second pause. Stable.  Fatigue  - Likely multifactorial. He is getting iron infusion from Dr. Hyman HopesWebb.  - Continue to encourage movement, exercise.  - He has lost weight. His TSH has been normal in the past. Remember, he lost his wife last year, has been going through quite a bit.  Disequilibrium  - This is his main complaint today. Utilizing walker for stability. Suffered fall, hurt left shoulder.  - Continuing with physical therapy for this. In review of his medications I do  not see a particular medicine that could be contribute into his symptoms.  Essential hypertension  - Elevated today during clinic. He has noted increase in blood pressure since iron infusion. Normally it has been approximate 120/80. We will not make any changes. He also has been taking Catapres or clonidine 0.2 mg 2 times a day for quite some time. Course fatigue Be a side effect with this medication. He is more concerned about the disequilibrium however.  Dilated aortic root  - 42 mm. Stable.  Chronic kidney disease stage III  - Dr. Hyman Hopes has been monitoring.  Chronic anticoagulation  - Coumadin. We are watching in clinic.   60-month follow-up  Signed, Donato Schultz, MD [facility]  09/09/2016 9:29 AM

## 2016-09-10 DIAGNOSIS — I482 Chronic atrial fibrillation: Secondary | ICD-10-CM | POA: Diagnosis not present

## 2016-09-10 DIAGNOSIS — D631 Anemia in chronic kidney disease: Secondary | ICD-10-CM | POA: Diagnosis not present

## 2016-09-10 DIAGNOSIS — N189 Chronic kidney disease, unspecified: Secondary | ICD-10-CM | POA: Diagnosis not present

## 2016-09-10 DIAGNOSIS — I129 Hypertensive chronic kidney disease with stage 1 through stage 4 chronic kidney disease, or unspecified chronic kidney disease: Secondary | ICD-10-CM | POA: Diagnosis not present

## 2016-09-10 DIAGNOSIS — F329 Major depressive disorder, single episode, unspecified: Secondary | ICD-10-CM | POA: Diagnosis not present

## 2016-09-10 DIAGNOSIS — M80052D Age-related osteoporosis with current pathological fracture, left femur, subsequent encounter for fracture with routine healing: Secondary | ICD-10-CM | POA: Diagnosis not present

## 2016-09-10 DIAGNOSIS — M1712 Unilateral primary osteoarthritis, left knee: Secondary | ICD-10-CM | POA: Diagnosis not present

## 2016-09-14 DIAGNOSIS — M1712 Unilateral primary osteoarthritis, left knee: Secondary | ICD-10-CM | POA: Diagnosis not present

## 2016-09-14 DIAGNOSIS — M80052D Age-related osteoporosis with current pathological fracture, left femur, subsequent encounter for fracture with routine healing: Secondary | ICD-10-CM | POA: Diagnosis not present

## 2016-09-14 DIAGNOSIS — I482 Chronic atrial fibrillation: Secondary | ICD-10-CM | POA: Diagnosis not present

## 2016-09-14 DIAGNOSIS — N189 Chronic kidney disease, unspecified: Secondary | ICD-10-CM | POA: Diagnosis not present

## 2016-09-14 DIAGNOSIS — I129 Hypertensive chronic kidney disease with stage 1 through stage 4 chronic kidney disease, or unspecified chronic kidney disease: Secondary | ICD-10-CM | POA: Diagnosis not present

## 2016-09-14 DIAGNOSIS — F329 Major depressive disorder, single episode, unspecified: Secondary | ICD-10-CM | POA: Diagnosis not present

## 2016-09-16 ENCOUNTER — Other Ambulatory Visit: Payer: Self-pay | Admitting: *Deleted

## 2016-09-16 DIAGNOSIS — D631 Anemia in chronic kidney disease: Secondary | ICD-10-CM

## 2016-09-16 DIAGNOSIS — M80052D Age-related osteoporosis with current pathological fracture, left femur, subsequent encounter for fracture with routine healing: Secondary | ICD-10-CM | POA: Diagnosis not present

## 2016-09-16 DIAGNOSIS — F329 Major depressive disorder, single episode, unspecified: Secondary | ICD-10-CM | POA: Diagnosis not present

## 2016-09-16 DIAGNOSIS — I129 Hypertensive chronic kidney disease with stage 1 through stage 4 chronic kidney disease, or unspecified chronic kidney disease: Secondary | ICD-10-CM | POA: Diagnosis not present

## 2016-09-16 DIAGNOSIS — I482 Chronic atrial fibrillation: Secondary | ICD-10-CM | POA: Diagnosis not present

## 2016-09-16 DIAGNOSIS — M1712 Unilateral primary osteoarthritis, left knee: Secondary | ICD-10-CM | POA: Diagnosis not present

## 2016-09-16 DIAGNOSIS — N189 Chronic kidney disease, unspecified: Principal | ICD-10-CM

## 2016-09-23 ENCOUNTER — Ambulatory Visit (INDEPENDENT_AMBULATORY_CARE_PROVIDER_SITE_OTHER): Payer: Medicare Other | Admitting: *Deleted

## 2016-09-23 ENCOUNTER — Other Ambulatory Visit: Payer: Medicare Other | Admitting: *Deleted

## 2016-09-23 DIAGNOSIS — I4819 Other persistent atrial fibrillation: Secondary | ICD-10-CM

## 2016-09-23 DIAGNOSIS — Z5181 Encounter for therapeutic drug level monitoring: Secondary | ICD-10-CM

## 2016-09-23 DIAGNOSIS — I481 Persistent atrial fibrillation: Secondary | ICD-10-CM

## 2016-09-23 DIAGNOSIS — N189 Chronic kidney disease, unspecified: Principal | ICD-10-CM

## 2016-09-23 DIAGNOSIS — D631 Anemia in chronic kidney disease: Secondary | ICD-10-CM | POA: Diagnosis not present

## 2016-09-23 LAB — IRON AND TIBC
Iron Saturation: 47 % (ref 15–55)
Iron: 116 ug/dL (ref 38–169)
Total Iron Binding Capacity: 249 ug/dL — ABNORMAL LOW (ref 250–450)
UIBC: 133 ug/dL (ref 111–343)

## 2016-09-23 LAB — POCT INR: INR: 1.9

## 2016-09-23 LAB — HEMOGLOBIN: Hemoglobin: 12.7 g/dL — ABNORMAL LOW (ref 13.0–17.7)

## 2016-09-23 LAB — FERRITIN: FERRITIN: 409 ng/mL — AB (ref 30–400)

## 2016-09-28 DIAGNOSIS — I129 Hypertensive chronic kidney disease with stage 1 through stage 4 chronic kidney disease, or unspecified chronic kidney disease: Secondary | ICD-10-CM | POA: Diagnosis not present

## 2016-09-28 DIAGNOSIS — I482 Chronic atrial fibrillation: Secondary | ICD-10-CM | POA: Diagnosis not present

## 2016-09-28 DIAGNOSIS — I6930 Unspecified sequelae of cerebral infarction: Secondary | ICD-10-CM | POA: Diagnosis not present

## 2016-09-28 DIAGNOSIS — D508 Other iron deficiency anemias: Secondary | ICD-10-CM | POA: Diagnosis not present

## 2016-09-28 DIAGNOSIS — G894 Chronic pain syndrome: Secondary | ICD-10-CM | POA: Diagnosis not present

## 2016-09-28 DIAGNOSIS — M109 Gout, unspecified: Secondary | ICD-10-CM | POA: Diagnosis not present

## 2016-09-28 DIAGNOSIS — F411 Generalized anxiety disorder: Secondary | ICD-10-CM | POA: Diagnosis not present

## 2016-10-06 ENCOUNTER — Ambulatory Visit (INDEPENDENT_AMBULATORY_CARE_PROVIDER_SITE_OTHER): Payer: Medicare Other | Admitting: *Deleted

## 2016-10-06 DIAGNOSIS — Z5181 Encounter for therapeutic drug level monitoring: Secondary | ICD-10-CM | POA: Diagnosis not present

## 2016-10-06 DIAGNOSIS — I481 Persistent atrial fibrillation: Secondary | ICD-10-CM | POA: Diagnosis not present

## 2016-10-06 DIAGNOSIS — I4819 Other persistent atrial fibrillation: Secondary | ICD-10-CM

## 2016-10-06 LAB — POCT INR: INR: 3

## 2016-10-14 DIAGNOSIS — H6122 Impacted cerumen, left ear: Secondary | ICD-10-CM | POA: Diagnosis not present

## 2016-10-14 DIAGNOSIS — H6692 Otitis media, unspecified, left ear: Secondary | ICD-10-CM | POA: Diagnosis not present

## 2016-10-19 DIAGNOSIS — M1711 Unilateral primary osteoarthritis, right knee: Secondary | ICD-10-CM | POA: Diagnosis not present

## 2016-10-27 ENCOUNTER — Ambulatory Visit (INDEPENDENT_AMBULATORY_CARE_PROVIDER_SITE_OTHER): Payer: Medicare Other | Admitting: *Deleted

## 2016-10-27 DIAGNOSIS — Z5181 Encounter for therapeutic drug level monitoring: Secondary | ICD-10-CM

## 2016-10-27 DIAGNOSIS — I4819 Other persistent atrial fibrillation: Secondary | ICD-10-CM

## 2016-10-27 DIAGNOSIS — M1711 Unilateral primary osteoarthritis, right knee: Secondary | ICD-10-CM | POA: Diagnosis not present

## 2016-10-27 DIAGNOSIS — I481 Persistent atrial fibrillation: Secondary | ICD-10-CM | POA: Diagnosis not present

## 2016-10-27 LAB — POCT INR: INR: 1.9

## 2016-11-04 ENCOUNTER — Other Ambulatory Visit: Payer: Self-pay | Admitting: Cardiology

## 2016-11-04 DIAGNOSIS — M1711 Unilateral primary osteoarthritis, right knee: Secondary | ICD-10-CM | POA: Diagnosis not present

## 2016-11-17 ENCOUNTER — Ambulatory Visit (INDEPENDENT_AMBULATORY_CARE_PROVIDER_SITE_OTHER): Payer: Medicare Other

## 2016-11-17 DIAGNOSIS — I481 Persistent atrial fibrillation: Secondary | ICD-10-CM

## 2016-11-17 DIAGNOSIS — Z5181 Encounter for therapeutic drug level monitoring: Secondary | ICD-10-CM

## 2016-11-17 DIAGNOSIS — I4819 Other persistent atrial fibrillation: Secondary | ICD-10-CM

## 2016-11-17 LAB — POCT INR: INR: 3.2

## 2016-12-08 ENCOUNTER — Ambulatory Visit (INDEPENDENT_AMBULATORY_CARE_PROVIDER_SITE_OTHER): Payer: Medicare Other

## 2016-12-08 DIAGNOSIS — I481 Persistent atrial fibrillation: Secondary | ICD-10-CM

## 2016-12-08 DIAGNOSIS — I4819 Other persistent atrial fibrillation: Secondary | ICD-10-CM

## 2016-12-08 DIAGNOSIS — Z5181 Encounter for therapeutic drug level monitoring: Secondary | ICD-10-CM

## 2016-12-08 LAB — POCT INR: INR: 3.9

## 2016-12-22 ENCOUNTER — Ambulatory Visit (INDEPENDENT_AMBULATORY_CARE_PROVIDER_SITE_OTHER): Payer: Medicare Other | Admitting: *Deleted

## 2016-12-22 DIAGNOSIS — I481 Persistent atrial fibrillation: Secondary | ICD-10-CM | POA: Diagnosis not present

## 2016-12-22 DIAGNOSIS — Z5181 Encounter for therapeutic drug level monitoring: Secondary | ICD-10-CM

## 2016-12-22 DIAGNOSIS — I4819 Other persistent atrial fibrillation: Secondary | ICD-10-CM

## 2016-12-22 LAB — POCT INR: INR: 2.5

## 2017-01-07 DIAGNOSIS — M1712 Unilateral primary osteoarthritis, left knee: Secondary | ICD-10-CM | POA: Diagnosis not present

## 2017-01-12 ENCOUNTER — Telehealth: Payer: Self-pay

## 2017-01-12 ENCOUNTER — Ambulatory Visit (INDEPENDENT_AMBULATORY_CARE_PROVIDER_SITE_OTHER): Payer: Medicare Other

## 2017-01-12 DIAGNOSIS — I10 Essential (primary) hypertension: Secondary | ICD-10-CM | POA: Diagnosis not present

## 2017-01-12 DIAGNOSIS — Z5181 Encounter for therapeutic drug level monitoring: Secondary | ICD-10-CM | POA: Diagnosis not present

## 2017-01-12 DIAGNOSIS — Z23 Encounter for immunization: Secondary | ICD-10-CM | POA: Diagnosis not present

## 2017-01-12 DIAGNOSIS — I481 Persistent atrial fibrillation: Secondary | ICD-10-CM

## 2017-01-12 DIAGNOSIS — M19049 Primary osteoarthritis, unspecified hand: Secondary | ICD-10-CM | POA: Diagnosis not present

## 2017-01-12 DIAGNOSIS — N183 Chronic kidney disease, stage 3 (moderate): Secondary | ICD-10-CM | POA: Diagnosis not present

## 2017-01-12 DIAGNOSIS — I4819 Other persistent atrial fibrillation: Secondary | ICD-10-CM

## 2017-01-12 LAB — POCT INR: INR: 3.5

## 2017-01-12 NOTE — Telephone Encounter (Signed)
Will route to Margaretmary Dys, PharmD to set up and schedule.

## 2017-01-12 NOTE — Telephone Encounter (Signed)
[address] schedule full until October, scheduled pt at [facility] office this Thursday for BP management.

## 2017-01-12 NOTE — Telephone Encounter (Signed)
Pt seen in Coumadin Clinic today, reports BP 201/174 at 1am when awoke with headache, then took again at 4am because HA persisted and unable to sleep 184/150, this am BP 150/124.  BP checked today in office 150/112.  Pt reports for the past 2 weeks BP has been running consistently higher diastolic pressure always above 100.    Pt states he saw Nada Boozer, NP in March for feet swelling and Amlodipine was d/c.  Pt is currently on Metoprolol  BID and Clonidine 0.2mg  BID.   Pt is due to follow-up with Dr Anne Fu in November.  Please advise if pt needs to adjust his BP medications prior to appt or make sooner appt.  Thanks

## 2017-01-12 NOTE — Telephone Encounter (Signed)
Please set up with HTN clinic, pharmacy.  Donato Schultz, MD

## 2017-01-14 ENCOUNTER — Ambulatory Visit (INDEPENDENT_AMBULATORY_CARE_PROVIDER_SITE_OTHER): Payer: Medicare Other | Admitting: Pharmacist

## 2017-01-14 VITALS — BP 132/82 | HR 84

## 2017-01-14 DIAGNOSIS — I10 Essential (primary) hypertension: Secondary | ICD-10-CM | POA: Diagnosis not present

## 2017-01-14 MED ORDER — CLONIDINE HCL 0.1 MG PO TABS: 0.1000 mg | ORAL_TABLET | Freq: Every day | ORAL | 11 refills | Status: DC

## 2017-01-14 NOTE — Progress Notes (Signed)
Patient ID: Ralph Sullivan                 DOB: 05-23-1945                      MRN: [id number]     HPI: Ralph Sullivan is a 71 y.o. male referred by Dr. Anne Fu to HTN clinic. PMH includes permanent atrial fibrillation, right bundle branch block, normal EF, gout, stroke in 2015, CKD-III with baseline SCr of 1.6, and hypertension.  During last office visit with Nada Boozer NP, amlodipine  was discontinued due to lower extremity edema. On 01/12/2017 patient called to report BP home readings of 201/174 with symptoms of HA and insomnia.  Patient presents this morning to HTN clinic and denies dizziness, headaches, chest pain, blurry vision or swelling. Reports a "good" BP readings this morning but no records of home BP readings are available today for assess.  Current HTN meds:  Clonidine 0.2mg  twice daily (7am - 6:30pm) Metoprolol  twice daily(7am - 6:30pm)  Previously tried:  Amlodipine  - low extremity edema Lisinopril  daily Losartan  daily  BP goal: 130/80  Social History: quit smoking >40 years ago, denies alcohol intake or illicit drugs  Diet: mainly home cooked meals, more vegetables than before  Exercise: minimal due to back and hip pain  Home BP readings: none available today; patient reports normal reading this morning  Wt Readings from Last 3 Encounters:  09/09/16 195 lb 6.4 oz (88.6 kg)  07/16/16 187 lb 14.4 oz (85.2 kg)  06/26/16 200 lb (90.7 kg)   BP Readings from Last 3 Encounters:  01/14/17 132/82  09/09/16 (!) 138/104  07/17/16 110/72   Pulse Readings from Last 3 Encounters:  01/14/17 84  09/09/16 90  07/17/16 76    Renal function: CrCl cannot be calculated (Patient's most recent lab result is older than the maximum 21 days allowed.).  Past Medical History:  Diagnosis Date  . Atrial fibrillation (HCC) 02/08/2013   CHADS-VAS - 2 (AGE, HTN). On anticoagulation. Holter 6/14 - 2.3 sec pause. Avg HR 87. Rate control   . Bifascicular block  03/28/2013  . Bilateral congenital hammer toes   . Chronic anticoagulation 03/28/2013  . Chronic kidney disease, stage III (moderate) 03/28/2013  . 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 Gout   . Hyperhidrosis 03/28/2013  . Obesity, unspecified 03/28/2013  . Rhabdomyolysis 09/2015  . Stroke (cerebrum) [facility])    on MRI 04/15/2014 Lacunar    Current Outpatient Prescriptions on File Prior to Visit  Medication Sig Dispense Refill  . allopurinol (ZYLOPRIM) 300 MG tablet Take 300 mg by mouth daily.     Marland Kitchen buPROPion (WELLBUTRIN XL) 150 MG 24 hr tablet Take 150 mg by mouth daily.    . CHROMIUM-CINNAMON PO Take 200 mg by mouth at bedtime.     . cloNIDine (CATAPRES) 0.2 MG tablet Take 0.2 mg by mouth 2 (two) times daily.     Marland Kitchen escitalopram (LEXAPRO) 10 MG tablet Take 10 mg by mouth daily.    . ferrous sulfate 325 (65 FE) MG tablet Take 325 mg by mouth daily with breakfast.    . fluticasone (FLONASE) 50 MCG/ACT nasal spray Place 2 sprays into both nostrils daily.     . metoprolol (LOPRESSOR) 100 MG tablet Take 200 mg by mouth 2 (two) times daily.     . Omega-3 Fatty Acids (OMEGA 3 PO)  Take 1 capsule by mouth 2 (two) times daily.     . vitamin E 400 UNIT capsule Take 400 Units by mouth daily.    Marland Kitchen warfarin (COUMADIN) 5 MG tablet Take 2.5 mg by mouth daily.    Marland Kitchen warfarin (COUMADIN) 5 MG tablet TAKE AS DIRECTED BY  COUMADIN  CLINIC 40 tablet 3   No current facility-administered medications on file prior to visit.     Allergies  Allergen Reactions  . Duloxetine Other (See Comments)    More nervous  . Sulfur Nausea And Vomiting    Blood pressure 132/82, pulse 84.  HTN (hypertension) Blood pressure today better controlled. Patient also reports improved reading in last 2 days but no records for home BP readings available for assessment. Will changed his clonidine to 0.2mg  every morning and evening plus 0.1mg  at lunch time. Patient instructed  to monitor BP twice daily and keep records to bring to next office visit in 3 weeks.  Plan to change clonidine 0.2mg  path if good repose and no ADRs obtained with TID administration. Encouraged to call clinic with any BP questions.   Shaneca Orne Rodriguez-Guzman PharmD, BCPS, CPP [facility] [address] [place] [address] 01/14/2017 9:27 AM

## 2017-01-14 NOTE — Assessment & Plan Note (Signed)
Blood pressure today better controlled. Patient also reports improved reading in last 2 days but no records for home BP readings available for assessment. Will changed his clonidine to 0.2mg  every morning and evening plus 0.1mg  at lunch time. Patient instructed to monitor BP twice daily and keep records to bring to next office visit in 3 weeks.  Plan to change clonidine 0.2mg  path if good repose and no ADRs obtained with TID administration. Encouraged to call clinic with any BP questions.

## 2017-01-14 NOTE — Patient Instructions (Addendum)
Return for a  follow up appointment in 3 weeks   Your blood pressure today is 132/82 pulse 84  Check your blood pressure at home daily (if able) and keep record of the readings.  Take your BP meds as follows: *Increase clonidine to 0.2mg  every morning, 0.1mg  at lunch time, 0.2mg  every evening* *Continue all other medication as previously prescribed*  Bring all of your meds, your BP cuff and your record of home blood pressures to your next appointment.  Exercise as you're able, try to walk approximately 30 minutes per day.  Keep salt intake to a minimum, especially watch canned and prepared boxed foods.  Eat more fresh fruits and vegetables and fewer canned items.  Avoid eating in fast food restaurants.    HOW TO TAKE YOUR BLOOD PRESSURE: . Rest 5 minutes before taking your blood pressure. .  Don't smoke or drink caffeinated beverages for at least 30 minutes before. . Take your blood pressure before (not after) you eat. . Sit comfortably with your back supported and both feet on the floor (don't cross your legs). . Elevate your arm to heart level on a table or a desk. . Use the proper sized cuff. It should fit smoothly and snugly around your bare upper arm. There should be enough room to slip a fingertip under the cuff. The bottom edge of the cuff should be 1 inch above the crease of the elbow. . Ideally, take 3 measurements at one sitting and record the average.

## 2017-01-15 DIAGNOSIS — M1712 Unilateral primary osteoarthritis, left knee: Secondary | ICD-10-CM | POA: Diagnosis not present

## 2017-01-22 DIAGNOSIS — M1712 Unilateral primary osteoarthritis, left knee: Secondary | ICD-10-CM | POA: Diagnosis not present

## 2017-01-26 ENCOUNTER — Ambulatory Visit (INDEPENDENT_AMBULATORY_CARE_PROVIDER_SITE_OTHER): Payer: Medicare Other | Admitting: Pharmacist

## 2017-01-26 DIAGNOSIS — I481 Persistent atrial fibrillation: Secondary | ICD-10-CM | POA: Diagnosis not present

## 2017-01-26 DIAGNOSIS — Z5181 Encounter for therapeutic drug level monitoring: Secondary | ICD-10-CM | POA: Diagnosis not present

## 2017-01-26 DIAGNOSIS — I4819 Other persistent atrial fibrillation: Secondary | ICD-10-CM

## 2017-01-26 LAB — POCT INR: INR: 2.2

## 2017-02-04 DIAGNOSIS — M1A0411 Idiopathic chronic gout, right hand, with tophus (tophi): Secondary | ICD-10-CM | POA: Diagnosis not present

## 2017-02-04 DIAGNOSIS — G5621 Lesion of ulnar nerve, right upper limb: Secondary | ICD-10-CM | POA: Diagnosis not present

## 2017-02-04 DIAGNOSIS — M1A0421 Idiopathic chronic gout, left hand, with tophus (tophi): Secondary | ICD-10-CM | POA: Diagnosis not present

## 2017-02-04 DIAGNOSIS — G5622 Lesion of ulnar nerve, left upper limb: Secondary | ICD-10-CM | POA: Diagnosis not present

## 2017-02-08 DIAGNOSIS — G5622 Lesion of ulnar nerve, left upper limb: Secondary | ICD-10-CM | POA: Diagnosis not present

## 2017-02-08 DIAGNOSIS — G5621 Lesion of ulnar nerve, right upper limb: Secondary | ICD-10-CM | POA: Diagnosis not present

## 2017-02-10 NOTE — Progress Notes (Signed)
Patient ID: Ralph ScarceSammy K Mapps                 DOB: 05/14/1945                      MRN: [id number]     HPI: Ralph Sullivan is a 71 y.o. male referred by Dr. Anne FuSkains to HTN clinic who presents today for follow up. PMH includes permanent atrial fibrillation, right bundle branch block, gout, stroke in 2015, CKD-III with baseline SCr of 1.6, and HTN. At his last visit, clonidine 0.1mg  was added at lunch for TID dosing. LEE resolved after discontinuation of amlodipine 10mg  daily.  Patient presents this morning to HTN clinic and denies dizziness, headaches, chest pain, blurry vision or swelling. He is tolerating his increased dose of clonidine well. He checks his BP at home twice each day in the AM and PM. Readings range 110-151/71-112. Most readings range 120-140s/70-80s. He took his BP medications this morning about an hour ago.  Current HTN meds:  Clonidine 0.2mg  in AM, 0.1mg  at lunch, and 0.2mg  in PM Metoprolol 200mg  twice daily (7am, 6:30pm)  Previously tried:  Amlodipine 10mg  - low extremity edema Lisinopril 40mg  daily Losartan 50mg  daily  BP goal: < 130/7380mmHg  Social History: quit smoking >40 years ago, denies alcohol intake or illicit drugs  Diet: Mainly home cooked meals, more vegetables than before  Exercise: Minimal due to back and hip pain  Home BP readings: None available today; patient reports normal reading this morning  Wt Readings from Last 3 Encounters:  09/09/16 195 lb 6.4 oz (88.6 kg)  07/16/16 187 lb 14.4 oz (85.2 kg)  06/26/16 200 lb (90.7 kg)   BP Readings from Last 3 Encounters:  01/14/17 132/82  09/09/16 (!) 138/104  07/17/16 110/72   Pulse Readings from Last 3 Encounters:  01/14/17 84  09/09/16 90  07/17/16 76    Renal function: CrCl cannot be calculated (Patient's most recent lab result is older than the maximum 21 days allowed.).  Past Medical History:  Diagnosis Date  . Atrial fibrillation (HCC) 02/08/2013   CHADS-VAS - 2 (AGE, HTN). On  anticoagulation. Holter 6/14 - 2.3 sec pause. Avg HR 87. Rate control   . Bifascicular block 03/28/2013  . Bilateral congenital hammer toes   . Chronic anticoagulation 03/28/2013  . Chronic kidney disease, stage III (moderate) 03/28/2013  . 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 Gout   . Hyperhidrosis 03/28/2013  . Obesity, unspecified 03/28/2013  . Rhabdomyolysis 09/2015  . Stroke (cerebrum) [facility](HCC)    on MRI 04/15/2014 Lacunar    Current Outpatient Prescriptions on File Prior to Visit  Medication Sig Dispense Refill  . allopurinol (ZYLOPRIM) 300 MG tablet Take 300 mg by mouth daily.     Marland Kitchen. buPROPion (WELLBUTRIN XL) 150 MG 24 hr tablet Take 150 mg by mouth daily.    . CHROMIUM-CINNAMON PO Take 200 mg by mouth at bedtime.     . cloNIDine (CATAPRES) 0.1 MG tablet Take 1 tablet (0.1 mg total) by mouth daily. At at lunch time 30 tablet 11  . cloNIDine (CATAPRES) 0.2 MG tablet Take 0.2 mg by mouth 2 (two) times daily.     Marland Kitchen. escitalopram (LEXAPRO) 10 MG tablet Take 10 mg by mouth daily.    . ferrous sulfate 325 (65 FE) MG tablet Take 325 mg by mouth daily with breakfast.    . fluticasone (FLONASE) 50  MCG/ACT nasal spray Place 2 sprays into both nostrils daily.     . metoprolol (LOPRESSOR) 100 MG tablet Take 200 mg by mouth 2 (two) times daily.     . Omega-3 Fatty Acids (OMEGA 3 PO) Take 1 capsule by mouth 2 (two) times daily.     . vitamin E 400 UNIT capsule Take 400 Units by mouth daily.    Marland Kitchen warfarin (COUMADIN) 5 MG tablet Take 2.5 mg by mouth daily.    Marland Kitchen warfarin (COUMADIN) 5 MG tablet TAKE AS DIRECTED BY  COUMADIN  CLINIC 40 tablet 3   No current facility-administered medications on file prior to visit.     Allergies  Allergen Reactions  . Duloxetine Other (See Comments)    More nervous  . Sulfur Nausea And Vomiting     Assessment/Plan:  1. Hypertension - BP is very close to goal today <130/54mmHg. Will continue clonidine  0.2mg  AM, 0.1mg  at lunch, and 0.2mg  PM and metoprolol tartrate 200mg  BID. Pt will continue to monitor his BP at home and keep f/u with Dr Anne Fu in November. F/u in HTN clinic as needed.   Megan E. Supple, PharmD, CPP, BCACP [facility] [address], [place], [place] [address] Phone: [phone]; Fax: [phone] 02/11/2017 8:23 AM

## 2017-02-11 ENCOUNTER — Ambulatory Visit (INDEPENDENT_AMBULATORY_CARE_PROVIDER_SITE_OTHER): Payer: Medicare Other | Admitting: Pharmacist

## 2017-02-11 VITALS — BP 126/84 | HR 81

## 2017-02-11 DIAGNOSIS — I4819 Other persistent atrial fibrillation: Secondary | ICD-10-CM

## 2017-02-11 DIAGNOSIS — I10 Essential (primary) hypertension: Secondary | ICD-10-CM | POA: Diagnosis not present

## 2017-02-11 DIAGNOSIS — I481 Persistent atrial fibrillation: Secondary | ICD-10-CM | POA: Diagnosis not present

## 2017-02-11 DIAGNOSIS — Z5181 Encounter for therapeutic drug level monitoring: Secondary | ICD-10-CM | POA: Diagnosis not present

## 2017-02-11 LAB — POCT INR: INR: 1.8

## 2017-02-11 NOTE — Patient Instructions (Addendum)
It was nice to see you today - your blood pressure was excellent  Continue to take your metoprolol 200mg  twice daily  Continue to take your clonidine 0.2mg  in the morning, 0.1mg  at lunch, and 0.2mg  in the evening  Keep your follow up with Dr Anne FuSkains in November

## 2017-02-15 DIAGNOSIS — G5622 Lesion of ulnar nerve, left upper limb: Secondary | ICD-10-CM | POA: Diagnosis not present

## 2017-02-15 DIAGNOSIS — G5621 Lesion of ulnar nerve, right upper limb: Secondary | ICD-10-CM | POA: Diagnosis not present

## 2017-03-08 ENCOUNTER — Ambulatory Visit (INDEPENDENT_AMBULATORY_CARE_PROVIDER_SITE_OTHER): Payer: Medicare Other | Admitting: *Deleted

## 2017-03-08 DIAGNOSIS — M1711 Unilateral primary osteoarthritis, right knee: Secondary | ICD-10-CM | POA: Diagnosis not present

## 2017-03-08 DIAGNOSIS — Z5181 Encounter for therapeutic drug level monitoring: Secondary | ICD-10-CM | POA: Diagnosis not present

## 2017-03-08 DIAGNOSIS — I481 Persistent atrial fibrillation: Secondary | ICD-10-CM | POA: Diagnosis not present

## 2017-03-08 DIAGNOSIS — I4819 Other persistent atrial fibrillation: Secondary | ICD-10-CM

## 2017-03-08 LAB — POCT INR: INR: 1.5

## 2017-03-08 NOTE — Progress Notes (Signed)
03/08/17: INR 1.5 Instructions: Take 5mg  today, then start taking 5mg  daily except 2.5mg  on Sundays and Thursdays.  Recheck INR in 1 week.

## 2017-03-16 ENCOUNTER — Ambulatory Visit (INDEPENDENT_AMBULATORY_CARE_PROVIDER_SITE_OTHER): Payer: Medicare Other | Admitting: *Deleted

## 2017-03-16 DIAGNOSIS — Z5181 Encounter for therapeutic drug level monitoring: Secondary | ICD-10-CM

## 2017-03-16 DIAGNOSIS — I481 Persistent atrial fibrillation: Secondary | ICD-10-CM | POA: Diagnosis not present

## 2017-03-16 DIAGNOSIS — I4819 Other persistent atrial fibrillation: Secondary | ICD-10-CM

## 2017-03-16 DIAGNOSIS — I4891 Unspecified atrial fibrillation: Secondary | ICD-10-CM

## 2017-03-16 LAB — POCT INR: INR: 2.4

## 2017-03-16 NOTE — Patient Instructions (Addendum)
Continue  taking 5mg  daily except 2.5mg  on Sundays and Thursdays.  Recheck INR in 1 week. Instructed to call if placed on any new medications [phone].  Will finish prednisone on Nov 25th instructed to do couple extra servings of greens until finishes Prednisone

## 2017-03-22 DIAGNOSIS — Z1211 Encounter for screening for malignant neoplasm of colon: Secondary | ICD-10-CM | POA: Diagnosis not present

## 2017-03-25 ENCOUNTER — Encounter: Payer: Self-pay | Admitting: Cardiology

## 2017-03-25 ENCOUNTER — Ambulatory Visit (INDEPENDENT_AMBULATORY_CARE_PROVIDER_SITE_OTHER): Payer: Medicare Other | Admitting: *Deleted

## 2017-03-25 ENCOUNTER — Ambulatory Visit (INDEPENDENT_AMBULATORY_CARE_PROVIDER_SITE_OTHER): Payer: Medicare Other | Admitting: Cardiology

## 2017-03-25 VITALS — BP 130/84 | HR 50 | Ht 70.0 in | Wt 190.1 lb

## 2017-03-25 DIAGNOSIS — Z7901 Long term (current) use of anticoagulants: Secondary | ICD-10-CM | POA: Diagnosis not present

## 2017-03-25 DIAGNOSIS — I77810 Thoracic aortic ectasia: Secondary | ICD-10-CM | POA: Diagnosis not present

## 2017-03-25 DIAGNOSIS — Z5181 Encounter for therapeutic drug level monitoring: Secondary | ICD-10-CM | POA: Diagnosis not present

## 2017-03-25 DIAGNOSIS — I481 Persistent atrial fibrillation: Secondary | ICD-10-CM | POA: Diagnosis not present

## 2017-03-25 DIAGNOSIS — I482 Chronic atrial fibrillation: Secondary | ICD-10-CM | POA: Diagnosis not present

## 2017-03-25 DIAGNOSIS — I4819 Other persistent atrial fibrillation: Secondary | ICD-10-CM

## 2017-03-25 DIAGNOSIS — I4821 Permanent atrial fibrillation: Secondary | ICD-10-CM

## 2017-03-25 LAB — POCT INR: INR: 1.9

## 2017-03-25 NOTE — Patient Instructions (Signed)
Today take 5mg , then continue  taking 5mg  daily except 2.5mg  on Sundays and Thursdays.  Recheck INR in 2 weeks. Instructed to call if placed on any new medications [phone].

## 2017-03-25 NOTE — Patient Instructions (Signed)

## 2017-03-25 NOTE — Progress Notes (Signed)
[address] [place], [place]  [address] Phone: [phone] Fax:  [phone]  Date:  03/25/2017   ID:  Ralph Sullivan, DOB 07/01/1945, MRN [id number]  PCP:  Ralph Sullivan, Rupashree, MD   History of Present Illness: Ralph Sullivan is a 71 y.o. male (husband of Ralph Sullivan) who presented to me in June of 2014 with return of atrial fibrillation. Heart rate 79 on EKG with right bundle branch block, left anterior fascicular block, bifascicular block.  In 2009, he converted with amiodarone. Amiodarone was used because of moderate LVH seen on prior echocardiogram, normal EF. In 2011 went back in afib (off of amiodarone). He believes he has been in atrial fibrillation over the past 3 years. We have been maintaining rate control. He has not had any strokelike symptoms. He does battled with gout occasionally. No alcohol intake. No recent fevers. HR was slowed in NSR.  For the last few years however he has been in atrial fibrillation with good rate control.   An echocardiogram 6/14 demonstrated aortic root size 4.5 cm with mild LVH, normal EF. On repeat 6/15 and in 2017 - 4.2cm  Spirometry was reassuring. Has lost weight.   SOB weakness in legs, ABIs were normal. Obviously still grieving the loss of his wife Ralph Sullivan. He has been battling gout as well. Periodic use of prednisone. Mild left ankle swelling.  5/60/18-his ankle swelling has improved since stopping the amlodipine. Still feeling disequilibrium. Physical therapy. No chest pain, no significant shortness of breath. No syncope.  03/25/17 - BP high at night 170/115.Legs hurt all the time. ABI were normal.  He fell, hurt his right wrist.  No syncope.  Mechanical fall from disequilibrium.  No chest pain syncope.   Wt Readings from Last 3 Encounters:  03/25/17 190 lb 1.9 oz (86.2 kg)  09/09/16 195 lb 6.4 oz (88.6 kg)  07/16/16 187 lb 14.4 oz (85.2 kg)     Past Medical History:  Diagnosis Date  . Atrial fibrillation (HCC)  02/08/2013   CHADS-VAS - 2 (AGE, HTN). On anticoagulation. Holter 6/14 - 2.3 sec pause. Avg HR 87. Rate control   . Bifascicular block 03/28/2013  . Bilateral congenital hammer toes   . Chronic anticoagulation 03/28/2013  . Chronic kidney disease, stage III (moderate) (HCC) 03/28/2013  . 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 Gout   . Hyperhidrosis 03/28/2013  . Obesity, unspecified 03/28/2013  . Rhabdomyolysis 09/2015  . Stroke (cerebrum) [facility](HCC)    on MRI 04/15/2014 Lacunar    Past Surgical History:  Procedure Laterality Date  . CATARACT EXTRACTION    . HERNIA REPAIR    . INGUINAL HERNIA REPAIR  07/16/2016  . INGUINAL HERNIA REPAIR Left 07/16/2016   Procedure: LAPAROSCOPIC INGUINAL HERNIA REPAIR;  Surgeon: De BlanchLuke Aaron Kinsinger, MD;  [facility];  Service: General;  Laterality: Left;  . INSERTION OF MESH Left 07/16/2016   Procedure: INSERTION OF MESH;  Surgeon: Rodman PickleLuke Aaron Kinsinger, MD;  [facility];  Service: General;  Laterality: Left;  . KIDNEY STONE SURGERY      Current Outpatient Medications  Medication Sig Dispense Refill  . allopurinol (ZYLOPRIM) 300 MG tablet Take 300 mg by mouth daily.     Marland Kitchen. buPROPion (WELLBUTRIN XL) 150 MG 24 hr tablet Take 150 mg by mouth daily.    . CHROMIUM-CINNAMON PO Take 200 mg by mouth at bedtime.     .Marland Kitchen  cloNIDine (CATAPRES) 0.1 MG tablet Take 0.1 mg by mouth daily. At lunch.    . cloNIDine (CATAPRES) 0.2 MG tablet Take 0.2 mg by mouth 2 (two) times daily.    Marland Kitchen escitalopram (LEXAPRO) 10 MG tablet Take 10 mg by mouth daily.    . ferrous sulfate 325 (65 FE) MG tablet Take 325 mg by mouth daily with breakfast.    . fluticasone (FLONASE) 50 MCG/ACT nasal spray Place 2 sprays into both nostrils daily.     . metoprolol (LOPRESSOR) 100 MG tablet Take 200 mg by mouth 2 (two) times daily.     . Omega-3 Fatty Acids (OMEGA 3 PO) Take 1 capsule by mouth 2 (two) times daily.     . predniSONE  (DELTASONE) 5 MG tablet Take 5 mg by mouth daily. FOR 6 DAYS    . vitamin E 400 UNIT capsule Take 400 Units by mouth daily.    Marland Kitchen warfarin (COUMADIN) 5 MG tablet Take 2.5 mg by mouth daily.    Marland Kitchen warfarin (COUMADIN) 5 MG tablet TAKE AS DIRECTED BY  COUMADIN  CLINIC 40 tablet 3   No current facility-administered medications for this visit.     Allergies:    Allergies  Allergen Reactions  . Duloxetine Other (See Comments)    More nervous  . Sulfur Nausea And Vomiting    Social History:  The patient  reports that he quit smoking about 47 years ago. he has never used smokeless tobacco. He reports that he does not drink alcohol or use drugs.   ROS:  Please see the history of present illness.  Unless above all other review of systems negative.   PHYSICAL EXAM: VS:  BP 130/84   Pulse (!) 50   Ht 5\' 10"  (1.778 m)   Wt 190 lb 1.9 oz (86.2 kg)   SpO2 90%   BMI 27.28 kg/m  Well nourished, well developed, in no acute distress  HEENT: normal  Neck: no JVD  Cardiac:  normal S1, S2; irreg irreg; no murmur  Lungs:  clear to auscultation bilaterally, no wheezing, rhonchi or rales  Abd: soft, nontender, no hepatomegaly Overweight Ext: no edema  Skin: warm and dry  palpable distal pulses  Neuro: no focal abnormalities noted  EKG:   today 05/02/15-atrial fibrillation heart rate 88 bpm, right bundle branch block, left anterior fascicular block, bifascicular block. 04/10/14-right bundle branch block, atrial fibrillation, 79, left anterior fascicular block, bifascicular block-no change from prior Atrial fibrillation heart rate 69, right bundle branch block, left anterior specific block, bifascicular block     ABIs 05/10/15-normal  ECHO 05/10/15: - Left ventricle: The cavity size was normal. There was mild  hypertrophy of the posterior wall and moderate hypertrophy of the  septal wall. Systolic function was at the lower limits of normal.  The estimated ejection fraction was in the range of 50% to  55%. - Left atrium: The atrium was severely dilated. - Pulmonary arteries: The main pulmonary artery was normal-sized.  Systolic pressure was within the normal range, estimated to be 28  mm Hg. - Aortic root-42 mm-stable - Inferior vena cava: The vessel was normal in size. The  respirophasic diameter changes were in the normal range (= 50%),  consistent with normal central venous pressure.  Labs: LDL 57, HDL 63, hemoglobin A1c 6.4, hemoglobin 12.7, potassium 3.9 on 04/07/16  ASSESSMENT AND PLAN:   Permanent atrial fibrillation  - Overall doing well with rate control with high-dose metoprolol 200 mg twice a day.  -  Prior Holter monitor showed a 2.3 second pause. Stable.  No syncope.  His fall was secondary to losing his balance.  Fatigue  - Likely multifactorial. He is getting iron infusion from Dr. Hyman HopesWebb.  - Continue to encourage movement, exercise.  - He has lost weight. His TSH has been normal in the past. Remember, he lost his wife last year, has been going through quite a bit.  No significant changes  Disequilibrium  - This is his main complaint today. Utilizing walker for stability. Suffered fall, hurt left shoulder.  -Previous physical therapy for this.  -He is not have any evidence of orthostatic hypotension.  -I asked him to discuss this further with his primary doctor.  Essential hypertension  -Overall appreciate Megan Supple's assistance.  Continue with current regimen.  Dilated aortic root  - 42 mm. Stable.  No change  Chronic kidney disease stage III  - Dr. Hyman HopesWebb has been monitoring.  No changes made.    Chronic anticoagulation  - Coumadin. We are watching in clinic.   3418-month follow-up  Signed, Donato SchultzMark Skains, MD [facility]  03/25/2017 9:32 AM

## 2017-04-01 ENCOUNTER — Emergency Department (HOSPITAL_COMMUNITY): Payer: Medicare Other

## 2017-04-01 ENCOUNTER — Encounter (HOSPITAL_COMMUNITY): Payer: Self-pay | Admitting: *Deleted

## 2017-04-01 ENCOUNTER — Emergency Department (HOSPITAL_COMMUNITY)
Admission: EM | Admit: 2017-04-01 | Discharge: 2017-04-01 | Disposition: A | Discharge status: Home / Self Care | Payer: Medicare Other | Attending: Emergency Medicine | Admitting: Emergency Medicine

## 2017-04-01 ENCOUNTER — Other Ambulatory Visit: Payer: Self-pay

## 2017-04-01 DIAGNOSIS — R509 Fever, unspecified: Secondary | ICD-10-CM | POA: Diagnosis present

## 2017-04-01 DIAGNOSIS — Z9181 History of falling: Secondary | ICD-10-CM | POA: Diagnosis not present

## 2017-04-01 DIAGNOSIS — R9431 Abnormal electrocardiogram [ECG] [EKG]: Secondary | ICD-10-CM | POA: Diagnosis not present

## 2017-04-01 DIAGNOSIS — N3000 Acute cystitis without hematuria: Secondary | ICD-10-CM | POA: Insufficient documentation

## 2017-04-01 DIAGNOSIS — I4891 Unspecified atrial fibrillation: Secondary | ICD-10-CM | POA: Diagnosis not present

## 2017-04-01 DIAGNOSIS — Z8673 Personal history of transient ischemic attack (TIA), and cerebral infarction without residual deficits: Secondary | ICD-10-CM | POA: Insufficient documentation

## 2017-04-01 DIAGNOSIS — R51 Headache: Secondary | ICD-10-CM | POA: Diagnosis not present

## 2017-04-01 DIAGNOSIS — Z79899 Other long term (current) drug therapy: Secondary | ICD-10-CM | POA: Insufficient documentation

## 2017-04-01 DIAGNOSIS — Z7901 Long term (current) use of anticoagulants: Secondary | ICD-10-CM | POA: Insufficient documentation

## 2017-04-01 DIAGNOSIS — Z87891 Personal history of nicotine dependence: Secondary | ICD-10-CM | POA: Insufficient documentation

## 2017-04-01 DIAGNOSIS — R531 Weakness: Secondary | ICD-10-CM | POA: Diagnosis not present

## 2017-04-01 DIAGNOSIS — S0990XA Unspecified injury of head, initial encounter: Secondary | ICD-10-CM | POA: Diagnosis not present

## 2017-04-01 DIAGNOSIS — I129 Hypertensive chronic kidney disease with stage 1 through stage 4 chronic kidney disease, or unspecified chronic kidney disease: Secondary | ICD-10-CM | POA: Diagnosis not present

## 2017-04-01 DIAGNOSIS — N183 Chronic kidney disease, stage 3 (moderate): Secondary | ICD-10-CM | POA: Diagnosis not present

## 2017-04-01 LAB — URINALYSIS, ROUTINE W REFLEX MICROSCOPIC
Bacteria, UA: NONE SEEN
Bilirubin Urine: NEGATIVE
Glucose, UA: NEGATIVE mg/dL
Ketones, ur: 5 mg/dL — AB
Nitrite: POSITIVE — AB
PROTEIN: 100 mg/dL — AB
SPECIFIC GRAVITY, URINE: 1.016 (ref 1.005–1.030)
pH: 5 (ref 5.0–8.0)

## 2017-04-01 LAB — BASIC METABOLIC PANEL
Anion gap: 13 (ref 5–15)
BUN: 20 mg/dL (ref 6–20)
CALCIUM: 9.6 mg/dL (ref 8.9–10.3)
CHLORIDE: 99 mmol/L — AB (ref 101–111)
CO2: 21 mmol/L — AB (ref 22–32)
CREATININE: 1.68 mg/dL — AB (ref 0.61–1.24)
GFR calc Af Amer: 46 mL/min — ABNORMAL LOW (ref >60)
GFR calc non Af Amer: 40 mL/min — ABNORMAL LOW (ref >60)
GLUCOSE: 92 mg/dL (ref 65–99)
Potassium: 4.4 mmol/L (ref 3.5–5.1)
Sodium: 133 mmol/L — ABNORMAL LOW (ref 135–145)

## 2017-04-01 LAB — PROTIME-INR
INR: 2.73
PROTHROMBIN TIME: 28.7 s — AB (ref 11.4–15.2)

## 2017-04-01 LAB — CBC
HCT: 44.2 % (ref 39.0–52.0)
HEMOGLOBIN: 14.6 g/dL (ref 13.0–17.0)
MCH: 30.9 pg (ref 26.0–34.0)
MCHC: 33 g/dL (ref 30.0–36.0)
MCV: 93.4 fL (ref 78.0–100.0)
PLATELETS: 177 10*3/uL (ref 150–400)
RBC: 4.73 MIL/uL (ref 4.22–5.81)
RDW: 14.3 % (ref 11.5–15.5)
WBC: 10.2 10*3/uL (ref 4.0–10.5)

## 2017-04-01 MED ORDER — CEPHALEXIN 500 MG PO CAPS: 500.0000 mg | ORAL_CAPSULE | Freq: Two times a day (BID) | ORAL | 0 refills | Status: DC

## 2017-04-01 MED ORDER — DEXTROSE 5 % IV SOLN
1.0000 g | Freq: Once | INTRAVENOUS | Status: AC
  Administered 2017-04-01: 1 g via INTRAVENOUS
  Filled 2017-04-01: qty 10

## 2017-04-01 MED ORDER — SODIUM CHLORIDE 0.9 % IV BOLUS (SEPSIS)
500.0000 mL | Freq: Once | INTRAVENOUS | Status: AC
  Administered 2017-04-01: 500 mL via INTRAVENOUS

## 2017-04-01 NOTE — ED Triage Notes (Signed)
To ED for eval of fever and cold symptoms over the past couple of days. States last night we was in restroom when he lost balance and slipped to the floor. Friend with pt states they called EMS who wanted pt to come to ED but pt refused. States they told him his fever was 'high'. [facility] and told to come to ED due to cardiac hx. No sob, no cp. Pt states 'I feel 100% better today'. States his 'fever broke last night and now I just feel weak'.

## 2017-04-01 NOTE — Discharge Instructions (Signed)
Take the antibiotics as prescribed, make sure to stay hydrated, follow-up with your primary care doctor next week to make sure you are improving.  Return to the emergency room for worsening symptoms

## 2017-04-01 NOTE — ED Provider Notes (Signed)
[facility] EMERGENCY DEPARTMENT Provider Note   CSN: [id number] Arrival date & time: 04/01/17  1048     History   Chief Complaint Chief Complaint  Patient presents with  . Weakness    HPI Ralph Sullivan is a 71 y.o. male.  HPI Pt has been having trouble with fever the last couple of days.  Up to 102.    He has been coughing and sneezing.  He also has an earache on the left side.  He has had a couple episodes of vomiting.  NO diarrhea.  APpetite has not been very good.     No dysuria.  Pt had an episode getting off the commode where he felt lightheaded and his feet slipped out from him.   Does not think he hit his head but he does have a headache and did fall.   Family states pt was confused last night.  Thought that a dead family member had visited him.   Past Medical History:  Diagnosis Date  . Atrial fibrillation (HCC) 02/08/2013   CHADS-VAS - 2 (AGE, HTN). On anticoagulation. Holter 6/14 - 2.3 sec pause. Avg HR 87. Rate control   . Bifascicular block 03/28/2013  . Bilateral congenital hammer toes   . Chronic anticoagulation 03/28/2013  . Chronic kidney disease, stage III (moderate) (HCC) 03/28/2013  . 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 Gout   . Hyperhidrosis 03/28/2013  . Obesity, unspecified 03/28/2013  . Rhabdomyolysis 09/2015  . 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 Left inguinal hernia 07/16/2016  . Rhabdomyolysis 10/14/2015  . Acute renal failure (ARF) (HCC) 10/14/2015  . Anemia 10/14/2015  . UTI (lower urinary tract infection) 10/14/2015  . Atrial fibrillation with rapid ventricular response (HCC) 10/14/2015  . Elevated CK   . Encounter for therapeutic drug monitoring 08/29/2014  . Obesity 03/28/2013  . HTN (hypertension) 03/28/2013  . Chronic kidney disease, stage III (moderate) (HCC) 03/28/2013  . Chronic  anticoagulation 03/28/2013  . Hyperhidrosis 03/28/2013  . Dilated aortic root (HCC) 03/28/2013  . Bifascicular block 03/28/2013  . Persistent atrial fibrillation (HCC) 02/08/2013    Past Surgical History:  Procedure Laterality Date  . CATARACT EXTRACTION    . HERNIA REPAIR    . INGUINAL HERNIA REPAIR  07/16/2016  . INGUINAL HERNIA REPAIR Left 07/16/2016   Procedure: LAPAROSCOPIC INGUINAL HERNIA REPAIR;  Surgeon: De Blanch Kinsinger, MD;  Location: [facility] OR;  Service: General;  Laterality: Left;  . INSERTION OF MESH Left 07/16/2016   Procedure: INSERTION OF MESH;  Surgeon: Rodman Pickle, MD;  [facility];  Service: General;  Laterality: Left;  . KIDNEY STONE SURGERY         Home Medications    Prior to Admission medications   Medication Sig Start Date End Date Taking? Authorizing Provider  allopurinol (ZYLOPRIM) 300 MG tablet Take 300 mg by mouth daily.  06/25/16   [provider]  buPROPion (WELLBUTRIN XL) 150 MG 24 hr tablet Take 150 mg by mouth daily.    [provider]  cephALEXin (KEFLEX) 500 MG capsule Take 1 capsule (500 mg total) by mouth 2 (two) times daily. 04/01/17   Linwood Dibbles, MD  CHROMIUM-CINNAMON PO Take 200 mg by mouth at bedtime.     [provider]  cloNIDine (CATAPRES) 0.1 MG tablet Take 0.1 mg  by mouth daily. At lunch.    [provider]  cloNIDine (CATAPRES) 0.2 MG tablet Take 0.2 mg by mouth 2 (two) times daily.    [provider]  escitalopram (LEXAPRO) 10 MG tablet Take 10 mg by mouth daily.    [provider]  ferrous sulfate 325 (65 FE) MG tablet Take 325 mg by mouth daily with breakfast.    [provider]  fluticasone (FLONASE) 50 MCG/ACT nasal spray Place 2 sprays into both nostrils daily.  09/28/14   [provider]  metoprolol (LOPRESSOR) 100 MG tablet Take 200 mg by mouth 2 (two) times daily.  01/05/13   [provider]  Omega-3 Fatty Acids (OMEGA 3 PO) Take 1 capsule  by mouth 2 (two) times daily.     [provider]  predniSONE (DELTASONE) 5 MG tablet Take 5 mg by mouth daily. FOR 6 DAYS 02/22/17   [provider]  vitamin E 400 UNIT capsule Take 400 Units by mouth daily.    [provider]  warfarin (COUMADIN) 5 MG tablet Take 2.5 mg by mouth daily.    [provider]  warfarin (COUMADIN) 5 MG tablet TAKE AS DIRECTED BY  COUMADIN  CLINIC 11/04/16   Jake BatheSkains, Mark C, MD    Family History Family History  Problem Relation Age of Onset  . Hypertension Mother   . Diabetes Mother     Social History Social History   Tobacco Use  . Smoking status: Former Smoker    Last attempt to quit: 03/1970    Years since quitting: 47.0  . Smokeless tobacco: Never Used  Substance Use Topics  . Alcohol use: No    Alcohol/week: 0.0 oz  . Drug use: No     Allergies   Duloxetine and Sulfur   Review of Systems Review of Systems  Constitutional: Positive for fever.  HENT: Positive for rhinorrhea.   Respiratory: Positive for cough. Negative for choking and chest tightness.   Cardiovascular: Negative for chest pain.  Gastrointestinal: Negative for abdominal distention.  Genitourinary: Negative for dysuria.  Neurological: Positive for dizziness.       Balance off for a couple of weeks  All other systems reviewed and are negative.    Physical Exam Updated Vital Signs BP (!) 144/101   Pulse (!) 119   Temp 97.8 F (36.6 C) (Oral)   Resp 19   Ht 1.727 m (5\' 8" )   Wt 86.2 kg (190 lb)   SpO2 99%   BMI 28.89 kg/m   Physical Exam  Constitutional: He appears well-developed and well-nourished. No distress.  HENT:  Head: Normocephalic and atraumatic.  Right Ear: External ear normal.  Left Ear: External ear normal.  Eyes: Conjunctivae are normal. Right eye exhibits no discharge. Left eye exhibits no discharge. No scleral icterus.  Neck: Neck supple. No tracheal deviation present.  Cardiovascular: Intact distal pulses. An  irregularly irregular rhythm present. Tachycardia present.  Pulmonary/Chest: Effort normal and breath sounds normal. No stridor. No respiratory distress. He has no wheezes. He has no rales.  Abdominal: Soft. Bowel sounds are normal. He exhibits no distension. There is no tenderness. There is no rebound and no guarding.  Musculoskeletal: He exhibits no edema or tenderness.  Neurological: He is alert. He has normal strength. No cranial nerve deficit (no facial droop, extraocular movements intact, no slurred speech) or sensory deficit. He exhibits normal muscle tone. He displays no seizure activity. Coordination normal.  Skin: Skin is warm and dry. No  rash noted.  Psychiatric: He has a normal mood and affect.  Nursing note and vitals reviewed.    ED Treatments / Results  Labs (all labs ordered are listed, but only abnormal results are displayed) Labs Reviewed  BASIC METABOLIC PANEL - Abnormal; Notable for the following components:      Result Value   Sodium 133 (*)    Chloride 99 (*)    CO2 21 (*)    Creatinine, Ser 1.68 (*)    GFR calc non Af Amer 40 (*)    GFR calc Af Amer 46 (*)    All other components within normal limits  URINALYSIS, ROUTINE W REFLEX MICROSCOPIC - Abnormal; Notable for the following components:   APPearance HAZY (*)    Hgb urine dipstick SMALL (*)    Ketones, ur 5 (*)    Protein, ur 100 (*)    Nitrite POSITIVE (*)    Leukocytes, UA MODERATE (*)    Squamous Epithelial / LPF 0-5 (*)    All other components within normal limits  PROTIME-INR - Abnormal; Notable for the following components:   Prothrombin Time 28.7 (*)    All other components within normal limits  CBC    EKG  EKG Interpretation  Date/Time:  Thursday April 01 2017 11:19:49 EST Ventricular Rate:  102 PR Interval:    QRS Duration: 158 QT Interval:  388 QTC Calculation: 505 R Axis:   -83 Text Interpretation:  Atrial fibrillation with rapid ventricular response Right bundle branch block  Left anterior fascicular block  Bifascicular block  Moderate voltage criteria for LVH, may be normal variant Abnormal ECG No significant change since last tracing Abnormal ekg Confirmed by Gerhard Munch [phone]) on 04/01/2017 11:25:10 AM       Radiology Dg Chest 2 View  Result Date: 04/01/2017 CLINICAL DATA:  Fever, weakness and vomiting. EXAM: CHEST  2 VIEW COMPARISON:  05/21/2016. FINDINGS: The heart size and mediastinal contours are within normal limits. The lungs appear clear. The visualized skeletal structures are unremarkable. IMPRESSION: No active cardiopulmonary disease. Electronically Signed   By: Signa Kell M.D.   On: 04/01/2017 16:38   Ct Head Wo Contrast  Result Date: 04/01/2017 CLINICAL DATA:  71 year old male status post fall in bathroom last night. EXAM: CT HEAD WITHOUT CONTRAST TECHNIQUE: Contiguous axial images were obtained from the base of the skull through the vertex without intravenous contrast. COMPARISON:  Head and cervical spine CT 10/14/2015 FINDINGS: Brain: Confluent hypodensity in the periventricular white matter and involvement of the bilateral anterior deep white matter capsules and basal ganglia appears stable. Stable gray-white matter differentiation elsewhere. No definite cortical encephalomalacia. No midline shift, ventriculomegaly, mass effect, evidence of mass lesion, intracranial hemorrhage or evidence of cortically based acute infarction. Vascular: Calcified atherosclerosis at the skull base. Dominant distal right vertebral artery. No suspicious intracranial vascular hyperdensity. Skull: No skull fracture identified. No acute osseous abnormality identified. Sinuses/Orbits: Visualized paranasal sinuses and mastoids are stable and well pneumatized. Other: Interval postoperative changes to both globes. Otherwise negative orbit and scalp soft tissues. Negative visualized noncontrast deep soft tissue spaces of the face. IMPRESSION: 1. No acute intracranial abnormality.  Chronic small vessel disease appears stable since 2017. 2.  No acute traumatic injury identified. Electronically Signed   By: Odessa Fleming M.D.   On: 04/01/2017 17:41    Procedures Procedures (including critical care time)  Medications Ordered in ED Medications  cefTRIAXone (ROCEPHIN) 1 g in dextrose 5 % 50 mL IVPB (not administered)  sodium  chloride 0.9 % bolus 500 mL (500 mLs Intravenous New Bag/Given 04/01/17 1659)     Initial Impression / Assessment and Plan / ED Course  I have reviewed the triage vital signs and the nursing notes.  Pertinent labs & imaging results that were available during my care of the patient were reviewed by me and considered in my medical decision making (see chart for details).   Patient presented to the emergency room with complaints of weakness and fever.  Patient does have a history of atrial fibrillation. He is mildly tachycardic at times but essentially is rate controlled.  Because of his falls and weakness and the fact that he is on anticoagulation I did a CT scan of his head.  CT scan does not show any acute abnormalities.  Urinalysis consistent with a urinary tract infection.  Laboratory tests are otherwise unremarkable.  Patient would like to go home.  I will give him a dose of Rocephin.  Plan on discharge home  Final Clinical Impressions(s) / ED Diagnoses   Final diagnoses:  Acute cystitis without hematuria    ED Discharge Orders        Ordered    cephALEXin (KEFLEX) 500 MG capsule  2 times daily     04/01/17 1809       Linwood DibblesKnapp, Ocia Simek, MD 04/01/17 [phone]

## 2017-04-01 NOTE — ED Notes (Signed)
Family at bedside. 

## 2017-04-07 ENCOUNTER — Telehealth: Payer: Self-pay | Admitting: Pharmacist

## 2017-04-07 NOTE — Telephone Encounter (Signed)
Pt left message on VM while office closed. Started cephalexin 500mg  BID for 7 days. LM to return call and advise that ok with coumadin.

## 2017-04-07 NOTE — Telephone Encounter (Signed)
Pt aware.

## 2017-04-12 ENCOUNTER — Ambulatory Visit (INDEPENDENT_AMBULATORY_CARE_PROVIDER_SITE_OTHER): Payer: Medicare Other

## 2017-04-12 DIAGNOSIS — I481 Persistent atrial fibrillation: Secondary | ICD-10-CM | POA: Diagnosis not present

## 2017-04-12 DIAGNOSIS — I4819 Other persistent atrial fibrillation: Secondary | ICD-10-CM

## 2017-04-12 DIAGNOSIS — Z5181 Encounter for therapeutic drug level monitoring: Secondary | ICD-10-CM | POA: Diagnosis not present

## 2017-04-12 LAB — POCT INR: INR: 3.5

## 2017-04-12 NOTE — Patient Instructions (Signed)
Skip today's dosage of Coumadin, then continue on same dosage 5mg  daily except 2.5mg  on Sundays and Thursdays.  Recheck INR in 3 weeks. Instructed to call if placed on any new medications [phone].

## 2017-04-30 DIAGNOSIS — E119 Type 2 diabetes mellitus without complications: Secondary | ICD-10-CM | POA: Diagnosis not present

## 2017-04-30 DIAGNOSIS — R269 Unspecified abnormalities of gait and mobility: Secondary | ICD-10-CM | POA: Diagnosis not present

## 2017-04-30 DIAGNOSIS — R634 Abnormal weight loss: Secondary | ICD-10-CM | POA: Diagnosis not present

## 2017-04-30 DIAGNOSIS — H6122 Impacted cerumen, left ear: Secondary | ICD-10-CM | POA: Diagnosis not present

## 2017-05-03 ENCOUNTER — Ambulatory Visit (INDEPENDENT_AMBULATORY_CARE_PROVIDER_SITE_OTHER): Payer: Medicare Other | Admitting: *Deleted

## 2017-05-03 DIAGNOSIS — Z5181 Encounter for therapeutic drug level monitoring: Secondary | ICD-10-CM

## 2017-05-03 DIAGNOSIS — I481 Persistent atrial fibrillation: Secondary | ICD-10-CM | POA: Diagnosis not present

## 2017-05-03 DIAGNOSIS — I4819 Other persistent atrial fibrillation: Secondary | ICD-10-CM

## 2017-05-03 DIAGNOSIS — I4891 Unspecified atrial fibrillation: Secondary | ICD-10-CM | POA: Diagnosis not present

## 2017-05-03 LAB — POCT INR: INR: 3

## 2017-05-03 NOTE — Patient Instructions (Signed)
Description   Continue on same dosage 5mg  daily except 2.5mg  on Sundays and Thursdays.  Recheck INR in 2 weeks as pt is now drinking 3 Ensures a day . Instructed to call if placed on any new medications [phone].

## 2017-05-17 ENCOUNTER — Ambulatory Visit (INDEPENDENT_AMBULATORY_CARE_PROVIDER_SITE_OTHER): Payer: Medicare Other | Admitting: *Deleted

## 2017-05-17 DIAGNOSIS — I4891 Unspecified atrial fibrillation: Secondary | ICD-10-CM | POA: Diagnosis not present

## 2017-05-17 DIAGNOSIS — I481 Persistent atrial fibrillation: Secondary | ICD-10-CM | POA: Diagnosis not present

## 2017-05-17 DIAGNOSIS — I4819 Other persistent atrial fibrillation: Secondary | ICD-10-CM

## 2017-05-17 DIAGNOSIS — Z5181 Encounter for therapeutic drug level monitoring: Secondary | ICD-10-CM | POA: Diagnosis not present

## 2017-05-17 LAB — POCT INR: INR: 2.1

## 2017-05-17 NOTE — Patient Instructions (Signed)
Description   Continue on same dosage 5mg  daily except 2.5mg  on Sundays and Thursdays.  Recheck INR in 3 weeks as pt is now drinking 1 to 2 Boost  a day . Instructed to call if placed on any new medications [phone].

## 2017-05-26 DIAGNOSIS — F329 Major depressive disorder, single episode, unspecified: Secondary | ICD-10-CM | POA: Diagnosis not present

## 2017-05-26 DIAGNOSIS — I482 Chronic atrial fibrillation: Secondary | ICD-10-CM | POA: Diagnosis not present

## 2017-05-26 DIAGNOSIS — E785 Hyperlipidemia, unspecified: Secondary | ICD-10-CM | POA: Diagnosis not present

## 2017-05-26 DIAGNOSIS — G894 Chronic pain syndrome: Secondary | ICD-10-CM | POA: Diagnosis not present

## 2017-05-26 DIAGNOSIS — I129 Hypertensive chronic kidney disease with stage 1 through stage 4 chronic kidney disease, or unspecified chronic kidney disease: Secondary | ICD-10-CM | POA: Diagnosis not present

## 2017-05-26 DIAGNOSIS — M19049 Primary osteoarthritis, unspecified hand: Secondary | ICD-10-CM | POA: Diagnosis not present

## 2017-05-26 DIAGNOSIS — Z0000 Encounter for general adult medical examination without abnormal findings: Secondary | ICD-10-CM | POA: Diagnosis not present

## 2017-05-26 DIAGNOSIS — Z1389 Encounter for screening for other disorder: Secondary | ICD-10-CM | POA: Diagnosis not present

## 2017-05-26 DIAGNOSIS — F411 Generalized anxiety disorder: Secondary | ICD-10-CM | POA: Diagnosis not present

## 2017-05-31 DIAGNOSIS — H04123 Dry eye syndrome of bilateral lacrimal glands: Secondary | ICD-10-CM | POA: Diagnosis not present

## 2017-05-31 DIAGNOSIS — E119 Type 2 diabetes mellitus without complications: Secondary | ICD-10-CM | POA: Diagnosis not present

## 2017-05-31 DIAGNOSIS — Z961 Presence of intraocular lens: Secondary | ICD-10-CM | POA: Diagnosis not present

## 2017-06-07 ENCOUNTER — Ambulatory Visit (INDEPENDENT_AMBULATORY_CARE_PROVIDER_SITE_OTHER): Payer: Medicare Other | Admitting: *Deleted

## 2017-06-07 DIAGNOSIS — I481 Persistent atrial fibrillation: Secondary | ICD-10-CM | POA: Diagnosis not present

## 2017-06-07 DIAGNOSIS — Z5181 Encounter for therapeutic drug level monitoring: Secondary | ICD-10-CM

## 2017-06-07 DIAGNOSIS — I4819 Other persistent atrial fibrillation: Secondary | ICD-10-CM

## 2017-06-07 LAB — POCT INR: INR: 2.6

## 2017-06-07 NOTE — Patient Instructions (Signed)
Description   Continue on same dosage 5mg  daily except 2.5mg  on Sundays and Thursdays.  Recheck INR in 4 weeks as pt is now drinking 1 to 2 Boost  a day . Instructed to call if placed on any new medications [phone].

## 2017-06-20 DIAGNOSIS — M109 Gout, unspecified: Secondary | ICD-10-CM | POA: Diagnosis not present

## 2017-07-05 ENCOUNTER — Ambulatory Visit (INDEPENDENT_AMBULATORY_CARE_PROVIDER_SITE_OTHER): Payer: Medicare Other | Admitting: *Deleted

## 2017-07-05 DIAGNOSIS — Z5181 Encounter for therapeutic drug level monitoring: Secondary | ICD-10-CM | POA: Diagnosis not present

## 2017-07-05 DIAGNOSIS — I481 Persistent atrial fibrillation: Secondary | ICD-10-CM

## 2017-07-05 DIAGNOSIS — I4819 Other persistent atrial fibrillation: Secondary | ICD-10-CM

## 2017-07-05 LAB — POCT INR: INR: 2.2

## 2017-07-05 NOTE — Patient Instructions (Signed)
Description   Continue on same dosage 5mg  daily except 2.5mg  on Sundays and Thursdays.  Recheck INR in 5 weeks as pt is now drinking 1 to 2 Boost a day . Instructed to call if placed on any new medications [phone].

## 2017-07-07 ENCOUNTER — Other Ambulatory Visit: Payer: Self-pay | Admitting: Cardiology

## 2017-08-09 ENCOUNTER — Ambulatory Visit (INDEPENDENT_AMBULATORY_CARE_PROVIDER_SITE_OTHER): Payer: Medicare Other | Admitting: *Deleted

## 2017-08-09 DIAGNOSIS — J302 Other seasonal allergic rhinitis: Secondary | ICD-10-CM | POA: Diagnosis not present

## 2017-08-09 DIAGNOSIS — I481 Persistent atrial fibrillation: Secondary | ICD-10-CM | POA: Diagnosis not present

## 2017-08-09 DIAGNOSIS — J309 Allergic rhinitis, unspecified: Secondary | ICD-10-CM | POA: Diagnosis not present

## 2017-08-09 DIAGNOSIS — Z5181 Encounter for therapeutic drug level monitoring: Secondary | ICD-10-CM | POA: Diagnosis not present

## 2017-08-09 DIAGNOSIS — I4819 Other persistent atrial fibrillation: Secondary | ICD-10-CM

## 2017-08-09 DIAGNOSIS — H6123 Impacted cerumen, bilateral: Secondary | ICD-10-CM | POA: Diagnosis not present

## 2017-08-09 LAB — POCT INR: INR: 1.7

## 2017-08-09 NOTE — Patient Instructions (Signed)
Description   Today take 1.5 tablets ( 7.5mg  ) then Continue on same dosage 5mg  daily except 2.5mg  on Sundays and Thursdays.  Recheck INR in 2 weeks as pt is now drinking 1 to 2 Boost a day . Instructed to call if placed on any new medications [phone].

## 2017-08-23 ENCOUNTER — Ambulatory Visit (INDEPENDENT_AMBULATORY_CARE_PROVIDER_SITE_OTHER): Payer: Medicare Other | Admitting: Pharmacist

## 2017-08-23 DIAGNOSIS — I4819 Other persistent atrial fibrillation: Secondary | ICD-10-CM

## 2017-08-23 DIAGNOSIS — I481 Persistent atrial fibrillation: Secondary | ICD-10-CM

## 2017-08-23 DIAGNOSIS — Z5181 Encounter for therapeutic drug level monitoring: Secondary | ICD-10-CM | POA: Diagnosis not present

## 2017-08-23 LAB — POCT INR: INR: 2.1

## 2017-08-23 NOTE — Patient Instructions (Signed)
Description   Continue on same dosage  daily except 2.5mg  on Sundays and Thursdays.  Recheck INR in 3 weeks . Instructed to call if placed on any new medications [phone].

## 2017-09-13 ENCOUNTER — Ambulatory Visit (INDEPENDENT_AMBULATORY_CARE_PROVIDER_SITE_OTHER): Payer: Medicare Other | Admitting: Cardiology

## 2017-09-13 ENCOUNTER — Ambulatory Visit (INDEPENDENT_AMBULATORY_CARE_PROVIDER_SITE_OTHER): Payer: Medicare Other | Admitting: *Deleted

## 2017-09-13 ENCOUNTER — Encounter: Payer: Self-pay | Admitting: Cardiology

## 2017-09-13 VITALS — BP 130/94 | HR 90 | Ht 72.0 in | Wt 188.8 lb

## 2017-09-13 DIAGNOSIS — I10 Essential (primary) hypertension: Secondary | ICD-10-CM | POA: Diagnosis not present

## 2017-09-13 DIAGNOSIS — I4819 Other persistent atrial fibrillation: Secondary | ICD-10-CM

## 2017-09-13 DIAGNOSIS — Z5181 Encounter for therapeutic drug level monitoring: Secondary | ICD-10-CM | POA: Diagnosis not present

## 2017-09-13 DIAGNOSIS — I481 Persistent atrial fibrillation: Secondary | ICD-10-CM

## 2017-09-13 DIAGNOSIS — I77810 Thoracic aortic ectasia: Secondary | ICD-10-CM

## 2017-09-13 LAB — POCT INR: INR: 2

## 2017-09-13 NOTE — Patient Instructions (Signed)
Medication Instructions:  The current medical regimen is effective;  continue present plan and medications.  Testing/Procedures: Your physician has requested that you have an echocardiogram. Echocardiography is a painless test that uses sound waves to create images of your heart. It provides your doctor with information about the size and shape of your heart and how well your heart's chambers and valves are working. This procedure takes approximately one hour. There are no restrictions for this procedure.  Follow-Up: Follow up in 6 months with Dr. Skains.  You will receive a letter in the mail 2 months before you are due.  Please call us when you receive this letter to schedule your follow up appointment.  If you need a refill on your cardiac medications before your next appointment, please call your pharmacy.  Thank you for choosing [facility]!!       

## 2017-09-13 NOTE — Patient Instructions (Signed)
Description   Continue on same dosage  daily except 2.5mg  on Sundays and Thursdays.  Recheck INR in 3 weeks with Echo. Instructed to call if placed on any new medications [phone].

## 2017-09-13 NOTE — Progress Notes (Signed)
[address] [place], [place]  [address] Phone: [phone] Fax:  [phone]  Date:  09/13/2017   ID:  Ralph Sullivan, DOB [birth year], MRN [id number]  PCP:  Ralph Ishihara, MD   History of Present Illness: Ralph Sullivan is a 72 y.o. male (husband of Ralph Sullivan) who presented to me in June of 2014 with return of atrial fibrillation. Heart rate 79 on EKG with right bundle branch block, left anterior fascicular block, bifascicular block.  In 2009, he converted with amiodarone. Amiodarone was used because of moderate LVH seen on prior echocardiogram, normal EF. In 2011 went back in afib (off of amiodarone). He believes he has been in atrial fibrillation over the past 3 years. We have been maintaining rate control. He has not had any strokelike symptoms. He does battled with gout occasionally. No alcohol intake. No recent fevers. HR was slowed in NSR.  For the last few years however he has been in atrial fibrillation with good rate control.   An echocardiogram 6/14 demonstrated aortic root size 4.5 cm with mild LVH, normal EF. On repeat 6/15 and in 2017 - 4.2cm  Spirometry was reassuring. Has lost weight.   SOB weakness in legs, ABIs were normal. Obviously still grieving the loss of his wife Ralph Sullivan. He has been battling gout as well. Periodic use of prednisone. Mild left ankle swelling.  5/60/18-his ankle swelling has improved since stopping the amlodipine. Still feeling disequilibrium. Physical therapy. No chest pain, no significant shortness of breath. No syncope.  03/25/17 - BP high at night 170/115.Legs hurt all the time. ABI were normal.  He fell, hurt his right wrist.  No syncope.  Mechanical fall from disequilibrium.  No chest pain syncope.  09/13/2017-overall fairly stable.  Drinking boost to help with weight loss.  No chest pain.  He did have some left pectoralis discomfort which is likely musculoskeletal from utilizing a walker, prior fall.  No fevers chills  nausea vomiting syncope.   Wt Readings from Last 3 Encounters:  09/13/17 188 lb 12.8 oz (85.6 kg)  04/01/17 190 lb (86.2 kg)  03/25/17 190 lb 1.9 oz (86.2 kg)     Past Medical History:  Diagnosis Date  . Atrial fibrillation (HCC) 02/08/2013   CHADS-VAS - 2 (AGE, HTN). On anticoagulation. Holter 6/14 - 2.3 sec pause. Avg HR 87. Rate control   . Bifascicular block 03/28/2013  . Bilateral congenital hammer toes   . Chronic anticoagulation 03/28/2013  . Chronic kidney disease, stage III (moderate) (HCC) 03/28/2013  . 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 Gout   . Hyperhidrosis 03/28/2013  . Obesity, unspecified 03/28/2013  . Rhabdomyolysis 09/2015  . 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 CATARACT EXTRACTION    . HERNIA REPAIR    . INGUINAL HERNIA REPAIR  07/16/2016  . INGUINAL HERNIA REPAIR Left 07/16/2016   Procedure: LAPAROSCOPIC INGUINAL HERNIA REPAIR;  Surgeon: De Blanch Kinsinger, MD;  Location[facility] OR;  Service: General;  Laterality: Left;  . INSERTION OF MESH Left 07/16/2016   Procedure: INSERTION OF MESH;  Surgeon: Rodman Pickle, MD;  Location: [facility] OR;  Service: General;  Laterality: Left;  . KIDNEY STONE SURGERY      Current Outpatient Medications  Medication Sig Dispense Refill  . allopurinol (ZYLOPRIM) 300 MG tablet Take 300 mg  by mouth daily.     Marland Kitchen buPROPion (WELLBUTRIN XL) 150 MG 24 hr tablet Take 150 mg by mouth daily.    . CHROMIUM-CINNAMON PO Take 200 mg by mouth at bedtime.     . cloNIDine (CATAPRES) 0.1 MG tablet Take 0.1 mg by mouth daily. At lunch.    . cloNIDine (CATAPRES) 0.2 MG tablet Take 0.2 mg by mouth 2 (two) times daily.    Marland Kitchen escitalopram (LEXAPRO) 10 MG tablet Take 10 mg by mouth daily.    . ferrous sulfate 325 (65 FE) MG tablet Take 325 mg by mouth daily with breakfast.    . fluticasone (FLONASE) 50 MCG/ACT nasal  spray Place 2 sprays into both nostrils daily.     . meclizine (ANTIVERT) 12.5 MG tablet Take 12.5 mg by mouth 2 (two) times daily as needed for dizziness.    . metoprolol (LOPRESSOR) 100 MG tablet Take 200 mg by mouth 2 (two) times daily.     . Omega-3 Fatty Acids (OMEGA 3 PO) Take 1 capsule by mouth 2 (two) times daily.     . vitamin E 400 UNIT capsule Take 400 Units by mouth daily.    Marland Kitchen warfarin (COUMADIN) 5 MG tablet TAKE AS DIRECTED BY  COUMADIN  CLINIC 40 tablet 3   No current facility-administered medications for this visit.     Allergies:    Allergies  Allergen Reactions  . Duloxetine Other (See Comments)    More nervous  . Oxycontin [Oxycodone Hcl] Nausea And Vomiting  . Sulfur Nausea And Vomiting    Social History:  The patient  reports that he quit smoking about 47 years ago. He has never used smokeless tobacco. He reports that he does not drink alcohol or use drugs.   ROS:  Please see the history of present illness.  All other review of systems negative   PHYSICAL EXAM: VS:  BP (!) 130/94   Pulse 90   Ht 6' (1.829 m)   Wt 188 lb 12.8 oz (85.6 kg)   BMI 25.61 kg/m  GEN: Well nourished, well developed, in no acute distress using walker  HEENT: normal  Neck: no JVD, carotid bruits, or masses Cardiac: Irregularly irregular; no murmurs, rubs, or gallops,no edema  Respiratory:  clear to auscultation bilaterally, normal work of breathing GI: soft, nontender, nondistended, + BS MS: no deformity mild atrophy noted, raise his right arm up with his left hand to shake my hand. Skin: warm and dry, no rash Neuro:  Alert and Oriented x 3, Strength and sensation are intact Psych: euthymic mood, full affect  EKG:   05/02/15-atrial fibrillation heart rate 88 bpm, right bundle branch block, left anterior fascicular block, bifascicular block. 04/10/14-right bundle branch block, atrial fibrillation, 79, left anterior fascicular block, bifascicular block-no change from prior Atrial  fibrillation heart rate 69, right bundle branch block, left anterior specific block, bifascicular block     ABIs 05/10/15-normal  ECHO 05/10/15: - Left ventricle: The cavity size was normal. There was mild  hypertrophy of the posterior wall and moderate hypertrophy of the  septal wall. Systolic function was at the lower limits of normal.  The estimated ejection fraction was in the range of 50% to 55%. - Left atrium: The atrium was severely dilated. - Pulmonary arteries: The main pulmonary artery was normal-sized.  Systolic pressure was within the normal range, estimated to be 28  mm Hg. - Aortic root-42 mm-stable - Inferior vena cava: The vessel was normal in size. The  respirophasic  diameter changes were in the normal range (= 50%),  consistent with normal central venous pressure.  Labs: LDL 57, HDL 63, hemoglobin A1c 6.4, hemoglobin 12.7, potassium 3.9 on 04/07/16  ASSESSMENT AND PLAN:   Permanent atrial fibrillation  - Overall doing well with rate control with high-dose metoprolol 200 mg twice a day.  - Prior Holter monitor showed a 2.3 second pause. Stable.  No syncope.  His fall previously was secondary to losing his balance.  No changes made.  Fatigue  - Likely multifactorial. He is getting iron infusion from Dr. Hyman Hopes.  - Continue to encourage movement, exercise.  - He has lost weight. His TSH has been normal in the past. Remember, he lost his wife 2 years ago, has been going through quite a bit.  No changes Utilizing walker.  Drinking boost.  Essential hypertension  -Overall appreciate Megan Supple's assistance.  Continue with current regimen.  No significant changes.  Diastolic slightly elevated at today's visit.  Dilated aortic root  - 42 mm. Stable.  No change.  We will check echocardiogram since it has been 2 years.  Chronic kidney disease stage III  - Dr. Hyman Hopes has been monitoring.  No changes made.  Creatinine 1.58.  Chronic anticoagulation  - Coumadin. We  are watching in clinic.  Doing very well.  No changes.   78-month follow-up with me.   Signed, Donato Schultz, MD [facility]  09/13/2017 10:38 AM

## 2017-09-14 DIAGNOSIS — M1712 Unilateral primary osteoarthritis, left knee: Secondary | ICD-10-CM | POA: Diagnosis not present

## 2017-09-22 DIAGNOSIS — I4891 Unspecified atrial fibrillation: Secondary | ICD-10-CM | POA: Diagnosis not present

## 2017-09-22 DIAGNOSIS — N183 Chronic kidney disease, stage 3 (moderate): Secondary | ICD-10-CM | POA: Diagnosis not present

## 2017-09-22 DIAGNOSIS — R972 Elevated prostate specific antigen [PSA]: Secondary | ICD-10-CM | POA: Diagnosis not present

## 2017-09-22 DIAGNOSIS — N2581 Secondary hyperparathyroidism of renal origin: Secondary | ICD-10-CM | POA: Diagnosis not present

## 2017-09-22 DIAGNOSIS — D631 Anemia in chronic kidney disease: Secondary | ICD-10-CM | POA: Diagnosis not present

## 2017-09-22 DIAGNOSIS — M1712 Unilateral primary osteoarthritis, left knee: Secondary | ICD-10-CM | POA: Diagnosis not present

## 2017-09-22 DIAGNOSIS — I77819 Aortic ectasia, unspecified site: Secondary | ICD-10-CM | POA: Diagnosis not present

## 2017-09-22 DIAGNOSIS — M1711 Unilateral primary osteoarthritis, right knee: Secondary | ICD-10-CM | POA: Diagnosis not present

## 2017-09-22 DIAGNOSIS — N400 Enlarged prostate without lower urinary tract symptoms: Secondary | ICD-10-CM | POA: Diagnosis not present

## 2017-09-22 DIAGNOSIS — I129 Hypertensive chronic kidney disease with stage 1 through stage 4 chronic kidney disease, or unspecified chronic kidney disease: Secondary | ICD-10-CM | POA: Diagnosis not present

## 2017-09-22 DIAGNOSIS — N390 Urinary tract infection, site not specified: Secondary | ICD-10-CM | POA: Diagnosis not present

## 2017-09-28 DIAGNOSIS — M1712 Unilateral primary osteoarthritis, left knee: Secondary | ICD-10-CM | POA: Diagnosis not present

## 2017-10-05 ENCOUNTER — Ambulatory Visit (HOSPITAL_COMMUNITY): Payer: Medicare Other | Attending: Cardiovascular Disease

## 2017-10-05 ENCOUNTER — Ambulatory Visit (INDEPENDENT_AMBULATORY_CARE_PROVIDER_SITE_OTHER): Payer: Medicare Other

## 2017-10-05 ENCOUNTER — Other Ambulatory Visit: Payer: Self-pay

## 2017-10-05 DIAGNOSIS — I10 Essential (primary) hypertension: Secondary | ICD-10-CM | POA: Diagnosis not present

## 2017-10-05 DIAGNOSIS — Z5181 Encounter for therapeutic drug level monitoring: Secondary | ICD-10-CM

## 2017-10-05 DIAGNOSIS — I481 Persistent atrial fibrillation: Secondary | ICD-10-CM

## 2017-10-05 DIAGNOSIS — I77810 Thoracic aortic ectasia: Secondary | ICD-10-CM | POA: Insufficient documentation

## 2017-10-05 DIAGNOSIS — I1310 Hypertensive heart and chronic kidney disease without heart failure, with stage 1 through stage 4 chronic kidney disease, or unspecified chronic kidney disease: Secondary | ICD-10-CM | POA: Insufficient documentation

## 2017-10-05 DIAGNOSIS — I071 Rheumatic tricuspid insufficiency: Secondary | ICD-10-CM | POA: Diagnosis not present

## 2017-10-05 DIAGNOSIS — I4819 Other persistent atrial fibrillation: Secondary | ICD-10-CM

## 2017-10-05 DIAGNOSIS — N189 Chronic kidney disease, unspecified: Secondary | ICD-10-CM | POA: Diagnosis not present

## 2017-10-05 LAB — POCT INR: INR: 1.5 — AB (ref 2.0–3.0)

## 2017-10-05 NOTE — Patient Instructions (Signed)
Description   Take 1.5 tablets today, then start taking 5mg  daily except 2.5mg  on Sundays.  Recheck INR in 2 weeks with Echo. Instructed to call if placed on any new medications [phone].

## 2017-10-06 ENCOUNTER — Other Ambulatory Visit: Payer: Self-pay | Admitting: *Deleted

## 2017-10-06 DIAGNOSIS — I4819 Other persistent atrial fibrillation: Secondary | ICD-10-CM

## 2017-10-06 DIAGNOSIS — I10 Essential (primary) hypertension: Secondary | ICD-10-CM

## 2017-10-06 DIAGNOSIS — Z5181 Encounter for therapeutic drug level monitoring: Secondary | ICD-10-CM

## 2017-10-07 ENCOUNTER — Other Ambulatory Visit: Payer: Medicare Other | Admitting: *Deleted

## 2017-10-07 ENCOUNTER — Other Ambulatory Visit: Payer: Self-pay | Admitting: *Deleted

## 2017-10-07 DIAGNOSIS — I4819 Other persistent atrial fibrillation: Secondary | ICD-10-CM

## 2017-10-07 DIAGNOSIS — Z5181 Encounter for therapeutic drug level monitoring: Secondary | ICD-10-CM

## 2017-10-07 DIAGNOSIS — I10 Essential (primary) hypertension: Secondary | ICD-10-CM

## 2017-10-07 DIAGNOSIS — I481 Persistent atrial fibrillation: Secondary | ICD-10-CM | POA: Diagnosis not present

## 2017-10-07 LAB — BASIC METABOLIC PANEL
BUN / CREAT RATIO: 16 (ref 10–24)
BUN: 23 mg/dL (ref 8–27)
CO2: 23 mmol/L (ref 20–29)
CREATININE: 1.44 mg/dL — AB (ref 0.76–1.27)
Calcium: 9.4 mg/dL (ref 8.6–10.2)
Chloride: 97 mmol/L (ref 96–106)
GFR calc non Af Amer: 49 mL/min/{1.73_m2} — ABNORMAL LOW (ref >59)
GFR, EST AFRICAN AMERICAN: 56 mL/min/{1.73_m2} — AB (ref >59)
Glucose: 102 mg/dL — ABNORMAL HIGH (ref 65–99)
Potassium: 4.5 mmol/L (ref 3.5–5.2)
Sodium: 132 mmol/L — ABNORMAL LOW (ref 134–144)

## 2017-10-07 MED ORDER — SACUBITRIL-VALSARTAN 24-26 MG PO TABS: 1.0000 | ORAL_TABLET | Freq: Two times a day (BID) | ORAL | 6 refills | Status: DC

## 2017-10-19 ENCOUNTER — Other Ambulatory Visit: Payer: Medicare Other

## 2017-10-19 ENCOUNTER — Ambulatory Visit (INDEPENDENT_AMBULATORY_CARE_PROVIDER_SITE_OTHER): Payer: Medicare Other | Admitting: *Deleted

## 2017-10-19 DIAGNOSIS — I10 Essential (primary) hypertension: Secondary | ICD-10-CM | POA: Diagnosis not present

## 2017-10-19 DIAGNOSIS — I481 Persistent atrial fibrillation: Secondary | ICD-10-CM | POA: Diagnosis not present

## 2017-10-19 DIAGNOSIS — Z5181 Encounter for therapeutic drug level monitoring: Secondary | ICD-10-CM | POA: Diagnosis not present

## 2017-10-19 DIAGNOSIS — I4819 Other persistent atrial fibrillation: Secondary | ICD-10-CM

## 2017-10-19 LAB — BASIC METABOLIC PANEL
BUN / CREAT RATIO: 14 (ref 10–24)
BUN: 19 mg/dL (ref 8–27)
CO2: 22 mmol/L (ref 20–29)
CREATININE: 1.39 mg/dL — AB (ref 0.76–1.27)
Calcium: 9.5 mg/dL (ref 8.6–10.2)
Chloride: 93 mmol/L — ABNORMAL LOW (ref 96–106)
GFR calc Af Amer: 59 mL/min/{1.73_m2} — ABNORMAL LOW (ref >59)
GFR, EST NON AFRICAN AMERICAN: 51 mL/min/{1.73_m2} — AB (ref >59)
GLUCOSE: 98 mg/dL (ref 65–99)
POTASSIUM: 4.3 mmol/L (ref 3.5–5.2)
SODIUM: 130 mmol/L — AB (ref 134–144)

## 2017-10-19 LAB — POCT INR: INR: 2 (ref 2.0–3.0)

## 2017-10-19 NOTE — Patient Instructions (Signed)
Description   Continue taking 5mg  daily except 2.5mg  on Sundays.  Recheck INR in 3 weeks. Continue your leafy green vegetable intake consistent and or your Boost. Call us with any medication changes or questions to Coumadin Clinic [phone].

## 2017-10-21 ENCOUNTER — Telehealth: Payer: Self-pay | Admitting: *Deleted

## 2017-10-21 NOTE — Telephone Encounter (Signed)
[facility].

## 2017-10-25 NOTE — Telephone Encounter (Signed)
**Note De-Identified Julus Kelley Obfuscation** I have completed an [facility] that [facility] to us [facility]. I have placed it in Dr Anne FuSkains mail bin awaiting his signature.

## 2017-10-25 NOTE — Telephone Encounter (Signed)
**Note De-Identified Ralph Sullivan Obfuscation** I have [facility]ed the [facility] to [facility].

## 2017-10-26 DIAGNOSIS — D631 Anemia in chronic kidney disease: Secondary | ICD-10-CM | POA: Diagnosis not present

## 2017-10-26 DIAGNOSIS — I77819 Aortic ectasia, unspecified site: Secondary | ICD-10-CM | POA: Diagnosis not present

## 2017-10-26 DIAGNOSIS — N2581 Secondary hyperparathyroidism of renal origin: Secondary | ICD-10-CM | POA: Diagnosis not present

## 2017-10-26 DIAGNOSIS — I4891 Unspecified atrial fibrillation: Secondary | ICD-10-CM | POA: Diagnosis not present

## 2017-10-26 DIAGNOSIS — N400 Enlarged prostate without lower urinary tract symptoms: Secondary | ICD-10-CM | POA: Diagnosis not present

## 2017-10-26 DIAGNOSIS — R972 Elevated prostate specific antigen [PSA]: Secondary | ICD-10-CM | POA: Diagnosis not present

## 2017-10-26 DIAGNOSIS — I129 Hypertensive chronic kidney disease with stage 1 through stage 4 chronic kidney disease, or unspecified chronic kidney disease: Secondary | ICD-10-CM | POA: Diagnosis not present

## 2017-10-26 DIAGNOSIS — N183 Chronic kidney disease, stage 3 (moderate): Secondary | ICD-10-CM | POA: Diagnosis not present

## 2017-10-29 ENCOUNTER — Telehealth: Payer: Self-pay | Admitting: Cardiology

## 2017-10-29 NOTE — Telephone Encounter (Signed)
Returned call to Pt.  Pt has coumadin clinic appt on 11/09/2017 and appt with Dr. Anne FuSkains.  Pharmacy will meet with Pt during coumadin clinic to discuss [facility].  At this time Pt has enough medication to last until Monday 11/01/2017.  Notified Pt I would leave enough samples at front desk until he could meet with Dr. Anne FuSkains and pharmacist.  Pt indicates understanding, will pick up samples on Monday.  Pt thanked nurse for assistance.

## 2017-10-29 NOTE — Telephone Encounter (Signed)
New message:      Pt c/o medication issue:  1. Name of Medication: sacubitril-valsartan (ENTRESTO) 24-26 MG  2. How are you currently taking this medication (dosage and times per day)? Take 1 tablet by mouth 2 (two) times daily.  3. Are you having a reaction (difficulty breathing--STAT)? No  4. What is your medication issue? Pt states he is unable to pay for this medication, Pt states it is $609 and his insurance won't pay anything.

## 2017-11-01 ENCOUNTER — Other Ambulatory Visit: Payer: Self-pay | Admitting: Cardiology

## 2017-11-01 NOTE — Telephone Encounter (Signed)
I called [employer] and s/w Red. Per Red this PA has been approved at the appeal level. Approval good until 11/09/2019.  Red states that if the pharmacy has trouble getting this RX to pass to use authorization# [id number]

## 2017-11-06 DIAGNOSIS — M19042 Primary osteoarthritis, left hand: Secondary | ICD-10-CM | POA: Diagnosis not present

## 2017-11-06 DIAGNOSIS — M109 Gout, unspecified: Secondary | ICD-10-CM | POA: Diagnosis not present

## 2017-11-09 ENCOUNTER — Ambulatory Visit (INDEPENDENT_AMBULATORY_CARE_PROVIDER_SITE_OTHER): Payer: Medicare Other | Admitting: Cardiology

## 2017-11-09 ENCOUNTER — Ambulatory Visit (INDEPENDENT_AMBULATORY_CARE_PROVIDER_SITE_OTHER): Payer: Medicare Other

## 2017-11-09 ENCOUNTER — Encounter: Payer: Self-pay | Admitting: Cardiology

## 2017-11-09 VITALS — BP 130/89 | HR 71 | Ht 72.0 in | Wt 191.0 lb

## 2017-11-09 DIAGNOSIS — M1712 Unilateral primary osteoarthritis, left knee: Secondary | ICD-10-CM | POA: Diagnosis not present

## 2017-11-09 DIAGNOSIS — I4821 Permanent atrial fibrillation: Secondary | ICD-10-CM

## 2017-11-09 DIAGNOSIS — I5022 Chronic systolic (congestive) heart failure: Secondary | ICD-10-CM

## 2017-11-09 DIAGNOSIS — Z5181 Encounter for therapeutic drug level monitoring: Secondary | ICD-10-CM

## 2017-11-09 DIAGNOSIS — M1711 Unilateral primary osteoarthritis, right knee: Secondary | ICD-10-CM | POA: Diagnosis not present

## 2017-11-09 DIAGNOSIS — I481 Persistent atrial fibrillation: Secondary | ICD-10-CM | POA: Diagnosis not present

## 2017-11-09 DIAGNOSIS — I482 Chronic atrial fibrillation: Secondary | ICD-10-CM | POA: Diagnosis not present

## 2017-11-09 DIAGNOSIS — I77810 Thoracic aortic ectasia: Secondary | ICD-10-CM

## 2017-11-09 DIAGNOSIS — I4819 Other persistent atrial fibrillation: Secondary | ICD-10-CM

## 2017-11-09 LAB — POCT INR: INR: 1.9 — AB (ref 2.0–3.0)

## 2017-11-09 NOTE — Progress Notes (Signed)
[address], [address] [place], [place]  [address] Phone: [phone] Fax:  [phone]  Date:  11/09/2017   ID:  Ralph Sullivan, DOB November 01, 1945, MRN [id number]  PCP:  Lorenda Ishihara, MD   History of Present Illness: Ralph Sullivan is a 72 y.o. male (husband of Ariq Khamis) who presented to me in June of 2014 with return of atrial fibrillation. Heart rate 79 on EKG with right bundle branch block, left anterior fascicular block, bifascicular block.  In 2009, he converted with amiodarone. Amiodarone was used because of moderate LVH seen on prior echocardiogram, normal EF. In 2011 went back in afib (off of amiodarone). He believes he has been in atrial fibrillation over the past 3 years. We have been maintaining rate control. He has not had any strokelike symptoms. He does battled with gout occasionally. No alcohol intake. No recent fevers. HR was slowed in NSR.  For the last few years however he has been in atrial fibrillation with good rate control.   An echocardiogram 6/14 demonstrated aortic root size 4.5 cm with mild LVH, normal EF. On repeat 6/15 and in 2017 - 4.2cm  Spirometry was reassuring. Has lost weight.   SOB weakness in legs, ABIs were normal. Obviously still grieving the loss of his wife [name]. He has been battling gout as well. Periodic use of prednisone. Mild left ankle swelling.  5/60/18-his ankle swelling has improved since stopping the amlodipine. Still feeling disequilibrium. Physical therapy. No chest pain, no significant shortness of breath. No syncope.  03/25/17 - BP high at night 170/115.Legs hurt all the time. ABI were normal.  He fell, hurt his right wrist.  No syncope.  Mechanical fall from disequilibrium.  No chest pain syncope.  09/13/2017-overall fairly stable.  Drinking boost to help with weight loss.  No chest pain.  He did have some left pectoralis discomfort which is likely musculoskeletal from utilizing a walker, prior fall.  No fevers chills  nausea vomiting syncope.  11/09/2017-seems to be doing quite well since Entresto start EF 25%.  Feeling better.  Creatinine 1.54.  Stable.  No significant shortness of breath, no chest pain. Thankful for patient's assistance.    Wt Readings from Last 3 Encounters:  11/09/17 191 lb (86.6 kg)  09/13/17 188 lb 12.8 oz (85.6 kg)  04/01/17 190 lb (86.2 kg)     Past Medical History:  Diagnosis Date  . Atrial fibrillation (HCC) 02/08/2013   CHADS-VAS - 2 (AGE, HTN). On anticoagulation. Holter 6/14 - 2.3 sec pause. Avg HR 87. Rate control   . Bifascicular block 03/28/2013  . Bilateral congenital hammer toes   . Chronic anticoagulation 03/28/2013  . Chronic kidney disease, stage III (moderate) (HCC) 03/28/2013  . 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 Gout   . Hyperhidrosis 03/28/2013  . Obesity, unspecified 03/28/2013  . Rhabdomyolysis 09/2015  . 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 CATARACT EXTRACTION    . HERNIA REPAIR    . INGUINAL HERNIA REPAIR  07/16/2016  . INGUINAL HERNIA REPAIR Left 07/16/2016   Procedure: LAPAROSCOPIC INGUINAL HERNIA REPAIR;  Surgeon: De Blanch Kinsinger, MD;  Location: [facility] OR;  Service: General;  Laterality: Left;  . INSERTION OF MESH Left 07/16/2016   Procedure: INSERTION OF MESH;  Surgeon: Rodman Pickle, MD;  Location: [facility] OR;  Service:  General;  Laterality: Left;  . KIDNEY STONE SURGERY      Current Outpatient Medications  Medication Sig Dispense Refill  . allopurinol (ZYLOPRIM) 300 MG tablet Take 300 mg by mouth daily.     Marland Kitchen. buPROPion (WELLBUTRIN XL) 150 MG 24 hr tablet Take 150 mg by mouth daily.    . CHROMIUM-CINNAMON PO Take 200 mg by mouth at bedtime.     . cloNIDine (CATAPRES) 0.1 MG tablet Take 0.1 mg by mouth daily. At lunch.    . cloNIDine (CATAPRES) 0.2 MG tablet Take 0.2 mg by mouth 2 (two) times daily.    Marland Kitchen.  ENTRESTO 24-26 MG TAKE 1 TABLET BY MOUTH TWICE DAILY 180 tablet 3  . escitalopram (LEXAPRO) 10 MG tablet Take 10 mg by mouth daily.    . ferrous sulfate 325 (65 FE) MG tablet Take 325 mg by mouth daily with breakfast.    . fluticasone (FLONASE) 50 MCG/ACT nasal spray Place 2 sprays into both nostrils daily.     . meclizine (ANTIVERT) 12.5 MG tablet Take 12.5 mg by mouth 2 (two) times daily as needed for dizziness.    . metoprolol (LOPRESSOR) 100 MG tablet Take 200 mg by mouth 2 (two) times daily.     . Omega-3 Fatty Acids (OMEGA 3 PO) Take 1 capsule by mouth 2 (two) times daily.     . vitamin E 400 UNIT capsule Take 400 Units by mouth daily.    Marland Kitchen. warfarin (COUMADIN) 5 MG tablet TAKE AS DIRECTED BY  COUMADIN  CLINIC 40 tablet 3   No current facility-administered medications for this visit.     Allergies:    Allergies  Allergen Reactions  . Duloxetine Other (See Comments)    More nervous  . Oxycontin [Oxycodone Hcl] Nausea And Vomiting  . Sulfur Nausea And Vomiting    Social History:  The patient  reports that he quit smoking about 47 years ago. He has never used smokeless tobacco. He reports that he does not drink alcohol or use drugs.   ROS:  Please see the history of present illness.  All other review of systems negative seems to be doing quite well.   PHYSICAL EXAM: VS:  BP 130/89   Pulse 71   Ht 6' (1.829 m)   Wt 191 lb (86.6 kg)   SpO2 90%   BMI 25.90 kg/m  GEN: Well nourished, well developed, in no acute distress  HEENT: normal  Neck: no JVD, carotid bruits, or masses Cardiac: IRR; no murmurs, rubs, or gallops,no edema  Respiratory:  clear to auscultation bilaterally, normal work of breathing GI: soft, nontender, nondistended, + BS MS: no deformity or atrophy  Skin: warm and dry, no rash Neuro:  Alert and Oriented x 3, Strength and sensation are intact Psych: euthymic mood, full affect  EKG:   05/02/15-atrial fibrillation heart rate 88 bpm, right bundle branch block,  left anterior fascicular block, bifascicular block. 04/10/14-right bundle branch block, atrial fibrillation, 79, left anterior fascicular block, bifascicular block-no change from prior Atrial fibrillation heart rate 69, right bundle branch block, left anterior specific block, bifascicular block     ABIs 05/10/15-normal  ECHO 05/10/15: - Left ventricle: The cavity size was normal. There was mild  hypertrophy of the posterior wall and moderate hypertrophy of the  septal wall. Systolic function was at the lower limits of normal.  The estimated ejection fraction was in the range of 50% to 55%. - Left atrium: The atrium was severely dilated. - Pulmonary arteries:  The main pulmonary artery was normal-sized.  Systolic pressure was within the normal range, estimated to be 28  mm Hg. - Aortic root-42 mm-stable - Inferior vena cava: The vessel was normal in size. The  respirophasic diameter changes were in the normal range (= 50%),  consistent with normal central venous pressure.  10/05/17:  - Left ventricle: The cavity size was normal. There was mild   concentric hypertrophy. Systolic function was severely reduced.   The estimated ejection fraction was in the range of 20% to 25%.   Severe diffuse hypokinesis with no identifiable regional   variations. - Left atrium: The atrium was severely dilated. - Right ventricle: The cavity size was moderately to severely   dilated. Systolic function was moderately reduced. - Right atrium: The atrium was severely dilated. - Atrial septum: No defect or patent foramen ovale was identified. - Pulmonary arteries: Systolic pressure was mildly increased. PA   peak pressure: 36 mm Hg (S).  Impressions:  - Atrial fibrillation with RVR is seen throughout the study.   Doppler findings suggest severely reduced cardiac output. Right   and left ventricular systolic function are substantially   decreased compared to the 2017 study.  Labs: LDL 57, HDL 63,  hemoglobin A1c 6.4, hemoglobin 12.7, potassium 3.9 on 04/07/16  ASSESSMENT AND PLAN:  Chronic systolic heart failure -EF 25%.  Started Entresto 24/26.  Seems to be tolerating well.  Feels better.  Since his creatinine is 1.54, followed by Dr. Hyman Hopes, we will continue with current lower dosing.  Permanent atrial fibrillation  - Overall doing well with rate control with high-dose metoprolol 200 mg twice a day.  - Prior Holter monitor showed a 2.3 second pause. Stable.  No syncope.  His fall previously was secondary to losing his balance.  Seems to be under good control currently.  Essential hypertension  -Overall appreciate Megan Supple's assistance.  Continue with current regimen.  No significant changes.  Diastolic slightly elevated at today's visit.  Doing very well.  Medications reviewed.  Dilated aortic root  - 42 mm. Stable.  No change.  Continue to treat blood pressure.  Chronic kidney disease stage III  - Dr. Hyman Hopes has been monitoring.  No changes made.  Creatinine 1.58.  Chronic anticoagulation  - Coumadin. We are watching in clinic.  No bleeding.   28-month follow-up with me.   Signed, Donato Schultz, MD [facility]  11/09/2017 3:45 PM

## 2017-11-09 NOTE — Patient Instructions (Signed)
Description   Take 1.5 tablets today, then start taking 5mg  daily.  Recheck INR in 2 weeks. Continue your leafy green vegetable intake consistent and or your Boost. Call us with any medication changes or questions to Coumadin Clinic [phone].

## 2017-11-09 NOTE — Patient Instructions (Signed)
Medication Instructions:  The current medical regimen is effective;  continue present plan and medications.  Follow-Up: Follow up in 4 months with Dr Skains.  If you need a refill on your cardiac medications before your next appointment, please call your pharmacy.  Thank you for choosing [facility]!!     

## 2017-11-16 DIAGNOSIS — M1711 Unilateral primary osteoarthritis, right knee: Secondary | ICD-10-CM | POA: Diagnosis not present

## 2017-11-23 ENCOUNTER — Ambulatory Visit (INDEPENDENT_AMBULATORY_CARE_PROVIDER_SITE_OTHER): Payer: Medicare Other | Admitting: *Deleted

## 2017-11-23 DIAGNOSIS — I482 Chronic atrial fibrillation: Secondary | ICD-10-CM | POA: Diagnosis not present

## 2017-11-23 DIAGNOSIS — Z5181 Encounter for therapeutic drug level monitoring: Secondary | ICD-10-CM | POA: Diagnosis not present

## 2017-11-23 DIAGNOSIS — I481 Persistent atrial fibrillation: Secondary | ICD-10-CM

## 2017-11-23 DIAGNOSIS — Z9181 History of falling: Secondary | ICD-10-CM | POA: Diagnosis not present

## 2017-11-23 DIAGNOSIS — I5022 Chronic systolic (congestive) heart failure: Secondary | ICD-10-CM | POA: Diagnosis not present

## 2017-11-23 DIAGNOSIS — R5382 Chronic fatigue, unspecified: Secondary | ICD-10-CM | POA: Diagnosis not present

## 2017-11-23 DIAGNOSIS — E119 Type 2 diabetes mellitus without complications: Secondary | ICD-10-CM | POA: Diagnosis not present

## 2017-11-23 DIAGNOSIS — M1712 Unilateral primary osteoarthritis, left knee: Secondary | ICD-10-CM | POA: Diagnosis not present

## 2017-11-23 DIAGNOSIS — I4819 Other persistent atrial fibrillation: Secondary | ICD-10-CM

## 2017-11-23 LAB — POCT INR: INR: 3.2 — AB (ref 2.0–3.0)

## 2017-11-23 NOTE — Patient Instructions (Signed)
Description   Today take 2.5mg  (1/2 tablet) then continue taking 5mg  daily.  Recheck INR in 2 weeks. Continue your leafy green vegetable intake consistent and or your Boost. Call us with any medication changes or questions to Coumadin Clinic [phone].
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
## 2017-12-07 ENCOUNTER — Ambulatory Visit (INDEPENDENT_AMBULATORY_CARE_PROVIDER_SITE_OTHER): Payer: Medicare Other | Admitting: *Deleted

## 2017-12-07 DIAGNOSIS — Z5181 Encounter for therapeutic drug level monitoring: Secondary | ICD-10-CM

## 2017-12-07 DIAGNOSIS — I481 Persistent atrial fibrillation: Secondary | ICD-10-CM

## 2017-12-07 DIAGNOSIS — I4819 Other persistent atrial fibrillation: Secondary | ICD-10-CM

## 2017-12-07 LAB — POCT INR: INR: 1.7 — AB (ref 2.0–3.0)

## 2017-12-07 NOTE — Patient Instructions (Signed)
Today take 1.5 tablets (7.5mg ) then continue taking 5mg  daily.  Recheck INR in 2 weeks. Continue your leafy green vegetable intake consistent and or your Boost. Call us with any medication changes or questions to Coumadin Clinic [phone].

## 2017-12-21 ENCOUNTER — Ambulatory Visit (INDEPENDENT_AMBULATORY_CARE_PROVIDER_SITE_OTHER): Payer: Medicare Other | Admitting: *Deleted

## 2017-12-21 DIAGNOSIS — I481 Persistent atrial fibrillation: Secondary | ICD-10-CM | POA: Diagnosis not present

## 2017-12-21 DIAGNOSIS — Z5181 Encounter for therapeutic drug level monitoring: Secondary | ICD-10-CM

## 2017-12-21 DIAGNOSIS — I4819 Other persistent atrial fibrillation: Secondary | ICD-10-CM

## 2017-12-21 LAB — POCT INR: INR: 2.2 (ref 2.0–3.0)

## 2017-12-21 NOTE — Patient Instructions (Signed)
Description   Continue taking 5mg  daily.  Recheck INR in 3 weeks. Continue your leafy green vegetable intake consistent and or your Boost. Call us with any medication changes or questions to Coumadin Clinic [phone].

## 2017-12-29 DIAGNOSIS — N183 Chronic kidney disease, stage 3 (moderate): Secondary | ICD-10-CM | POA: Diagnosis not present

## 2017-12-29 DIAGNOSIS — D631 Anemia in chronic kidney disease: Secondary | ICD-10-CM | POA: Diagnosis not present

## 2017-12-29 DIAGNOSIS — I77819 Aortic ectasia, unspecified site: Secondary | ICD-10-CM | POA: Diagnosis not present

## 2017-12-29 DIAGNOSIS — I129 Hypertensive chronic kidney disease with stage 1 through stage 4 chronic kidney disease, or unspecified chronic kidney disease: Secondary | ICD-10-CM | POA: Diagnosis not present

## 2017-12-29 DIAGNOSIS — R972 Elevated prostate specific antigen [PSA]: Secondary | ICD-10-CM | POA: Diagnosis not present

## 2017-12-29 DIAGNOSIS — N2581 Secondary hyperparathyroidism of renal origin: Secondary | ICD-10-CM | POA: Diagnosis not present

## 2017-12-29 DIAGNOSIS — I4891 Unspecified atrial fibrillation: Secondary | ICD-10-CM | POA: Diagnosis not present

## 2017-12-29 DIAGNOSIS — N400 Enlarged prostate without lower urinary tract symptoms: Secondary | ICD-10-CM | POA: Diagnosis not present

## 2018-01-11 ENCOUNTER — Ambulatory Visit (INDEPENDENT_AMBULATORY_CARE_PROVIDER_SITE_OTHER): Payer: Medicare Other

## 2018-01-11 DIAGNOSIS — M1712 Unilateral primary osteoarthritis, left knee: Secondary | ICD-10-CM | POA: Diagnosis not present

## 2018-01-11 DIAGNOSIS — I481 Persistent atrial fibrillation: Secondary | ICD-10-CM

## 2018-01-11 DIAGNOSIS — I4819 Other persistent atrial fibrillation: Secondary | ICD-10-CM

## 2018-01-11 DIAGNOSIS — M1711 Unilateral primary osteoarthritis, right knee: Secondary | ICD-10-CM | POA: Diagnosis not present

## 2018-01-11 DIAGNOSIS — Z5181 Encounter for therapeutic drug level monitoring: Secondary | ICD-10-CM

## 2018-01-11 LAB — POCT INR: INR: 1.7 — AB (ref 2.0–3.0)

## 2018-01-11 NOTE — Patient Instructions (Signed)
Please take 2 tablets today, then continue taking 5mg  daily.  Recheck INR in 3 weeks. Continue your leafy green vegetable intake consistent and or your Boost. Call us with any medication changes or questions to Coumadin Clinic [phone].

## 2018-01-17 ENCOUNTER — Other Ambulatory Visit: Payer: Self-pay | Admitting: Cardiology

## 2018-02-01 ENCOUNTER — Ambulatory Visit (INDEPENDENT_AMBULATORY_CARE_PROVIDER_SITE_OTHER): Payer: Medicare Other | Admitting: *Deleted

## 2018-02-01 DIAGNOSIS — Z23 Encounter for immunization: Secondary | ICD-10-CM

## 2018-02-01 DIAGNOSIS — I4819 Other persistent atrial fibrillation: Secondary | ICD-10-CM | POA: Diagnosis not present

## 2018-02-01 DIAGNOSIS — Z5181 Encounter for therapeutic drug level monitoring: Secondary | ICD-10-CM | POA: Diagnosis not present

## 2018-02-01 LAB — POCT INR: INR: 2.6 (ref 2.0–3.0)

## 2018-02-01 NOTE — Patient Instructions (Signed)
Description   Continue taking 5mg  daily.  Recheck INR in 4 weeks. Continue your leafy green vegetable intake consistent and or your Boost. Call us with any medication changes or questions to Coumadin Clinic [phone].

## 2018-02-24 DIAGNOSIS — M109 Gout, unspecified: Secondary | ICD-10-CM | POA: Diagnosis not present

## 2018-03-01 ENCOUNTER — Ambulatory Visit (INDEPENDENT_AMBULATORY_CARE_PROVIDER_SITE_OTHER): Payer: Medicare Other | Admitting: *Deleted

## 2018-03-01 DIAGNOSIS — Z5181 Encounter for therapeutic drug level monitoring: Secondary | ICD-10-CM

## 2018-03-01 DIAGNOSIS — I4819 Other persistent atrial fibrillation: Secondary | ICD-10-CM | POA: Diagnosis not present

## 2018-03-01 LAB — POCT INR: INR: 2.2 (ref 2.0–3.0)

## 2018-03-01 NOTE — Patient Instructions (Signed)
Description   Continue taking 5mg daily.  Recheck INR in 4 weeks. Continue your leafy green vegetable intake consistent and or your Boost. Call us with any medication changes or questions to Coumadin Clinic #[phone].      

## 2018-03-14 ENCOUNTER — Encounter: Payer: Self-pay | Admitting: Cardiology

## 2018-03-14 ENCOUNTER — Ambulatory Visit (INDEPENDENT_AMBULATORY_CARE_PROVIDER_SITE_OTHER): Payer: Medicare Other | Admitting: Cardiology

## 2018-03-14 VITALS — BP 112/70 | HR 76 | Ht 72.0 in | Wt 187.1 lb

## 2018-03-14 DIAGNOSIS — I77810 Thoracic aortic ectasia: Secondary | ICD-10-CM

## 2018-03-14 DIAGNOSIS — I5022 Chronic systolic (congestive) heart failure: Secondary | ICD-10-CM

## 2018-03-14 DIAGNOSIS — I10 Essential (primary) hypertension: Secondary | ICD-10-CM

## 2018-03-14 DIAGNOSIS — I4819 Other persistent atrial fibrillation: Secondary | ICD-10-CM | POA: Diagnosis not present

## 2018-03-14 DIAGNOSIS — Z7901 Long term (current) use of anticoagulants: Secondary | ICD-10-CM

## 2018-03-14 MED ORDER — CLONIDINE HCL 0.1 MG PO TABS: 0.1000 mg | ORAL_TABLET | Freq: Two times a day (BID) | ORAL | 11 refills | Status: DC

## 2018-03-14 NOTE — Patient Instructions (Addendum)
Medication Instructions:  Please take Clonidine 0.1 mg twice a day. Continue all other medications as listed.  If you need a refill on your cardiac medications before your next appointment, please call your pharmacy.   Testing/Procedures: Your physician has requested that you have an echocardiogram. Echocardiography is a painless test that uses sound waves to create images of your heart. It provides your doctor with information about the size and shape of your heart and how well your heart's chambers and valves are working. This procedure takes approximately one hour. There are no restrictions for this procedure.  Follow-Up: At [facility], you and your health needs are our priority.  As part of our continuing mission to provide you with exceptional heart care, we have created designated Provider Care Teams.  These Care Teams include your primary Cardiologist (physician) and Advanced Practice Providers (APPs -  Physician Assistants and Nurse Practitioners) who all work together to provide you with the care you need, when you need it. You will need a follow up appointment in 4 months.  Please call our office 2 months in advance to schedule this appointment.  You may see Donato SchultzMark Skains, MD or one of the following Advanced Practice Providers on your designated Care Team:   Norma FredricksonLori Gerhardt, NP Nada BoozerLaura Ingold, NP . Georgie ChardJill McDaniel, NP  Thank you for choosing [facility]!!

## 2018-03-14 NOTE — Progress Notes (Signed)
Cardiology Office Note:    Date:  03/14/2018   ID:  KAIDE GAGE, DOB 1946-04-09, MRN [id number]  PCP:  Lorenda Ishihara, MD  Cardiologist:  Donato Schultz, MD  Electrophysiologist:  None   Referring MD: Lorenda Ishihara,*     History of Present Illness:    Ralph Sullivan is a 72 y.o. male here for follow-up of permanent atrial fibrillation, right bundle branch block, left anterior fascicular block, bifascicular block.  Widowed, wife was [facility]  Back in 2014, aortic root dimension was 4.5 cm.  Sherryll Burger was started for cardiomyopathy EF 25% discovered in June 2019.  Overall his breathing has been better.  Renal and he will feel his heart racing.  He also is feeling fatigue, slightly worse.  Wonders if he is on too much clonidine.  Clonidine was being utilized by our hypertension clinic because of his difficult to control hypertension in the past.  He is utilizing a walker because of disequilibrium.  No fevers chills nausea vomiting syncope.    Past Medical History:  Diagnosis Date  . Atrial fibrillation (HCC) 02/08/2013   CHADS-VAS - 2 (AGE, HTN). On anticoagulation. Holter 6/14 - 2.3 sec pause. Avg HR 87. Rate control   . Bifascicular block 03/28/2013  . Bilateral congenital hammer toes   . Chronic anticoagulation 03/28/2013  . Chronic kidney disease, stage III (moderate) (HCC) 03/28/2013  . 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 Gout   . Hyperhidrosis 03/28/2013  . Obesity, unspecified 03/28/2013  . Rhabdomyolysis 09/2015  . 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 CATARACT EXTRACTION    . HERNIA REPAIR    . INGUINAL HERNIA REPAIR  07/16/2016  . INGUINAL HERNIA REPAIR Left 07/16/2016   Procedure: LAPAROSCOPIC INGUINAL HERNIA REPAIR;  Surgeon: De Blanch Kinsinger, MD;  Location: [facility] OR;  Service: General;  Laterality: Left;  .  INSERTION OF MESH Left 07/16/2016   Procedure: INSERTION OF MESH;  Surgeon: Rodman Pickle, MD;  Location: [facility] OR;  Service: General;  Laterality: Left;  . KIDNEY STONE SURGERY      Current Medications: Current Meds  Medication Sig  . allopurinol (ZYLOPRIM) 300 MG tablet Take 300 mg by mouth daily.   Marland Kitchen buPROPion (WELLBUTRIN XL) 150 MG 24 hr tablet Take 150 mg by mouth daily.  . CHROMIUM-CINNAMON PO Take 200 mg by mouth at bedtime.   . cloNIDine (CATAPRES) 0.1 MG tablet Take 1 tablet (0.1 mg total) by mouth 2 (two) times daily.  Marland Kitchen ENTRESTO 24-26 MG TAKE 1 TABLET BY MOUTH TWICE DAILY  . escitalopram (LEXAPRO) 10 MG tablet Take 10 mg by mouth daily.  . ferrous sulfate 325 (65 FE) MG tablet Take 325 mg by mouth daily with breakfast.  . fluticasone (FLONASE) 50 MCG/ACT nasal spray Place 2 sprays into both nostrils daily.   . meclizine (ANTIVERT) 12.5 MG tablet Take 12.5 mg by mouth 2 (two) times daily as needed for dizziness.  . metoprolol (LOPRESSOR) 100 MG tablet Take 200 mg by mouth 2 (two) times daily.   . Omega-3 Fatty Acids (OMEGA 3 PO) Take 1 capsule by mouth 2 (two) times daily.   . vitamin E 400 UNIT capsule Take 400 Units by mouth daily.  Marland Kitchen warfarin (COUMADIN) 5 MG tablet TAKE AS DIRECTED BY  COUMADIN  CLINIC  . [DISCONTINUED] cloNIDine (CATAPRES) 0.1 MG tablet Take  0.1 mg by mouth daily. At lunch.  . [DISCONTINUED] cloNIDine (CATAPRES) 0.2 MG tablet Take 0.2 mg by mouth 2 (two) times daily.     Allergies:   Duloxetine; Oxycontin [oxycodone hcl]; and Sulfur   Social History   Socioeconomic History  . Marital status: Married    Spouse name: Not on file  . Number of children: Not on file  . Years of education: Not on file  . Highest education level: Not on file  Occupational History  . Not on file  Social Needs  . Financial resource strain: Not on file  . Food insecurity:    Worry: Not on file    Inability: Not on file  . Transportation needs:    Medical: Not on file     Non-medical: Not on file  Tobacco Use  . Smoking status: Former Smoker    Last attempt to quit: 03/1970    Years since quitting: 47.9  . Smokeless tobacco: Never Used  Substance and Sexual Activity  . Alcohol use: No    Alcohol/week: 0.0 standard drinks  . Drug use: No  . Sexual activity: Not on file  Lifestyle  . Physical activity:    Days per week: Not on file    Minutes per session: Not on file  . Stress: Not on file  Relationships  . Social connections:    Talks on phone: Not on file    Gets together: Not on file    Attends religious service: Not on file    Active member of club or organization: Not on file    Attends meetings of clubs or organizations: Not on file    Relationship status: Not on file  Other Topics Concern  . Not on file  Social History Narrative  . Not on file     Family History: The patient's family history includes Diabetes in his mother; Hypertension in his mother.  ROS:   Please see the history of present illness.    Denies any fevers chills nausea vomiting syncope bleeding all other systems reviewed and are negative.  EKGs/Labs/Other Studies Reviewed:    The following studies were reviewed today: Echoes, EKG, office notes reviewed  10/05/2017-EF 20 to 25%.  Newly reduced from 55%.  EKG:  EKG is  ordered today.  The ekg ordered today demonstrates 03/14/2018-atrial fibrillation 76 right bundle branch block left anterior fascicular block no significant changes from prior. 05/02/15-atrial fibrillation heart rate 88 bpm, right bundle branch block, left anterior fascicular block, bifascicular block. 04/10/14-right bundle branch block, atrial fibrillation, 79, left anterior fascicular block, bifascicular block-no change from prior Atrial fibrillation heart rate 69, right bundle branch block, left anterior specific block, bifascicular block     Recent Labs: 04/01/2017: Hemoglobin 14.6; Platelets 177 10/19/2017: BUN 19; Creatinine, Ser 1.39; Potassium  4.3; Sodium 130  Recent Lipid Panel No results found for: CHOL, TRIG, HDL, CHOLHDL, VLDL, LDLCALC, LDLDIRECT  Physical Exam:    VS:  BP 112/70   Pulse 76   Ht 6' (1.829 m)   Wt 187 lb 1.9 oz (84.9 kg)   BMI 25.38 kg/m     Wt Readings from Last 3 Encounters:  03/14/18 187 lb 1.9 oz (84.9 kg)  11/09/17 191 lb (86.6 kg)  09/13/17 188 lb 12.8 oz (85.6 kg)     GEN:  Well nourished, well developed in no acute distress HEENT: Normal NECK: No JVD; No carotid bruits LYMPHATICS: No lymphadenopathy CARDIAC: RRR, no murmurs, rubs, gallops RESPIRATORY:  Clear to  auscultation without rales, wheezing or rhonchi  ABDOMEN: Soft, non-tender, non-distended MUSCULOSKELETAL:  No edema; No deformity  SKIN: Warm and dry NEUROLOGIC:  Alert and oriented x 3 PSYCHIATRIC:  Normal affect   ASSESSMENT:    1. Persistent atrial fibrillation   2. Dilated aortic root (HCC)   3. Essential hypertension   4. Chronic anticoagulation   5. Chronic systolic heart failure (HCC)    PLAN:    In order of problems listed above:  Permanent atrial fibrillation with right bundle branch block - Rate controlled 76.  No changes made to medications.  No syncope.  Toprol all  Chronic systolic heart failure -Entresto, metoprolol.  Feeling better.  Creatinine 1.39, potassium 4.3, Dr. Hyman HopesWebb is monitoring him as well.  Essential hypertension - Also has clonidine listed on his medications.  His blood pressure is 112/70.  Chronic kidney disease stage III with hyperkalemia -No changes made.  Dr. Hyman HopesWebb has been monitoring.  Potassium was slightly high.  He is going to recheck this.  On Entresto.  Avoiding bananas, orange juice.  Dilated aortic root - 42 to 45 mm stable over the past few years.  Avoid Cipro.  Essential hypertension - Megan supple and pharmacy department has seen him.  Excellent control currently.  Medication Adjustments/Labs and Tests Ordered: Current medicines are reviewed at length with the patient  today.  Concerns regarding medicines are outlined above.  Orders Placed This Encounter  Procedures  . ECHOCARDIOGRAM COMPLETE   Meds ordered this encounter  Medications  . cloNIDine (CATAPRES) 0.1 MG tablet    Sig: Take 1 tablet (0.1 mg total) by mouth 2 (two) times daily.    Dispense:  60 tablet    Refill:  11    Patient Instructions  Medication Instructions:  Please take Clonidine 0.1 mg twice a day. Continue all other medications as listed.  If you need a refill on your cardiac medications before your next appointment, please call your pharmacy.   Testing/Procedures: Your physician has requested that you have an echocardiogram. Echocardiography is a painless test that uses sound waves to create images of your heart. It provides your doctor with information about the size and shape of your heart and how well your heart's chambers and valves are working. This procedure takes approximately one hour. There are no restrictions for this procedure.  Follow-Up: At [facility], you and your health needs are our priority.  As part of our continuing mission to provide you with exceptional heart care, we have created designated Provider Care Teams.  These Care Teams include your primary Cardiologist (physician) and Advanced Practice Providers (APPs -  Physician Assistants and Nurse Practitioners) who all work together to provide you with the care you need, when you need it. You will need a follow up appointment in 4 months.  Please call our office 2 months in advance to schedule this appointment.  You may see Donato SchultzMark Cecillia Menees, MD or one of the following Advanced Practice Providers on your designated Care Team:   Norma FredricksonLori Gerhardt, NP Nada BoozerLaura Ingold, NP . Georgie ChardJill McDaniel, NP  Thank you for choosing [facility]!!         Signed, Donato SchultzMark Westly Hinnant, MD  03/14/2018 10:35 AM    [facility]

## 2018-03-15 NOTE — Addendum Note (Signed)
Addended by: Louanne BeltonMENDOZA MENDEZ, Kalik Hoare A on: 03/15/2018 04:53 PM   Modules accepted: Orders

## 2018-03-15 NOTE — Addendum Note (Signed)
Addended by: Louanne BeltonMENDOZA MENDEZ, Shakeya Kerkman A on: 03/15/2018 04:56 PM   Modules accepted: Orders

## 2018-03-18 ENCOUNTER — Telehealth: Payer: Self-pay

## 2018-03-18 ENCOUNTER — Ambulatory Visit (HOSPITAL_COMMUNITY): Payer: Medicare Other | Attending: Cardiology

## 2018-03-18 ENCOUNTER — Other Ambulatory Visit: Payer: Self-pay

## 2018-03-18 DIAGNOSIS — I5022 Chronic systolic (congestive) heart failure: Secondary | ICD-10-CM | POA: Insufficient documentation

## 2018-03-18 DIAGNOSIS — I77810 Thoracic aortic ectasia: Secondary | ICD-10-CM

## 2018-03-18 MED ORDER — PERFLUTREN LIPID MICROSPHERE
1.0000 mL | INTRAVENOUS | Status: AC | PRN
  Administered 2018-03-18: 2 mL via INTRAVENOUS

## 2018-03-18 NOTE — Telephone Encounter (Signed)
Patient notified of result.  Please refer to phone note from today for complete details.   Echo ordered for 1 year Lattie HawBrittany I Adriona Kaney, RN 03/18/2018 4:32 PM

## 2018-03-18 NOTE — Telephone Encounter (Signed)
-----   Message from Jake BatheMark C Skains, MD sent at 03/18/2018  4:02 PM EST ----- Aortic root is stable at 44-7845mm. Please repeat ECHO in one year.  Normal EF.  Reassuring.  Donato SchultzMark Skains, MD

## 2018-03-29 ENCOUNTER — Ambulatory Visit (INDEPENDENT_AMBULATORY_CARE_PROVIDER_SITE_OTHER): Payer: Medicare Other | Admitting: *Deleted

## 2018-03-29 DIAGNOSIS — I4819 Other persistent atrial fibrillation: Secondary | ICD-10-CM

## 2018-03-29 DIAGNOSIS — Z5181 Encounter for therapeutic drug level monitoring: Secondary | ICD-10-CM

## 2018-03-29 LAB — POCT INR: INR: 2.9 (ref 2.0–3.0)

## 2018-03-29 NOTE — Patient Instructions (Signed)
Description   Continue taking 5mg  daily. Recheck INR in 5 weeks. Continue your leafy green vegetable intake consistent and or your Boost. Call us with any medication changes or questions to [facility]in Clinic [phone].

## 2018-04-08 DIAGNOSIS — M1712 Unilateral primary osteoarthritis, left knee: Secondary | ICD-10-CM | POA: Diagnosis not present

## 2018-04-13 DIAGNOSIS — N400 Enlarged prostate without lower urinary tract symptoms: Secondary | ICD-10-CM | POA: Diagnosis not present

## 2018-04-13 DIAGNOSIS — R972 Elevated prostate specific antigen [PSA]: Secondary | ICD-10-CM | POA: Diagnosis not present

## 2018-04-13 DIAGNOSIS — I129 Hypertensive chronic kidney disease with stage 1 through stage 4 chronic kidney disease, or unspecified chronic kidney disease: Secondary | ICD-10-CM | POA: Diagnosis not present

## 2018-04-13 DIAGNOSIS — D631 Anemia in chronic kidney disease: Secondary | ICD-10-CM | POA: Diagnosis not present

## 2018-04-13 DIAGNOSIS — N183 Chronic kidney disease, stage 3 (moderate): Secondary | ICD-10-CM | POA: Diagnosis not present

## 2018-04-13 DIAGNOSIS — I4891 Unspecified atrial fibrillation: Secondary | ICD-10-CM | POA: Diagnosis not present

## 2018-04-13 DIAGNOSIS — I77819 Aortic ectasia, unspecified site: Secondary | ICD-10-CM | POA: Diagnosis not present

## 2018-04-13 DIAGNOSIS — N2581 Secondary hyperparathyroidism of renal origin: Secondary | ICD-10-CM | POA: Diagnosis not present

## 2018-04-14 DIAGNOSIS — M1712 Unilateral primary osteoarthritis, left knee: Secondary | ICD-10-CM | POA: Diagnosis not present

## 2018-04-18 ENCOUNTER — Telehealth: Payer: Self-pay | Admitting: Cardiology

## 2018-04-18 NOTE — Telephone Encounter (Signed)
Spoke with patient whose court date is 05/17/2018.  He reports he needs the letter within 10 days.  Advised Dr Anne FuSkains is not in the office again until next Monday.  Advised to call PCP.  He thanked me for the call and information.

## 2018-04-18 NOTE — Telephone Encounter (Signed)
New Message   Pt states he was called into jury duty but he can barely walk and wants to know if Dr. Anne FuSkains can write a letter. Please call

## 2018-04-21 DIAGNOSIS — M1712 Unilateral primary osteoarthritis, left knee: Secondary | ICD-10-CM | POA: Diagnosis not present

## 2018-05-03 ENCOUNTER — Ambulatory Visit (INDEPENDENT_AMBULATORY_CARE_PROVIDER_SITE_OTHER): Payer: Medicare Other

## 2018-05-03 DIAGNOSIS — I4819 Other persistent atrial fibrillation: Secondary | ICD-10-CM | POA: Diagnosis not present

## 2018-05-03 DIAGNOSIS — Z5181 Encounter for therapeutic drug level monitoring: Secondary | ICD-10-CM | POA: Diagnosis not present

## 2018-05-03 LAB — POCT INR: INR: 2.6 (ref 2.0–3.0)

## 2018-05-03 NOTE — Patient Instructions (Signed)
Continue taking 5mg  daily. Recheck INR in 6 weeks. Continue your leafy green vegetable intake consistent and or your Boost. Call us with any medication changes or questions to [facility]in Clinic [phone].

## 2018-05-17 DIAGNOSIS — M109 Gout, unspecified: Secondary | ICD-10-CM | POA: Diagnosis not present

## 2018-05-17 DIAGNOSIS — N183 Chronic kidney disease, stage 3 (moderate): Secondary | ICD-10-CM | POA: Diagnosis not present

## 2018-05-17 DIAGNOSIS — I129 Hypertensive chronic kidney disease with stage 1 through stage 4 chronic kidney disease, or unspecified chronic kidney disease: Secondary | ICD-10-CM | POA: Diagnosis not present

## 2018-05-31 DIAGNOSIS — E1169 Type 2 diabetes mellitus with other specified complication: Secondary | ICD-10-CM | POA: Diagnosis not present

## 2018-05-31 DIAGNOSIS — N183 Chronic kidney disease, stage 3 (moderate): Secondary | ICD-10-CM | POA: Diagnosis not present

## 2018-05-31 DIAGNOSIS — I129 Hypertensive chronic kidney disease with stage 1 through stage 4 chronic kidney disease, or unspecified chronic kidney disease: Secondary | ICD-10-CM | POA: Diagnosis not present

## 2018-05-31 DIAGNOSIS — Z0000 Encounter for general adult medical examination without abnormal findings: Secondary | ICD-10-CM | POA: Diagnosis not present

## 2018-05-31 DIAGNOSIS — R269 Unspecified abnormalities of gait and mobility: Secondary | ICD-10-CM | POA: Diagnosis not present

## 2018-05-31 DIAGNOSIS — F418 Other specified anxiety disorders: Secondary | ICD-10-CM | POA: Diagnosis not present

## 2018-05-31 DIAGNOSIS — E785 Hyperlipidemia, unspecified: Secondary | ICD-10-CM | POA: Diagnosis not present

## 2018-05-31 DIAGNOSIS — M1712 Unilateral primary osteoarthritis, left knee: Secondary | ICD-10-CM | POA: Diagnosis not present

## 2018-05-31 DIAGNOSIS — I6930 Unspecified sequelae of cerebral infarction: Secondary | ICD-10-CM | POA: Diagnosis not present

## 2018-05-31 DIAGNOSIS — I5022 Chronic systolic (congestive) heart failure: Secondary | ICD-10-CM | POA: Diagnosis not present

## 2018-05-31 DIAGNOSIS — D508 Other iron deficiency anemias: Secondary | ICD-10-CM | POA: Diagnosis not present

## 2018-05-31 DIAGNOSIS — F411 Generalized anxiety disorder: Secondary | ICD-10-CM | POA: Diagnosis not present

## 2018-05-31 DIAGNOSIS — Z1389 Encounter for screening for other disorder: Secondary | ICD-10-CM | POA: Diagnosis not present

## 2018-06-12 DIAGNOSIS — F329 Major depressive disorder, single episode, unspecified: Secondary | ICD-10-CM | POA: Diagnosis not present

## 2018-06-12 DIAGNOSIS — I482 Chronic atrial fibrillation, unspecified: Secondary | ICD-10-CM | POA: Diagnosis not present

## 2018-06-12 DIAGNOSIS — I5022 Chronic systolic (congestive) heart failure: Secondary | ICD-10-CM | POA: Diagnosis not present

## 2018-06-12 DIAGNOSIS — E669 Obesity, unspecified: Secondary | ICD-10-CM | POA: Diagnosis not present

## 2018-06-12 DIAGNOSIS — Z9181 History of falling: Secondary | ICD-10-CM | POA: Diagnosis not present

## 2018-06-12 DIAGNOSIS — M1991 Primary osteoarthritis, unspecified site: Secondary | ICD-10-CM | POA: Diagnosis not present

## 2018-06-12 DIAGNOSIS — I130 Hypertensive heart and chronic kidney disease with heart failure and stage 1 through stage 4 chronic kidney disease, or unspecified chronic kidney disease: Secondary | ICD-10-CM | POA: Diagnosis not present

## 2018-06-12 DIAGNOSIS — N200 Calculus of kidney: Secondary | ICD-10-CM | POA: Diagnosis not present

## 2018-06-12 DIAGNOSIS — M75101 Unspecified rotator cuff tear or rupture of right shoulder, not specified as traumatic: Secondary | ICD-10-CM | POA: Diagnosis not present

## 2018-06-12 DIAGNOSIS — Z6827 Body mass index (BMI) 27.0-27.9, adult: Secondary | ICD-10-CM | POA: Diagnosis not present

## 2018-06-12 DIAGNOSIS — Z87891 Personal history of nicotine dependence: Secondary | ICD-10-CM | POA: Diagnosis not present

## 2018-06-12 DIAGNOSIS — M109 Gout, unspecified: Secondary | ICD-10-CM | POA: Diagnosis not present

## 2018-06-12 DIAGNOSIS — F411 Generalized anxiety disorder: Secondary | ICD-10-CM | POA: Diagnosis not present

## 2018-06-12 DIAGNOSIS — Z7901 Long term (current) use of anticoagulants: Secondary | ICD-10-CM | POA: Diagnosis not present

## 2018-06-12 DIAGNOSIS — Z8673 Personal history of transient ischemic attack (TIA), and cerebral infarction without residual deficits: Secondary | ICD-10-CM | POA: Diagnosis not present

## 2018-06-12 DIAGNOSIS — N183 Chronic kidney disease, stage 3 (moderate): Secondary | ICD-10-CM | POA: Diagnosis not present

## 2018-06-12 DIAGNOSIS — E119 Type 2 diabetes mellitus without complications: Secondary | ICD-10-CM | POA: Diagnosis not present

## 2018-06-14 ENCOUNTER — Ambulatory Visit (INDEPENDENT_AMBULATORY_CARE_PROVIDER_SITE_OTHER): Payer: Medicare Other | Admitting: Pharmacist

## 2018-06-14 DIAGNOSIS — Z5181 Encounter for therapeutic drug level monitoring: Secondary | ICD-10-CM | POA: Diagnosis not present

## 2018-06-14 DIAGNOSIS — I4819 Other persistent atrial fibrillation: Secondary | ICD-10-CM

## 2018-06-14 LAB — POCT INR: INR: 4.5 — AB (ref 2.0–3.0)

## 2018-06-14 NOTE — Patient Instructions (Signed)
Description   Skip your Coumadin today and tomorrow, then continue taking 5mg  daily. Recheck INR in 2 weeks. Continue your leafy green vegetable intake consistent and or your Boost. Call us with any medication changes or questions to Coumadin Clinic [phone].

## 2018-06-16 DIAGNOSIS — I130 Hypertensive heart and chronic kidney disease with heart failure and stage 1 through stage 4 chronic kidney disease, or unspecified chronic kidney disease: Secondary | ICD-10-CM | POA: Diagnosis not present

## 2018-06-16 DIAGNOSIS — I5022 Chronic systolic (congestive) heart failure: Secondary | ICD-10-CM | POA: Diagnosis not present

## 2018-06-16 DIAGNOSIS — N183 Chronic kidney disease, stage 3 (moderate): Secondary | ICD-10-CM | POA: Diagnosis not present

## 2018-06-16 DIAGNOSIS — I482 Chronic atrial fibrillation, unspecified: Secondary | ICD-10-CM | POA: Diagnosis not present

## 2018-06-16 DIAGNOSIS — E119 Type 2 diabetes mellitus without complications: Secondary | ICD-10-CM | POA: Diagnosis not present

## 2018-06-16 DIAGNOSIS — M1991 Primary osteoarthritis, unspecified site: Secondary | ICD-10-CM | POA: Diagnosis not present

## 2018-06-21 DIAGNOSIS — I130 Hypertensive heart and chronic kidney disease with heart failure and stage 1 through stage 4 chronic kidney disease, or unspecified chronic kidney disease: Secondary | ICD-10-CM | POA: Diagnosis not present

## 2018-06-21 DIAGNOSIS — E119 Type 2 diabetes mellitus without complications: Secondary | ICD-10-CM | POA: Diagnosis not present

## 2018-06-21 DIAGNOSIS — N183 Chronic kidney disease, stage 3 (moderate): Secondary | ICD-10-CM | POA: Diagnosis not present

## 2018-06-21 DIAGNOSIS — I482 Chronic atrial fibrillation, unspecified: Secondary | ICD-10-CM | POA: Diagnosis not present

## 2018-06-21 DIAGNOSIS — I5022 Chronic systolic (congestive) heart failure: Secondary | ICD-10-CM | POA: Diagnosis not present

## 2018-06-21 DIAGNOSIS — M1991 Primary osteoarthritis, unspecified site: Secondary | ICD-10-CM | POA: Diagnosis not present

## 2018-06-23 DIAGNOSIS — M1991 Primary osteoarthritis, unspecified site: Secondary | ICD-10-CM | POA: Diagnosis not present

## 2018-06-23 DIAGNOSIS — E119 Type 2 diabetes mellitus without complications: Secondary | ICD-10-CM | POA: Diagnosis not present

## 2018-06-23 DIAGNOSIS — I482 Chronic atrial fibrillation, unspecified: Secondary | ICD-10-CM | POA: Diagnosis not present

## 2018-06-23 DIAGNOSIS — N183 Chronic kidney disease, stage 3 (moderate): Secondary | ICD-10-CM | POA: Diagnosis not present

## 2018-06-23 DIAGNOSIS — I5022 Chronic systolic (congestive) heart failure: Secondary | ICD-10-CM | POA: Diagnosis not present

## 2018-06-23 DIAGNOSIS — I130 Hypertensive heart and chronic kidney disease with heart failure and stage 1 through stage 4 chronic kidney disease, or unspecified chronic kidney disease: Secondary | ICD-10-CM | POA: Diagnosis not present

## 2018-06-27 ENCOUNTER — Other Ambulatory Visit: Payer: Self-pay | Admitting: Cardiology

## 2018-06-27 ENCOUNTER — Ambulatory Visit (INDEPENDENT_AMBULATORY_CARE_PROVIDER_SITE_OTHER): Payer: Medicare Other

## 2018-06-27 DIAGNOSIS — I4819 Other persistent atrial fibrillation: Secondary | ICD-10-CM | POA: Diagnosis not present

## 2018-06-27 DIAGNOSIS — Z5181 Encounter for therapeutic drug level monitoring: Secondary | ICD-10-CM

## 2018-06-27 LAB — POCT INR: INR: 2.3 (ref 2.0–3.0)

## 2018-06-27 NOTE — Patient Instructions (Signed)
Description   Continue on same dosage 5mg  daily. Recheck INR in 3 weeks. Continue your leafy green vegetable intake consistent and or your Boost. Call us with any medication changes or questions to Coumadin Clinic [phone].

## 2018-06-28 ENCOUNTER — Telehealth: Payer: Self-pay | Admitting: Pharmacist

## 2018-06-28 DIAGNOSIS — M1991 Primary osteoarthritis, unspecified site: Secondary | ICD-10-CM | POA: Diagnosis not present

## 2018-06-28 DIAGNOSIS — I5022 Chronic systolic (congestive) heart failure: Secondary | ICD-10-CM | POA: Diagnosis not present

## 2018-06-28 DIAGNOSIS — E119 Type 2 diabetes mellitus without complications: Secondary | ICD-10-CM | POA: Diagnosis not present

## 2018-06-28 DIAGNOSIS — N183 Chronic kidney disease, stage 3 (moderate): Secondary | ICD-10-CM | POA: Diagnosis not present

## 2018-06-28 DIAGNOSIS — I482 Chronic atrial fibrillation, unspecified: Secondary | ICD-10-CM | POA: Diagnosis not present

## 2018-06-28 DIAGNOSIS — I130 Hypertensive heart and chronic kidney disease with heart failure and stage 1 through stage 4 chronic kidney disease, or unspecified chronic kidney disease: Secondary | ICD-10-CM | POA: Diagnosis not present

## 2018-06-28 MED ORDER — WARFARIN SODIUM 5 MG PO TABS: ORAL_TABLET | ORAL | 1 refills | Status: DC

## 2018-06-30 NOTE — Telephone Encounter (Signed)
Refill sent to [facility]. 

## 2018-07-01 DIAGNOSIS — I5022 Chronic systolic (congestive) heart failure: Secondary | ICD-10-CM | POA: Diagnosis not present

## 2018-07-01 DIAGNOSIS — M1991 Primary osteoarthritis, unspecified site: Secondary | ICD-10-CM | POA: Diagnosis not present

## 2018-07-01 DIAGNOSIS — I482 Chronic atrial fibrillation, unspecified: Secondary | ICD-10-CM | POA: Diagnosis not present

## 2018-07-01 DIAGNOSIS — I130 Hypertensive heart and chronic kidney disease with heart failure and stage 1 through stage 4 chronic kidney disease, or unspecified chronic kidney disease: Secondary | ICD-10-CM | POA: Diagnosis not present

## 2018-07-01 DIAGNOSIS — N183 Chronic kidney disease, stage 3 (moderate): Secondary | ICD-10-CM | POA: Diagnosis not present

## 2018-07-01 DIAGNOSIS — E119 Type 2 diabetes mellitus without complications: Secondary | ICD-10-CM | POA: Diagnosis not present

## 2018-07-07 DIAGNOSIS — I482 Chronic atrial fibrillation, unspecified: Secondary | ICD-10-CM | POA: Diagnosis not present

## 2018-07-07 DIAGNOSIS — I5022 Chronic systolic (congestive) heart failure: Secondary | ICD-10-CM | POA: Diagnosis not present

## 2018-07-07 DIAGNOSIS — N183 Chronic kidney disease, stage 3 (moderate): Secondary | ICD-10-CM | POA: Diagnosis not present

## 2018-07-07 DIAGNOSIS — I130 Hypertensive heart and chronic kidney disease with heart failure and stage 1 through stage 4 chronic kidney disease, or unspecified chronic kidney disease: Secondary | ICD-10-CM | POA: Diagnosis not present

## 2018-07-07 DIAGNOSIS — M1991 Primary osteoarthritis, unspecified site: Secondary | ICD-10-CM | POA: Diagnosis not present

## 2018-07-07 DIAGNOSIS — E119 Type 2 diabetes mellitus without complications: Secondary | ICD-10-CM | POA: Diagnosis not present

## 2018-07-12 DIAGNOSIS — Z7901 Long term (current) use of anticoagulants: Secondary | ICD-10-CM | POA: Diagnosis not present

## 2018-07-12 DIAGNOSIS — E669 Obesity, unspecified: Secondary | ICD-10-CM | POA: Diagnosis not present

## 2018-07-12 DIAGNOSIS — Z87891 Personal history of nicotine dependence: Secondary | ICD-10-CM | POA: Diagnosis not present

## 2018-07-12 DIAGNOSIS — I130 Hypertensive heart and chronic kidney disease with heart failure and stage 1 through stage 4 chronic kidney disease, or unspecified chronic kidney disease: Secondary | ICD-10-CM | POA: Diagnosis not present

## 2018-07-12 DIAGNOSIS — M1991 Primary osteoarthritis, unspecified site: Secondary | ICD-10-CM | POA: Diagnosis not present

## 2018-07-12 DIAGNOSIS — Z8673 Personal history of transient ischemic attack (TIA), and cerebral infarction without residual deficits: Secondary | ICD-10-CM | POA: Diagnosis not present

## 2018-07-12 DIAGNOSIS — F329 Major depressive disorder, single episode, unspecified: Secondary | ICD-10-CM | POA: Diagnosis not present

## 2018-07-12 DIAGNOSIS — M75101 Unspecified rotator cuff tear or rupture of right shoulder, not specified as traumatic: Secondary | ICD-10-CM | POA: Diagnosis not present

## 2018-07-12 DIAGNOSIS — F411 Generalized anxiety disorder: Secondary | ICD-10-CM | POA: Diagnosis not present

## 2018-07-12 DIAGNOSIS — N200 Calculus of kidney: Secondary | ICD-10-CM | POA: Diagnosis not present

## 2018-07-12 DIAGNOSIS — Z6827 Body mass index (BMI) 27.0-27.9, adult: Secondary | ICD-10-CM | POA: Diagnosis not present

## 2018-07-12 DIAGNOSIS — M109 Gout, unspecified: Secondary | ICD-10-CM | POA: Diagnosis not present

## 2018-07-12 DIAGNOSIS — I5022 Chronic systolic (congestive) heart failure: Secondary | ICD-10-CM | POA: Diagnosis not present

## 2018-07-12 DIAGNOSIS — Z9181 History of falling: Secondary | ICD-10-CM | POA: Diagnosis not present

## 2018-07-12 DIAGNOSIS — E119 Type 2 diabetes mellitus without complications: Secondary | ICD-10-CM | POA: Diagnosis not present

## 2018-07-12 DIAGNOSIS — N183 Chronic kidney disease, stage 3 (moderate): Secondary | ICD-10-CM | POA: Diagnosis not present

## 2018-07-12 DIAGNOSIS — I482 Chronic atrial fibrillation, unspecified: Secondary | ICD-10-CM | POA: Diagnosis not present

## 2018-07-14 ENCOUNTER — Telehealth: Payer: Self-pay

## 2018-07-14 NOTE — Telephone Encounter (Signed)
   Cardiac Questionnaire:    Since your last visit or hospitalization:    1. Have you been having new or worsening chest pain? No   2. Have you been having new or worsening shortness of breath? No 3. Have you been having new or worsening leg swelling, wt gain, or increase in abdominal girth (pants fitting more tightly)? No   4. Have you had any passing out spells? No  The patient has not exhibited any of the above symptoms, but states his HR has fluctuated between 50 and 130 bpm lately and he has been very tired. He requests to keep his appointment.    *A YES to any of these questions would result in the appointment being kept. *If all the answers to these questions are NO, we should indicate that given the current situation regarding the worldwide coronarvirus pandemic, at the recommendation of the CDC, we are looking to limit gatherings in our waiting area, and thus will reschedule their appointment beyond four weeks from today.   _____________   COVID-19 Pre-Screening Questions:  . Have you recently travelled abroad or to [place], [place] state, or [place]? No (4) . Do you currently have a fever? No (4) . Have you been in contact with someone that is currently pending confirmation of Covid19 testing or has been confirmed to have the Covid19 virus?  No (4) . Are you currently experiencing fatigue or cough? Fatigue (2) . Are you currently experiencing new or worsening shortness of breath at rest or with minimal activity? No (1) . Have you been in contact with someone that was recently sick with fever/cough/fatigue? No (1)   **A score of 4 or more should result in cancellation of the pts cardiology appt.  **A score of  2 should be provided a mask prior to admission into the lobby  *Travel to a high risk area or contact with a confirmed case should stay at home,  away from confirmed patient, monitor symptoms, and reach out to PCP for e-visit,  additional testing.  *ALL PTS W/ FEVER  SHOULD BE REFERRED TO PCP FOR E-VISIT*

## 2018-07-18 ENCOUNTER — Ambulatory Visit: Payer: Medicare Other | Admitting: Cardiology

## 2018-07-19 DIAGNOSIS — I5022 Chronic systolic (congestive) heart failure: Secondary | ICD-10-CM | POA: Diagnosis not present

## 2018-07-19 DIAGNOSIS — E119 Type 2 diabetes mellitus without complications: Secondary | ICD-10-CM | POA: Diagnosis not present

## 2018-07-19 DIAGNOSIS — I482 Chronic atrial fibrillation, unspecified: Secondary | ICD-10-CM | POA: Diagnosis not present

## 2018-07-19 DIAGNOSIS — N183 Chronic kidney disease, stage 3 (moderate): Secondary | ICD-10-CM | POA: Diagnosis not present

## 2018-07-19 DIAGNOSIS — I130 Hypertensive heart and chronic kidney disease with heart failure and stage 1 through stage 4 chronic kidney disease, or unspecified chronic kidney disease: Secondary | ICD-10-CM | POA: Diagnosis not present

## 2018-07-19 DIAGNOSIS — M1991 Primary osteoarthritis, unspecified site: Secondary | ICD-10-CM | POA: Diagnosis not present

## 2018-08-19 ENCOUNTER — Telehealth: Payer: Self-pay

## 2018-08-19 NOTE — Telephone Encounter (Signed)

## 2018-08-22 ENCOUNTER — Ambulatory Visit (INDEPENDENT_AMBULATORY_CARE_PROVIDER_SITE_OTHER): Payer: Medicare Other | Admitting: *Deleted

## 2018-08-22 ENCOUNTER — Telehealth: Payer: Self-pay | Admitting: *Deleted

## 2018-08-22 ENCOUNTER — Other Ambulatory Visit: Payer: Self-pay

## 2018-08-22 DIAGNOSIS — I4819 Other persistent atrial fibrillation: Secondary | ICD-10-CM

## 2018-08-22 DIAGNOSIS — Z5181 Encounter for therapeutic drug level monitoring: Secondary | ICD-10-CM | POA: Diagnosis not present

## 2018-08-22 LAB — POCT INR: INR: 1.7 — AB (ref 2.0–3.0)

## 2018-08-22 NOTE — Patient Instructions (Signed)
Description   Spoke with pt and instructed pt to take 7.5mg  today then continue on same dosage 5mg  daily. Recheck INR in 3 weeks. Continue your leafy green vegetable intake consistent and or your Boost. Call us with any medication changes or questions to Coumadin Clinic [phone].

## 2018-08-22 NOTE — Telephone Encounter (Signed)
Did call and speak to patient regarding his upcoming appt.  He is aware we are only doing virtual or phone appts at this time.  He gave verbal consent for a phone visit when I spoke with him this past Friday 08/19/2018.    YOUR CARDIOLOGY TEAM HAS ARRANGED FOR AN E-VISIT FOR YOUR APPOINTMENT - PLEASE REVIEW IMPORTANT INFORMATION BELOW SEVERAL DAYS PRIOR TO YOUR APPOINTMENT  Due to the recent COVID-19 pandemic, we are transitioning in-person office visits to tele-medicine visits in an effort to decrease unnecessary exposure to our patients, their families, and staff. These visits are billed to your insurance just like a normal visit is. We also encourage you to sign up for MyChart if you have not already done so. You will need a smartphone if possible. For patients that do not have this, we can still complete the visit using a regular telephone but do prefer a smartphone to enable video when possible. You may have a family member that lives with you that can help. If possible, we also ask that you have a blood pressure cuff and scale at home to measure your blood pressure, heart rate and weight prior to your scheduled appointment. Patients with clinical needs that need an in-person evaluation and testing will still be able to come to the office if absolutely necessary. If you have any questions, feel free to call our office.  PHONE VISIT  2-3 DAYS BEFORE YOUR APPOINTMENT  You will receive a telephone call from one of our [facility] team members - your caller ID may say "Unknown caller." If this is a video visit, we will walk you through how to get the video launched on your phone. We will remind you check your blood pressure, heart rate and weight prior to your scheduled appointment. If you have an Apple Watch or Kardia, please upload any pertinent ECG strips the day before or morning of your appointment to MyChart. Our staff will also make sure you have reviewed the consent and agree to move forward with  your scheduled tele-health visit.   THE DAY OF YOUR APPOINTMENT  Approximately 15 minutes prior to your scheduled appointment, you will receive a telephone call from one of Heart[facility] team - your caller ID may say "Unknown caller."  Our staff will confirm medications, vital signs for the day and any symptoms you may be experiencing. Please have this information available prior to the time of visit start. It may also be helpful for you to have a pad of paper and pen handy for any instructions given during your visit. They will also walk you through joining the smartphone meeting if this is a video visit.  CONSENT FOR TELE-HEALTH VISIT - PLEASE REVIEW  I hereby voluntarily request, consent and authorize [facility] and its employed or contracted physicians, physician assistants, nurse practitioners or other licensed health care professionals (the Practitioner), to provide me with telemedicine health care services (the "Services") as deemed necessary by the treating Practitioner. I acknowledge and consent to receive the Services by the Practitioner via telemedicine. I understand that the telemedicine visit will involve communicating with the Practitioner through live audiovisual communication technology and the disclosure of certain medical information by electronic transmission. I acknowledge that I have been given the opportunity to request an in-person assessment or other available alternative prior to the telemedicine visit and am voluntarily participating in the telemedicine visit.  I understand that I have the right to withhold or withdraw my consent to the use of telemedicine  in the course of my care at any time, without affecting my right to future care or treatment, and that the Practitioner or I may terminate the telemedicine visit at any time. I understand that I have the right to inspect all information obtained and/or recorded in the course of the telemedicine visit and may receive copies of  available information for a reasonable fee.  I understand that some of the potential risks of receiving the Services via telemedicine include:  Marland Kitchen Delay or interruption in medical evaluation due to technological equipment failure or disruption; . Information transmitted may not be sufficient (e.g. poor resolution of images) to allow for appropriate medical decision making by the Practitioner; and/or  . In rare instances, security protocols could fail, causing a breach of personal health information.  Furthermore, I acknowledge that it is my responsibility to provide information about my medical history, conditions and care that is complete and accurate to the best of my ability. I acknowledge that Practitioner's advice, recommendations, and/or decision may be based on factors not within their control, such as incomplete or inaccurate data provided by me or distortions of diagnostic images or specimens that may result from electronic transmissions. I understand that the practice of medicine is not an exact science and that Practitioner makes no warranties or guarantees regarding treatment outcomes. I acknowledge that I will receive a copy of this consent concurrently upon execution via email to the email address I last provided but may also request a printed copy by calling the office of [facility].    I understand that my insurance will be billed for this visit.   I have read or had this consent read to me. . I understand the contents of this consent, which adequately explains the benefits and risks of the Services being provided via telemedicine.  . I have been provided ample opportunity to ask questions regarding this consent and the Services and have had my questions answered to my satisfaction. . I give my informed consent for the services to be provided through the use of telemedicine in my medical care  By participating in this telemedicine visit I agree to the above.   Reviewed above  information regarding verbal consent for upcoming PHONE appt.  Pt stated understanding.

## 2018-08-23 DIAGNOSIS — N183 Chronic kidney disease, stage 3 (moderate): Secondary | ICD-10-CM | POA: Diagnosis not present

## 2018-08-23 DIAGNOSIS — D508 Other iron deficiency anemias: Secondary | ICD-10-CM | POA: Diagnosis not present

## 2018-08-23 DIAGNOSIS — E1169 Type 2 diabetes mellitus with other specified complication: Secondary | ICD-10-CM | POA: Diagnosis not present

## 2018-08-23 DIAGNOSIS — I10 Essential (primary) hypertension: Secondary | ICD-10-CM | POA: Diagnosis not present

## 2018-08-23 DIAGNOSIS — M19049 Primary osteoarthritis, unspecified hand: Secondary | ICD-10-CM | POA: Diagnosis not present

## 2018-08-23 DIAGNOSIS — E785 Hyperlipidemia, unspecified: Secondary | ICD-10-CM | POA: Diagnosis not present

## 2018-08-23 DIAGNOSIS — I5022 Chronic systolic (congestive) heart failure: Secondary | ICD-10-CM | POA: Diagnosis not present

## 2018-08-23 DIAGNOSIS — F329 Major depressive disorder, single episode, unspecified: Secondary | ICD-10-CM | POA: Diagnosis not present

## 2018-08-23 DIAGNOSIS — I129 Hypertensive chronic kidney disease with stage 1 through stage 4 chronic kidney disease, or unspecified chronic kidney disease: Secondary | ICD-10-CM | POA: Diagnosis not present

## 2018-08-23 DIAGNOSIS — E1322 Other specified diabetes mellitus with diabetic chronic kidney disease: Secondary | ICD-10-CM | POA: Diagnosis not present

## 2018-08-23 DIAGNOSIS — M170 Bilateral primary osteoarthritis of knee: Secondary | ICD-10-CM | POA: Diagnosis not present

## 2018-08-30 ENCOUNTER — Encounter: Payer: Self-pay | Admitting: Cardiology

## 2018-08-30 ENCOUNTER — Telehealth (INDEPENDENT_AMBULATORY_CARE_PROVIDER_SITE_OTHER): Payer: Medicare Other | Admitting: Cardiology

## 2018-08-30 ENCOUNTER — Other Ambulatory Visit: Payer: Self-pay

## 2018-08-30 VITALS — BP 133/88 | HR 88 | Ht 72.0 in | Wt 187.0 lb

## 2018-08-30 DIAGNOSIS — I5022 Chronic systolic (congestive) heart failure: Secondary | ICD-10-CM | POA: Diagnosis not present

## 2018-08-30 DIAGNOSIS — I4821 Permanent atrial fibrillation: Secondary | ICD-10-CM

## 2018-08-30 DIAGNOSIS — I77810 Thoracic aortic ectasia: Secondary | ICD-10-CM

## 2018-08-30 DIAGNOSIS — Z7901 Long term (current) use of anticoagulants: Secondary | ICD-10-CM

## 2018-08-30 NOTE — Progress Notes (Signed)
Virtual Visit via Telephone Note   This visit type was conducted due to national recommendations for restrictions regarding the COVID-19 Pandemic (e.g. social distancing) in an effort to limit this patient's exposure and mitigate transmission in our community.  Due to his co-morbid illnesses, this patient is at least at moderate risk for complications without adequate follow up.  This format is felt to be most appropriate for this patient at this time.  The patient did not have access to video technology/had technical difficulties with video requiring transitioning to audio format only (telephone).  All issues noted in this document were discussed and addressed.  No physical exam could be performed with this format.  Please refer to the patient's chart for his  consent to telehealth for [facility].   Date:  08/30/2018   ID:  Ralph Sullivan, DOB [birth year], MRN [id number]  Patient Location: Home Provider Location: Home  PCP:  Lorenda Ishihara, MD  Cardiologist:  Donato Schultz, MD  Electrophysiologist:  None   Evaluation Performed:  Follow-Up Visit  Chief Complaint: Heart failure follow-up  History of Present Illness:    Ralph Sullivan is a 73 y.o. 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  Feels better.  We pulled back on the clonidine, utilized by hypertension clinic.  Disequilibrium, has had a fall, uses walker.  Minimal dizziness when standing up at times.  Blood pressure has been in the 130s to 140s he states.  Aortic root 4.5 cm back in 2014.  Legs feel heavy at times 133/88  See below for further details  The patient does not have symptoms concerning for COVID-19 infection (fever, chills, cough, or new shortness of breath).    Past Medical History:  Diagnosis Date  . Atrial fibrillation (HCC) 02/08/2013   CHADS-VAS - 2 (AGE, HTN). On anticoagulation. Holter 6/14 - 2.3 sec pause. Avg HR 87. Rate control   . Bifascicular block  03/28/2013  . Bilateral congenital hammer toes   . Chronic anticoagulation 03/28/2013  . Chronic kidney disease, stage III (moderate) (HCC) 03/28/2013  . 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 Gout   . Hyperhidrosis 03/28/2013  . Obesity, unspecified 03/28/2013  . Rhabdomyolysis 09/2015  . 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 CATARACT EXTRACTION    . HERNIA REPAIR    . INGUINAL HERNIA REPAIR  07/16/2016  . INGUINAL HERNIA REPAIR Left 07/16/2016   Procedure: LAPAROSCOPIC INGUINAL HERNIA REPAIR;  Surgeon: De Blanch Kinsinger, MD;  Location: [facility] OR;  Service: General;  Laterality: Left;  . INSERTION OF MESH Left 07/16/2016   Procedure: INSERTION OF MESH;  Surgeon: Rodman Pickle, MD;  Location: [facility] OR;  Service: General;  Laterality: Left;  . KIDNEY STONE SURGERY       Current Meds  Medication Sig  . allopurinol (ZYLOPRIM) 300 MG tablet Take 300 mg by mouth daily.   Marland Kitchen buPROPion (WELLBUTRIN SR) 150 MG 12 hr tablet Take 150 mg by mouth daily.  . CHROMIUM-CINNAMON PO Take 200 mg by mouth at bedtime.   . cloNIDine (CATAPRES) 0.1 MG tablet Take 1 tablet (0.1 mg total) by mouth 2 (two) times daily.  Marland Kitchen ENTRESTO 24-26 MG TAKE 1 TABLET BY MOUTH TWICE DAILY  . escitalopram (LEXAPRO) 10 MG tablet Take 10 mg by mouth daily.  . ferrous sulfate 325 (65  FE) MG tablet Take 325 mg by mouth daily with breakfast.  . fluticasone (FLONASE) 50 MCG/ACT nasal spray Place 2 sprays into both nostrils daily.   . metoprolol (LOPRESSOR) 100 MG tablet Take 200 mg by mouth 2 (two) times daily.   . Omega-3 Fatty Acids (OMEGA 3 PO) Take 1 capsule by mouth 2 (two) times daily.   . vitamin E 400 UNIT capsule Take 400 Units by mouth daily.  Marland Kitchen warfarin (COUMADIN) 5 MG tablet TAKE AS DIRECTED BY  COUMADIN  CLINIC     Allergies:   Duloxetine; Oxycontin [oxycodone hcl]; and Sulfur   Social  History   Tobacco Use  . Smoking status: Former Smoker    Last attempt to quit: 03/1970    Years since quitting: 48.4  . Smokeless tobacco: Never Used  Substance Use Topics  . Alcohol use: No    Alcohol/week: 0.0 standard drinks  . Drug use: No     Family Hx: The patient's family history includes Diabetes in his mother; Hypertension in his mother.  ROS:   Please see the history of present illness.    Denies any fevers chills nausea vomiting syncope bleeding All other systems reviewed and are negative.   Prior CV studies:   The following studies were reviewed today:  Echo June 2019 EF 25%  Labs/Other Tests and Data Reviewed:    EKG:  An ECG dated 03/14/18 was personally reviewed today and demonstrated:  Atrial fibrillation with bifascicular block heart rate 76  Recent Labs: 10/19/2017: BUN 19; Creatinine, Ser 1.39; Potassium 4.3; Sodium 130   Recent Lipid Panel No results found for: CHOL, TRIG, HDL, CHOLHDL, LDLCALC, LDLDIRECT  Wt Readings from Last 3 Encounters:  08/30/18 187 lb (84.8 kg)  03/14/18 187 lb 1.9 oz (84.9 kg)  11/09/17 191 lb (86.6 kg)     Objective:    Vital Signs:  BP 133/88   Pulse 88   Ht 6' (1.829 m)   Wt 187 lb (84.8 kg)   BMI 25.36 kg/m    VITAL SIGNS:  reviewed Able to complete full sentences without difficulty, no respiratory distress, alert and oriented x3, pleasant  ASSESSMENT & PLAN:    Chronic systolic heart failure/dilated cardiomyopathy -On Entresto, metoprolol, doing quite well.  Last creatinine in the 1.4 range.  Dr. Hyman Hopes has been monitoring.  No changes made today. -If his blood pressure remains in the 130 range, he certainly could tolerate an increase in Entresto to medium dose.  We will revisit at next clinic follow-up.  Permanent atrial fibrillation - Currently rate controlled, metoprolol. -Warfarin for anticoagulation, no bleeding  Chronic kidney disease stage III with prior hyperkalemia - Continuing to monitor.  Has  seen Dr. Hyman Hopes in the past.  Dilated aortic root -Avoid Cipro, 42 to 45 mm.  We will repeat echocardiogram likely at next visit in 4 months  Right bundle branch block -Stable, no syncope  Essential hypertension -Previously difficult to control.  Discussed the possibility of stopping the clonidine but he states that his blood pressure gets quite high without it.  When we increase his Entresto hopefully at next clinic visit, we may need to decrease the clonidine or stop it.  COVID-19 Education: The signs and symptoms of COVID-19 were discussed with the patient and how to seek care for testing (follow up with PCP or arrange E-visit).  The importance of social distancing was discussed today.  Time:   Today, I have spent 15 minutes with the patient with telehealth technology  discussing the above problems.     Medication Adjustments/Labs and Tests Ordered: Current medicines are reviewed at length with the patient today.  Concerns regarding medicines are outlined above.   Tests Ordered: No orders of the defined types were placed in this encounter.   Medication Changes: No orders of the defined types were placed in this encounter.   Disposition:  Follow up in 4 month(s)  Signed, Donato SchultzMark Skains, MD  08/30/2018 10:22 AM    [facility]

## 2018-08-30 NOTE — Patient Instructions (Addendum)
Medication Instructions:  The current medical regimen is effective;  continue present plan and medications.  If you need a refill on your cardiac medications before your next appointment, please call your pharmacy.   Follow-Up: Follow up in 4 months with Dr Anne Fu as scheduled.  Thank you for choosing [facility]!!

## 2018-09-09 ENCOUNTER — Telehealth: Payer: Self-pay

## 2018-09-09 NOTE — Telephone Encounter (Signed)

## 2018-09-12 ENCOUNTER — Other Ambulatory Visit: Payer: Self-pay

## 2018-09-12 ENCOUNTER — Other Ambulatory Visit: Payer: Self-pay | Admitting: Pharmacist

## 2018-09-12 ENCOUNTER — Ambulatory Visit (INDEPENDENT_AMBULATORY_CARE_PROVIDER_SITE_OTHER): Payer: Medicare Other | Admitting: Pharmacist

## 2018-09-12 DIAGNOSIS — I4819 Other persistent atrial fibrillation: Secondary | ICD-10-CM | POA: Diagnosis not present

## 2018-09-12 DIAGNOSIS — Z5181 Encounter for therapeutic drug level monitoring: Secondary | ICD-10-CM | POA: Diagnosis not present

## 2018-09-12 LAB — POCT INR
INR: 5.1 — AB (ref 2.0–3.0)
INR: 5.1 — AB (ref 2.0–3.0)

## 2018-09-20 DIAGNOSIS — M109 Gout, unspecified: Secondary | ICD-10-CM | POA: Diagnosis not present

## 2018-09-23 ENCOUNTER — Telehealth: Payer: Self-pay

## 2018-09-23 NOTE — Telephone Encounter (Signed)
lmom for prescreen  

## 2018-09-26 ENCOUNTER — Ambulatory Visit (INDEPENDENT_AMBULATORY_CARE_PROVIDER_SITE_OTHER): Payer: Medicare Other | Admitting: Pharmacist

## 2018-09-26 ENCOUNTER — Other Ambulatory Visit: Payer: Self-pay

## 2018-09-26 DIAGNOSIS — Z5181 Encounter for therapeutic drug level monitoring: Secondary | ICD-10-CM

## 2018-09-26 DIAGNOSIS — I4819 Other persistent atrial fibrillation: Secondary | ICD-10-CM | POA: Diagnosis not present

## 2018-09-26 LAB — POCT INR: INR: 2.5 (ref 2.0–3.0)

## 2018-09-26 NOTE — Patient Instructions (Signed)
Continue on same dosage, 5mg  (1 tablet) daily. Recheck INR in 3 weeks. Continue your leafy green vegetable intake consistent and or your Boost. Call us with any medication changes or questions to Coumadin Clinic [phone].

## 2018-09-29 DIAGNOSIS — M10062 Idiopathic gout, left knee: Secondary | ICD-10-CM | POA: Diagnosis not present

## 2018-10-10 ENCOUNTER — Telehealth: Payer: Self-pay

## 2018-10-10 NOTE — Telephone Encounter (Signed)

## 2018-10-17 ENCOUNTER — Other Ambulatory Visit: Payer: Self-pay

## 2018-10-17 ENCOUNTER — Ambulatory Visit (INDEPENDENT_AMBULATORY_CARE_PROVIDER_SITE_OTHER): Payer: Medicare Other

## 2018-10-17 DIAGNOSIS — I4819 Other persistent atrial fibrillation: Secondary | ICD-10-CM

## 2018-10-17 DIAGNOSIS — Z5181 Encounter for therapeutic drug level monitoring: Secondary | ICD-10-CM

## 2018-10-17 LAB — POCT INR: INR: 2 (ref 2.0–3.0)

## 2018-10-17 NOTE — Patient Instructions (Signed)
Description   Continue on same dosage, 5mg  (1 tablet) daily. Recheck INR in 4 weeks. Continue your leafy green vegetable intake consistent and or your Boost. Call us with any medication changes or questions to Coumadin Clinic [phone].

## 2018-10-25 DIAGNOSIS — M170 Bilateral primary osteoarthritis of knee: Secondary | ICD-10-CM | POA: Diagnosis not present

## 2018-11-01 DIAGNOSIS — M170 Bilateral primary osteoarthritis of knee: Secondary | ICD-10-CM | POA: Diagnosis not present

## 2018-11-08 DIAGNOSIS — M170 Bilateral primary osteoarthritis of knee: Secondary | ICD-10-CM | POA: Diagnosis not present

## 2018-11-10 ENCOUNTER — Telehealth: Payer: Self-pay

## 2018-11-10 NOTE — Telephone Encounter (Signed)

## 2018-11-12 ENCOUNTER — Other Ambulatory Visit: Payer: Self-pay | Admitting: Cardiology

## 2018-11-14 ENCOUNTER — Other Ambulatory Visit: Payer: Self-pay

## 2018-11-14 ENCOUNTER — Ambulatory Visit (INDEPENDENT_AMBULATORY_CARE_PROVIDER_SITE_OTHER): Payer: Medicare Other | Admitting: *Deleted

## 2018-11-14 DIAGNOSIS — Z5181 Encounter for therapeutic drug level monitoring: Secondary | ICD-10-CM | POA: Diagnosis not present

## 2018-11-14 DIAGNOSIS — I4819 Other persistent atrial fibrillation: Secondary | ICD-10-CM | POA: Diagnosis not present

## 2018-11-14 LAB — POCT INR: INR: 2.4 (ref 2.0–3.0)

## 2018-11-14 NOTE — Patient Instructions (Signed)
Description   Continue on same dosage, 5mg  (1 tablet) daily. Recheck INR in 6 weeks. Continue your leafy green vegetable intake consistent and or your Boost. Call us with any medication changes or questions to Coumadin Clinic [phone].

## 2018-12-06 DIAGNOSIS — E785 Hyperlipidemia, unspecified: Secondary | ICD-10-CM | POA: Diagnosis not present

## 2018-12-06 DIAGNOSIS — G894 Chronic pain syndrome: Secondary | ICD-10-CM | POA: Diagnosis not present

## 2018-12-06 DIAGNOSIS — E1169 Type 2 diabetes mellitus with other specified complication: Secondary | ICD-10-CM | POA: Diagnosis not present

## 2018-12-06 DIAGNOSIS — N183 Chronic kidney disease, stage 3 (moderate): Secondary | ICD-10-CM | POA: Diagnosis not present

## 2018-12-06 DIAGNOSIS — I10 Essential (primary) hypertension: Secondary | ICD-10-CM | POA: Diagnosis not present

## 2018-12-20 DIAGNOSIS — M170 Bilateral primary osteoarthritis of knee: Secondary | ICD-10-CM | POA: Diagnosis not present

## 2018-12-20 DIAGNOSIS — M1712 Unilateral primary osteoarthritis, left knee: Secondary | ICD-10-CM | POA: Diagnosis not present

## 2018-12-20 DIAGNOSIS — M1711 Unilateral primary osteoarthritis, right knee: Secondary | ICD-10-CM | POA: Diagnosis not present

## 2018-12-26 ENCOUNTER — Other Ambulatory Visit: Payer: Self-pay

## 2018-12-26 ENCOUNTER — Ambulatory Visit (INDEPENDENT_AMBULATORY_CARE_PROVIDER_SITE_OTHER): Payer: Medicare Other | Admitting: Pharmacist

## 2018-12-26 DIAGNOSIS — I4819 Other persistent atrial fibrillation: Secondary | ICD-10-CM | POA: Diagnosis not present

## 2018-12-26 DIAGNOSIS — Z5181 Encounter for therapeutic drug level monitoring: Secondary | ICD-10-CM | POA: Diagnosis not present

## 2018-12-26 LAB — POCT INR: INR: 1.5 — AB (ref 2.0–3.0)

## 2018-12-26 NOTE — Patient Instructions (Signed)
Description   Take 1.5 tablets today and tomorrow, then continue on same dosage, 5mg  (1 tablet) daily. Recheck INR in 2 weeks, same day as Dr. Marlou Porch. Continue your leafy green vegetable intake consistent and or your Boost. Call us with any medication changes or questions to Coumadin Clinic [phone].

## 2019-01-09 ENCOUNTER — Encounter: Payer: Self-pay | Admitting: Cardiology

## 2019-01-09 ENCOUNTER — Ambulatory Visit (INDEPENDENT_AMBULATORY_CARE_PROVIDER_SITE_OTHER): Payer: Medicare Other | Admitting: Cardiology

## 2019-01-09 ENCOUNTER — Other Ambulatory Visit: Payer: Self-pay

## 2019-01-09 ENCOUNTER — Ambulatory Visit (INDEPENDENT_AMBULATORY_CARE_PROVIDER_SITE_OTHER): Payer: Medicare Other | Admitting: *Deleted

## 2019-01-09 VITALS — BP 120/80 | HR 56 | Ht 72.0 in | Wt 205.4 lb

## 2019-01-09 DIAGNOSIS — I4819 Other persistent atrial fibrillation: Secondary | ICD-10-CM

## 2019-01-09 DIAGNOSIS — I77810 Thoracic aortic ectasia: Secondary | ICD-10-CM

## 2019-01-09 DIAGNOSIS — R002 Palpitations: Secondary | ICD-10-CM

## 2019-01-09 DIAGNOSIS — I10 Essential (primary) hypertension: Secondary | ICD-10-CM | POA: Diagnosis not present

## 2019-01-09 DIAGNOSIS — Z5181 Encounter for therapeutic drug level monitoring: Secondary | ICD-10-CM

## 2019-01-09 DIAGNOSIS — I5022 Chronic systolic (congestive) heart failure: Secondary | ICD-10-CM

## 2019-01-09 LAB — POCT INR: INR: 2.6 (ref 2.0–3.0)

## 2019-01-09 NOTE — Patient Instructions (Signed)
Description   Continue taking 5mg  (1 tablet) daily. Recheck INR in 3 weeks. Continue your leafy green vegetable intake consistent and or your Boost. Call us with any medication changes or questions to Coumadin Clinic [phone].

## 2019-01-09 NOTE — Patient Instructions (Addendum)
Your physician recommends that you continue on your current medications as directed. Please refer to the Current Medication list given to you today.  Your physician has recommended that you wear an event monitor. Event monitors are medical devices that record the heart's electrical activity. Doctors most often [place] these monitors to diagnose arrhythmias. Arrhythmias are problems with the speed or rhythm of the heartbeat. The monitor is a small, portable device. You can wear one while you do your normal daily activities. This is usually used to diagnose what is causing palpitations/syncope (passing out). [place] has requested that you have an echocardiogram. Echocardiography is a painless test that uses sound waves to create images of your heart. It provides your doctor with information about the size and shape of your heart and how well your heart's chambers and valves are working. This procedure takes approximately one hour. There are no restrictions for this procedure.  DUE IN NOVEMBER  Your physician wants you to follow-up in: [place] will receive a reminder letter in the mail two months in advance. If you don't receive a letter, please call our office to schedule the follow-up appointment.

## 2019-01-09 NOTE — Progress Notes (Signed)
Cardiology Office Note:    Date:  01/09/2019   ID:  KANON RAUSCH, DOB 08/19/1945, MRN [id number]  PCP:  Lorenda Ishihara, MD  Cardiologist:  Donato Schultz, MD  Electrophysiologist:  None   Referring MD: Lorenda Ishihara,*     History of Present Illness:    Ralph Sullivan is a 73 y.o. male here for follow-up of permanent atrial fibrillation, right bundle branch block, left anterior fascicular block, bifascicular block.  Widowed, wife was [facility]  Back in 2014, aortic root dimension was 4.5 cm.  Sherryll Burger was started for cardiomyopathy EF 25% discovered in June 2019.  Overall his breathing has been better.  Renal and he will feel his heart racing.  He also is feeling fatigue, slightly worse.  Wonders if he is on too much clonidine.  Clonidine was being utilized by our hypertension clinic because of his difficult to control hypertension in the past.  He is utilizing a walker because of disequilibrium.  No fevers chills nausea vomiting syncope.  01/09/2019-here for follow-up of cardiomyopathy.  Last visit was a virtual visit, he was fairly stable at the time.  Denies any significant fevers chills nausea vomiting shortness of breath.  Does have ongoing fatigue. Sometimes numb hands and feet.  He does have good distal pulses.  No syncope.  Sometimes heart rate can be in the mid 40s on pulse oximeter.  Takes a while to register.  Atrial fibrillation.  Past Medical History:  Diagnosis Date  . Atrial fibrillation (HCC) 02/08/2013   CHADS-VAS - 2 (AGE, HTN). On anticoagulation. Holter 6/14 - 2.3 sec pause. Avg HR 87. Rate control   . Bifascicular block 03/28/2013  . Bilateral congenital hammer toes   . Chronic anticoagulation 03/28/2013  . Chronic kidney disease, stage III (moderate) (HCC) 03/28/2013  . 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 Gout   . Hyperhidrosis 03/28/2013  . Obesity, unspecified 03/28/2013  .  Rhabdomyolysis 09/2015  . 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 CATARACT EXTRACTION    . HERNIA REPAIR    . INGUINAL HERNIA REPAIR  07/16/2016  . INGUINAL HERNIA REPAIR Left 07/16/2016   Procedure: LAPAROSCOPIC INGUINAL HERNIA REPAIR;  Surgeon: De Blanch Kinsinger, MD;  Location: [facility] OR;  Service: General;  Laterality: Left;  . INSERTION OF MESH Left 07/16/2016   Procedure: INSERTION OF MESH;  Surgeon: Rodman Pickle, MD;  Location: [facility] OR;  Service: General;  Laterality: Left;  . KIDNEY STONE SURGERY      Current Medications: Current Meds  Medication Sig  . allopurinol (ZYLOPRIM) 300 MG tablet Take 300 mg by mouth daily.   Marland Kitchen buPROPion (WELLBUTRIN SR) 150 MG 12 hr tablet Take 150 mg by mouth daily.  . CHROMIUM-CINNAMON PO Take 200 mg by mouth at bedtime.   . cloNIDine (CATAPRES) 0.1 MG tablet Take 1 tablet (0.1 mg total) by mouth 2 (two) times daily.  Marland Kitchen ENTRESTO 24-26 MG Take 1 tablet by mouth twice daily  . escitalopram (LEXAPRO) 10 MG tablet Take 10 mg by mouth daily.  . ferrous sulfate 325 (65 FE) MG tablet Take 325 mg by mouth daily with breakfast.  . fluticasone (FLONASE) 50 MCG/ACT nasal spray Place 2 sprays into both nostrils daily.   . metoprolol (LOPRESSOR) 100 MG tablet Take 200 mg by mouth 2 (two) times daily.   . Omega-3 Fatty  Acids (OMEGA 3 PO) Take 1 capsule by mouth 2 (two) times daily.   . vitamin E 400 UNIT capsule Take 400 Units by mouth daily.  Marland Kitchen. warfarin (COUMADIN) 5 MG tablet TAKE AS DIRECTED BY  COUMADIN  CLINIC     Allergies:   Duloxetine, Oxycontin [oxycodone hcl], and Sulfur   Social History   Socioeconomic History  . Marital status: Married    Spouse name: Not on file  . Number of children: Not on file  . Years of education: Not on file  . Highest education level: Not on file  Occupational History  . Not on file  Social Needs  . Financial resource strain: Not on file  .  Food insecurity    Worry: Not on file    Inability: Not on file  . Transportation needs    Medical: Not on file    Non-medical: Not on file  Tobacco Use  . Smoking status: Former Smoker    Quit date: 03/1970    Years since quitting: 48.8  . Smokeless tobacco: Never Used  Substance and Sexual Activity  . Alcohol use: No    Alcohol/week: 0.0 standard drinks  . Drug use: No  . Sexual activity: Not on file  Lifestyle  . Physical activity    Days per week: Not on file    Minutes per session: Not on file  . Stress: Not on file  Relationships  . Social Musicianconnections    Talks on phone: Not on file    Gets together: Not on file    Attends religious service: Not on file    Active member of club or organization: Not on file    Attends meetings of clubs or organizations: Not on file    Relationship status: Not on file  Other Topics Concern  . Not on file  Social History Narrative  . Not on file     Family History: The patient's family history includes Diabetes in his mother; Hypertension in his mother.  ROS:   Please see the history of present illness.    Denies any fevers chills nausea vomiting syncope bleeding all other systems reviewed and are negative.  EKGs/Labs/Other Studies Reviewed:    The following studies were reviewed today: Echoes, EKG, office notes reviewed  10/05/2017-EF 20 to 25%.  Newly reduced from 55%.  02/2018 - Left ventricle: The cavity size was normal. Wall thickness was   increased in a pattern of mild LVH. Systolic function was normal.   The estimated ejection fraction was in the range of 50% to 55%.   Wall motion was normal; there were no regional wall motion   abnormalities. - Aortic valve: There was trivial regurgitation. - Aortic root: The aortic root was mildly dilated. - Ascending aorta: The ascending aorta was mildly dilated. - Mitral valve: There was mild regurgitation. - Left atrium: The atrium was moderately dilated. - Pulmonary arteries:  Systolic pressure was mildly increased.  Impressions:  - Definity used; normal LV systolic function; mild LVH; mildly   dilated ascending aorta (44 mm; suggest CTA or MRA to further   assess); trace AI; mild MR; moderate LAE; mild TR with mild   pulmonary hypertension.  EKG:  03/14/2018-atrial fibrillation 76 right bundle branch block left anterior fascicular block no significant changes from prior. 05/02/15-atrial fibrillation heart rate 88 bpm, right bundle branch block, left anterior fascicular block, bifascicular block. 04/10/14-right bundle branch block, atrial fibrillation, 79, left anterior fascicular block, bifascicular block-no change from prior Atrial  fibrillation heart rate 69, right bundle branch block, left anterior specific block, bifascicular block     Recent Labs: No results found for requested labs within last 8760 hours.  Recent Lipid Panel No results found for: CHOL, TRIG, HDL, CHOLHDL, VLDL, LDLCALC, LDLDIRECT  Physical Exam:    VS:  BP 120/80   Pulse (!) 56   Ht 6' (1.829 m)   Wt 205 lb 6.4 oz (93.2 kg)   SpO2 98%   BMI 27.86 kg/m     Wt Readings from Last 3 Encounters:  01/09/19 205 lb 6.4 oz (93.2 kg)  08/30/18 187 lb (84.8 kg)  03/14/18 187 lb 1.9 oz (84.9 kg)     GEN: Well nourished, well developed, in no acute distress  HEENT: normal  Neck: no JVD, carotid bruits, or masses Cardiac: irreg normal rate; no murmurs, rubs, or gallops,no edema  Respiratory:  clear to auscultation bilaterally, normal work of breathing GI: soft, nontender, nondistended, + BS MS: no deformity or atrophy  Skin: warm and dry, no rash Neuro:  Alert and Oriented x 3, Strength and sensation are intact Psych: euthymic mood, full affect   ASSESSMENT:    1. Dilated aortic root (HCC)   2. Palpitations   3. Chronic systolic heart failure (HCC)   4. Persistent atrial fibrillation   5. Essential hypertension    PLAN:    In order of problems listed above:  Permanent atrial  fibrillation with right bundle branch block - Rate controlled.  No changes made to medications.  No syncope.  Metoprolol 200 twice a day.  Heart rate on pulse ox sometimes is in the 40s.  When I listen to him it does not sound this low.  He has had rapid heart rate in the past which likely led to tachycardia mediated cardiomyopathy.  Chronic systolic heart failure -Entresto, metoprolol.  Feeling better.  Creatinine 1.5, potassium 4.3, Dr. Justin Mend is monitoring him as well.  No changes made  Essential hypertension - Also has clonidine listed on his medications.  His blood pressure is 112/70.  He pulled back on the clonidine to 0.1.  Chronic kidney disease stage III with hyperkalemia -No changes made.  Dr. Justin Mend has been monitoring.  Potassium was slightly high.  He is going to recheck this.  On Entresto.  Avoiding bananas, orange juice.  No changes  Dilated aortic root - 42 to 45 mm stable over the past few years.  Avoid Cipro.  Repeating echocardiogram in November.  Essential hypertension - Megan supple and pharmacy department has seen him.  Excellent control currently.  Doing very well.  Medication Adjustments/Labs and Tests Ordered: Current medicines are reviewed at length with the patient today.  Concerns regarding medicines are outlined above.  Orders Placed This Encounter  Procedures  . LONG TERM MONITOR (3-14 DAYS)  . ECHOCARDIOGRAM COMPLETE   No orders of the defined types were placed in this encounter.   Patient Instructions  Your physician recommends that you continue on your current medications as directed. Please refer to the Current Medication list given to you today.  Your physician has recommended that you wear an event monitor. Event monitors are medical devices that record the heart's electrical activity. Doctors most often [place] these monitors to diagnose arrhythmias. Arrhythmias are problems with the speed or rhythm of the heartbeat. The monitor is a small, portable device.  You can wear one while you do your normal daily activities. This is usually used to diagnose what is causing palpitations/syncope (passing  out). 3 DAY ZIO PATCH  Your physician has requested that you have an echocardiogram. Echocardiography is a painless test that uses sound waves to create images of your heart. It provides your doctor with information about the size and shape of your heart and how well your heart's chambers and valves are working. This procedure takes approximately one hour. There are no restrictions for this procedure.  DUE IN NOVEMBER  Your physician wants you to follow-up in: 6 MONTHS WITH DR Anne FuSKAINS You will receive a reminder letter in the mail two months in advance. If you don't receive a letter, please call our office to schedule the follow-up appointment.     Signed, Donato SchultzMark Jandi Swiger, MD  01/09/2019 10:20 AM    [facility]

## 2019-01-16 ENCOUNTER — Other Ambulatory Visit (HOSPITAL_COMMUNITY): Payer: Medicare Other

## 2019-01-16 ENCOUNTER — Telehealth: Payer: Self-pay

## 2019-01-16 NOTE — Telephone Encounter (Signed)
Left detailed message to let the pt know I have ordered his 3 day Zio. Advised pt to call with any questions once he receives it.

## 2019-01-23 ENCOUNTER — Ambulatory Visit (INDEPENDENT_AMBULATORY_CARE_PROVIDER_SITE_OTHER): Payer: Medicare Other

## 2019-01-23 DIAGNOSIS — R002 Palpitations: Secondary | ICD-10-CM

## 2019-01-30 ENCOUNTER — Other Ambulatory Visit: Payer: Self-pay

## 2019-01-30 ENCOUNTER — Ambulatory Visit (INDEPENDENT_AMBULATORY_CARE_PROVIDER_SITE_OTHER): Payer: Medicare Other | Admitting: *Deleted

## 2019-01-30 DIAGNOSIS — Z5181 Encounter for therapeutic drug level monitoring: Secondary | ICD-10-CM | POA: Diagnosis not present

## 2019-01-30 DIAGNOSIS — Z23 Encounter for immunization: Secondary | ICD-10-CM

## 2019-01-30 DIAGNOSIS — I4819 Other persistent atrial fibrillation: Secondary | ICD-10-CM

## 2019-01-30 LAB — POCT INR: INR: 2.1 (ref 2.0–3.0)

## 2019-01-30 NOTE — Patient Instructions (Signed)
Description   Continue taking 5mg  (1 tablet) daily. Recheck INR in 4 weeks. Continue your leafy green vegetable intake consistent and or your Boost. Call us with any medication changes or questions to Coumadin Clinic [phone].

## 2019-02-04 DIAGNOSIS — R002 Palpitations: Secondary | ICD-10-CM | POA: Diagnosis not present

## 2019-02-13 ENCOUNTER — Other Ambulatory Visit: Payer: Self-pay | Admitting: Cardiology

## 2019-02-27 ENCOUNTER — Ambulatory Visit (INDEPENDENT_AMBULATORY_CARE_PROVIDER_SITE_OTHER): Payer: Medicare Other | Admitting: Pharmacist

## 2019-02-27 ENCOUNTER — Other Ambulatory Visit: Payer: Self-pay

## 2019-02-27 DIAGNOSIS — I4819 Other persistent atrial fibrillation: Secondary | ICD-10-CM | POA: Diagnosis not present

## 2019-02-27 DIAGNOSIS — Z5181 Encounter for therapeutic drug level monitoring: Secondary | ICD-10-CM | POA: Diagnosis not present

## 2019-02-27 LAB — POCT INR: INR: 1.9 — AB (ref 2.0–3.0)

## 2019-02-27 NOTE — Patient Instructions (Signed)
Description   Take 7.5 mg (1.5 tablets) today and then continue taking 5mg  (1 tablet) daily. Recheck INR in 4 weeks. Continue your leafy green vegetable intake consistent and or your Boost. Call us with any medication changes or questions to Coumadin Clinic [phone].

## 2019-03-13 ENCOUNTER — Other Ambulatory Visit: Payer: Self-pay

## 2019-03-13 ENCOUNTER — Ambulatory Visit (HOSPITAL_COMMUNITY): Payer: Medicare Other | Attending: Cardiology

## 2019-03-13 ENCOUNTER — Other Ambulatory Visit: Payer: Self-pay | Admitting: Cardiology

## 2019-03-13 DIAGNOSIS — I77810 Thoracic aortic ectasia: Secondary | ICD-10-CM | POA: Diagnosis not present

## 2019-03-13 MED ORDER — PERFLUTREN LIPID MICROSPHERE
1.0000 mL | INTRAVENOUS | Status: AC | PRN
  Administered 2019-03-13: 2 mL via INTRAVENOUS

## 2019-03-27 ENCOUNTER — Ambulatory Visit (INDEPENDENT_AMBULATORY_CARE_PROVIDER_SITE_OTHER): Payer: Medicare Other | Admitting: *Deleted

## 2019-03-27 ENCOUNTER — Other Ambulatory Visit: Payer: Self-pay

## 2019-03-27 DIAGNOSIS — I4819 Other persistent atrial fibrillation: Secondary | ICD-10-CM

## 2019-03-27 DIAGNOSIS — Z5181 Encounter for therapeutic drug level monitoring: Secondary | ICD-10-CM

## 2019-03-27 LAB — POCT INR: INR: 2.9 (ref 2.0–3.0)

## 2019-03-27 NOTE — Patient Instructions (Signed)
Description    Continue taking 5mg  (1 tablet) daily. Recheck INR in 5 weeks. Continue your leafy green vegetable intake consistent and or your Boost. Call us with any medication changes or questions to Coumadin Clinic [phone].

## 2019-05-01 ENCOUNTER — Other Ambulatory Visit: Payer: Self-pay

## 2019-05-01 ENCOUNTER — Ambulatory Visit (INDEPENDENT_AMBULATORY_CARE_PROVIDER_SITE_OTHER): Payer: Medicare Other

## 2019-05-01 DIAGNOSIS — I4819 Other persistent atrial fibrillation: Secondary | ICD-10-CM

## 2019-05-01 DIAGNOSIS — Z5181 Encounter for therapeutic drug level monitoring: Secondary | ICD-10-CM | POA: Diagnosis not present

## 2019-05-01 LAB — POCT INR: INR: 3.2 — AB (ref 2.0–3.0)

## 2019-05-01 NOTE — Patient Instructions (Signed)
Description   Skip today's dosage of Coumadin, then resume same dosage 5mg  (1 tablet) daily. Recheck INR in 5 weeks. Continue your leafy green vegetable intake consistent and or your Boost. Call with any medication changes or questions to Coumadin Clinic [phone].

## 2019-05-30 DIAGNOSIS — M1712 Unilateral primary osteoarthritis, left knee: Secondary | ICD-10-CM | POA: Diagnosis not present

## 2019-05-30 DIAGNOSIS — M1711 Unilateral primary osteoarthritis, right knee: Secondary | ICD-10-CM | POA: Diagnosis not present

## 2019-05-30 DIAGNOSIS — M170 Bilateral primary osteoarthritis of knee: Secondary | ICD-10-CM | POA: Diagnosis not present

## 2019-06-05 ENCOUNTER — Ambulatory Visit (INDEPENDENT_AMBULATORY_CARE_PROVIDER_SITE_OTHER): Payer: Medicare Other | Admitting: *Deleted

## 2019-06-05 ENCOUNTER — Other Ambulatory Visit: Payer: Self-pay

## 2019-06-05 DIAGNOSIS — I4819 Other persistent atrial fibrillation: Secondary | ICD-10-CM | POA: Diagnosis not present

## 2019-06-05 DIAGNOSIS — Z5181 Encounter for therapeutic drug level monitoring: Secondary | ICD-10-CM | POA: Diagnosis not present

## 2019-06-05 LAB — POCT INR: INR: 2.9 (ref 2.0–3.0)

## 2019-06-05 NOTE — Patient Instructions (Signed)
Description   Continue same dosage 5mg  (1 tablet) daily. Recheck INR in 5 weeks. Continue your leafy green vegetable intake consistent and or your Boost. Call with any medication changes or questions to Coumadin Clinic [phone].

## 2019-06-06 DIAGNOSIS — F418 Other specified anxiety disorders: Secondary | ICD-10-CM | POA: Diagnosis not present

## 2019-06-06 DIAGNOSIS — E1169 Type 2 diabetes mellitus with other specified complication: Secondary | ICD-10-CM | POA: Diagnosis not present

## 2019-06-06 DIAGNOSIS — M109 Gout, unspecified: Secondary | ICD-10-CM | POA: Diagnosis not present

## 2019-06-06 DIAGNOSIS — I482 Chronic atrial fibrillation, unspecified: Secondary | ICD-10-CM | POA: Diagnosis not present

## 2019-06-06 DIAGNOSIS — D6859 Other primary thrombophilia: Secondary | ICD-10-CM | POA: Diagnosis not present

## 2019-06-06 DIAGNOSIS — N1832 Chronic kidney disease, stage 3b: Secondary | ICD-10-CM | POA: Diagnosis not present

## 2019-06-06 DIAGNOSIS — G894 Chronic pain syndrome: Secondary | ICD-10-CM | POA: Diagnosis not present

## 2019-06-06 DIAGNOSIS — Z0000 Encounter for general adult medical examination without abnormal findings: Secondary | ICD-10-CM | POA: Diagnosis not present

## 2019-06-06 DIAGNOSIS — I10 Essential (primary) hypertension: Secondary | ICD-10-CM | POA: Diagnosis not present

## 2019-06-06 DIAGNOSIS — R269 Unspecified abnormalities of gait and mobility: Secondary | ICD-10-CM | POA: Diagnosis not present

## 2019-06-06 DIAGNOSIS — E785 Hyperlipidemia, unspecified: Secondary | ICD-10-CM | POA: Diagnosis not present

## 2019-06-27 DIAGNOSIS — I4891 Unspecified atrial fibrillation: Secondary | ICD-10-CM | POA: Diagnosis not present

## 2019-06-27 DIAGNOSIS — N2581 Secondary hyperparathyroidism of renal origin: Secondary | ICD-10-CM | POA: Diagnosis not present

## 2019-06-27 DIAGNOSIS — I77819 Aortic ectasia, unspecified site: Secondary | ICD-10-CM | POA: Diagnosis not present

## 2019-06-27 DIAGNOSIS — N400 Enlarged prostate without lower urinary tract symptoms: Secondary | ICD-10-CM | POA: Diagnosis not present

## 2019-06-27 DIAGNOSIS — D631 Anemia in chronic kidney disease: Secondary | ICD-10-CM | POA: Diagnosis not present

## 2019-06-27 DIAGNOSIS — N183 Chronic kidney disease, stage 3 unspecified: Secondary | ICD-10-CM | POA: Diagnosis not present

## 2019-06-27 DIAGNOSIS — N189 Chronic kidney disease, unspecified: Secondary | ICD-10-CM | POA: Diagnosis not present

## 2019-06-27 DIAGNOSIS — I129 Hypertensive chronic kidney disease with stage 1 through stage 4 chronic kidney disease, or unspecified chronic kidney disease: Secondary | ICD-10-CM | POA: Diagnosis not present

## 2019-06-27 DIAGNOSIS — R972 Elevated prostate specific antigen [PSA]: Secondary | ICD-10-CM | POA: Diagnosis not present

## 2019-06-28 DIAGNOSIS — M1711 Unilateral primary osteoarthritis, right knee: Secondary | ICD-10-CM | POA: Diagnosis not present

## 2019-06-28 DIAGNOSIS — M1712 Unilateral primary osteoarthritis, left knee: Secondary | ICD-10-CM | POA: Diagnosis not present

## 2019-06-28 DIAGNOSIS — M170 Bilateral primary osteoarthritis of knee: Secondary | ICD-10-CM | POA: Diagnosis not present

## 2019-07-05 DIAGNOSIS — M170 Bilateral primary osteoarthritis of knee: Secondary | ICD-10-CM | POA: Diagnosis not present

## 2019-07-05 DIAGNOSIS — M1711 Unilateral primary osteoarthritis, right knee: Secondary | ICD-10-CM | POA: Diagnosis not present

## 2019-07-05 DIAGNOSIS — M1712 Unilateral primary osteoarthritis, left knee: Secondary | ICD-10-CM | POA: Diagnosis not present

## 2019-07-11 ENCOUNTER — Other Ambulatory Visit: Payer: Self-pay

## 2019-07-11 ENCOUNTER — Ambulatory Visit (INDEPENDENT_AMBULATORY_CARE_PROVIDER_SITE_OTHER): Payer: Medicare Other | Admitting: Cardiology

## 2019-07-11 ENCOUNTER — Encounter: Payer: Self-pay | Admitting: Cardiology

## 2019-07-11 ENCOUNTER — Ambulatory Visit (INDEPENDENT_AMBULATORY_CARE_PROVIDER_SITE_OTHER): Payer: Medicare Other | Admitting: *Deleted

## 2019-07-11 VITALS — BP 142/102 | HR 70 | Ht 72.0 in | Wt 200.6 lb

## 2019-07-11 DIAGNOSIS — Z5181 Encounter for therapeutic drug level monitoring: Secondary | ICD-10-CM

## 2019-07-11 DIAGNOSIS — I5022 Chronic systolic (congestive) heart failure: Secondary | ICD-10-CM | POA: Diagnosis not present

## 2019-07-11 DIAGNOSIS — I4819 Other persistent atrial fibrillation: Secondary | ICD-10-CM

## 2019-07-11 DIAGNOSIS — I77810 Thoracic aortic ectasia: Secondary | ICD-10-CM

## 2019-07-11 DIAGNOSIS — I739 Peripheral vascular disease, unspecified: Secondary | ICD-10-CM | POA: Diagnosis not present

## 2019-07-11 LAB — POCT INR: INR: 3.5 — AB (ref 2.0–3.0)

## 2019-07-11 MED ORDER — CLONIDINE HCL 0.2 MG PO TABS: 0.2000 mg | ORAL_TABLET | Freq: Two times a day (BID) | ORAL | 1 refills | Status: DC

## 2019-07-11 NOTE — Patient Instructions (Signed)
Medication Instructions:   HOLD ENTRESTO FOR ONE WEEK ONLY THEN HAVE A FOLLOW-UP WITH DR. Anne Fu IN ONE WEEK ON 07/17/19 TO SEE IF YOUR SYMPTOMS IMPROVE WITH THIS MEDICATION BEING HELD.  INCREASE YOUR CLONIDINE TO 0.2 MG BY MOUTH TWICE DAILY  *If you need a refill on your cardiac medications before your next appointment, please call your pharmacy*   Testing/Procedures:  Your physician has requested that you have a lower extremity arterial duplex. This test is an ultrasound of the arteries in the legs or arms. It looks at arterial blood flow in the legs and arms. Allow one hour for Lower and Upper Arterial scans. There are no restrictions or special instructions    Follow-Up:  WITH DR. Anne Fu ON July 17, 2019 AT HIS 10 AM END SLOT IN PERSON--THIS IS TO FOLLOW-UP ON MEDICATION CHANGES AND WITH HOLDING YOUR ENTRESTO FOR ONE WEEK. SCHEDULING OK TO ADD TO DR. Anne Fu 3/22 SCHEDULE AT 10 AM SLOT, PUT OK PER DR. Anne Fu AND Asim Gersten.

## 2019-07-11 NOTE — Patient Instructions (Signed)
Description   Hold today and then start taking 1 tablet daily except for 1/2 a tablet on Fridays. Recheck INR in 3 weeks. Continue your leafy green vegetable intake consistent and or your Boost. Call us with any medication changes or questions to Coumadin Clinic [phone].

## 2019-07-11 NOTE — Progress Notes (Signed)
Cardiology Office Note:    Date:  07/11/2019   ID:  Ralph Sullivan, DOB 1945-12-06, MRN [id number]  PCP:  Lorenda Ishihara, MD  Cardiologist:  Donato Schultz, MD  Electrophysiologist:  None   Referring MD: Lorenda Ishihara,*     History of Present Illness:    Ralph Sullivan is a 74 y.o. male here for follow-up of dilated cardiomyopathy with EF of 25% discovered back in June 2019.  On Entresto.  Tolerating.  Pulled back on clonidine.  Chronic disequilibrium.  Uses a walker.  Mild dizziness.  Aortic root was 4.5 cm back in 2014.  Has been noticing that chest feels heavy when goes to bed. Cold chills. No fever. Clonidine 0.2 to 0.1 has been high at home.   Feels like legs have been hurting him ever since starting the Entresto 3 years ago.  He is on low-dose.  His blood pressure has also been high since decreasing the clonidine.  He is on low-dose Entresto.  See below for details.  Occasional chest discomfort.  Lab work 06/06/2019 shows LDL of 55 triglycerides 187 hemoglobin A1c of 5.8 hemoglobin of 14.8 creatinine of 1.5 potassium 4.7  Echo 2020-aortic root at maximum 43 mm.  EF has improved to 50%  Past Medical History:  Diagnosis Date  . Atrial fibrillation (HCC) 02/08/2013   CHADS-VAS - 2 (AGE, HTN). On anticoagulation. Holter 6/14 - 2.3 sec pause. Avg HR 87. Rate control   . Bifascicular block 03/28/2013  . Bilateral congenital hammer toes   . Chronic anticoagulation 03/28/2013  . Chronic kidney disease, stage III (moderate) 03/28/2013  . 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 Gout   . Hyperhidrosis 03/28/2013  . Obesity, unspecified 03/28/2013  . Rhabdomyolysis 09/2015  . 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 CATARACT EXTRACTION    . HERNIA REPAIR    . INGUINAL HERNIA REPAIR  07/16/2016  . INGUINAL HERNIA REPAIR Left 07/16/2016    Procedure: LAPAROSCOPIC INGUINAL HERNIA REPAIR;  Surgeon: De Blanch Kinsinger, MD;  Location: [facility] OR;  Service: General;  Laterality: Left;  . INSERTION OF MESH Left 07/16/2016   Procedure: INSERTION OF MESH;  Surgeon: Rodman Pickle, MD;  Location: [facility] OR;  Service: General;  Laterality: Left;  . KIDNEY STONE SURGERY      Current Medications: Current Meds  Medication Sig  . allopurinol (ZYLOPRIM) 300 MG tablet Take 300 mg by mouth daily.   Marland Kitchen buPROPion (WELLBUTRIN SR) 150 MG 12 hr tablet Take 150 mg by mouth daily.  . CHROMIUM-CINNAMON PO Take 200 mg by mouth at bedtime.   Marland Kitchen ENTRESTO 24-26 MG Take 1 tablet by mouth twice daily  . escitalopram (LEXAPRO) 10 MG tablet Take 10 mg by mouth daily.  . ferrous sulfate 325 (65 FE) MG tablet Take 325 mg by mouth daily with breakfast.  . fluticasone (FLONASE) 50 MCG/ACT nasal spray Place 2 sprays into both nostrils daily.   . metoprolol (LOPRESSOR) 100 MG tablet Take 200 mg by mouth 2 (two) times daily.   . Omega-3 Fatty Acids (OMEGA 3 PO) Take 1 capsule by mouth 2 (two) times daily.   . vitamin E 400 UNIT capsule Take 400 Units by mouth daily.  Marland Kitchen warfarin (COUMADIN) 5 MG tablet USE AS DIRECTED BY  COUMADIN  CLINIC  . [DISCONTINUED] cloNIDine (CATAPRES) 0.1 MG tablet Take 1  tablet by mouth twice daily     Allergies:   Duloxetine, Oxycontin [oxycodone hcl], and Sulfur   Social History   Socioeconomic History  . Marital status: Married    Spouse name: Not on file  . Number of children: Not on file  . Years of education: Not on file  . Highest education level: Not on file  Occupational History  . Not on file  Tobacco Use  . Smoking status: Former Smoker    Quit date: 03/1970    Years since quitting: 49.3  . Smokeless tobacco: Never Used  Substance and Sexual Activity  . Alcohol use: No    Alcohol/week: 0.0 standard drinks  . Drug use: No  . Sexual activity: Not on file  Other Topics Concern  . Not on file  Social History  Narrative  . Not on file   Social Determinants of Health   Financial Resource Strain:   . Difficulty of Paying Living Expenses:   Food Insecurity:   . Worried About Charity fundraiser in the Last Year:   . Arboriculturist in the Last Year:   Transportation Needs:   . Film/video editor (Medical):   Marland Kitchen Lack of Transportation (Non-Medical):   Physical Activity:   . Days of Exercise per Week:   . Minutes of Exercise per Session:   Stress:   . Feeling of Stress :   Social Connections:   . Frequency of Communication with Friends and Family:   . Frequency of Social Gatherings with Friends and Family:   . Attends Religious Services:   . Active Member of Clubs or Organizations:   . Attends Archivist Meetings:   Marland Kitchen Marital Status:      Family History: The patient's family history includes Diabetes in his mother; Hypertension in his mother.  ROS:   Please see the history of present illness.     All other systems reviewed and are negative.  EKGs/Labs/Other Studies Reviewed:    The following studies were reviewed today:  Echo June 2019 EF 25%  EKG:  EKG is ordered today.  The ekg ordered today demonstrates atrial fibrillation 73 bpm prior ECG 2019 showed A. fib with bifascicular block heart rate of 76.  Recent Labs: No results found for requested labs within last 8760 hours.  Recent Lipid Panel No results found for: CHOL, TRIG, HDL, CHOLHDL, VLDL, LDLCALC, LDLDIRECT  Physical Exam:    VS:  BP (!) 142/102   Pulse 70   Ht 6' (1.829 m)   Wt 200 lb 9.6 oz (91 kg)   SpO2 99%   BMI 27.21 kg/m     Wt Readings from Last 3 Encounters:  07/11/19 200 lb 9.6 oz (91 kg)  01/09/19 205 lb 6.4 oz (93.2 kg)  08/30/18 187 lb (84.8 kg)     GEN: Utilizes rolling walker well nourished, well developed in no acute distress HEENT: Normal NECK: No JVD; No carotid bruits LYMPHATICS: No lymphadenopathy CARDIAC: Irregularly irregular normal rate, no murmurs, rubs,  gallops RESPIRATORY:  Clear to auscultation without rales, wheezing or rhonchi  ABDOMEN: Soft, non-tender, non-distended MUSCULOSKELETAL: 1+ bilateral lower extremity ankle edema; No deformity  SKIN: Warm and dry NEUROLOGIC:  Alert and oriented x 3 PSYCHIATRIC:  Normal affect   ASSESSMENT:    1. Claudication of both lower extremities (HCC)   2. Persistent atrial fibrillation ([place])   3. Dilated aortic root ([place])   4. Chronic systolic heart failure ([place])  PLAN:    In order of problems listed above:  Dilated cardiomyopathy, chronic systolic heart failure -EF 25% now improved to 50.  He is concerned that the [facility] that he started a couple years ago is causing his leg pain.  I stated that this would be unusual.  His potassium is excellent.  He would really like to take an [place] holiday.  We will do this.  Within 7 days we will know whether or not this is a cause of leg discomfort.  In the meantime, we will go ahead and check lower extremity ultrasound/ABI.  He does have some claudication at times.  Permanent atrial fibrillation -He is on high-dose metoprolol 200 mg tartrate twice a day.  Has right bundle branch block left anterior fascicular block.  No syncope.  This higher dose of metoprolol has been tolerated and is helping with adequate rate control for him.  His EKG heart rate was 70 today.  I am okay with him continuing with this higher dose.  Obviously if his heart rate were to reduce significantly, we will need to reduce it.  Chronic anticoagulation -On Coumadin.  Stable  Essential hypertension -Diastolic has been high at home in the 90s to 100. -he would like to go back up on his clonidine to 0.2. -Optimally, I think he would be better served with increasing the [facility] however he thinks his leg pain is coming from the [place].  We will see him back soon, next Monday to see how he does off of the Entresto.  Chronic kidney disease stage III -Followed by Dr. Hyman Hopes.  Last  creatinine 1.55, potassium 4.7.  Right bundle branch block left anterior fascicular block bifascicular block -He is on high-dose of metoprolol for rate control.  At this point, no signs of worsening AV conduction.  Obviously if this does occur, metoprolol will need to be reduced.   Medication Adjustments/Labs and Tests Ordered: Current medicines are reviewed at length with the patient today.  Concerns regarding medicines are outlined above.  Orders Placed This Encounter  Procedures  . EKG 12-Lead  . VAS [place] LOWER EXTREMITY ARTERIAL DUPLEX   Meds ordered this encounter  Medications  . cloNIDine (CATAPRES) 0.2 MG tablet    Sig: Take 1 tablet (0.2 mg total) by mouth 2 (two) times daily.    Dispense:  60 tablet    Refill:  1    Patient Instructions  Medication Instructions:   HOLD ENTRESTO FOR ONE WEEK ONLY THEN HAVE A FOLLOW-UP WITH DR. Anne Fu IN ONE WEEK ON 07/17/19 TO SEE IF YOUR SYMPTOMS IMPROVE WITH THIS MEDICATION BEING HELD.  INCREASE YOUR CLONIDINE TO 0.2 MG BY MOUTH TWICE DAILY  *If you need a refill on your cardiac medications before your next appointment, please call your pharmacy*   Testing/Procedures:  Your physician has requested that you have a lower extremity arterial duplex. This test is an ultrasound of the arteries in the legs or arms. It looks at arterial blood flow in the legs and arms. Allow one hour for Lower and Upper Arterial scans. There are no restrictions or special instructions    Follow-Up:  WITH DR. Anne Fu ON July 17, 2019 AT HIS 10 AM END SLOT IN PERSON--THIS IS TO FOLLOW-UP ON MEDICATION CHANGES AND WITH HOLDING YOUR ENTRESTO FOR ONE WEEK. SCHEDULING OK TO ADD TO DR. Anne Fu 3/22 SCHEDULE AT 10 AM SLOT, PUT OK PER DR. Anne Fu AND IVY.      Signed, Donato Schultz, MD  07/11/2019 10:09 AM  [facility]

## 2019-07-12 DIAGNOSIS — M1712 Unilateral primary osteoarthritis, left knee: Secondary | ICD-10-CM | POA: Diagnosis not present

## 2019-07-12 DIAGNOSIS — M1711 Unilateral primary osteoarthritis, right knee: Secondary | ICD-10-CM | POA: Diagnosis not present

## 2019-07-12 DIAGNOSIS — M170 Bilateral primary osteoarthritis of knee: Secondary | ICD-10-CM | POA: Diagnosis not present

## 2019-07-17 ENCOUNTER — Encounter: Payer: Self-pay | Admitting: Cardiology

## 2019-07-17 ENCOUNTER — Ambulatory Visit (INDEPENDENT_AMBULATORY_CARE_PROVIDER_SITE_OTHER): Payer: Medicare Other | Admitting: Cardiology

## 2019-07-17 ENCOUNTER — Other Ambulatory Visit: Payer: Self-pay

## 2019-07-17 VITALS — BP 120/70 | HR 85 | Ht 72.0 in | Wt 202.8 lb

## 2019-07-17 DIAGNOSIS — I5022 Chronic systolic (congestive) heart failure: Secondary | ICD-10-CM | POA: Diagnosis not present

## 2019-07-17 DIAGNOSIS — I4819 Other persistent atrial fibrillation: Secondary | ICD-10-CM

## 2019-07-17 NOTE — Patient Instructions (Signed)
Medication Instructions:  You may remain off of Entresto. Continue all other medications as listed.  *If you need a refill on your cardiac medications before your next appointment, please call your pharmacy*  Follow-Up: At [facility], you and your health needs are our priority.  As part of our continuing mission to provide you with exceptional heart care, we have created designated Provider Care Teams.  These Care Teams include your primary Cardiologist (physician) and Advanced Practice Providers (APPs -  Physician Assistants and Nurse Practitioners) who all work together to provide you with the care you need, when you need it.  We recommend signing up for the patient portal called "MyChart".  Sign up information is provided on this After Visit Summary.  MyChart is used to connect with patients for Virtual Visits (Telemedicine).  Patients are able to view lab/test results, encounter notes, upcoming appointments, etc.  Non-urgent messages can be sent to your provider as well.   To learn more about what you can do with MyChart, go to [email or web address].    Your next appointment:   3 month(s)  The format for your next appointment:   In Person  Provider:   Donato Schultz, MD  Thank you for choosing [facility]!!

## 2019-07-17 NOTE — Progress Notes (Signed)
Cardiology Office Note:    Date:  07/17/2019   ID:  Ralph Sullivan, DOB 02/25/1946, MRN [id number]  PCP:  Lorenda Ishihara, MD  Cardiologist:  Donato Schultz, MD  Electrophysiologist:  None   Referring MD: Lorenda Ishihara,*     History of Present Illness:    Ralph Sullivan is a 74 y.o. male here for follow-up of dilated cardiomyopathy with EF of 25% discovered back in June 2019.  On Entresto.  Tolerating.  Pulled back on clonidine.  Chronic disequilibrium.  Uses a walker.  Mild dizziness.  Aortic root was 4.5 cm back in 2014.  Has been noticing that chest feels heavy when goes to bed. Cold chills. No fever. Clonidine 0.2 to 0.1 has been high at home.   Feels like legs have been hurting him ever since starting the Entresto 3 years ago.  He is on low-dose.  His blood pressure has also been high since decreasing the clonidine.  He is on low-dose Entresto.  See below for details.  Occasional chest discomfort.  Lab work 06/06/2019 shows LDL of 55 triglycerides 187 hemoglobin A1c of 5.8 hemoglobin of 14.8 creatinine of 1.5 potassium 4.7  Echo 2020-aortic root at maximum 43 mm.  EF has improved to 50%  07/17/2019-here for follow-up after stopping Entresto for dilated cardiomyopathy with improved EF.  He was having leg pain.  We decided given the holiday.  He does state that he feels better off of the Entresto.  His blood pressure is also improved.  A little bit higher.  No fevers chills nausea vomiting.  See below for details.  Past Medical History:  Diagnosis Date  . Atrial fibrillation (HCC) 02/08/2013   CHADS-VAS - 2 (AGE, HTN). On anticoagulation. Holter 6/14 - 2.3 sec pause. Avg HR 87. Rate control   . Bifascicular block 03/28/2013  . Bilateral congenital hammer toes   . Chronic anticoagulation 03/28/2013  . Chronic kidney disease, stage III (moderate) 03/28/2013  . 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 Gout   .  Hyperhidrosis 03/28/2013  . Obesity, unspecified 03/28/2013  . Rhabdomyolysis 09/2015  . 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 CATARACT EXTRACTION    . HERNIA REPAIR    . INGUINAL HERNIA REPAIR  07/16/2016  . INGUINAL HERNIA REPAIR Left 07/16/2016   Procedure: LAPAROSCOPIC INGUINAL HERNIA REPAIR;  Surgeon: De Blanch Kinsinger, MD;  Location: [facility] OR;  Service: General;  Laterality: Left;  . INSERTION OF MESH Left 07/16/2016   Procedure: INSERTION OF MESH;  Surgeon: Rodman Pickle, MD;  Location: [facility] OR;  Service: General;  Laterality: Left;  . KIDNEY STONE SURGERY      Current Medications: Current Meds  Medication Sig  . allopurinol (ZYLOPRIM) 300 MG tablet Take 300 mg by mouth daily.   . CHROMIUM-CINNAMON PO Take 200 mg by mouth at bedtime.   . cloNIDine (CATAPRES) 0.2 MG tablet Take 1 tablet (0.2 mg total) by mouth 2 (two) times daily.  Marland Kitchen escitalopram (LEXAPRO) 10 MG tablet Take 10 mg by mouth daily.  . ferrous sulfate 325 (65 FE) MG tablet Take 325 mg by mouth daily with breakfast.  . fluticasone (FLONASE) 50 MCG/ACT nasal spray Place 2 sprays into both nostrils daily.   . metoprolol (LOPRESSOR) 100 MG tablet Take 200 mg by mouth 2 (two) times daily.   . Omega-3 Fatty Acids (  OMEGA 3 PO) Take 1 capsule by mouth 2 (two) times daily.   . vitamin E 400 UNIT capsule Take 400 Units by mouth daily.  Marland Kitchen warfarin (COUMADIN) 5 MG tablet USE AS DIRECTED BY  COUMADIN  CLINIC  . [DISCONTINUED] ENTRESTO 24-26 MG Take 1 tablet by mouth twice daily     Allergies:   Duloxetine, Oxycontin [oxycodone hcl], and Sulfur   Social History   Socioeconomic History  . Marital status: Married    Spouse name: Not on file  . Number of children: Not on file  . Years of education: Not on file  . Highest education level: Not on file  Occupational History  . Not on file  Tobacco Use  . Smoking status: Former Smoker    Quit date:  03/1970    Years since quitting: 49.3  . Smokeless tobacco: Never Used  Substance and Sexual Activity  . Alcohol use: No    Alcohol/week: 0.0 standard drinks  . Drug use: No  . Sexual activity: Not on file  Other Topics Concern  . Not on file  Social History Narrative  . Not on file   Social Determinants of Health   Financial Resource Strain:   . Difficulty of Paying Living Expenses:   Food Insecurity:   . Worried About Programme researcher, broadcasting/film/video in the Last Year:   . Barista in the Last Year:   Transportation Needs:   . Freight forwarder (Medical):   Marland Kitchen Lack of Transportation (Non-Medical):   Physical Activity:   . Days of Exercise per Week:   . Minutes of Exercise per Session:   Stress:   . Feeling of Stress :   Social Connections:   . Frequency of Communication with Friends and Family:   . Frequency of Social Gatherings with Friends and Family:   . Attends Religious Services:   . Active Member of Clubs or Organizations:   . Attends Banker Meetings:   Marland Kitchen Marital Status:      Family History: The patient's family history includes Diabetes in his mother; Hypertension in his mother.  ROS:   Please see the history of present illness.     All other systems reviewed and are negative.  EKGs/Labs/Other Studies Reviewed:    The following studies were reviewed today:  Echo June 2019 EF 25%  EKG:  EKG is ordered today.  The ekg ordered today demonstrates atrial fibrillation 73 bpm prior ECG 2019 showed A. fib with bifascicular block heart rate of 76.  Recent Labs: No results found for requested labs within last 8760 hours.  Recent Lipid Panel No results found for: CHOL, TRIG, HDL, CHOLHDL, VLDL, LDLCALC, LDLDIRECT  Physical Exam:    VS:  BP 120/70   Pulse 85   Ht 6' (1.829 m)   Wt 202 lb 12.8 oz (92 kg)   SpO2 98%   BMI 27.50 kg/m     Wt Readings from Last 3 Encounters:  07/17/19 202 lb 12.8 oz (92 kg)  07/11/19 200 lb 9.6 oz (91 kg)    01/09/19 205 lb 6.4 oz (93.2 kg)     GEN: Utilizes rolling walker well nourished, well developed in no acute distress HEENT: Normal NECK: No JVD; No carotid bruits LYMPHATICS: No lymphadenopathy CARDIAC: Irregularly irregular normal rate, no murmurs, rubs, gallops RESPIRATORY:  Clear to auscultation without rales, wheezing or rhonchi  ABDOMEN: Soft, non-tender, non-distended MUSCULOSKELETAL: 1+ bilateral lower extremity ankle edema; No deformity  SKIN: Warm  and dry NEUROLOGIC:  Alert and oriented x 3 PSYCHIATRIC:  Normal affect   ASSESSMENT:    1. Chronic systolic heart failure (HCC)   2. Persistent atrial fibrillation (HCC)    PLAN:    In order of problems listed above:  Dilated cardiomyopathy, chronic systolic heart failure -EF 25% now improved to 60.  He was concerned that the [facility] that he started a couple years ago is causing his leg pain.  I stated that this would be unusual at last week's visit.  We granted him an Entresto holiday.  He is now back after 7-day holiday.  He states that his legs are feeling better.  I am fine with him discontinuing/staying off of the Entresto low-dose.  His blood pressure also is a little bit better.  He is taking the clonidine at 0.2 mg twice a day now.  He feels better overall.  He should be set up as well to check lower extremity ultrasound/ABI.  He does have some claudication at times. -Continue with goal dose metoprolol -May need to restart angiotensin receptor blocker in the future especially if blood pressure increases.  Permanent atrial fibrillation -He is on high-dose metoprolol 200 mg tartrate twice a day.  Has right bundle branch block left anterior fascicular block.  No syncope.  This higher dose of metoprolol has been tolerated and is helping with adequate rate control for him.  His EKG heart rate was 70 today.  I am okay with him continuing with this higher dose.  Obviously if his heart rate were to reduce significantly, we will  need to reduce it.  Chronic anticoagulation -On Coumadin.  Stable.  Last INR was 3.5.  Essential hypertension -Doing better off of the Entresto.  Continuing with clonidine 0.2 twice a day.  Chronic kidney disease stage III -Followed by Dr. Hyman Hopes.  Last creatinine 1.55, potassium 4.7.  Right bundle branch block left anterior fascicular block bifascicular block -He is on high-dose of metoprolol for rate control.  At this point, no signs of worsening AV conduction.  Obviously if this does occur, metoprolol will need to be reduced.   Medication Adjustments/Labs and Tests Ordered: Current medicines are reviewed at length with the patient today.  Concerns regarding medicines are outlined above.  No orders of the defined types were placed in this encounter.  No orders of the defined types were placed in this encounter.   Patient Instructions  Medication Instructions:  You may remain off of Entresto. Continue all other medications as listed.  *If you need a refill on your cardiac medications before your next appointment, please call your pharmacy*  Follow-Up: At [facility], you and your health needs are our priority.  As part of our continuing mission to provide you with exceptional heart care, we have created designated Provider Care Teams.  These Care Teams include your primary Cardiologist (physician) and Advanced Practice Providers (APPs -  Physician Assistants and Nurse Practitioners) who all work together to provide you with the care you need, when you need it.  We recommend signing up for the patient portal called "MyChart".  Sign up information is provided on this After Visit Summary.  MyChart is used to connect with patients for Virtual Visits (Telemedicine).  Patients are able to view lab/test results, encounter notes, upcoming appointments, etc.  Non-urgent messages can be sent to your provider as well.   To learn more about what you can do with MyChart, go to  [email or web address].    Your next appointment:  3 month(s)  The format for your next appointment:   In Person  Provider:   Candee Furbish, MD  Thank you for choosing [facility]!!         Signed, Candee Furbish, MD  07/17/2019 10:23 AM    [place]

## 2019-07-20 ENCOUNTER — Other Ambulatory Visit: Payer: Self-pay | Admitting: Cardiology

## 2019-07-20 ENCOUNTER — Ambulatory Visit: Payer: Medicare Other | Attending: Internal Medicine

## 2019-07-20 DIAGNOSIS — Z23 Encounter for immunization: Secondary | ICD-10-CM

## 2019-07-20 DIAGNOSIS — I739 Peripheral vascular disease, unspecified: Secondary | ICD-10-CM

## 2019-07-20 NOTE — Progress Notes (Signed)
   Covid-19 Vaccination Clinic  Name:  Ralph Sullivan    MRN: [id number] DOB: [birth year]  07/20/2019  Mr. Gellner was observed post Covid-19 immunization for 15 minutes without incident. He was provided with Vaccine Information Sheet and instruction to access the V-Safe system.   Mr. Grantham was instructed to call 911 with any severe reactions post vaccine: Marland Kitchen Difficulty breathing  . Swelling of face and throat  . A fast heartbeat  . A bad rash all over body  . Dizziness and weakness   Immunizations Administered    Name Date Dose VIS Date Route   Pfizer COVID-19 Vaccine 07/20/2019 10:06 AM 0.3 mL 04/07/2019 Intramuscular   Manufacturer: [employer], [employer]   Lot: [id number]   NDC: [address]-0

## 2019-07-24 ENCOUNTER — Ambulatory Visit (HOSPITAL_COMMUNITY)
Admission: RE | Admit: 2019-07-24 | Discharge: 2019-07-24 | Disposition: A | Discharge status: Home / Self Care | Payer: Medicare Other | Source: Ambulatory Visit | Attending: Internal Medicine | Admitting: Internal Medicine

## 2019-07-24 ENCOUNTER — Other Ambulatory Visit: Payer: Self-pay

## 2019-07-24 DIAGNOSIS — I739 Peripheral vascular disease, unspecified: Secondary | ICD-10-CM | POA: Diagnosis not present

## 2019-08-01 ENCOUNTER — Other Ambulatory Visit: Payer: Self-pay

## 2019-08-01 ENCOUNTER — Ambulatory Visit (INDEPENDENT_AMBULATORY_CARE_PROVIDER_SITE_OTHER): Payer: Medicare Other | Admitting: *Deleted

## 2019-08-01 DIAGNOSIS — I4819 Other persistent atrial fibrillation: Secondary | ICD-10-CM

## 2019-08-01 DIAGNOSIS — Z5181 Encounter for therapeutic drug level monitoring: Secondary | ICD-10-CM | POA: Diagnosis not present

## 2019-08-01 LAB — POCT INR: INR: 3.4 — AB (ref 2.0–3.0)

## 2019-08-01 NOTE — Patient Instructions (Signed)
Description   Hold today and then start taking 1 tablet daily except for 1/2 tablet on Mondays and Fridays. Recheck INR in 2 weeks prior to another appt. Continue your leafy green vegetable intake consistent and or your Boost. Call us with any medication changes or questions to Coumadin Clinic [phone].

## 2019-08-08 ENCOUNTER — Other Ambulatory Visit: Payer: Self-pay | Admitting: Cardiology

## 2019-08-14 ENCOUNTER — Other Ambulatory Visit: Payer: Self-pay

## 2019-08-14 ENCOUNTER — Ambulatory Visit (INDEPENDENT_AMBULATORY_CARE_PROVIDER_SITE_OTHER): Payer: Medicare Other | Admitting: *Deleted

## 2019-08-14 ENCOUNTER — Ambulatory Visit: Payer: Medicare Other | Attending: Internal Medicine

## 2019-08-14 DIAGNOSIS — I4819 Other persistent atrial fibrillation: Secondary | ICD-10-CM

## 2019-08-14 DIAGNOSIS — Z23 Encounter for immunization: Secondary | ICD-10-CM

## 2019-08-14 DIAGNOSIS — Z5181 Encounter for therapeutic drug level monitoring: Secondary | ICD-10-CM

## 2019-08-14 LAB — POCT INR: INR: 2.3 (ref 2.0–3.0)

## 2019-08-14 NOTE — Progress Notes (Signed)
   Covid-19 Vaccination Clinic  Name:  JOCK MAHON    MRN: [id number] DOB: February 22, 1946  08/14/2019  Mr. Lagos was observed post Covid-19 immunization for 15 minutes without incident. He was provided with Vaccine Information Sheet and instruction to access the V-Safe system.   Mr. Gencarelli was instructed to call 911 with any severe reactions post vaccine: Marland Kitchen Difficulty breathing  . Swelling of face and throat  . A fast heartbeat  . A bad rash all over body  . Dizziness and weakness   Immunizations Administered    Name Date Dose VIS Date Route   Pfizer COVID-19 Vaccine 08/14/2019  9:15 AM 0.3 mL 06/21/2018 Intramuscular   Manufacturer: [employer], [employer]   Lot: [id number]   NDC: [address]-6

## 2019-08-14 NOTE — Patient Instructions (Signed)
Description   Continue taking 1 tablet daily except for 1/2 tablet on Mondays and Fridays. Recheck INR in 3 weeks. Continue your leafy green vegetable intake consistent and or your Boost. Call us with any medication changes or questions to Coumadin Clinic [phone].

## 2019-08-22 DIAGNOSIS — M1711 Unilateral primary osteoarthritis, right knee: Secondary | ICD-10-CM | POA: Diagnosis not present

## 2019-09-04 ENCOUNTER — Ambulatory Visit (INDEPENDENT_AMBULATORY_CARE_PROVIDER_SITE_OTHER): Payer: Medicare Other | Admitting: *Deleted

## 2019-09-04 ENCOUNTER — Other Ambulatory Visit: Payer: Self-pay

## 2019-09-04 DIAGNOSIS — Z5181 Encounter for therapeutic drug level monitoring: Secondary | ICD-10-CM | POA: Diagnosis not present

## 2019-09-04 DIAGNOSIS — I4819 Other persistent atrial fibrillation: Secondary | ICD-10-CM

## 2019-09-04 LAB — POCT INR: INR: 2.3 (ref 2.0–3.0)

## 2019-09-04 NOTE — Patient Instructions (Signed)
Description   Continue taking 1 tablet daily except for 1/2 tablet on Mondays and Fridays. Recheck INR in 4 weeks. Continue your leafy green vegetable intake consistent and or your Boost. Call us with any medication changes or questions to Coumadin Clinic [phone].

## 2019-09-05 ENCOUNTER — Other Ambulatory Visit: Payer: Self-pay | Admitting: Cardiology

## 2019-09-05 DIAGNOSIS — I739 Peripheral vascular disease, unspecified: Secondary | ICD-10-CM

## 2019-10-02 ENCOUNTER — Ambulatory Visit (INDEPENDENT_AMBULATORY_CARE_PROVIDER_SITE_OTHER): Payer: Medicare Other | Admitting: Cardiology

## 2019-10-02 ENCOUNTER — Ambulatory Visit (INDEPENDENT_AMBULATORY_CARE_PROVIDER_SITE_OTHER): Payer: Medicare Other | Admitting: *Deleted

## 2019-10-02 ENCOUNTER — Other Ambulatory Visit: Payer: Self-pay

## 2019-10-02 ENCOUNTER — Encounter: Payer: Self-pay | Admitting: Cardiology

## 2019-10-02 ENCOUNTER — Encounter: Payer: Self-pay | Admitting: *Deleted

## 2019-10-02 VITALS — BP 110/70 | HR 67 | Ht 72.0 in | Wt 196.0 lb

## 2019-10-02 DIAGNOSIS — R0789 Other chest pain: Secondary | ICD-10-CM

## 2019-10-02 DIAGNOSIS — R079 Chest pain, unspecified: Secondary | ICD-10-CM | POA: Diagnosis not present

## 2019-10-02 DIAGNOSIS — I5022 Chronic systolic (congestive) heart failure: Secondary | ICD-10-CM

## 2019-10-02 DIAGNOSIS — I4819 Other persistent atrial fibrillation: Secondary | ICD-10-CM | POA: Diagnosis not present

## 2019-10-02 DIAGNOSIS — Z5181 Encounter for therapeutic drug level monitoring: Secondary | ICD-10-CM

## 2019-10-02 LAB — POCT INR: INR: 2.2 (ref 2.0–3.0)

## 2019-10-02 NOTE — Patient Instructions (Signed)
Medication Instructions:  The current medical regimen is effective;  continue present plan and medications.  *If you need a refill on your cardiac medications before your next appointment, please call your pharmacy*  Testing/Procedures: Your physician has requested that you have a lexiscan myoview. For further information please visit [email or web address] Please follow instruction sheet, as given.  Follow-Up: At [facility], you and your health needs are our priority.  As part of our continuing mission to provide you with exceptional heart care, we have created designated Provider Care Teams.  These Care Teams include your primary Cardiologist (physician) and Advanced Practice Providers (APPs -  Physician Assistants and Nurse Practitioners) who all work together to provide you with the care you need, when you need it.  We recommend signing up for the patient portal called "MyChart".  Sign up information is provided on this After Visit Summary.  MyChart is used to connect with patients for Virtual Visits (Telemedicine).  Patients are able to view lab/test results, encounter notes, upcoming appointments, etc.  Non-urgent messages can be sent to your provider as well.   To learn more about what you can do with MyChart, go to [email or web address].    Your next appointment:   4 month(s)  The format for your next appointment:   In Person  Provider:   Donato Schultz, MD   Thank you for choosing [facility]!!

## 2019-10-02 NOTE — Progress Notes (Signed)
Cardiology Office Note:    Date:  10/02/2019   ID:  Ralph Sullivan, DOB 01-20-1946, MRN [id number]  PCP:  Leeroy Cha, MD  [facility] Cardiologist:  Candee Furbish, MD  [facility] Electrophysiologist:  None   Referring MD: Leeroy Cha,*     History of Present Illness:    Ralph Sullivan is a 74 y.o. male here for the follow-up of chronic systolic heart failure EF 25% in 2019 now improved to 20%.  His chest had been feeling heavy when he went to bed.  Cold chills.  No fevers.  Legs previously hurt him he states ever since starting the [place] a few years ago.  Blood pressure was high since decreasing the clonidine previously.  Aortic root dilated 4.5 cm in 2014 4.3 cm in 2020 echo  We did up stopping the Entresto with his improved EF.  He feels better off of this medication he states.    Past Medical History:  Diagnosis Date  . Atrial fibrillation ([place]) 02/08/2013   CHADS-VAS - 2 (AGE, HTN). On anticoagulation. Holter 6/14 - 2.3 sec pause. Avg HR 87. Rate control   . Bifascicular block 03/28/2013  . Bilateral congenital hammer toes   . Chronic anticoagulation 03/28/2013  . Chronic kidney disease, stage III (moderate) 03/28/2013  . Dilated aortic root ([place]) 03/28/2013   6/14-4.5cm sinus of Valsalva, 4.2 cm sinotubular junction  . Gout   . Hyperhidrosis 03/28/2013  . Obesity, unspecified 03/28/2013  . Rhabdomyolysis 09/2015  . 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 CATARACT EXTRACTION    . HERNIA REPAIR    . INGUINAL HERNIA REPAIR  07/16/2016  . INGUINAL HERNIA REPAIR Left 07/16/2016   Procedure: LAPAROSCOPIC INGUINAL HERNIA REPAIR;  Surgeon: Arta Bruce Kinsinger, MD;  Location: [place];  Service: General;  Laterality: Left;  . INSERTION OF MESH Left 07/16/2016   Procedure: INSERTION OF MESH;  Surgeon: Mickeal Skinner, MD;  Location: [place];  Service: General;  Laterality: Left;  .  KIDNEY STONE SURGERY      Current Medications: Current Meds  Medication Sig  . allopurinol (ZYLOPRIM) 300 MG tablet Take 300 mg by mouth daily.   . CHROMIUM-CINNAMON PO Take 200 mg by mouth at bedtime.   . cloNIDine (CATAPRES) 0.2 MG tablet Take 1 tablet by mouth twice daily  . escitalopram (LEXAPRO) 10 MG tablet Take 10 mg by mouth daily.  . ferrous sulfate 325 (65 FE) MG tablet Take 325 mg by mouth daily with breakfast.  . fluticasone (FLONASE) 50 MCG/ACT nasal spray Place 2 sprays into both nostrils daily.   . metoprolol (LOPRESSOR) 100 MG tablet Take 200 mg by mouth 2 (two) times daily.   . Omega-3 Fatty Acids (OMEGA 3 PO) Take 1 capsule by mouth 2 (two) times daily.   . vitamin E 400 UNIT capsule Take 400 Units by mouth daily.  Marland Kitchen warfarin (COUMADIN) 5 MG tablet TAKE 1/2 TO 1 TABLET DAILY  AS DIRECTED BY  COUMADIN  CLINIC     Allergies:   Duloxetine, Oxycontin [oxycodone hcl], and Sulfur   Social History   Socioeconomic History  . Marital status: Married    Spouse name: Not on file  . Number of children: Not on file  . Years of education: Not on file  . Highest education level: Not on file  Occupational History  . Not on file  Tobacco Use  .  Smoking status: Former Smoker    Quit date: 03/1970    Years since quitting: 49.5  . Smokeless tobacco: Never Used  Substance and Sexual Activity  . Alcohol use: No    Alcohol/week: 0.0 standard drinks  . Drug use: No  . Sexual activity: Not on file  Other Topics Concern  . Not on file  Social History Narrative  . Not on file   Social Determinants of Health   Financial Resource Strain:   . Difficulty of Paying Living Expenses:   Food Insecurity:   . Worried About Programme researcher, broadcasting/film/video in the Last Year:   . Barista in the Last Year:   Transportation Needs:   . Freight forwarder (Medical):   Marland Kitchen Lack of Transportation (Non-Medical):   Physical Activity:   . Days of Exercise per Week:   . Minutes of Exercise per  Session:   Stress:   . Feeling of Stress :   Social Connections:   . Frequency of Communication with Friends and Family:   . Frequency of Social Gatherings with Friends and Family:   . Attends Religious Services:   . Active Member of Clubs or Organizations:   . Attends Banker Meetings:   Marland Kitchen Marital Status:      Family History: The patient's family history includes Diabetes in his mother; Hypertension in his mother.  ROS:   Please see the history of present illness.     All other systems reviewed and are negative.  EKGs/Labs/Other Studies Reviewed:     EKG: A. fib bundle branch block heart rate controlled  Recent Labs: No results found for requested labs within last 8760 hours.  Recent Lipid Panel No results found for: CHOL, TRIG, HDL, CHOLHDL, VLDL, LDLCALC, LDLDIRECT  Physical Exam:    VS:  BP 110/70   Pulse 67   Ht 6' (1.829 m)   Wt 196 lb (88.9 kg)   SpO2 (!) 88%   BMI 26.58 kg/m     Wt Readings from Last 3 Encounters:  10/02/19 196 lb (88.9 kg)  07/17/19 202 lb 12.8 oz (92 kg)  07/11/19 200 lb 9.6 oz (91 kg)     GEN:  Well nourished, well developed in no acute distress HEENT: Normal NECK: No JVD; No carotid bruits LYMPHATICS: No lymphadenopathy CARDIAC: irreg, no murmurs, rubs, gallops RESPIRATORY:  Clear to auscultation without rales, wheezing or rhonchi  ABDOMEN: Soft, non-tender, non-distended MUSCULOSKELETAL:  No edema; No deformity  SKIN: Warm and dry NEUROLOGIC:  Alert and oriented x 3 PSYCHIATRIC:  Normal affect   ASSESSMENT:    1. Atypical chest pain   2. Persistent atrial fibrillation (HCC)   3. Chronic systolic heart failure (HCC)   4. Chest pain, unspecified type    PLAN:    In order of problems listed above:  Dilated cardiomyopathy, chronic systolic heart failure -EF 25% now improved to 60.  He was concerned that the [facility] that he started a couple years ago is causing his leg pain.  I stated that this would be  unusual.   He states that his legs are feeling better after stopping.  I am fine with him discontinuing/staying off of the Entresto low-dose.  His blood pressure also is a little bit better.  He is taking the clonidine at 0.2 mg twice a day now.  He feels better overall.  Lower extremity ultrasound/ABI was done and did not show any evidence of peripheral vascular disease.   -Continue  with goal dose metoprolol -May need to restart angiotensin receptor blocker in the future especially if blood pressure increases.  Chest discomfort -Having chest pressure especially at night.  Question if this may be food related.  We will go ahead and check a nuclear stress test.  It has been since 2015.  Previously was low risk.  Permanent atrial fibrillation -He is on high-dose metoprolol 200 mg tartrate twice a day.  Has right bundle branch block left anterior fascicular block.  No syncope.  This higher dose of metoprolol has been tolerated and is helping with adequate rate control for him.  His EKG heart rate was 70 today.  I am okay with him continuing with this higher dose.  Obviously if his heart rate were to reduce significantly, we will need to reduce it.  May require pacemaker at some point.  Chronic anticoagulation -On Coumadin.  Stable.  Last INR was 3.5.  Essential hypertension -Doing better off of the Entresto.  Continuing with clonidine 0.2 twice a day.  Blood pressure was high this morning until he took his medications.  Chronic kidney disease stage III -Followed by Dr. Hyman Hopes.  Last creatinine 1.55, potassium 4.7.  Right bundle branch block left anterior fascicular block bifascicular block -He is on high-dose of metoprolol for rate control.  At this point, no signs of worsening AV conduction.  Obviously if this does occur, metoprolol will need to be reduced.    Medication Adjustments/Labs and Tests Ordered: Current medicines are reviewed at length with the patient today.  Concerns regarding  medicines are outlined above.  Orders Placed This Encounter  Procedures  . MYOCARDIAL PERFUSION IMAGING   No orders of the defined types were placed in this encounter.   Patient Instructions  Medication Instructions:  The current medical regimen is effective;  continue present plan and medications.  *If you need a refill on your cardiac medications before your next appointment, please call your pharmacy*  Testing/Procedures: Your physician has requested that you have a lexiscan myoview. For further information please visit [email or web address] Please follow instruction sheet, as given.  Follow-Up: At [facility], you and your health needs are our priority.  As part of our continuing mission to provide you with exceptional heart care, we have created designated Provider Care Teams.  These Care Teams include your primary Cardiologist (physician) and Advanced Practice Providers (APPs -  Physician Assistants and Nurse Practitioners) who all work together to provide you with the care you need, when you need it.  We recommend signing up for the patient portal called "MyChart".  Sign up information is provided on this After Visit Summary.  MyChart is used to connect with patients for Virtual Visits (Telemedicine).  Patients are able to view lab/test results, encounter notes, upcoming appointments, etc.  Non-urgent messages can be sent to your provider as well.   To learn more about what you can do with MyChart, go to [email or web address].    Your next appointment:   4 month(s)  The format for your next appointment:   In Person  Provider:   Donato Schultz, MD   Thank you for choosing [facility]!!         Signed, Donato Schultz, MD  10/02/2019 9:59 AM    [facility]

## 2019-10-02 NOTE — Patient Instructions (Signed)
Description   Continue taking 1 tablet daily except for 1/2 tablet on Mondays and Fridays. Recheck INR in 5 weeks. Continue your leafy green vegetable intake consistent and or your Boost. Call us with any medication changes or questions to Coumadin Clinic [phone].

## 2019-10-04 ENCOUNTER — Telehealth (HOSPITAL_COMMUNITY): Payer: Self-pay | Admitting: *Deleted

## 2019-10-04 NOTE — Telephone Encounter (Signed)
Patient given detailed instructions per Myocardial Perfusion Study Information Sheet for the test on 10/09/19 at 7:45. Patient notified to arrive 15 minutes early and that it is imperative to arrive on time for appointment to keep from having the test rescheduled.  If you need to cancel or reschedule your appointment, please call the office within 24 hours of your appointment. . Patient verbalized understanding.Ralph Sullivan

## 2019-10-06 ENCOUNTER — Other Ambulatory Visit: Payer: Self-pay | Admitting: Cardiology

## 2019-10-06 DIAGNOSIS — I739 Peripheral vascular disease, unspecified: Secondary | ICD-10-CM

## 2019-10-09 ENCOUNTER — Ambulatory Visit (HOSPITAL_COMMUNITY): Payer: Medicare Other | Attending: Cardiovascular Disease

## 2019-10-09 ENCOUNTER — Other Ambulatory Visit: Payer: Self-pay

## 2019-10-09 DIAGNOSIS — R0789 Other chest pain: Secondary | ICD-10-CM

## 2019-10-09 LAB — MYOCARDIAL PERFUSION IMAGING
LV dias vol: 110 mL (ref 62–150)
LV sys vol: 65 mL
Peak HR: 87 {beats}/min
Rest HR: 73 {beats}/min
SDS: 10
SRS: 7
SSS: 18
TID: 0.93

## 2019-10-09 MED ORDER — REGADENOSON 0.4 MG/5ML IV SOLN
0.4000 mg | Freq: Once | INTRAVENOUS | Status: AC
  Administered 2019-10-09: 0.4 mg via INTRAVENOUS

## 2019-10-09 MED ORDER — TECHNETIUM TC 99M TETROFOSMIN IV KIT
10.7000 | PACK | Freq: Once | INTRAVENOUS | Status: AC | PRN
  Administered 2019-10-09: 10.7 via INTRAVENOUS
  Filled 2019-10-09: qty 11

## 2019-10-09 MED ORDER — TECHNETIUM TC 99M TETROFOSMIN IV KIT
30.6000 | PACK | Freq: Once | INTRAVENOUS | Status: AC | PRN
  Administered 2019-10-09: 30.6 via INTRAVENOUS
  Filled 2019-10-09: qty 31

## 2019-11-06 ENCOUNTER — Ambulatory Visit (INDEPENDENT_AMBULATORY_CARE_PROVIDER_SITE_OTHER): Payer: Medicare Other | Admitting: *Deleted

## 2019-11-06 ENCOUNTER — Other Ambulatory Visit: Payer: Self-pay

## 2019-11-06 ENCOUNTER — Other Ambulatory Visit: Payer: Self-pay | Admitting: Cardiology

## 2019-11-06 DIAGNOSIS — Z5181 Encounter for therapeutic drug level monitoring: Secondary | ICD-10-CM

## 2019-11-06 DIAGNOSIS — I4819 Other persistent atrial fibrillation: Secondary | ICD-10-CM

## 2019-11-06 LAB — POCT INR: INR: 1.5 — AB (ref 2.0–3.0)

## 2019-11-06 NOTE — Patient Instructions (Signed)
Description   Today take Warfarin 1 tablet and tomorrow take 1.5 tablets then continue taking 1 tablet daily except for 1/2 tablet on Mondays and Fridays. Recheck INR in 2 weeks. Continue your leafy green vegetable intake consistent and or your Boost. Call us with any medication changes or questions to Coumadin Clinic [phone].

## 2019-11-20 ENCOUNTER — Ambulatory Visit (INDEPENDENT_AMBULATORY_CARE_PROVIDER_SITE_OTHER): Payer: Medicare Other | Admitting: *Deleted

## 2019-11-20 ENCOUNTER — Other Ambulatory Visit: Payer: Self-pay

## 2019-11-20 DIAGNOSIS — Z5181 Encounter for therapeutic drug level monitoring: Secondary | ICD-10-CM | POA: Diagnosis not present

## 2019-11-20 LAB — POCT INR: INR: 1.6 — AB (ref 2.0–3.0)

## 2019-11-20 NOTE — Patient Instructions (Signed)
Description    Take 1.5 tablets of warfarin today and then start taking 1 tablet daily. Recheck INR in 2 weeks. Continue your leafy green vegetable intake and 2 boost per day. Call us with any medication changes or questions to Coumadin Clinic [phone].

## 2019-12-04 ENCOUNTER — Ambulatory Visit (INDEPENDENT_AMBULATORY_CARE_PROVIDER_SITE_OTHER): Payer: Medicare Other | Admitting: *Deleted

## 2019-12-04 ENCOUNTER — Other Ambulatory Visit: Payer: Self-pay

## 2019-12-04 DIAGNOSIS — Z5181 Encounter for therapeutic drug level monitoring: Secondary | ICD-10-CM | POA: Diagnosis not present

## 2019-12-04 DIAGNOSIS — I4819 Other persistent atrial fibrillation: Secondary | ICD-10-CM | POA: Diagnosis not present

## 2019-12-04 LAB — POCT INR: INR: 2.5 (ref 2.0–3.0)

## 2019-12-04 NOTE — Patient Instructions (Signed)
Description   Continue taking 1 tablet daily. Recheck INR in 3 weeks. Be consistent with your leafy green vegetable intake and 2 boost per day. Call us with any medication changes or questions to Coumadin Clinic [phone].

## 2019-12-14 DIAGNOSIS — N1832 Chronic kidney disease, stage 3b: Secondary | ICD-10-CM | POA: Diagnosis not present

## 2019-12-14 DIAGNOSIS — E1169 Type 2 diabetes mellitus with other specified complication: Secondary | ICD-10-CM | POA: Diagnosis not present

## 2019-12-14 DIAGNOSIS — I10 Essential (primary) hypertension: Secondary | ICD-10-CM | POA: Diagnosis not present

## 2019-12-25 ENCOUNTER — Ambulatory Visit (INDEPENDENT_AMBULATORY_CARE_PROVIDER_SITE_OTHER): Payer: Medicare Other | Admitting: Pharmacist

## 2019-12-25 ENCOUNTER — Other Ambulatory Visit: Payer: Self-pay

## 2019-12-25 DIAGNOSIS — Z5181 Encounter for therapeutic drug level monitoring: Secondary | ICD-10-CM | POA: Diagnosis not present

## 2019-12-25 DIAGNOSIS — I4819 Other persistent atrial fibrillation: Secondary | ICD-10-CM | POA: Diagnosis not present

## 2019-12-25 LAB — POCT INR: INR: 2.8 (ref 2.0–3.0)

## 2019-12-25 NOTE — Patient Instructions (Signed)
Continue taking 1 tablet daily. Recheck INR in 4 weeks. Be consistent with your leafy green vegetable intake and 2 boost per day. Call us with any medication changes or questions to Coumadin Clinic [phone].

## 2020-01-26 ENCOUNTER — Other Ambulatory Visit: Payer: Self-pay

## 2020-01-26 ENCOUNTER — Ambulatory Visit (INDEPENDENT_AMBULATORY_CARE_PROVIDER_SITE_OTHER): Payer: Medicare Other | Admitting: *Deleted

## 2020-01-26 DIAGNOSIS — Z5181 Encounter for therapeutic drug level monitoring: Secondary | ICD-10-CM | POA: Diagnosis not present

## 2020-01-26 DIAGNOSIS — Z23 Encounter for immunization: Secondary | ICD-10-CM | POA: Diagnosis not present

## 2020-01-26 DIAGNOSIS — I4819 Other persistent atrial fibrillation: Secondary | ICD-10-CM | POA: Diagnosis not present

## 2020-01-26 LAB — POCT INR: INR: 2 (ref 2.0–3.0)

## 2020-01-26 NOTE — Patient Instructions (Signed)
Description   Continue taking Warfarin 1 tablet daily. Recheck INR in 5 weeks. Be consistent with your leafy green vegetable intake and 2 boost per day. Call us with any medication changes or questions to Coumadin Clinic [phone].

## 2020-02-05 ENCOUNTER — Ambulatory Visit (INDEPENDENT_AMBULATORY_CARE_PROVIDER_SITE_OTHER): Payer: Medicare Other | Admitting: Cardiology

## 2020-02-05 ENCOUNTER — Other Ambulatory Visit: Payer: Self-pay

## 2020-02-05 ENCOUNTER — Encounter: Payer: Self-pay | Admitting: Cardiology

## 2020-02-05 VITALS — BP 120/70 | HR 54 | Ht 72.0 in | Wt 200.0 lb

## 2020-02-05 DIAGNOSIS — Z7901 Long term (current) use of anticoagulants: Secondary | ICD-10-CM

## 2020-02-05 DIAGNOSIS — I452 Bifascicular block: Secondary | ICD-10-CM

## 2020-02-05 DIAGNOSIS — I4819 Other persistent atrial fibrillation: Secondary | ICD-10-CM | POA: Diagnosis not present

## 2020-02-05 DIAGNOSIS — I10 Essential (primary) hypertension: Secondary | ICD-10-CM | POA: Diagnosis not present

## 2020-02-05 DIAGNOSIS — I5022 Chronic systolic (congestive) heart failure: Secondary | ICD-10-CM

## 2020-02-05 DIAGNOSIS — I77810 Thoracic aortic ectasia: Secondary | ICD-10-CM

## 2020-02-05 NOTE — Progress Notes (Signed)
Cardiology Office Note:    Date:  02/05/2020   ID:  Ralph Sullivan, DOB March 16, 1946, MRN [id number]  PCP:  Lorenda Ishihara, MD  [facility] Cardiologist:  Donato Schultz, MD  [facility] Electrophysiologist:  None   Referring MD: Lorenda Ishihara,*     History of Present Illness:    Ralph Sullivan is a 74 y.o. male here for the follow-up of chronic systolic heart failure with EF from 25 up to 60%, permanent atrial fibrillation.  Took Entresto but then ended up stopping.  Concerned that this was causing his leg pain which I thought would be unusual.  He feels much better after stopping the medication.  At prior visit we checked a nuclear stress test which overall was low risk.  Was having some chest discomfort especially at night that may have been food related.  Overall no fevers chills nausea vomiting syncope bleeding.  Please see below for further details.  Past Medical History:  Diagnosis Date  . Atrial fibrillation (HCC) 02/08/2013   CHADS-VAS - 2 (AGE, HTN). On anticoagulation. Holter 6/14 - 2.3 sec pause. Avg HR 87. Rate control   . Bifascicular block 03/28/2013  . Bilateral congenital hammer toes   . Chronic anticoagulation 03/28/2013  . Chronic kidney disease, stage III (moderate) (HCC) 03/28/2013  . 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 Gout   . Hyperhidrosis 03/28/2013  . Obesity, unspecified 03/28/2013  . Rhabdomyolysis 09/2015  . 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 CATARACT EXTRACTION    . HERNIA REPAIR    . INGUINAL HERNIA REPAIR  07/16/2016  . INGUINAL HERNIA REPAIR Left 07/16/2016   Procedure: LAPAROSCOPIC INGUINAL HERNIA REPAIR;  Surgeon: De Blanch Kinsinger, MD;  Location: [facility] OR;  Service: General;  Laterality: Left;  . INSERTION OF MESH Left 07/16/2016   Procedure: INSERTION OF MESH;  Surgeon: Rodman Pickle,  MD;  Location: [facility] OR;  Service: General;  Laterality: Left;  . KIDNEY STONE SURGERY      Current Medications: Current Meds  Medication Sig  . allopurinol (ZYLOPRIM) 300 MG tablet Take 300 mg by mouth daily.   . CHROMIUM-CINNAMON PO Take 200 mg by mouth at bedtime.   . cloNIDine (CATAPRES) 0.2 MG tablet Take 1 tablet by mouth twice daily  . ferrous sulfate 325 (65 FE) MG tablet Take 325 mg by mouth daily with breakfast.  . fluticasone (FLONASE) 50 MCG/ACT nasal spray Place 2 sprays into both nostrils daily.   . metoprolol (LOPRESSOR) 100 MG tablet Take 200 mg by mouth 2 (two) times daily.   . Omega-3 Fatty Acids (OMEGA 3 PO) Take 1 capsule by mouth 2 (two) times daily.   . vitamin E 400 UNIT capsule Take 400 Units by mouth daily.  Marland Kitchen warfarin (COUMADIN) 5 MG tablet TAKE 1/2 TO 1 (ONE-HALF TO ONE) TABLET BY MOUTH DAILY AS DIRECTED BY  COUMADIN  CLINIC     Allergies:   Duloxetine, Oxycontin [oxycodone hcl], and Sulfur   Social History   Socioeconomic History  . Marital status: Married    Spouse name: Not on file  . Number of children: Not on file  . Years of education: Not on file  . Highest education level: Not on file  Occupational History  . Not on file  Tobacco Use  . Smoking status: Former Engineer, civil (consulting)  date: 03/1970    Years since quitting: 49.8  . Smokeless tobacco: Never Used  Vaping Use  . Vaping Use: Never used  Substance and Sexual Activity  . Alcohol use: No    Alcohol/week: 0.0 standard drinks  . Drug use: No  . Sexual activity: Not on file  Other Topics Concern  . Not on file  Social History Narrative  . Not on file   Social Determinants of Health   Financial Resource Strain:   . Difficulty of Paying Living Expenses: Not on file  Food Insecurity:   . Worried About Programme researcher, broadcasting/film/video in the Last Year: Not on file  . Ran Out of Food in the Last Year: Not on file  Transportation Needs:   . Lack of Transportation (Medical): Not on file  . Lack of  Transportation (Non-Medical): Not on file  Physical Activity:   . Days of Exercise per Week: Not on file  . Minutes of Exercise per Session: Not on file  Stress:   . Feeling of Stress : Not on file  Social Connections:   . Frequency of Communication with Friends and Family: Not on file  . Frequency of Social Gatherings with Friends and Family: Not on file  . Attends Religious Services: Not on file  . Active Member of Clubs or Organizations: Not on file  . Attends Banker Meetings: Not on file  . Marital Status: Not on file     Family History: The patient's family history includes Diabetes in his mother; Hypertension in his mother.  ROS:   Please see the history of present illness.     All other systems reviewed and are negative.  EKGs/Labs/Other Studies Reviewed:    Recent Labs: No results found for requested labs within last 8760 hours.  Recent Lipid Panel No results found for: CHOL, TRIG, HDL, CHOLHDL, VLDL, LDLCALC, LDLDIRECT   Risk Assessment/Calculations:     CHA2DS2-VASc Score = 3  This indicates a 3.2% annual risk of stroke. The patient's score is based upon: CHF History: 1 HTN History: 1 Diabetes History: 0 Stroke History: 0 Vascular Disease History: 0 Age Score: 1 Gender Score: 0      Physical Exam:    VS:  BP 120/70   Pulse (!) 54   Ht 6' (1.829 m)   Wt 200 lb (90.7 kg)   SpO2 98%   BMI 27.12 kg/m     Wt Readings from Last 3 Encounters:  02/05/20 200 lb (90.7 kg)  10/09/19 196 lb (88.9 kg)  10/02/19 196 lb (88.9 kg)     GEN:  Well nourished, well developed in no acute distress, utilizing walker. HEENT: Normal NECK: No JVD; No carotid bruits LYMPHATICS: No lymphadenopathy CARDIAC: irreg, no murmurs, rubs, gallops RESPIRATORY:  Clear to auscultation without rales, wheezing or rhonchi  ABDOMEN: Soft, non-tender, non-distended MUSCULOSKELETAL:  No edema; No deformity  SKIN: Warm and dry NEUROLOGIC:  Alert and oriented x  3 PSYCHIATRIC:  Normal affect   ASSESSMENT:    1. Persistent atrial fibrillation (HCC)   2. Chronic systolic heart failure (HCC)   3. Essential hypertension   4. Chronic anticoagulation   5. Dilated aortic root (HCC)   6. Bifascicular block    PLAN:    In order of problems listed above:  Permanent atrial fibrillation -Continuing with current medical management with metoprolol 200 mg twice a day which is quite high but has been maintaining his adequate heart rate control.  No syncope, no  palpitations.  Chronic anticoagulation -Maintaining close monitoring of warfarin here in our clinic with frequent INR checks to monitor for any signs of toxicity, hemoglobin last 14.4, creatinine 1.5.  Previous INR 2.0.  At goal.  High risk medication management with frequent checks.  Dilated cardiomyopathy with chronic systolic heart failure -Echocardiogram reviewed from 03/13/2019 shows normal EF currently.  Excellent.  Previously was in the 20s. -Continue with metoprolol.  Not on ACE inhibitor or ARB because of chronic kidney disease. -NYHA class II symptoms.  Arthritis seems to be hindering him the most.  Dilated aortic root -Approximately 43 mm stable.  We will continue to monitor.  Echocardiogram should be repeated in mid November 2021  Essential hypertension -Has been on both metoprolol as well as clonidine 0.2 mg twice a day.  Excellent control.  No changes made.  Left anterior fascicular block -Noted on ECG previously.  Watch for any signs of syncope especially with AV nodal blocker on board.  Leg pain -ABIs were performed with vascular ultrasound on 07/24/2019 that showed no evidence of significant peripheral vascular disease.  Excellent. OA, MSK. Had PT prior.  His arthritis seems to be the most frustrating feature for him.  Also in his hands.  Has had occupational therapy, rubber stopper put over fork to help him grip.   Medication Adjustments/Labs and Tests Ordered: Current  medicines are reviewed at length with the patient today.  Concerns regarding medicines are outlined above.  Orders Placed This Encounter  Procedures  . ECHOCARDIOGRAM COMPLETE   No orders of the defined types were placed in this encounter.   Patient Instructions  Medication Instructions:  No changes *If you need a refill on your cardiac medications before your next appointment, please call your pharmacy*   Lab Work: none If you have labs (blood work) drawn today and your tests are completely normal, you will receive your results only by: Marland Kitchen MyChart Message (if you have MyChart) OR . A paper copy in the mail If you have any lab test that is abnormal or we need to change your treatment, we will call you to review the results.   Testing/Procedures:  DUE AFTER Mar 12, 2020 Your physician has requested that you have an echocardiogram. Echocardiography is a painless test that uses sound waves to create images of your heart. It provides your doctor with information about the size and shape of your heart and how well your heart's chambers and valves are working. This procedure takes approximately one hour. There are no restrictions for this procedure.   Follow-Up: At [facility], you and your health needs are our priority.  As part of our continuing mission to provide you with exceptional heart care, we have created designated Provider Care Teams.  These Care Teams include your primary Cardiologist (physician) and Advanced Practice Providers (APPs -  Physician Assistants and Nurse Practitioners) who all work together to provide you with the care you need, when you need it.   Your next appointment:   6 month(s)  The format for your next appointment:   In Person  Provider:   You may see Donato Schultz, MD or one of the following Advanced Practice Providers on your designated Care Team:    Norma Fredrickson, NP  Nada Boozer, NP  Georgie Chard, NP    Other Instructions       Signed, Donato Schultz, MD  02/05/2020 9:29 AM    [facility]

## 2020-02-05 NOTE — Patient Instructions (Addendum)
Medication Instructions:  No changes *If you need a refill on your cardiac medications before your next appointment, please call your pharmacy*   Lab Work: none If you have labs (blood work) drawn today and your tests are completely normal, you will receive your results only by:  MyChart Message (if you have MyChart) OR  A paper copy in the mail If you have any lab test that is abnormal or we need to change your treatment, we will call you to review the results.   Testing/Procedures:  DUE AFTER Mar 12, 2020 Your physician has requested that you have an echocardiogram. Echocardiography is a painless test that uses sound waves to create images of your heart. It provides your doctor with information about the size and shape of your heart and how well your hearts chambers and valves are working. This procedure takes approximately one hour. There are no restrictions for this procedure.   Follow-Up: At [facility], you and your health needs are our priority.  As part of our continuing mission to provide you with exceptional heart care, we have created designated Provider Care Teams.  These Care Teams include your primary Cardiologist (physician) and Advanced Practice Providers (APPs -  Physician Assistants and Nurse Practitioners) who all work together to provide you with the care you need, when you need it.   Your next appointment:   6 month(s)  The format for your next appointment:   In Person  Provider:   You may see Donato Schultz, MD or one of the following Advanced Practice Providers on your designated Care Team:    Norma Fredrickson, NP  Nada Boozer, NP  Georgie Chard, NP    Other Instructions

## 2020-02-08 DIAGNOSIS — M170 Bilateral primary osteoarthritis of knee: Secondary | ICD-10-CM | POA: Diagnosis not present

## 2020-02-26 ENCOUNTER — Ambulatory Visit (INDEPENDENT_AMBULATORY_CARE_PROVIDER_SITE_OTHER): Payer: Medicare Other

## 2020-02-26 ENCOUNTER — Other Ambulatory Visit: Payer: Self-pay

## 2020-02-26 DIAGNOSIS — I4891 Unspecified atrial fibrillation: Secondary | ICD-10-CM | POA: Diagnosis not present

## 2020-02-26 DIAGNOSIS — Z5181 Encounter for therapeutic drug level monitoring: Secondary | ICD-10-CM

## 2020-02-26 LAB — POCT INR: INR: 1.7 — AB (ref 2.0–3.0)

## 2020-03-04 DIAGNOSIS — M1712 Unilateral primary osteoarthritis, left knee: Secondary | ICD-10-CM | POA: Diagnosis not present

## 2020-03-04 DIAGNOSIS — M1711 Unilateral primary osteoarthritis, right knee: Secondary | ICD-10-CM | POA: Diagnosis not present

## 2020-03-04 DIAGNOSIS — M170 Bilateral primary osteoarthritis of knee: Secondary | ICD-10-CM | POA: Diagnosis not present

## 2020-03-11 DIAGNOSIS — M170 Bilateral primary osteoarthritis of knee: Secondary | ICD-10-CM | POA: Diagnosis not present

## 2020-03-11 DIAGNOSIS — M1711 Unilateral primary osteoarthritis, right knee: Secondary | ICD-10-CM | POA: Diagnosis not present

## 2020-03-11 DIAGNOSIS — M1712 Unilateral primary osteoarthritis, left knee: Secondary | ICD-10-CM | POA: Diagnosis not present

## 2020-03-18 ENCOUNTER — Ambulatory Visit (HOSPITAL_COMMUNITY): Payer: Medicare Other | Attending: Cardiology

## 2020-03-18 ENCOUNTER — Other Ambulatory Visit: Payer: Self-pay

## 2020-03-18 DIAGNOSIS — I10 Essential (primary) hypertension: Secondary | ICD-10-CM

## 2020-03-18 DIAGNOSIS — I4819 Other persistent atrial fibrillation: Secondary | ICD-10-CM

## 2020-03-18 DIAGNOSIS — I5022 Chronic systolic (congestive) heart failure: Secondary | ICD-10-CM | POA: Diagnosis not present

## 2020-03-18 DIAGNOSIS — I452 Bifascicular block: Secondary | ICD-10-CM | POA: Diagnosis not present

## 2020-03-18 DIAGNOSIS — I77810 Thoracic aortic ectasia: Secondary | ICD-10-CM

## 2020-03-18 DIAGNOSIS — Z7901 Long term (current) use of anticoagulants: Secondary | ICD-10-CM

## 2020-03-18 LAB — ECHOCARDIOGRAM COMPLETE
Area-P 1/2: 3.35 cm2
S' Lateral: 3.6 cm

## 2020-03-19 DIAGNOSIS — M1711 Unilateral primary osteoarthritis, right knee: Secondary | ICD-10-CM | POA: Diagnosis not present

## 2020-03-19 DIAGNOSIS — M1712 Unilateral primary osteoarthritis, left knee: Secondary | ICD-10-CM | POA: Diagnosis not present

## 2020-03-19 DIAGNOSIS — M170 Bilateral primary osteoarthritis of knee: Secondary | ICD-10-CM | POA: Diagnosis not present

## 2020-03-20 ENCOUNTER — Encounter: Payer: Self-pay | Admitting: *Deleted

## 2020-03-25 ENCOUNTER — Ambulatory Visit (INDEPENDENT_AMBULATORY_CARE_PROVIDER_SITE_OTHER): Payer: Medicare Other | Admitting: Pharmacist

## 2020-03-25 ENCOUNTER — Other Ambulatory Visit: Payer: Self-pay

## 2020-03-25 DIAGNOSIS — Z5181 Encounter for therapeutic drug level monitoring: Secondary | ICD-10-CM | POA: Diagnosis not present

## 2020-03-25 LAB — POCT INR: INR: 2.8 (ref 2.0–3.0)

## 2020-03-25 NOTE — Patient Instructions (Signed)
Continue taking Warfarin 1 tablet daily. Recheck INR in 6 weeks. Be consistent with your leafy green vegetable intake and 2 boost per day. Call us with any medication changes or questions to [facility] [phone].

## 2020-04-01 DIAGNOSIS — Z23 Encounter for immunization: Secondary | ICD-10-CM | POA: Diagnosis not present

## 2020-05-07 ENCOUNTER — Other Ambulatory Visit: Payer: Self-pay

## 2020-05-07 ENCOUNTER — Ambulatory Visit (INDEPENDENT_AMBULATORY_CARE_PROVIDER_SITE_OTHER): Payer: Medicare Other | Admitting: *Deleted

## 2020-05-07 DIAGNOSIS — D631 Anemia in chronic kidney disease: Secondary | ICD-10-CM | POA: Diagnosis not present

## 2020-05-07 DIAGNOSIS — Z5181 Encounter for therapeutic drug level monitoring: Secondary | ICD-10-CM | POA: Diagnosis not present

## 2020-05-07 DIAGNOSIS — I129 Hypertensive chronic kidney disease with stage 1 through stage 4 chronic kidney disease, or unspecified chronic kidney disease: Secondary | ICD-10-CM | POA: Diagnosis not present

## 2020-05-07 DIAGNOSIS — I4891 Unspecified atrial fibrillation: Secondary | ICD-10-CM | POA: Diagnosis not present

## 2020-05-07 DIAGNOSIS — N400 Enlarged prostate without lower urinary tract symptoms: Secondary | ICD-10-CM | POA: Diagnosis not present

## 2020-05-07 DIAGNOSIS — R972 Elevated prostate specific antigen [PSA]: Secondary | ICD-10-CM | POA: Diagnosis not present

## 2020-05-07 DIAGNOSIS — N2581 Secondary hyperparathyroidism of renal origin: Secondary | ICD-10-CM | POA: Diagnosis not present

## 2020-05-07 DIAGNOSIS — N183 Chronic kidney disease, stage 3 unspecified: Secondary | ICD-10-CM | POA: Diagnosis not present

## 2020-05-07 DIAGNOSIS — I77819 Aortic ectasia, unspecified site: Secondary | ICD-10-CM | POA: Diagnosis not present

## 2020-05-07 LAB — POCT INR: INR: 1.7 — AB (ref 2.0–3.0)

## 2020-05-07 NOTE — Patient Instructions (Signed)
Description   Take 1.5 tablets today of warfarin, then continue taking Warfarin 1 tablet daily. Recheck INR in 3 weeks. Be consistent with your leafy green vegetable intake and 2 boost per day. Call us with any medication changes or questions to [facility] [phone].

## 2020-05-28 ENCOUNTER — Ambulatory Visit (INDEPENDENT_AMBULATORY_CARE_PROVIDER_SITE_OTHER): Payer: Medicare Other | Admitting: *Deleted

## 2020-05-28 ENCOUNTER — Other Ambulatory Visit: Payer: Self-pay

## 2020-05-28 DIAGNOSIS — Z5181 Encounter for therapeutic drug level monitoring: Secondary | ICD-10-CM

## 2020-05-28 LAB — POCT INR: INR: 3.4 — AB (ref 2.0–3.0)

## 2020-05-28 NOTE — Patient Instructions (Signed)
Description   Hold warfarin today then continue taking Warfarin 1 tablet daily. Recheck INR in 3 weeks. Be consistent with your leafy green vegetable intake and alternating between 1 and 2 boost every other day. Call us with any medication changes or questions to [facility] [phone].

## 2020-06-11 DIAGNOSIS — N1832 Chronic kidney disease, stage 3b: Secondary | ICD-10-CM | POA: Diagnosis not present

## 2020-06-11 DIAGNOSIS — Z7189 Other specified counseling: Secondary | ICD-10-CM | POA: Diagnosis not present

## 2020-06-11 DIAGNOSIS — F418 Other specified anxiety disorders: Secondary | ICD-10-CM | POA: Diagnosis not present

## 2020-06-11 DIAGNOSIS — E785 Hyperlipidemia, unspecified: Secondary | ICD-10-CM | POA: Diagnosis not present

## 2020-06-11 DIAGNOSIS — Z0000 Encounter for general adult medical examination without abnormal findings: Secondary | ICD-10-CM | POA: Diagnosis not present

## 2020-06-11 DIAGNOSIS — D6859 Other primary thrombophilia: Secondary | ICD-10-CM | POA: Diagnosis not present

## 2020-06-11 DIAGNOSIS — R269 Unspecified abnormalities of gait and mobility: Secondary | ICD-10-CM | POA: Diagnosis not present

## 2020-06-11 DIAGNOSIS — I482 Chronic atrial fibrillation, unspecified: Secondary | ICD-10-CM | POA: Diagnosis not present

## 2020-06-11 DIAGNOSIS — Z789 Other specified health status: Secondary | ICD-10-CM | POA: Diagnosis not present

## 2020-06-11 DIAGNOSIS — E1169 Type 2 diabetes mellitus with other specified complication: Secondary | ICD-10-CM | POA: Diagnosis not present

## 2020-06-11 DIAGNOSIS — M109 Gout, unspecified: Secondary | ICD-10-CM | POA: Diagnosis not present

## 2020-06-11 DIAGNOSIS — G894 Chronic pain syndrome: Secondary | ICD-10-CM | POA: Diagnosis not present

## 2020-06-11 DIAGNOSIS — I10 Essential (primary) hypertension: Secondary | ICD-10-CM | POA: Diagnosis not present

## 2020-06-18 ENCOUNTER — Ambulatory Visit (INDEPENDENT_AMBULATORY_CARE_PROVIDER_SITE_OTHER): Payer: Medicare Other

## 2020-06-18 ENCOUNTER — Other Ambulatory Visit: Payer: Self-pay

## 2020-06-18 DIAGNOSIS — I4819 Other persistent atrial fibrillation: Secondary | ICD-10-CM

## 2020-06-18 DIAGNOSIS — Z5181 Encounter for therapeutic drug level monitoring: Secondary | ICD-10-CM

## 2020-06-18 DIAGNOSIS — E1169 Type 2 diabetes mellitus with other specified complication: Secondary | ICD-10-CM | POA: Diagnosis not present

## 2020-06-18 LAB — POCT INR: INR: 1.9 — AB (ref 2.0–3.0)

## 2020-06-18 MED ORDER — WARFARIN SODIUM 5 MG PO TABS: ORAL_TABLET | ORAL | 1 refills | Status: DC

## 2020-06-18 NOTE — Patient Instructions (Signed)
Description   Take 1.5 tablets today, then resume same dosage Warfarin 1 tablet daily. Recheck INR in 3 weeks. Drinking 2 boost daily. Call us with any medication changes or questions to [facility] [phone].

## 2020-07-09 ENCOUNTER — Ambulatory Visit (INDEPENDENT_AMBULATORY_CARE_PROVIDER_SITE_OTHER): Payer: Medicare Other | Admitting: *Deleted

## 2020-07-09 ENCOUNTER — Other Ambulatory Visit: Payer: Self-pay

## 2020-07-09 DIAGNOSIS — Z5181 Encounter for therapeutic drug level monitoring: Secondary | ICD-10-CM | POA: Diagnosis not present

## 2020-07-09 DIAGNOSIS — I4819 Other persistent atrial fibrillation: Secondary | ICD-10-CM | POA: Diagnosis not present

## 2020-07-09 LAB — POCT INR: INR: 2.4 (ref 2.0–3.0)

## 2020-07-09 NOTE — Patient Instructions (Signed)
Description   Continue taking Warfarin 1 tablet daily. Recheck INR in 4 weeks. Drinking 2 boost daily. Call us with any medication changes or questions to [facility] [phone].

## 2020-08-06 ENCOUNTER — Other Ambulatory Visit: Payer: Self-pay

## 2020-08-06 ENCOUNTER — Ambulatory Visit (INDEPENDENT_AMBULATORY_CARE_PROVIDER_SITE_OTHER): Payer: Medicare Other | Admitting: Pharmacist

## 2020-08-06 DIAGNOSIS — I4819 Other persistent atrial fibrillation: Secondary | ICD-10-CM

## 2020-08-06 DIAGNOSIS — Z5181 Encounter for therapeutic drug level monitoring: Secondary | ICD-10-CM | POA: Diagnosis not present

## 2020-08-06 LAB — POCT INR: INR: 2.5 (ref 2.0–3.0)

## 2020-08-06 NOTE — Patient Instructions (Signed)
Description   Continue taking Warfarin 1 tablet daily. Recheck INR in 4 weeks - same day as visit with Dr Anne Fu. Drinking 2 boost daily. Call us with any medication changes or questions to [facility] Clinic [phone].

## 2020-09-05 ENCOUNTER — Ambulatory Visit (INDEPENDENT_AMBULATORY_CARE_PROVIDER_SITE_OTHER): Payer: Medicare Other | Admitting: Cardiology

## 2020-09-05 ENCOUNTER — Other Ambulatory Visit: Payer: Self-pay

## 2020-09-05 ENCOUNTER — Ambulatory Visit (INDEPENDENT_AMBULATORY_CARE_PROVIDER_SITE_OTHER): Payer: Medicare Other

## 2020-09-05 ENCOUNTER — Encounter: Payer: Self-pay | Admitting: Cardiology

## 2020-09-05 VITALS — BP 120/78 | HR 64 | Ht 72.0 in | Wt 199.2 lb

## 2020-09-05 DIAGNOSIS — I4819 Other persistent atrial fibrillation: Secondary | ICD-10-CM | POA: Diagnosis not present

## 2020-09-05 DIAGNOSIS — I5022 Chronic systolic (congestive) heart failure: Secondary | ICD-10-CM

## 2020-09-05 DIAGNOSIS — I77810 Thoracic aortic ectasia: Secondary | ICD-10-CM

## 2020-09-05 DIAGNOSIS — Z5181 Encounter for therapeutic drug level monitoring: Secondary | ICD-10-CM | POA: Diagnosis not present

## 2020-09-05 DIAGNOSIS — I452 Bifascicular block: Secondary | ICD-10-CM | POA: Diagnosis not present

## 2020-09-05 DIAGNOSIS — R079 Chest pain, unspecified: Secondary | ICD-10-CM | POA: Diagnosis not present

## 2020-09-05 LAB — POCT INR: INR: 2.4 (ref 2.0–3.0)

## 2020-09-05 MED ORDER — WARFARIN SODIUM 5 MG PO TABS: ORAL_TABLET | ORAL | 1 refills | Status: DC

## 2020-09-05 NOTE — Patient Instructions (Signed)

## 2020-09-05 NOTE — Progress Notes (Signed)
Cardiology Office Note:    Date:  09/05/2020   ID:  Ralph Sullivan, DOB 1945-07-17, MRN [id number]  PCP:  Lorenda Ishihara, MD   [facility] Providers Cardiologist:  Donato Schultz, MD     Referring MD: Lorenda Ishihara,*     History of Present Illness:    Ralph Sullivan is a 75 y.o. male here for follow-up of chronic systolic heart failure EF improvement from 25 up to 60% with permanent atrial fibrillation right bundle branch block left anterior fascicular block.  Overall doing well without any fevers chills nausea vomiting syncope bleeding.  Left knee. Holding off surgery  Dr. Hyman Hopes. K normal now  NUC stress: 09/2019   Nuclear stress EF: 41%.  There was no ST segment deviation noted during stress.  No T wave inversion was noted during stress.  Findings consistent with prior myocardial infarction.  This is an intermediate risk study.  The left ventricular ejection fraction is moderately decreased (30-44%).   1. There is a medium size (10-15% of LV), fixed, moderate to severe perfusion defect present in the basal to apical inferior segments and apex consistent with prior infarction. The wall motion in this region is hypokinetic, favoring prior infarction versus diaphragm attenuation.  2. No ischemia.  3. Moderately reduced LVEF, 41%.  4. This is an intermediate risk study.    Past Medical History:  Diagnosis Date  . Atrial fibrillation (HCC) 02/08/2013   CHADS-VAS - 2 (AGE, HTN). On anticoagulation. Holter 6/14 - 2.3 sec pause. Avg HR 87. Rate control   . Bifascicular block 03/28/2013  . Bilateral congenital hammer toes   . Chronic anticoagulation 03/28/2013  . Chronic kidney disease, stage III (moderate) (HCC) 03/28/2013  . 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 Gout   . Hyperhidrosis 03/28/2013  . Obesity, unspecified 03/28/2013  . Rhabdomyolysis 09/2015  . 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 CATARACT EXTRACTION    . HERNIA REPAIR    . INGUINAL HERNIA REPAIR  07/16/2016  . INGUINAL HERNIA REPAIR Left 07/16/2016   Procedure: LAPAROSCOPIC INGUINAL HERNIA REPAIR;  Surgeon: De Blanch Kinsinger, MD;  Location: [facility] OR;  Service: General;  Laterality: Left;  . INSERTION OF MESH Left 07/16/2016   Procedure: INSERTION OF MESH;  Surgeon: Rodman Pickle, MD;  Location: [facility] OR;  Service: General;  Laterality: Left;  . KIDNEY STONE SURGERY      Current Medications: Current Meds  Medication Sig  . allopurinol (ZYLOPRIM) 300 MG tablet Take 300 mg by mouth daily.   . carbamide peroxide (DEBROX) 6.5 % OTIC solution as needed.  . CHROMIUM-CINNAMON PO Take 200 mg by mouth at bedtime.   . cloNIDine (CATAPRES) 0.2 MG tablet Take 1 tablet by mouth twice daily  . ferrous sulfate 325 (65 FE) MG tablet Take 325 mg by mouth daily with breakfast.  . fluticasone (FLONASE) 50 MCG/ACT nasal spray Place 2 sprays into both nostrils daily.   . metoprolol (LOPRESSOR) 100 MG tablet Take 200 mg by mouth 2 (two) times daily.   . Omega-3 Fatty Acids (OMEGA 3 PO) Take 1 capsule by mouth 2 (two) times daily.   . vitamin E 400 UNIT capsule Take 400 Units by mouth daily.  . [DISCONTINUED] warfarin (COUMADIN) 5 MG tablet TAKE 1/2 TO 1 (ONE-HALF TO ONE) TABLET BY MOUTH DAILY AS DIRECTED BY  COUMADIN  CLINIC     Allergies:   Allopurinol, Colchicine, Duloxetine, Elemental sulfur, and Oxycontin [oxycodone hcl]   Social History   Socioeconomic History  . Marital status: Married    Spouse name: Not on file  . Number of children: Not on file  . Years of education: Not on file  . Highest education level: Not on file  Occupational History  . Not on file  Tobacco Use  . Smoking status: Former Smoker    Quit date: 03/1970    Years since quitting: 50.4  . Smokeless tobacco: Never Used  Vaping Use  . Vaping Use: Never used  Substance  and Sexual Activity  . Alcohol use: No    Alcohol/week: 0.0 standard drinks  . Drug use: No  . Sexual activity: Not on file  Other Topics Concern  . Not on file  Social History Narrative  . Not on file   Social Determinants of Health   Financial Resource Strain: Not on file  Food Insecurity: Not on file  Transportation Needs: Not on file  Physical Activity: Not on file  Stress: Not on file  Social Connections: Not on file     Family History: The patient's family history includes Diabetes in his mother; Hypertension in his mother.  ROS:   Please see the history of present illness.     All other systems reviewed and are negative.  EKGs/Labs/Other Studies Reviewed:     EKG:  EKG is  ordered today.  The ekg ordered today demonstrates atrial fibrillation 60 bpm with right bundle branch block left anterior fascicular block  Recent Labs: No results found for requested labs within last 8760 hours.  Recent Lipid Panel No results found for: CHOL, TRIG, HDL, CHOLHDL, VLDL, LDLCALC, LDLDIRECT   Risk Assessment/Calculations:      Physical Exam:    VS:  BP 120/78   Pulse 64   Ht 6' (1.829 m)   Wt 199 lb 3.2 oz (90.4 kg)   SpO2 100%   BMI 27.02 kg/m     Wt Readings from Last 3 Encounters:  09/05/20 199 lb 3.2 oz (90.4 kg)  02/05/20 200 lb (90.7 kg)  10/09/19 196 lb (88.9 kg)     GEN:  Well nourished, well developed in no acute distress HEENT: Normal NECK: No JVD; No carotid bruits LYMPHATICS: No lymphadenopathy CARDIAC: irreg, no murmurs, rubs, gallops RESPIRATORY:  Clear to auscultation without rales, wheezing or rhonchi  ABDOMEN: Soft, non-tender, non-distended MUSCULOSKELETAL:  No edema; No deformity  SKIN: Warm and dry NEUROLOGIC:  Alert and oriented x 3 PSYCHIATRIC:  Normal affect   ASSESSMENT:    1. Persistent atrial fibrillation (HCC)   2. Chronic systolic heart failure (HCC)   3. Bifascicular block   4. Dilated aortic root (HCC)   5. Chest pain,  unspecified type    PLAN:    In order of problems listed above:  Chest pressure - Sometimes wakes up in the morning with chest pressure.  After he gets up and moves around for about 30 minutes it dissipates.  Does not appear to be exertional during the day.  Nuclear stress test was performed in June 2021 and overall did not show any evidence of significant ischemia.  Prior infarct pattern noted as previously described.  We will continue to monitor this.  Permanent atrial fibrillation - Overall excellent rate control.  EKG today shows atrial fibrillation heart rate of 60.  Continue with current medication management metoprolol 200 mg twice a  day.  We will refill as needed.  Chronic anticoagulation - Continue with Coumadin.  No bleeding.  Outside labs show hemoglobin 14.9 creatinine 1.8 TSH 3.6.  Last INR 2.4 therapeutic.  No bleeding problems and monitoring this high risk medication.  Careful and close monitoring.  Essential hypertension - Excellently controlled currently with clonidine 0.2 mg twice a day as well as metoprolol as above.  Refills as needed.  Continue current medication management.  Bifascicular block - Watch for any signs of worsening conduction disorder.  Dilated cardiomyopathy with chronic systolic heart failure - EF in 2020 showed improvement to 45 to 50%..  Currently not on any ACE inhibitor.  Chronic kidney disease.  Continue with current medical management with metoprolol.  Refills as needed.  Dilated aortic root - 43 mm previously.  Last echocardiogram personally reviewed and 03/18/2020.  Continue with good blood pressure control.  Chronic kidney disease stage IIIb - Watching closely with Dr. Hyman Hopes.  Avoiding NSAIDs.  Takes Tylenol for pain.  Last creatinine 1.8.  Potassium at 1 point was elevated but on repeat was normal.     Medication Adjustments/Labs and Tests Ordered: Current medicines are reviewed at length with the patient today.  Concerns regarding  medicines are outlined above.  Orders Placed This Encounter  Procedures  . EKG 12-Lead   No orders of the defined types were placed in this encounter.   Patient Instructions  Medication Instructions:  The current medical regimen is effective;  continue present plan and medications.  *If you need a refill on your cardiac medications before your next appointment, please call your pharmacy*  Follow-Up: At [facility], you and your health needs are our priority.  As part of our continuing mission to provide you with exceptional heart care, we have created designated Provider Care Teams.  These Care Teams include your primary Cardiologist (physician) and Advanced Practice Providers (APPs -  Physician Assistants and Nurse Practitioners) who all work together to provide you with the care you need, when you need it.  We recommend signing up for the patient portal called "MyChart".  Sign up information is provided on this After Visit Summary.  MyChart is used to connect with patients for Virtual Visits (Telemedicine).  Patients are able to view lab/test results, encounter notes, upcoming appointments, etc.  Non-urgent messages can be sent to your provider as well.   To learn more about what you can do with MyChart, go to [email or web address].    Your next appointment:   6 month(s)  The format for your next appointment:   In Person  Provider:   Donato Schultz, MD   Thank you for choosing [facility]!!        Signed, Donato Schultz, MD  09/05/2020 10:33 AM    [facility]

## 2020-09-05 NOTE — Patient Instructions (Signed)
Description   Continue taking Warfarin 1 tablet daily. Recheck INR in 4 weeks. Drinking 2 boost daily. Call us with any medication changes or questions to [facility] Clinic [phone].

## 2020-10-03 ENCOUNTER — Other Ambulatory Visit: Payer: Self-pay | Admitting: Cardiology

## 2020-10-03 DIAGNOSIS — I739 Peripheral vascular disease, unspecified: Secondary | ICD-10-CM

## 2020-10-07 ENCOUNTER — Ambulatory Visit (INDEPENDENT_AMBULATORY_CARE_PROVIDER_SITE_OTHER): Payer: Medicare Other | Admitting: *Deleted

## 2020-10-07 ENCOUNTER — Other Ambulatory Visit: Payer: Self-pay

## 2020-10-07 DIAGNOSIS — Z5181 Encounter for therapeutic drug level monitoring: Secondary | ICD-10-CM

## 2020-10-07 DIAGNOSIS — I4819 Other persistent atrial fibrillation: Secondary | ICD-10-CM

## 2020-10-07 LAB — POCT INR: INR: 2 (ref 2.0–3.0)

## 2020-10-07 NOTE — Patient Instructions (Signed)
Description   Continue taking Warfarin 1 tablet daily. Recheck INR in 4 weeks. Drinking 2 boost daily. Call us with any medication changes or questions to [facility] #[phone].      

## 2020-11-11 ENCOUNTER — Ambulatory Visit (INDEPENDENT_AMBULATORY_CARE_PROVIDER_SITE_OTHER): Payer: Medicare Other

## 2020-11-11 ENCOUNTER — Other Ambulatory Visit: Payer: Self-pay

## 2020-11-11 DIAGNOSIS — I4819 Other persistent atrial fibrillation: Secondary | ICD-10-CM

## 2020-11-11 DIAGNOSIS — Z5181 Encounter for therapeutic drug level monitoring: Secondary | ICD-10-CM | POA: Diagnosis not present

## 2020-11-11 LAB — POCT INR: INR: 2.7 (ref 2.0–3.0)

## 2020-11-11 NOTE — Patient Instructions (Signed)
Continue taking Warfarin 1 tablet daily. Recheck INR in 5 weeks. Drinking 2 boost daily. Call us with any medication changes or questions to [facility] [phone].

## 2020-12-13 DIAGNOSIS — I129 Hypertensive chronic kidney disease with stage 1 through stage 4 chronic kidney disease, or unspecified chronic kidney disease: Secondary | ICD-10-CM | POA: Diagnosis not present

## 2020-12-13 DIAGNOSIS — N400 Enlarged prostate without lower urinary tract symptoms: Secondary | ICD-10-CM | POA: Diagnosis not present

## 2020-12-13 DIAGNOSIS — N189 Chronic kidney disease, unspecified: Secondary | ICD-10-CM | POA: Diagnosis not present

## 2020-12-13 DIAGNOSIS — I77819 Aortic ectasia, unspecified site: Secondary | ICD-10-CM | POA: Diagnosis not present

## 2020-12-13 DIAGNOSIS — R972 Elevated prostate specific antigen [PSA]: Secondary | ICD-10-CM | POA: Diagnosis not present

## 2020-12-13 DIAGNOSIS — N2581 Secondary hyperparathyroidism of renal origin: Secondary | ICD-10-CM | POA: Diagnosis not present

## 2020-12-13 DIAGNOSIS — N183 Chronic kidney disease, stage 3 unspecified: Secondary | ICD-10-CM | POA: Diagnosis not present

## 2020-12-13 DIAGNOSIS — D631 Anemia in chronic kidney disease: Secondary | ICD-10-CM | POA: Diagnosis not present

## 2020-12-13 DIAGNOSIS — I4891 Unspecified atrial fibrillation: Secondary | ICD-10-CM | POA: Diagnosis not present

## 2020-12-23 ENCOUNTER — Other Ambulatory Visit: Payer: Self-pay

## 2020-12-23 ENCOUNTER — Ambulatory Visit (INDEPENDENT_AMBULATORY_CARE_PROVIDER_SITE_OTHER): Payer: Medicare Other

## 2020-12-23 DIAGNOSIS — Z5181 Encounter for therapeutic drug level monitoring: Secondary | ICD-10-CM | POA: Diagnosis not present

## 2020-12-23 LAB — POCT INR: INR: 1.8 — AB (ref 2.0–3.0)

## 2020-12-23 NOTE — Patient Instructions (Signed)
Description   Take 1.5 tablets tonight and then continue taking Warfarin 1 tablet daily. Recheck INR in 4 weeks. Drinking 2 boost daily. Call us with any medication changes or questions to [facility] Clinic [phone].

## 2020-12-24 DIAGNOSIS — E1169 Type 2 diabetes mellitus with other specified complication: Secondary | ICD-10-CM | POA: Diagnosis not present

## 2020-12-24 DIAGNOSIS — N1832 Chronic kidney disease, stage 3b: Secondary | ICD-10-CM | POA: Diagnosis not present

## 2020-12-24 DIAGNOSIS — M109 Gout, unspecified: Secondary | ICD-10-CM | POA: Diagnosis not present

## 2020-12-24 DIAGNOSIS — I10 Essential (primary) hypertension: Secondary | ICD-10-CM | POA: Diagnosis not present

## 2020-12-24 DIAGNOSIS — R269 Unspecified abnormalities of gait and mobility: Secondary | ICD-10-CM | POA: Diagnosis not present

## 2021-01-20 ENCOUNTER — Ambulatory Visit (INDEPENDENT_AMBULATORY_CARE_PROVIDER_SITE_OTHER): Payer: Medicare Other | Admitting: *Deleted

## 2021-01-20 ENCOUNTER — Other Ambulatory Visit: Payer: Self-pay

## 2021-01-20 DIAGNOSIS — Z5181 Encounter for therapeutic drug level monitoring: Secondary | ICD-10-CM

## 2021-01-20 LAB — POCT INR: INR: 3.4 — AB (ref 2.0–3.0)

## 2021-01-20 NOTE — Patient Instructions (Signed)
Description   Hold warfarin  and then continue taking Warfarin 1 tablet daily. Recheck INR in 3 weeks. Drinking 2 boost daily. Call us with any medication changes or questions to [facility] Clinic [phone].

## 2021-02-10 ENCOUNTER — Other Ambulatory Visit: Payer: Self-pay

## 2021-02-10 ENCOUNTER — Ambulatory Visit (INDEPENDENT_AMBULATORY_CARE_PROVIDER_SITE_OTHER): Payer: Medicare Other

## 2021-02-10 DIAGNOSIS — I4819 Other persistent atrial fibrillation: Secondary | ICD-10-CM | POA: Diagnosis not present

## 2021-02-10 DIAGNOSIS — Z5181 Encounter for therapeutic drug level monitoring: Secondary | ICD-10-CM

## 2021-02-10 DIAGNOSIS — Z23 Encounter for immunization: Secondary | ICD-10-CM | POA: Diagnosis not present

## 2021-02-10 LAB — POCT INR: INR: 2.6 (ref 2.0–3.0)

## 2021-02-10 NOTE — Patient Instructions (Signed)
-   continue taking Warfarin 1 tablet daily.  - Recheck INR in 4 weeks.   Drinking 2 boost daily. Call us with any medication changes or questions to [facility] Clinic [phone].

## 2021-03-07 ENCOUNTER — Ambulatory Visit: Payer: Medicare Other | Admitting: Cardiology

## 2021-03-10 DIAGNOSIS — U071 COVID-19: Secondary | ICD-10-CM | POA: Diagnosis not present

## 2021-03-14 ENCOUNTER — Other Ambulatory Visit: Payer: Self-pay

## 2021-03-14 ENCOUNTER — Ambulatory Visit (INDEPENDENT_AMBULATORY_CARE_PROVIDER_SITE_OTHER): Payer: Medicare Other

## 2021-03-14 DIAGNOSIS — Z5181 Encounter for therapeutic drug level monitoring: Secondary | ICD-10-CM

## 2021-03-14 DIAGNOSIS — I4819 Other persistent atrial fibrillation: Secondary | ICD-10-CM

## 2021-03-14 LAB — POCT INR: INR: 2.3 (ref 2.0–3.0)

## 2021-03-14 NOTE — Patient Instructions (Signed)
-   continue taking Warfarin 1 tablet daily.  - Recheck INR in 5 weeks.   Drinking 2 boost daily. Call us with any medication changes or questions to [facility] [phone].

## 2021-04-29 ENCOUNTER — Other Ambulatory Visit: Payer: Self-pay

## 2021-04-29 ENCOUNTER — Ambulatory Visit (INDEPENDENT_AMBULATORY_CARE_PROVIDER_SITE_OTHER): Payer: Medicare Other

## 2021-04-29 DIAGNOSIS — Z5181 Encounter for therapeutic drug level monitoring: Secondary | ICD-10-CM

## 2021-04-29 DIAGNOSIS — I4819 Other persistent atrial fibrillation: Secondary | ICD-10-CM | POA: Diagnosis not present

## 2021-04-29 LAB — POCT INR: INR: 2.6 (ref 2.0–3.0)

## 2021-04-29 NOTE — Patient Instructions (Signed)
Description   Continue taking Warfarin 1 tablet daily.  Recheck INR in 6 weeks. Drinking 2 boost daily. Call us with any medication changes or questions to [facility] #[phone].      

## 2021-06-04 ENCOUNTER — Ambulatory Visit: Payer: Medicare Other | Admitting: Physician Assistant

## 2021-06-09 ENCOUNTER — Other Ambulatory Visit: Payer: Self-pay | Admitting: Cardiology

## 2021-06-09 NOTE — Telephone Encounter (Signed)
Prescription refill request received for warfarin Lov: 09/05/20 Anne Fu)  Next INR check: 06/10/21 Warfarin tablet strength: 5mg   Appropriate dose and refill sent to requested pharmacy.

## 2021-06-10 ENCOUNTER — Other Ambulatory Visit: Payer: Self-pay

## 2021-06-10 ENCOUNTER — Ambulatory Visit (INDEPENDENT_AMBULATORY_CARE_PROVIDER_SITE_OTHER): Payer: PPO | Admitting: *Deleted

## 2021-06-10 DIAGNOSIS — I4819 Other persistent atrial fibrillation: Secondary | ICD-10-CM | POA: Diagnosis not present

## 2021-06-10 DIAGNOSIS — Z5181 Encounter for therapeutic drug level monitoring: Secondary | ICD-10-CM | POA: Diagnosis not present

## 2021-06-10 LAB — POCT INR: INR: 3.7 — AB (ref 2.0–3.0)

## 2021-06-10 NOTE — Patient Instructions (Signed)
Description   Do not take any Warfarin today then continue taking Warfarin 1 tablet daily.  Recheck INR in 4 weeks. Drinking 2 boost daily. Call us with any medication changes or questions to [facility] [phone].

## 2021-06-16 ENCOUNTER — Other Ambulatory Visit: Payer: Self-pay | Admitting: Cardiology

## 2021-06-16 DIAGNOSIS — Z7689 Persons encountering health services in other specified circumstances: Secondary | ICD-10-CM | POA: Diagnosis not present

## 2021-06-16 DIAGNOSIS — N400 Enlarged prostate without lower urinary tract symptoms: Secondary | ICD-10-CM | POA: Diagnosis not present

## 2021-06-16 DIAGNOSIS — R972 Elevated prostate specific antigen [PSA]: Secondary | ICD-10-CM | POA: Diagnosis not present

## 2021-06-16 DIAGNOSIS — N2581 Secondary hyperparathyroidism of renal origin: Secondary | ICD-10-CM | POA: Diagnosis not present

## 2021-06-16 DIAGNOSIS — D631 Anemia in chronic kidney disease: Secondary | ICD-10-CM | POA: Diagnosis not present

## 2021-06-16 DIAGNOSIS — I4891 Unspecified atrial fibrillation: Secondary | ICD-10-CM | POA: Diagnosis not present

## 2021-06-16 DIAGNOSIS — I129 Hypertensive chronic kidney disease with stage 1 through stage 4 chronic kidney disease, or unspecified chronic kidney disease: Secondary | ICD-10-CM | POA: Diagnosis not present

## 2021-06-16 DIAGNOSIS — N183 Chronic kidney disease, stage 3 unspecified: Secondary | ICD-10-CM | POA: Diagnosis not present

## 2021-06-16 DIAGNOSIS — N189 Chronic kidney disease, unspecified: Secondary | ICD-10-CM | POA: Diagnosis not present

## 2021-06-16 DIAGNOSIS — I77819 Aortic ectasia, unspecified site: Secondary | ICD-10-CM | POA: Diagnosis not present

## 2021-06-17 ENCOUNTER — Other Ambulatory Visit: Payer: Self-pay

## 2021-06-17 ENCOUNTER — Ambulatory Visit: Payer: PPO | Admitting: Cardiology

## 2021-06-17 ENCOUNTER — Encounter: Payer: Self-pay | Admitting: Cardiology

## 2021-06-17 DIAGNOSIS — I4891 Unspecified atrial fibrillation: Secondary | ICD-10-CM

## 2021-06-17 DIAGNOSIS — I77810 Thoracic aortic ectasia: Secondary | ICD-10-CM | POA: Diagnosis not present

## 2021-06-17 DIAGNOSIS — I10 Essential (primary) hypertension: Secondary | ICD-10-CM | POA: Diagnosis not present

## 2021-06-17 DIAGNOSIS — N1831 Chronic kidney disease, stage 3a: Secondary | ICD-10-CM

## 2021-06-17 DIAGNOSIS — I452 Bifascicular block: Secondary | ICD-10-CM

## 2021-06-17 DIAGNOSIS — I4819 Other persistent atrial fibrillation: Secondary | ICD-10-CM | POA: Diagnosis not present

## 2021-06-17 DIAGNOSIS — D6869 Other thrombophilia: Secondary | ICD-10-CM

## 2021-06-17 DIAGNOSIS — I5022 Chronic systolic (congestive) heart failure: Secondary | ICD-10-CM

## 2021-06-17 NOTE — Assessment & Plan Note (Signed)
Continuing to be managed by nephrology.  Overall last creatinine 1.5.

## 2021-06-17 NOTE — Assessment & Plan Note (Signed)
Doing well rate controlled.  Prior EKG showed heart rate of 60.  He is on metoprolol 200 mg twice a day.  Continue.  No changes.  No syncope.

## 2021-06-17 NOTE — Assessment & Plan Note (Signed)
Little bit elevated today but at home he is usually in the 120s and 130s.  He has clonidine 0.2 mg twice a day.  Metoprolol 200 mg twice a day.

## 2021-06-17 NOTE — Assessment & Plan Note (Addendum)
Continuing with warfarin.  Closely monitoring.  He has been on this for years.  No bleeding.  Prior INR 2.6.  Hemoglobin 14.2.  High risk medication management.

## 2021-06-17 NOTE — Assessment & Plan Note (Signed)
Echo 2021-EF 45%.  Unchanged from prior.  He is not on ACE inhibitor because of kidney disease.  Continue with metoprolol.  Well compensated.

## 2021-06-17 NOTE — Progress Notes (Signed)
Cardiology Office Note:    Date:  06/17/2021   ID:  Ralph Sullivan, DOB [birth year], MRN [id number]  PCP:  Ralph Cha, MD   [facility] Providers Cardiologist:  Ralph Furbish, MD     Referring MD: Ralph Sullivan,*   History of Present Illness:    Ralph Sullivan is a 76 y.o. male here for follow-up of chronic systolic heart failure EF improvement from 25 up to 60% with permanent atrial fibrillation right bundle branch block left anterior fascicular block.  Left knee pain. Holding off surgery  Dr. Justin Sullivan. K normal   Today, he is doing well.  No chest pain no fevers no chills.  Continues taking his medications without difficulty.  He denies any palpitations, chest pain, or shortness of breath, lightheadedness, headaches, syncope, orthopnea, PND, lower extremity edema or exertional symptoms.  Past Medical History:  Diagnosis Date   Atrial fibrillation ([place]) 02/08/2013   CHADS-VAS - 2 (AGE, HTN). On anticoagulation. Holter 6/14 - 2.3 sec pause. Avg HR 87. 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    Past Surgical History:  Procedure Laterality Date   CATARACT EXTRACTION     HERNIA REPAIR     INGUINAL HERNIA REPAIR  07/16/2016   INGUINAL HERNIA REPAIR Left 07/16/2016   Procedure: LAPAROSCOPIC INGUINAL HERNIA REPAIR;  Surgeon: Ralph Skinner, MD;  Location: [place];  Service: General;  Laterality: Left;   INSERTION OF MESH Left 07/16/2016   Procedure: INSERTION OF MESH;  Surgeon: Ralph Bruce Kinsinger, MD;  Location: [place];  Service: General;  Laterality: Left;   KIDNEY STONE  SURGERY      Current Medications: Current Meds  Medication Sig   allopurinol (ZYLOPRIM) 300 MG tablet Take 300 mg by mouth daily.    cloNIDine (CATAPRES) 0.2 MG tablet Take 1 tablet (0.2 mg total) by mouth 2 (two) times daily.   ferrous sulfate 325 (65 FE) MG tablet Take 325 mg by mouth daily with breakfast.   metoprolol (LOPRESSOR) 100 MG tablet Take 200 mg by mouth 2 (two) times daily.    Omega-3 Fatty Acids (OMEGA 3 PO) Take 1 capsule by mouth 2 (two) times daily.    vitamin E 400 UNIT capsule Take 400 Units by mouth daily.   warfarin (COUMADIN) 5 MG tablet TAKE 1 TABLET BY MOUTH ONCE DAILY OR  AS  DIRECTED  BY  COUMADIN  CLINIC     Allergies:   Allopurinol, Colchicine, Duloxetine, Elemental sulfur, and Oxycontin [oxycodone hcl]   Social History   Socioeconomic History   Marital status: Married    Spouse name: Not on file   Number of children: Not on file   Years of education: Not on file   Highest education level: Not on file  Occupational History   Not on file  Tobacco Use   Smoking status: Former    Types: Cigarettes    Quit date: 03/1970    Years since quitting: 51.2   Smokeless tobacco: Never  Vaping Use   Vaping Use: Never used  Substance and Sexual Activity   Alcohol use: No  Alcohol/week: 0.0 standard drinks   Drug use: No   Sexual activity: Not on file  Other Topics Concern   Not on file  Social History Narrative   Not on file   Social Determinants of Health   Financial Resource Strain: Not on file  Food Insecurity: Not on file  Transportation Needs: Not on file  Physical Activity: Not on file  Stress: Not on file  Social Connections: Not on file     Family History: The patient's family history includes Diabetes in his mother; Hypertension in his mother.  ROS:   Please see the history of present illness.     All other systems reviewed and are negative.  EKGs/Labs/Other Studies Reviewed:    Echo 03/18/20  1. Left ventricular ejection  fraction, by estimation, is 45 to 50%. The  left ventricle has mildly decreased function. Left ventricular endocardial border not optimally defined to evaluate regional wall motion. There is moderate left ventricular hypertrophy. Left ventricular diastolic parameters are indeterminate.   2. Right ventricular systolic function is normal. The right ventricular  size is mildly enlarged.   3. Left atrial size was mildly dilated.   4. Right atrial size was mildly dilated.   5. The mitral valve is normal in structure. No evidence of mitral valve  regurgitation. No evidence of mitral stenosis.   6. The aortic valve is tricuspid. There is mild calcification of the  aortic valve. There is mild thickening of the aortic valve. Aortic valve  regurgitation is not visualized. Mild aortic valve sclerosis is present,  with no evidence of aortic valve stenosis.   7. Aortic dilatation noted. There is mild dilatation at the level of the  sinuses of Valsalva, measuring 42 mm. There is mild dilatation of the  ascending aorta, measuring 43 mm.   8. The inferior vena cava is dilated in size with <50% respiratory  variability, suggesting right atrial pressure of 15 mmHg.   Lexiscan 10/09/19 Nuclear stress EF: 41%. There was no ST segment deviation noted during stress. No T wave inversion was noted during stress. Findings consistent with prior myocardial infarction. This is an intermediate risk study. The left ventricular ejection fraction is moderately decreased (30-44%).   1. There is a medium size (10-15% of LV), fixed, moderate to severe perfusion defect present in the basal to apical inferior segments and apex consistent with prior infarction. The wall motion in this region is hypokinetic, favoring prior infarction versus diaphragm attenuation.  2. No ischemia.  3. Moderately reduced LVEF, 41%.  4. This is an intermediate risk study.   Lower Extremity Doppler 07/24/19 Right: Resting right ankle-brachial  index indicates noncompressible right lower extremity arteries. The right toe-brachial index is normal.   Left: Resting left ankle-brachial index indicates noncompressible left  lower extremity arteries. The left toe-brachial index is normal.   EKG:  EKG was personally reviewed  09/05/20: atrial fibrillation 60 bpm with right bundle branch block left anterior fascicular block  Recent Labs: No results found for requested labs within last 8760 hours.  Recent Lipid Panel No results found for: CHOL, TRIG, HDL, CHOLHDL, VLDL, LDLCALC, LDLDIRECT   Risk Assessment/Calculations:      Physical Exam:    VS:  BP (!) 148/83 (BP Location: Left Arm, Patient Position: Sitting, Cuff Size: Normal)    Pulse 67    Ht 6' (1.829 m)    Wt 187 lb (84.8 kg)    SpO2 93%    BMI 25.36 kg/m  Wt Readings from Last 3 Encounters:  06/17/21 187 lb (84.8 kg)  09/05/20 199 lb 3.2 oz (90.4 kg)  02/05/20 200 lb (90.7 kg)     GEN:  Well nourished, well developed in no acute distress HEENT: Normal NECK: No JVD; No carotid bruits LYMPHATICS: No lymphadenopathy CARDIAC: irreg, no murmurs, rubs, gallops RESPIRATORY:  Clear to auscultation without rales, wheezing or rhonchi  ABDOMEN: Soft, non-tender, non-distended MUSCULOSKELETAL:  No edema; No deformity  SKIN: Warm and dry NEUROLOGIC:  Alert and oriented x 3 PSYCHIATRIC:  Normal affect   ASSESSMENT:    1. Dilated aortic root (HCC)   2. Persistent atrial fibrillation ([place])   3. Bifascicular block   4. Stage 3a chronic kidney disease ([place])   5. Primary hypertension   6. Hypercoagulable state due to atrial fibrillation ([place])   7. Chronic systolic heart failure (HCC)     PLAN:    Dilated aortic root (HCC) Stable on echocardiogram.  No changes.  4 to 4.5 cm sinus of Valsalva with 4.2 cm sinotubular junction.  Persistent atrial fibrillation (HCC) Doing well rate controlled.  Prior EKG showed heart rate of 60.  He is on metoprolol 200 mg twice a day.   Continue.  No changes.  No syncope.  Bifascicular block Carefully monitoring with AV nodal blocking agent for atrial fibrillation.  Chronic kidney disease, stage III (moderate) (HCC) Continuing to be managed by nephrology.  Overall last creatinine 1.5.  HTN (hypertension) Little bit elevated today but at home he is usually in the 120s and 130s.  He has clonidine 0.2 mg twice a day.  Metoprolol 200 mg twice a day.  Hypercoagulable state due to atrial fibrillation ([place]) Continuing with warfarin.  Closely monitoring.  He has been on this for years.  No bleeding.  Prior INR 2.6.  Hemoglobin 14.2.  High risk medication management.  Chronic systolic heart failure (HCC) Echo 2021-EF 45%.  Unchanged from prior.  He is not on ACE inhibitor because of kidney disease.  Continue with metoprolol.  Well compensated.  Medication Adjustments/Labs and Tests Ordered: Current medicines are reviewed at length with the patient today.  Concerns regarding medicines are outlined above.  No orders of the defined types were placed in this encounter.  No orders of the defined types were placed in this encounter.   Patient Instructions  Medication Instructions:  The current medical regimen is effective;  continue present plan and medications.  *If you need a refill on your cardiac medications before your next appointment, please call your pharmacy*  Follow-Up: At [facility], you and your health needs are our priority.  As part of our continuing mission to provide you with exceptional heart care, we have created designated Provider Care Teams.  These Care Teams include your primary Cardiologist (physician) and Advanced Practice Providers (APPs -  Physician Assistants and Nurse Practitioners) who all work together to provide you with the care you need, when you need it.  We recommend signing up for the patient portal called "MyChart".  Sign up information is provided on this After Visit Summary.  MyChart is  used to connect with patients for Virtual Visits (Telemedicine).  Patients are able to view lab/test results, encounter notes, upcoming appointments, etc.  Non-urgent messages can be sent to your provider as well.   To learn more about what you can do with MyChart, go to [email or web address].    Your next appointment:   6 month(s)  The format for your next appointment:   In  Person  Provider:   Candee Furbish, MD {   Thank you for choosing [place]!!      Wilhemina Bonito as a scribe for Ralph Furbish, MD.,have documented all relevant documentation on the behalf of Ralph Furbish, MD,as directed by  Ralph Furbish, MD while in the presence of Ralph Furbish, MD.  I, Ralph Furbish, MD, have reviewed all documentation for this visit. The documentation on 06/17/21 for the exam, diagnosis, procedures, and orders are all accurate and complete.   Signed, Ralph Furbish, MD  06/17/2021 5:04 PM    [place]

## 2021-06-17 NOTE — Patient Instructions (Signed)

## 2021-06-17 NOTE — Assessment & Plan Note (Signed)
Carefully monitoring with AV nodal blocking agent for atrial fibrillation.

## 2021-06-17 NOTE — Assessment & Plan Note (Signed)
Stable on echocardiogram.  No changes.  4 to 4.5 cm sinus of Valsalva with 4.2 cm sinotubular junction.

## 2021-06-17 NOTE — Progress Notes (Signed)
Cardiology Office Note:    Date:  06/17/2021   ID:  Ralph Sullivan, DOB Feb 27, 1946, MRN [id number]  PCP:  Leeroy Cha, MD   [facility] Providers Cardiologist:  Candee Furbish, MD     Referring MD: Leeroy Cha,*   History of Present Illness:    Ralph Sullivan is a 76 y.o. male here for follow-up of chronic systolic heart failure EF improvement from 25 up to 60% with permanent atrial fibrillation right bundle branch block left anterior fascicular block.  Left knee pain. Holding off surgery  Dr. Justin Mend. K normal   Today, he is doing well. His kidney function is improving and he follows up with his primary tomorrow.  At home, his blood pressure stays on average 120/60 and his heart rate ranges from 50 to 69 bpm.   He hopes to undergo knee replacement in the future, especially for his L knee. He uses a walker to help with balance. Despite the knee pain, he is able to go shopping with friends for several hours.   He denies any palpitations, chest pain, or shortness of breath, lightheadedness, headaches, syncope, orthopnea, PND, lower extremity edema or exertional symptoms.  Past Medical History:  Diagnosis Date   Atrial fibrillation ([place]) 02/08/2013   CHADS-VAS - 2 (AGE, HTN). On anticoagulation. Holter 6/14 - 2.3 sec pause. Avg HR 87. 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    Past Surgical History:  Procedure Laterality Date   CATARACT EXTRACTION     HERNIA REPAIR     INGUINAL HERNIA REPAIR  07/16/2016   INGUINAL HERNIA REPAIR Left 07/16/2016   Procedure: LAPAROSCOPIC  INGUINAL HERNIA REPAIR;  Surgeon: Mickeal Skinner, MD;  Location: [place];  Service: General;  Laterality: Left;   INSERTION OF MESH Left 07/16/2016   Procedure: INSERTION OF MESH;  Surgeon: Arta Bruce Kinsinger, MD;  Location: [place];  Service: General;  Laterality: Left;   KIDNEY STONE SURGERY      Current Medications: Current Meds  Medication Sig   allopurinol (ZYLOPRIM) 300 MG tablet Take 300 mg by mouth daily.    cloNIDine (CATAPRES) 0.2 MG tablet Take 1 tablet (0.2 mg total) by mouth 2 (two) times daily.   ferrous sulfate 325 (65 FE) MG tablet Take 325 mg by mouth daily with breakfast.   metoprolol (LOPRESSOR) 100 MG tablet Take 200 mg by mouth 2 (two) times daily.    Omega-3 Fatty Acids (OMEGA 3 PO) Take 1 capsule by mouth 2 (two) times daily.    vitamin E 400 UNIT capsule Take 400 Units by mouth daily.   warfarin (COUMADIN) 5 MG tablet TAKE 1 TABLET BY MOUTH ONCE DAILY OR  AS  DIRECTED  BY  COUMADIN  CLINIC     Allergies:   Allopurinol, Colchicine, Duloxetine, Elemental sulfur, and Oxycontin [oxycodone hcl]   Social History   Socioeconomic History   Marital status: Married    Spouse name: Not on file   Number of children: Not on file   Years of education: Not on file   Highest education level: Not on file  Occupational History   Not on file  Tobacco Use   Smoking status: Former    Types: Cigarettes    Quit date: 03/1970    Years since quitting: 51.2   Smokeless tobacco: Never  Vaping Use   Vaping Use: Never used  Substance and Sexual Activity   Alcohol use: No    Alcohol/week: 0.0 standard drinks   Drug use: No   Sexual activity: Not on file  Other Topics Concern   Not on file  Social History Narrative   Not on file   Social Determinants of Health   Financial Resource Strain: Not on file  Food Insecurity: Not on file  Transportation Needs: Not on file  Physical Activity: Not on file  Stress: Not on file  Social Connections: Not on file     Family  History: The patient's family history includes Diabetes in his mother; Hypertension in his mother.  ROS:   Please see the history of present illness.    (+) L knee pain  All other systems reviewed and are negative.  EKGs/Labs/Other Studies Reviewed:    Echo 03/18/20  1. Left ventricular ejection fraction, by estimation, is 45 to 50%. The  left ventricle has mildly decreased function. Left ventricular endocardial border not optimally defined to evaluate regional wall motion. There is moderate left ventricular hypertrophy. Left ventricular diastolic parameters are indeterminate.   2. Right ventricular systolic function is normal. The right ventricular  size is mildly enlarged.   3. Left atrial size was mildly dilated.   4. Right atrial size was mildly dilated.   5. The mitral valve is normal in structure. No evidence of mitral valve  regurgitation. No evidence of mitral stenosis.   6. The aortic valve is tricuspid. There is mild calcification of the  aortic valve. There is mild thickening of the aortic valve. Aortic valve  regurgitation is not visualized. Mild aortic valve sclerosis is present,  with no evidence of aortic valve stenosis.   7. Aortic dilatation noted. There is mild dilatation at the level of the  sinuses of Valsalva, measuring 42 mm. There is mild dilatation of the  ascending aorta, measuring 43 mm.   8. The inferior vena cava is dilated in size with <50% respiratory  variability, suggesting right atrial pressure of 15 mmHg.   Lexiscan 10/09/19 Nuclear stress EF: 41%. There was no ST segment deviation noted during stress. No T wave inversion was noted during stress. Findings consistent with prior myocardial infarction. This is an intermediate risk study. The left ventricular ejection fraction is moderately decreased (30-44%).   1. There is a medium size (10-15% of LV), fixed, moderate to severe perfusion defect present in the basal to apical inferior segments and  apex consistent with prior infarction. The wall motion in this region is hypokinetic, favoring prior infarction versus diaphragm attenuation.  2. No ischemia.  3. Moderately reduced LVEF, 41%.  4. This is an intermediate risk study.   Lower Extremity Doppler 07/24/19 Right: Resting right ankle-brachial index indicates noncompressible right lower extremity arteries. The right toe-brachial index is normal.   Left: Resting left ankle-brachial index indicates noncompressible left  lower extremity arteries. The left toe-brachial index is normal.   EKG:  EKG was not ordered today 09/05/20: atrial fibrillation 60 bpm with right bundle branch block left anterior fascicular block  Recent Labs: No results found for requested labs within last 8760 hours.  Recent Lipid Panel No results found for: CHOL, TRIG, HDL, CHOLHDL, VLDL, LDLCALC, LDLDIRECT  Risk Assessment/Calculations:      Physical Exam:    VS:  BP (!) 148/83 (BP Location: Left Arm, Patient Position: Sitting, Cuff Size: Normal)    Pulse 67    Ht 6' (1.829 m)    Wt 187 lb (84.8 kg)    SpO2 93%    BMI 25.36 kg/m     Wt Readings from Last 3 Encounters:  06/17/21 187 lb (84.8 kg)  09/05/20 199 lb 3.2 oz (90.4 kg)  02/05/20 200 lb (90.7 kg)     GEN:  Well nourished, well developed in no acute distress HEENT: Normal NECK: No JVD; No carotid bruits LYMPHATICS: No lymphadenopathy CARDIAC: RRR with minimal ectopy, no murmurs, rubs, gallops RESPIRATORY:  Clear to auscultation without rales, wheezing or rhonchi  ABDOMEN: Soft, non-tender, non-distended MUSCULOSKELETAL:  No edema; No deformity  SKIN: Warm and dry NEUROLOGIC:  Alert and oriented x 3 PSYCHIATRIC:  Normal affect   ASSESSMENT:    1. Dilated aortic root (HCC)   2. Persistent atrial fibrillation ([place])   3. Bifascicular block   4. Stage 3a chronic kidney disease ([place])   5. Primary hypertension   6. Hypercoagulable state due to atrial fibrillation ([place])   7. Chronic  systolic heart failure (HCC)     PLAN:    Dilated aortic root (HCC) Stable on echocardiogram.  No changes.  4 to 4.5 cm sinus of Valsalva with 4.2 cm sinotubular junction.  Persistent atrial fibrillation (HCC) Doing well rate controlled.  Prior EKG showed heart rate of 60.  He is on metoprolol 200 mg twice a day.  Continue.  No changes.  No syncope.  Bifascicular block Carefully monitoring with AV nodal blocking agent for atrial fibrillation.  Chronic kidney disease, stage III (moderate) (HCC) Continuing to be managed by nephrology.  Overall last creatinine 1.5.  HTN (hypertension) Little bit elevated today but at home he is usually in the 120s and 130s.  He has clonidine 0.2 mg twice a day.  Metoprolol 200 mg twice a day.  Hypercoagulable state due to atrial fibrillation ([place]) Continuing with warfarin.  Closely monitoring.  He has been on this for years.  No bleeding.  Prior INR 2.6.  Hemoglobin 14.2.  High risk medication management.  Chronic systolic heart failure (HCC) Echo 2021-EF 45%.  Unchanged from prior.  He is not on ACE inhibitor because of kidney disease.  Continue with metoprolol.  Well compensated.      Medication Adjustments/Labs and Tests Ordered: Current medicines are reviewed at length with the patient today.  Concerns regarding medicines are outlined above.  No orders of the defined types were placed in this encounter.  No orders of the defined types were placed in this encounter.   Patient Instructions  Medication Instructions:  The current medical regimen is effective;  continue present plan and medications.  *If you need a refill on your cardiac medications before your next appointment, please call your pharmacy*  Follow-Up: At [facility], you and your health needs are our priority.  As part of our continuing mission to provide you with exceptional heart care, we have created designated Provider Care Teams.  These Care Teams include your primary  Cardiologist (physician) and Advanced Practice Providers (APPs -  Physician Assistants and Nurse Practitioners) who all work together to provide you with the care you need, when you need it.  We recommend signing up for the patient portal called "MyChart".  Sign up information is provided on this After Visit Summary.  MyChart  is used to connect with patients for Virtual Visits (Telemedicine).  Patients are able to view lab/test results, encounter notes, upcoming appointments, etc.  Non-urgent messages can be sent to your provider as well.   To learn more about what you can do with MyChart, go to [email or web address].    Your next appointment:   6 month(s)  The format for your next appointment:   In Person  Provider:   Candee Furbish, MD {   Thank you for choosing [place]!!      Court Joy Javier,acting as a scribe for Candee Furbish, MD.,have documented all relevant documentation on the behalf of Candee Furbish, MD,as directed by  Candee Furbish, MD while in the presence of Candee Furbish, MD.  I, Candee Furbish, MD, have reviewed all documentation for this visit. The documentation on 06/17/21 for the exam, diagnosis, procedures, and orders are all accurate and complete.   Signed, Candee Furbish, MD  06/17/2021 5:00 PM    [place]

## 2021-06-18 DIAGNOSIS — I482 Chronic atrial fibrillation, unspecified: Secondary | ICD-10-CM | POA: Diagnosis not present

## 2021-06-18 DIAGNOSIS — I129 Hypertensive chronic kidney disease with stage 1 through stage 4 chronic kidney disease, or unspecified chronic kidney disease: Secondary | ICD-10-CM | POA: Diagnosis not present

## 2021-06-18 DIAGNOSIS — I6930 Unspecified sequelae of cerebral infarction: Secondary | ICD-10-CM | POA: Diagnosis not present

## 2021-06-18 DIAGNOSIS — M170 Bilateral primary osteoarthritis of knee: Secondary | ICD-10-CM | POA: Diagnosis not present

## 2021-06-18 DIAGNOSIS — M109 Gout, unspecified: Secondary | ICD-10-CM | POA: Diagnosis not present

## 2021-06-18 DIAGNOSIS — E785 Hyperlipidemia, unspecified: Secondary | ICD-10-CM | POA: Diagnosis not present

## 2021-06-18 DIAGNOSIS — N1832 Chronic kidney disease, stage 3b: Secondary | ICD-10-CM | POA: Diagnosis not present

## 2021-06-18 DIAGNOSIS — Z1389 Encounter for screening for other disorder: Secondary | ICD-10-CM | POA: Diagnosis not present

## 2021-06-18 DIAGNOSIS — E1169 Type 2 diabetes mellitus with other specified complication: Secondary | ICD-10-CM | POA: Diagnosis not present

## 2021-06-18 DIAGNOSIS — F411 Generalized anxiety disorder: Secondary | ICD-10-CM | POA: Diagnosis not present

## 2021-06-18 DIAGNOSIS — G894 Chronic pain syndrome: Secondary | ICD-10-CM | POA: Diagnosis not present

## 2021-06-18 DIAGNOSIS — Z0000 Encounter for general adult medical examination without abnormal findings: Secondary | ICD-10-CM | POA: Diagnosis not present

## 2021-06-23 DIAGNOSIS — E1169 Type 2 diabetes mellitus with other specified complication: Secondary | ICD-10-CM | POA: Diagnosis not present

## 2021-07-08 ENCOUNTER — Ambulatory Visit (INDEPENDENT_AMBULATORY_CARE_PROVIDER_SITE_OTHER): Payer: PPO

## 2021-07-08 ENCOUNTER — Other Ambulatory Visit: Payer: Self-pay

## 2021-07-08 DIAGNOSIS — I4819 Other persistent atrial fibrillation: Secondary | ICD-10-CM

## 2021-07-08 DIAGNOSIS — Z5181 Encounter for therapeutic drug level monitoring: Secondary | ICD-10-CM

## 2021-07-08 LAB — POCT INR: INR: 1.8 — AB (ref 2.0–3.0)

## 2021-07-08 NOTE — Patient Instructions (Signed)
Description   ?Take 1.5 tablets today, then resume same dosage of Warfarin 1 tablet daily.  Recheck INR in 4 weeks. Drinking 2 boost daily. Call us with any medication changes or questions to [facility] [phone].  ?  ?  ?

## 2021-08-05 ENCOUNTER — Ambulatory Visit (INDEPENDENT_AMBULATORY_CARE_PROVIDER_SITE_OTHER): Payer: PPO

## 2021-08-05 DIAGNOSIS — Z5181 Encounter for therapeutic drug level monitoring: Secondary | ICD-10-CM

## 2021-08-05 DIAGNOSIS — I4819 Other persistent atrial fibrillation: Secondary | ICD-10-CM | POA: Diagnosis not present

## 2021-08-05 LAB — POCT INR: INR: 2.5 (ref 2.0–3.0)

## 2021-08-05 NOTE — Patient Instructions (Signed)
Description   Continue on same dosage of Warfarin 1 tablet daily.  Recheck INR in 4 weeks. Drinking 2 boost daily. Call us with any medication changes or questions to [facility] #[phone].      

## 2021-09-02 ENCOUNTER — Ambulatory Visit (INDEPENDENT_AMBULATORY_CARE_PROVIDER_SITE_OTHER): Payer: PPO | Admitting: Pharmacist

## 2021-09-02 DIAGNOSIS — Z5181 Encounter for therapeutic drug level monitoring: Secondary | ICD-10-CM | POA: Diagnosis not present

## 2021-09-02 DIAGNOSIS — I4819 Other persistent atrial fibrillation: Secondary | ICD-10-CM | POA: Diagnosis not present

## 2021-09-02 LAB — POCT INR: INR: 1.8 — AB (ref 2.0–3.0)

## 2021-09-02 NOTE — Patient Instructions (Signed)
Take 1.5 tablets tonight then continue on same dosage of Warfarin 1 tablet daily.  Recheck INR in 3 weeks. Drinking 2 boost daily. Call us with any medication changes or questions to Coumadin Clinic [phone].  ?

## 2021-09-17 ENCOUNTER — Other Ambulatory Visit: Payer: Self-pay | Admitting: Cardiology

## 2021-09-17 DIAGNOSIS — I739 Peripheral vascular disease, unspecified: Secondary | ICD-10-CM

## 2021-09-23 ENCOUNTER — Ambulatory Visit (INDEPENDENT_AMBULATORY_CARE_PROVIDER_SITE_OTHER): Payer: PPO | Admitting: *Deleted

## 2021-09-23 DIAGNOSIS — Z5181 Encounter for therapeutic drug level monitoring: Secondary | ICD-10-CM

## 2021-09-23 LAB — POCT INR: INR: 2.5 (ref 2.0–3.0)

## 2021-09-23 NOTE — Patient Instructions (Signed)
Description   Continue on same dosage of Warfarin 1 tablet daily.  Recheck INR in 4 weeks. Drinking 2 boost daily. Call us with any medication changes or questions to Coumadin Clinic [phone].

## 2021-10-13 DIAGNOSIS — H26493 Other secondary cataract, bilateral: Secondary | ICD-10-CM | POA: Diagnosis not present

## 2021-10-21 ENCOUNTER — Ambulatory Visit (INDEPENDENT_AMBULATORY_CARE_PROVIDER_SITE_OTHER): Payer: PPO | Admitting: *Deleted

## 2021-10-21 DIAGNOSIS — Z5181 Encounter for therapeutic drug level monitoring: Secondary | ICD-10-CM | POA: Diagnosis not present

## 2021-10-21 DIAGNOSIS — I4819 Other persistent atrial fibrillation: Secondary | ICD-10-CM

## 2021-10-21 LAB — POCT INR: INR: 2.3 (ref 2.0–3.0)

## 2021-11-25 ENCOUNTER — Ambulatory Visit (INDEPENDENT_AMBULATORY_CARE_PROVIDER_SITE_OTHER): Payer: PPO | Admitting: *Deleted

## 2021-11-25 DIAGNOSIS — I4819 Other persistent atrial fibrillation: Secondary | ICD-10-CM

## 2021-11-25 DIAGNOSIS — Z5181 Encounter for therapeutic drug level monitoring: Secondary | ICD-10-CM | POA: Diagnosis not present

## 2021-11-25 LAB — POCT INR: INR: 2.2 (ref 2.0–3.0)

## 2021-11-25 NOTE — Patient Instructions (Signed)
Description   Continue taking Warfarin 1 tablet daily.  Recheck INR in 6 weeks. Drinking 2 boost daily. Call us with any medication changes or questions to Coumadin Clinic [phone].

## 2021-12-17 DIAGNOSIS — I129 Hypertensive chronic kidney disease with stage 1 through stage 4 chronic kidney disease, or unspecified chronic kidney disease: Secondary | ICD-10-CM | POA: Diagnosis not present

## 2021-12-17 DIAGNOSIS — M109 Gout, unspecified: Secondary | ICD-10-CM | POA: Diagnosis not present

## 2021-12-17 DIAGNOSIS — D6859 Other primary thrombophilia: Secondary | ICD-10-CM | POA: Diagnosis not present

## 2021-12-17 DIAGNOSIS — R634 Abnormal weight loss: Secondary | ICD-10-CM | POA: Diagnosis not present

## 2021-12-17 DIAGNOSIS — I10 Essential (primary) hypertension: Secondary | ICD-10-CM | POA: Diagnosis not present

## 2021-12-17 DIAGNOSIS — I5022 Chronic systolic (congestive) heart failure: Secondary | ICD-10-CM | POA: Diagnosis not present

## 2021-12-17 DIAGNOSIS — E1169 Type 2 diabetes mellitus with other specified complication: Secondary | ICD-10-CM | POA: Diagnosis not present

## 2021-12-17 DIAGNOSIS — Z23 Encounter for immunization: Secondary | ICD-10-CM | POA: Diagnosis not present

## 2021-12-17 DIAGNOSIS — N1832 Chronic kidney disease, stage 3b: Secondary | ICD-10-CM | POA: Diagnosis not present

## 2021-12-18 DIAGNOSIS — N2581 Secondary hyperparathyroidism of renal origin: Secondary | ICD-10-CM | POA: Diagnosis not present

## 2021-12-18 DIAGNOSIS — N183 Chronic kidney disease, stage 3 unspecified: Secondary | ICD-10-CM | POA: Diagnosis not present

## 2021-12-18 DIAGNOSIS — D631 Anemia in chronic kidney disease: Secondary | ICD-10-CM | POA: Diagnosis not present

## 2021-12-18 DIAGNOSIS — I129 Hypertensive chronic kidney disease with stage 1 through stage 4 chronic kidney disease, or unspecified chronic kidney disease: Secondary | ICD-10-CM | POA: Diagnosis not present

## 2021-12-18 DIAGNOSIS — I4891 Unspecified atrial fibrillation: Secondary | ICD-10-CM | POA: Diagnosis not present

## 2021-12-23 ENCOUNTER — Ambulatory Visit: Payer: PPO | Admitting: Cardiology

## 2021-12-23 NOTE — Progress Notes (Incomplete)
Cardiology Office Note:    Date:  12/23/2021   ID:  [name], DOB 10-05-1945, MRN [id number]  PCP:  Leeroy Cha, MD   [facility] Providers Cardiologist:  Candee Furbish, MD     Referring MD: Leeroy Cha,*   History of Present Illness:    Ralph Sullivan is a 76 y.o. male here for follow-up of chronic systolic heart failure EF improvement from 25 up to 60% with permanent atrial fibrillation right bundle branch block left anterior fascicular block.  Left knee pain. Holding off surgery  Dr. Justin Mend. K normal   Today, he is doing well. His kidney function is improving and he follows up with his primary tomorrow.  At home, his blood pressure stays on average 120/60 and his heart rate ranges from 50 to 69 bpm.   He hopes to undergo knee replacement in the future, especially for his L knee. He uses a walker to help with balance. Despite the knee pain, he is able to go shopping with friends for several hours.   He denies any palpitations, chest pain, or shortness of breath, lightheadedness, headaches, syncope, orthopnea, PND, lower extremity edema or exertional symptoms.  Past Medical History:  Diagnosis Date   Atrial fibrillation ([place]) 02/08/2013   CHADS-VAS - 2 (AGE, HTN). On anticoagulation. Holter 6/14 - 2.3 sec pause. Avg HR 87. 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    Past Surgical History:  Procedure Laterality Date   CATARACT EXTRACTION     HERNIA REPAIR     INGUINAL HERNIA REPAIR  07/16/2016   INGUINAL HERNIA REPAIR Left 07/16/2016   Procedure: LAPAROSCOPIC  INGUINAL HERNIA REPAIR;  Surgeon: Mickeal Skinner, MD;  Location: [place];  Service: General;  Laterality: Left;   INSERTION OF MESH Left 07/16/2016   Procedure: INSERTION OF MESH;  Surgeon: Mickeal Skinner, MD;  Location: [place];  Service: General;  Laterality: Left;   KIDNEY STONE SURGERY      Current Medications: No outpatient medications have been marked as taking for the 12/23/21 encounter (Appointment) with Jerline Pain, MD.     Allergies:   Allopurinol, Colchicine, Duloxetine, Elemental sulfur, and Oxycontin [oxycodone hcl]   Social History   Socioeconomic History   Marital status: Married    Spouse name: Not on file   Number of children: Not on file   Years of education: Not on file   Highest education level: Not on file  Occupational History   Not on file  Tobacco Use   Smoking status: Former    Types: Cigarettes    Quit date: 03/1970    Years since quitting: 51.7   Smokeless tobacco: Never  Vaping Use   Vaping Use: Never used  Substance and Sexual Activity   Alcohol use: No    Alcohol/week: 0.0 standard drinks of alcohol   Drug use: No   Sexual activity: Not on file  Other Topics Concern   Not on file  Social History Narrative   Not on file   Social Determinants of Health   Financial Resource Strain: Not on file  Food Insecurity: Not on file  Transportation Needs: Not on file  Physical Activity: Not on file  Stress: Not on file  Social Connections: Not on file     Family History: The patient's family history includes Diabetes in his mother; Hypertension in his mother.  ROS:   Please see the history of present illness.    (+) L knee pain  All other systems reviewed and are negative.  EKGs/Labs/Other Studies Reviewed:    Echo 03/18/20  1. Left ventricular ejection fraction, by estimation, is 45 to 50%. The  left ventricle has mildly decreased function. Left ventricular endocardial border not optimally defined to evaluate regional wall  motion. There is moderate left ventricular hypertrophy. Left ventricular diastolic parameters are indeterminate.   2. Right ventricular systolic function is normal. The right ventricular  size is mildly enlarged.   3. Left atrial size was mildly dilated.   4. Right atrial size was mildly dilated.   5. The mitral valve is normal in structure. No evidence of mitral valve  regurgitation. No evidence of mitral stenosis.   6. The aortic valve is tricuspid. There is mild calcification of the  aortic valve. There is mild thickening of the aortic valve. Aortic valve  regurgitation is not visualized. Mild aortic valve sclerosis is present,  with no evidence of aortic valve stenosis.   7. Aortic dilatation noted. There is mild dilatation at the level of the  sinuses of Valsalva, measuring 42 mm. There is mild dilatation of the  ascending aorta, measuring 43 mm.   8. The inferior vena cava is dilated in size with <50% respiratory  variability, suggesting right atrial pressure of 15 mmHg.   Lexiscan 10/09/19 Nuclear stress EF: 41%. There was no ST segment deviation noted during stress. No T wave inversion was noted during stress. Findings consistent with prior myocardial infarction. This is an intermediate risk study. The left ventricular ejection fraction is moderately decreased (30-44%).   1. There is a medium size (10-15% of LV), fixed, moderate to severe perfusion defect present in the basal to apical inferior segments and apex consistent with prior infarction. The wall motion in this region is hypokinetic, favoring prior infarction versus diaphragm attenuation.  2. No ischemia.  3. Moderately reduced LVEF, 41%.  4. This is an intermediate risk study.   Lower Extremity Doppler 07/24/19 Right: Resting right ankle-brachial index indicates noncompressible right lower extremity arteries. The right toe-brachial index is normal.   Left: Resting left ankle-brachial index indicates noncompressible  left  lower extremity arteries. The left toe-brachial index is normal.   EKG:  EKG was not ordered today 09/05/20: atrial fibrillation 60 bpm with right bundle branch block left anterior fascicular block  Recent Labs: No results found for requested labs within last 365 days.  Recent Lipid Panel No results found for: "CHOL", "TRIG", "HDL", "CHOLHDL", "VLDL", "LDLCALC", "LDLDIRECT"   Risk Assessment/Calculations:      Physical Exam:    VS:  There were no vitals taken for this visit.    Wt Readings from Last 3 Encounters:  06/17/21 187 lb (84.8 kg)  09/05/20 199 lb 3.2 oz (90.4 kg)  02/05/20 200 lb (90.7 kg)     GEN:  Well nourished, well developed in no acute distress HEENT: Normal NECK: No JVD; No carotid bruits LYMPHATICS: No lymphadenopathy CARDIAC: RRR with minimal ectopy, no murmurs, rubs, gallops RESPIRATORY:  Clear to auscultation without rales, wheezing or rhonchi  ABDOMEN: Soft, non-tender, non-distended MUSCULOSKELETAL:  No edema; No deformity  SKIN: Warm and dry NEUROLOGIC:  Alert and oriented x 3 PSYCHIATRIC:  Normal affect   ASSESSMENT:    No diagnosis found.   PLAN:    No problem-specific Assessment & Plan notes found for this encounter.       Medication Adjustments/Labs and Tests Ordered: Current medicines are reviewed at length with the patient today.  Concerns regarding medicines are outlined above.  No orders of the defined types were placed in this encounter.  No orders of the defined types were placed in this encounter.   There are no Patient Instructions on file for this visit.   I,Mykaella Javier,acting as a scribe for [employer], MD.,have documented all relevant documentation on the behalf of Donato Schultz, MD,as directed by  Donato Schultz, MD while in the presence of Donato Schultz, MD.  I, Myrtice Lauth, have reviewed all documentation for this visit. The documentation on 12/23/21 for the exam, diagnosis, procedures, and orders are all  accurate and complete.   Jarvis Newcomer  12/23/2021 9:45 AM    [facility]

## 2021-12-31 ENCOUNTER — Telehealth: Payer: Self-pay

## 2021-12-31 NOTE — Telephone Encounter (Signed)
Received call from pt stating he is now having INR/Warfarin managed at PCP. Pt stated since the Coumadin Clinic moved from [facility] to [place], it is very difficult for him to come to appointments. Pt stated he has spoken with his PCP and they will start managing INR.   I called PCP and spoke with Morrie Sheldon, RN. Confirmed pt has lab orders for 01/06/22 at PCP office and will now be managed by Dr Viviana Simpler.

## 2022-01-05 ENCOUNTER — Ambulatory Visit: Payer: Medicare HMO | Attending: Cardiology | Admitting: Cardiology

## 2022-01-05 ENCOUNTER — Encounter: Payer: Self-pay | Admitting: Cardiology

## 2022-01-05 VITALS — BP 165/111 | HR 57 | Ht 72.0 in | Wt 182.1 lb

## 2022-01-05 DIAGNOSIS — I5022 Chronic systolic (congestive) heart failure: Secondary | ICD-10-CM | POA: Diagnosis not present

## 2022-01-05 DIAGNOSIS — D6859 Other primary thrombophilia: Secondary | ICD-10-CM | POA: Diagnosis not present

## 2022-01-05 DIAGNOSIS — I77810 Thoracic aortic ectasia: Secondary | ICD-10-CM

## 2022-01-05 MED ORDER — CLONIDINE HCL 0.2 MG PO TABS: 0.2000 mg | ORAL_TABLET | Freq: Three times a day (TID) | ORAL | 3 refills | Status: DC

## 2022-01-05 NOTE — Progress Notes (Signed)
Cardiology Office Note:    Date:  01/05/2022   ID:  Ralph Sullivan, DOB 07-25-1945, MRN [id number]  PCP:  Lorenda Ishihara, MD   [facility] Providers Cardiologist:  Donato Schultz, MD     Referring MD: Lorenda Ishihara,*   History of Present Illness:    Ralph Sullivan is a 76 y.o. male here for follow-up of chronic systolic heart failure EF improvement from 25 up to 60% with permanent atrial fibrillation right bundle branch block left anterior fascicular block.  Followed closely at nephrology.  Kidney function has been stable.  At home, his blood pressure stays on average 120/60 and his heart rate ranges from 50 to 69 bpm.   He uses a walker to help with balance.  Quite a bit of knee pain.  He denies any palpitations, chest pain, or shortness of breath, lightheadedness, headaches, syncope, orthopnea, PND, lower extremity edema or exertional symptoms.  Past Medical History:  Diagnosis Date   Atrial fibrillation (HCC) 02/08/2013   CHADS-VAS - 2 (AGE, HTN). On anticoagulation. Holter 6/14 - 2.3 sec pause. Avg HR 87. 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    Past Surgical History:  Procedure Laterality Date   CATARACT EXTRACTION     HERNIA REPAIR     INGUINAL HERNIA REPAIR  07/16/2016   INGUINAL HERNIA REPAIR Left 07/16/2016   Procedure: LAPAROSCOPIC INGUINAL HERNIA REPAIR;  Surgeon: Rodman Pickle, MD;  Location: [facility];  Service: General;  Laterality: Left;   INSERTION OF MESH Left 07/16/2016   Procedure: INSERTION OF MESH;  Surgeon: De Blanch Kinsinger, MD;  Location: [facility] OR;   Service: General;  Laterality: Left;   KIDNEY STONE SURGERY      Current Medications: Current Meds  Medication Sig   cloNIDine (CATAPRES) 0.2 MG tablet Take 1 tablet (0.2 mg total) by mouth 3 (three) times daily.   ferrous sulfate 325 (65 FE) MG tablet Take 325 mg by mouth daily with breakfast.   metoprolol tartrate (LOPRESSOR) 100 MG tablet Take 100 mg by mouth 2 (two) times daily.   Omega-3 Fatty Acids (OMEGA 3 PO) Take 1 capsule by mouth 2 (two) times daily.    vitamin E 400 UNIT capsule Take 400 Units by mouth daily.   warfarin (COUMADIN) 5 MG tablet TAKE 1 TABLET BY MOUTH ONCE DAILY OR  AS  DIRECTED  BY  COUMADIN  CLINIC   [DISCONTINUED] cloNIDine (CATAPRES) 0.2 MG tablet Take 1 tablet by mouth twice daily     Allergies:   Allopurinol, Colchicine, Duloxetine, Elemental sulfur, and Oxycontin [oxycodone hcl]   Social History   Socioeconomic History   Marital status: Married    Spouse name: Not on file   Number of children: Not on file   Years of education: Not on file   Highest education level: Not on file  Occupational History   Not on file  Tobacco Use   Smoking status: Former    Types: Cigarettes    Quit date: 03/1970    Years since quitting: 51.8   Smokeless tobacco: Never  Vaping Use  Vaping Use: Never used  Substance and Sexual Activity   Alcohol use: No    Alcohol/week: 0.0 standard drinks of alcohol   Drug use: No   Sexual activity: Not on file  Other Topics Concern   Not on file  Social History Narrative   Not on file   Social Determinants of Health   Financial Resource Strain: Not on file  Food Insecurity: Not on file  Transportation Needs: Not on file  Physical Activity: Not on file  Stress: Not on file  Social Connections: Not on file     Family History: The patient's family history includes Diabetes in his mother; Hypertension in his mother.  ROS:   Please see the history of present illness.    (+) L knee pain  All other systems reviewed  and are negative.  EKGs/Labs/Other Studies Reviewed:    Echo 03/18/20  1. Left ventricular ejection fraction, by estimation, is 45 to 50%. The  left ventricle has mildly decreased function. Left ventricular endocardial border not optimally defined to evaluate regional wall motion. There is moderate left ventricular hypertrophy. Left ventricular diastolic parameters are indeterminate.   2. Right ventricular systolic function is normal. The right ventricular  size is mildly enlarged.   3. Left atrial size was mildly dilated.   4. Right atrial size was mildly dilated.   5. The mitral valve is normal in structure. No evidence of mitral valve  regurgitation. No evidence of mitral stenosis.   6. The aortic valve is tricuspid. There is mild calcification of the  aortic valve. There is mild thickening of the aortic valve. Aortic valve  regurgitation is not visualized. Mild aortic valve sclerosis is present,  with no evidence of aortic valve stenosis.   7. Aortic dilatation noted. There is mild dilatation at the level of the  sinuses of Valsalva, measuring 42 mm. There is mild dilatation of the  ascending aorta, measuring 43 mm.   8. The inferior vena cava is dilated in size with <50% respiratory  variability, suggesting right atrial pressure of 15 mmHg.   Lexiscan 10/09/19 Nuclear stress EF: 41%. There was no ST segment deviation noted during stress. No T wave inversion was noted during stress. Findings consistent with prior myocardial infarction. This is an intermediate risk study. The left ventricular ejection fraction is moderately decreased (30-44%).   1. There is a medium size (10-15% of LV), fixed, moderate to severe perfusion defect present in the basal to apical inferior segments and apex consistent with prior infarction. The wall motion in this region is hypokinetic, favoring prior infarction versus diaphragm attenuation.  2. No ischemia.  3. Moderately reduced LVEF, 41%.  4. This  is an intermediate risk study.   Lower Extremity Doppler 07/24/19 Right: Resting right ankle-brachial index indicates noncompressible right lower extremity arteries. The right toe-brachial index is normal.   Left: Resting left ankle-brachial index indicates noncompressible left  lower extremity arteries. The left toe-brachial index is normal.   EKG:  EKG was not ordered today 09/05/20: atrial fibrillation 60 bpm with right bundle branch block left anterior fascicular block  Recent Labs: No results found for requested labs within last 365 days.  Recent Lipid Panel No results found for: "CHOL", "TRIG", "HDL", "CHOLHDL", "VLDL", "LDLCALC", "LDLDIRECT"   Risk Assessment/Calculations:      Physical Exam:    VS:  BP (!) 165/111 (BP Location: Left Arm, Patient Position: Sitting, Cuff Size: Normal)   Pulse (!) 57   Ht 6' (1.829 m)  Wt 182 lb 2 oz (82.6 kg)   BMI 24.70 kg/m     Wt Readings from Last 3 Encounters:  01/05/22 182 lb 2 oz (82.6 kg)  06/17/21 187 lb (84.8 kg)  09/05/20 199 lb 3.2 oz (90.4 kg)     GEN:  Well nourished, well developed in no acute distress HEENT: Normal NECK: No JVD; No carotid bruits LYMPHATICS: No lymphadenopathy CARDIAC: Irreg irregular, no murmurs, rubs, gallops RESPIRATORY:  Clear to auscultation without rales, wheezing or rhonchi  ABDOMEN: Soft, non-tender, non-distended MUSCULOSKELETAL:  No edema; No deformity  SKIN: Warm and dry NEUROLOGIC:  Alert and oriented x 3 PSYCHIATRIC:  Normal affect   ASSESSMENT:    1. Dilated aortic root (HCC)   2. Chronic systolic heart failure (HCC)      PLAN:     Dilated aortic root (HCC) Stable on echocardiogram.  No changes.  4 to 4.5 cm sinus of Valsalva with 4.2 cm sinotubular junction.  Rechecking echocardiogram   Persistent atrial fibrillation (HCC) Doing well rate controlled.  Prior EKG showed heart rate of 60.  He is on metoprolol 100 mg twice a day.  Continue.  No changes.  No syncope.    Bifascicular block Carefully monitoring with AV nodal blocking agent for atrial fibrillation.  His metoprolol has been decreased to 100 mg twice a day.  Heart rate is stable.  Occasionally he will still have tachycardia with activity.   Chronic kidney disease, stage III (moderate) (HCC) Continuing to be managed by nephrology.  Overall last creatinine 1.6.   HTN (hypertension) Little bit elevated today but at home he is usually in the 120s and 130s.  He has clonidine 0.2 mg twice a day.  Metoprolol 100 mg twice a day (200 prior BID).   Hypercoagulable state due to atrial fibrillation (HCC) Continuing with warfarin.  Closely monitoring.  He has been on this for years.  No bleeding.  Prior INR 2.6.  Hemoglobin 14.2.  High risk medication management.   Chronic systolic heart failure (HCC) Echo 2021-EF 45%.  Unchanged from prior.  He is not on ACE inhibitor because of kidney disease.  Continue with metoprolol.  Well compensated.     Medication Adjustments/Labs and Tests Ordered: Current medicines are reviewed at length with the patient today.  Concerns regarding medicines are outlined above.  Orders Placed This Encounter  Procedures   ECHOCARDIOGRAM COMPLETE   Meds ordered this encounter  Medications   cloNIDine (CATAPRES) 0.2 MG tablet    Sig: Take 1 tablet (0.2 mg total) by mouth 3 (three) times daily.    Dispense:  270 tablet    Refill:  3    Patient Instructions  Medication Instructions:  Please increase Clonidine 0.2 mg to three times a day. Continue all other medications as listed.  *If you need a refill on your cardiac medications before your next appointment, please call your pharmacy*  Testing/Procedures: Your physician has requested that you have an echocardiogram. Echocardiography is a painless test that uses sound waves to create images of your heart. It provides your doctor with information about the size and shape of your heart and how well your heart's chambers and  valves are working. This procedure takes approximately one hour. There are no restrictions for this procedure.   Follow-Up: At [facility], you and your health needs are our priority.  As part of our continuing mission to provide you with exceptional heart care, we have created designated Provider Care Teams.  These Care  Teams include your primary Cardiologist (physician) and Advanced Practice Providers (APPs -  Physician Assistants and Nurse Practitioners) who all work together to provide you with the care you need, when you need it.  We recommend signing up for the patient portal called "MyChart".  Sign up information is provided on this After Visit Summary.  MyChart is used to connect with patients for Virtual Visits (Telemedicine).  Patients are able to view lab/test results, encounter notes, upcoming appointments, etc.  Non-urgent messages can be sent to your provider as well.   To learn more about what you can do with MyChart, go to [email or web address].    Your next appointment:   6 month(s)  The format for your next appointment:   In Person  Provider:   Candee Furbish, MD      Important Information About Sugar         Court Joy Javier,acting as a scribe for Candee Furbish, MD.,have documented all relevant documentation on the behalf of Candee Furbish, MD,as directed by  Candee Furbish, MD while in the presence of Candee Furbish, MD.  I, Candee Furbish, MD, have reviewed all documentation for this visit. The documentation on 01/05/22 for the exam, diagnosis, procedures, and orders are all accurate and complete.   Signed, Candee Furbish, MD  01/05/2022 9:55 AM    [facility]

## 2022-01-05 NOTE — Patient Instructions (Addendum)
Medication Instructions:  Please increase Clonidine 0.2 mg to three times a day. Continue all other medications as listed.  *If you need a refill on your cardiac medications before your next appointment, please call your pharmacy*  Testing/Procedures: Your physician has requested that you have an echocardiogram. Echocardiography is a painless test that uses sound waves to create images of your heart. It provides your doctor with information about the size and shape of your heart and how well your heart's chambers and valves are working. This procedure takes approximately one hour. There are no restrictions for this procedure.   Follow-Up: At [facility], you and your health needs are our priority.  As part of our continuing mission to provide you with exceptional heart care, we have created designated Provider Care Teams.  These Care Teams include your primary Cardiologist (physician) and Advanced Practice Providers (APPs -  Physician Assistants and Nurse Practitioners) who all work together to provide you with the care you need, when you need it.  We recommend signing up for the patient portal called "MyChart".  Sign up information is provided on this After Visit Summary.  MyChart is used to connect with patients for Virtual Visits (Telemedicine).  Patients are able to view lab/test results, encounter notes, upcoming appointments, etc.  Non-urgent messages can be sent to your provider as well.   To learn more about what you can do with MyChart, go to [email or web address].    Your next appointment:   6 month(s)  The format for your next appointment:   In Person  Provider:   Donato Schultz, MD      Important Information About Sugar

## 2022-01-06 ENCOUNTER — Ambulatory Visit: Payer: Medicare HMO

## 2022-02-02 DIAGNOSIS — D6859 Other primary thrombophilia: Secondary | ICD-10-CM | POA: Diagnosis not present

## 2022-02-17 DIAGNOSIS — D6859 Other primary thrombophilia: Secondary | ICD-10-CM | POA: Diagnosis not present

## 2022-02-20 ENCOUNTER — Telehealth: Payer: Self-pay | Admitting: *Deleted

## 2022-02-20 NOTE — Telephone Encounter (Signed)
Pt called and stated that he would like for [place] to start managing his Warfarin again. Scheduled pt to come in on 03/03/2022 to have INR checked. Pt stated that he is still taking warfarin 1 tablet daily (5mg ).   Pt stated that he last had his INR checked on 10/24 at his PCP's office who has been managing his warfarin for the past few months.   Called pt's PCP office and spoke to [place]. Made her aware that we will start managing pt's warfarin on 03/03/2022 when pt is reestablished with [place] (next INR check).  She confirmed plan with Dr. Fara Olden. Pt's last INR was 3.0 on 02/17/2022.

## 2022-03-03 ENCOUNTER — Ambulatory Visit: Payer: Medicare HMO | Attending: Internal Medicine | Admitting: *Deleted

## 2022-03-03 ENCOUNTER — Other Ambulatory Visit: Payer: Self-pay

## 2022-03-03 DIAGNOSIS — I4819 Other persistent atrial fibrillation: Secondary | ICD-10-CM

## 2022-03-03 DIAGNOSIS — Z7901 Long term (current) use of anticoagulants: Secondary | ICD-10-CM

## 2022-03-03 DIAGNOSIS — Z5181 Encounter for therapeutic drug level monitoring: Secondary | ICD-10-CM

## 2022-03-03 LAB — POCT INR: INR: 3.3 — AB (ref 2.0–3.0)

## 2022-03-03 MED ORDER — WARFARIN SODIUM 5 MG PO TABS: ORAL_TABLET | ORAL | 1 refills | Status: DC

## 2022-03-03 NOTE — Patient Instructions (Addendum)
Description   Do not take any warfarin today then continue taking Warfarin 1 tablet daily. Recheck INR in 2 weeks. Drinking 2 boost daily. Call us with any medication changes or questions to Coumadin Clinic [phone] or [phone].

## 2022-03-16 ENCOUNTER — Ambulatory Visit: Payer: Medicare HMO | Attending: Cardiology

## 2022-03-16 DIAGNOSIS — I4819 Other persistent atrial fibrillation: Secondary | ICD-10-CM | POA: Diagnosis not present

## 2022-03-16 DIAGNOSIS — Z5181 Encounter for therapeutic drug level monitoring: Secondary | ICD-10-CM

## 2022-03-16 LAB — POCT INR: INR: 2.6 (ref 2.0–3.0)

## 2022-03-16 NOTE — Patient Instructions (Signed)
Description   Continue taking Warfarin 1 tablet daily. Recheck INR in 3 weeks. Drinking 2 boost daily.  Call us with any medication changes or questions to Coumadin Clinic # [phone].

## 2022-03-20 ENCOUNTER — Other Ambulatory Visit: Payer: Self-pay | Admitting: Cardiology

## 2022-03-20 DIAGNOSIS — Z5181 Encounter for therapeutic drug level monitoring: Secondary | ICD-10-CM

## 2022-03-20 DIAGNOSIS — I4819 Other persistent atrial fibrillation: Secondary | ICD-10-CM

## 2022-04-02 ENCOUNTER — Other Ambulatory Visit: Payer: Self-pay

## 2022-04-02 MED ORDER — METOPROLOL TARTRATE 100 MG PO TABS: 100.0000 mg | ORAL_TABLET | Freq: Two times a day (BID) | ORAL | 2 refills | Status: DC

## 2022-04-02 MED ORDER — METOPROLOL TARTRATE 100 MG PO TABS: 100.0000 mg | ORAL_TABLET | Freq: Two times a day (BID) | ORAL | 3 refills | Status: DC

## 2022-04-06 ENCOUNTER — Ambulatory Visit: Payer: Medicare HMO | Attending: Cardiology

## 2022-04-06 DIAGNOSIS — I4819 Other persistent atrial fibrillation: Secondary | ICD-10-CM | POA: Diagnosis not present

## 2022-04-06 DIAGNOSIS — Z5181 Encounter for therapeutic drug level monitoring: Secondary | ICD-10-CM

## 2022-04-06 LAB — POCT INR: INR: 3.4 — AB (ref 2.0–3.0)

## 2022-04-06 NOTE — Patient Instructions (Signed)
HOLD TODAY ONLY THEN Continue taking Warfarin 1 tablet daily. Recheck INR in 3 weeks. Drinking 2 boost daily.  Call us with any medication changes or questions to Coumadin Clinic # [phone].

## 2022-04-14 ENCOUNTER — Encounter: Payer: Self-pay | Admitting: Nurse Practitioner

## 2022-04-14 ENCOUNTER — Ambulatory Visit: Payer: Medicare HMO | Attending: Nurse Practitioner | Admitting: Nurse Practitioner

## 2022-04-14 VITALS — BP 158/90 | HR 56 | Ht 73.0 in | Wt 180.6 lb

## 2022-04-14 DIAGNOSIS — I10 Essential (primary) hypertension: Secondary | ICD-10-CM

## 2022-04-14 DIAGNOSIS — I5022 Chronic systolic (congestive) heart failure: Secondary | ICD-10-CM

## 2022-04-14 DIAGNOSIS — N1831 Chronic kidney disease, stage 3a: Secondary | ICD-10-CM | POA: Diagnosis not present

## 2022-04-14 DIAGNOSIS — I4819 Other persistent atrial fibrillation: Secondary | ICD-10-CM

## 2022-04-14 MED ORDER — HYDRALAZINE HCL 10 MG PO TABS: 10.0000 mg | ORAL_TABLET | Freq: Three times a day (TID) | ORAL | 1 refills | Status: DC

## 2022-04-14 NOTE — Progress Notes (Signed)
Office Visit    Patient Name: Ralph Sullivan Date of Encounter: 04/14/2022  Primary Care Provider:  Lorenda Ishihara, MD Primary Cardiologist:  Donato Schultz, MD Primary Electrophysiologist: None  Chief Complaint    Ralph Sullivan is a 76 y.o. 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  Past Medical History    Past Medical History:  Diagnosis Date   Atrial fibrillation (HCC) 02/08/2013   CHADS-VAS - 2 (AGE, HTN). On anticoagulation. Holter 6/14 - 2.3 sec pause. Avg HR 87. 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   Past Surgical History:  Procedure Laterality Date   CATARACT EXTRACTION     HERNIA REPAIR     INGUINAL HERNIA REPAIR  07/16/2016   INGUINAL HERNIA REPAIR Left 07/16/2016   Procedure: LAPAROSCOPIC INGUINAL HERNIA REPAIR;  Surgeon: Rodman Pickle, MD;  Location: [facility];  Service: General;  Laterality: Left;   INSERTION OF MESH Left 07/16/2016   Procedure: INSERTION OF MESH;  Surgeon: De Blanch Kinsinger, MD;  Location: [facility] OR;  Service: General;  Laterality: Left;   KIDNEY STONE SURGERY      Allergies  Allergies  Allergen Reactions   Allopurinol Other (See Comments)   Colchicine Other (See Comments)   Duloxetine Other (See Comments)    More nervous   Elemental Sulfur Nausea And Vomiting   Oxycontin [Oxycodone Hcl] Nausea And Vomiting    History of Present Illness    Ralph Sullivan  is a 76 year old male with the above mention past  medical history who presents today for complaint of elevated blood pressure.  He was first seen by Dr. Anne Fu in 2014 for management of atrial fibrillation.  2D echo was completed demonstrating aortic root dilation of 45 mm with mild LVH and normal EF with reduction to 42 mm and 09/2013.  He underwent nuclear stress test and 03/2014 that was overall reassuring.  He suffered a fractured hip in 2018 that required surgical pinning.  He is followed was found to be mechanical and not secondary to syncope.  He had complained of leg pain and ABIs were performed that were normal.  2D echo was completed showing dilated cardiomyopathy with EF of 25% started on Entresto but stopped it due to concern possible leg pain.  Nuclear stress test performed due to chest discomfort that was low risk related to possible reflux or GERD.  Complaint of chest discomfort 08/2020 was no further workup needed.  He was last seen 05/2021 for during visit patient was doing well with good pressure control.  He was hoping to undergo procedure on his knee.   Ralph Sullivan presents today alone for complaint of elevated blood pressures.  Since last being seen in the office patient reports that his blood pressures have been elevating over the past 3 weeks with numbers running in the 260s over 120s per his wrist cuff and this morning with medications 160/90.  Today in the office his pressure was 150/100  and was 158/92 on recheck.  He denies any sodium indiscretions and states that his legs have been weak and sore.  He previously took allopurinol but is currently not taking due to renal function.  He is staying hydrated and drinks boost shakes. He said when his pressure is elevated he gets swimmy headed.  He is compliant with his current medication regimen and denies any adverse reactions.  Patient denies chest pain, palpitations, dyspnea, PND, orthopnea, nausea, vomiting, dizziness, syncope, edema, weight gain, or early satiety.   Home Medications     Current Outpatient Medications  Medication Sig Dispense Refill   acetaminophen (TYLENOL) 325 MG suppository Place 325 mg rectally every 4 (four) hours as needed.     cloNIDine (CATAPRES) 0.2 MG tablet Take 1 tablet (0.2 mg total) by mouth 3 (three) times daily. 270 tablet 3   hydrALAZINE (APRESOLINE) 10 MG tablet Take 1 tablet (10 mg total) by mouth 3 (three) times daily. 270 tablet 1   metoprolol tartrate (LOPRESSOR) 100 MG tablet Take 1 tablet (100 mg total) by mouth 2 (two) times daily. 180 tablet 2   Omega-3 Fatty Acids (OMEGA 3 PO) Take 1 capsule by mouth 2 (two) times daily.      vitamin E 400 UNIT capsule Take 400 Units by mouth daily.     warfarin (COUMADIN) 5 MG tablet TAKE 1 TABLET BY MOUTH ONCE DAILY OR  AS  DIRECTED  BY  THE  COUMADIN  CLINIC 100 tablet 0   No current facility-administered medications for this visit.     Review of Systems  Please see the history of present illness.    (+) Swimmy headedness bilateral leg weakness (+) Sweaty hands  All other systems reviewed and are otherwise negative except as noted above.  Physical Exam    Wt Readings from Last 3 Encounters:  04/14/22 180 lb 9.6 oz (81.9 kg)  01/05/22 182 lb 2 oz (82.6 kg)  06/17/21 187 lb (84.8 kg)   VS: Vitals:   04/14/22 1350 04/14/22 1427  BP: (!) 150/100 (!) 158/90  Pulse: (!) 56   SpO2: 96%   ,Body mass index is 23.83 kg/m.  Constitutional:      Appearance: Healthy appearance. Not in distress.  Neck:     Vascular: JVD normal.  Pulmonary:     Effort: Pulmonary effort is normal.     Breath sounds: No wheezing. No rales. Diminished in the bases Cardiovascular:     Irregularly irregular normal S1. Normal S2.      Murmurs: There is no murmur.  Edema:    Peripheral edema absent.  Abdominal:     Palpations: Abdomen is soft non tender. There is no hepatomegaly.  Skin:    General: Skin is warm and dry.  Neurological:     General: No focal deficit present.     Mental Status: Alert and  oriented to person, place and time.     Cranial Nerves: Cranial nerves are intact.  EKG/LABS/Other Studies Reviewed    ECG personally reviewed by me today -atrial fibrillation with slow RVR and RBBB with left anterior fascicular block acute changes consistent with previous EKG.  Risk Assessment/Calculations:    CHA2DS2-VASc Score = 6   This indicates a 9.7% annual risk of stroke. The patient's score is based upon: CHF History: 1 HTN History: 1 Diabetes History: 0 Stroke History: 2 Vascular Disease History: 1 Age Score: 1 Gender Score: 0           Lab Results  Component Value Date   WBC 10.2 04/01/2017   HGB 14.6 04/01/2017   HCT 44.2 04/01/2017   MCV 93.4 04/01/2017   PLT 177 04/01/2017   Lab Results  Component Value Date   CREATININE 1.39 (H) 10/19/2017   BUN 19 10/19/2017   NA 130 (L) 10/19/2017   K 4.3 10/19/2017   CL 93 (L) 10/19/2017   CO2 22 10/19/2017   Lab Results  Component Value Date   ALT 12 (L) 07/16/2016   AST 27 07/16/2016   ALKPHOS 101 07/16/2016   BILITOT 0.6 07/16/2016   No results found for: "CHOL", "HDL", "LDLCALC", "LDLDIRECT", "TRIG", "CHOLHDL"  Lab Results  Component Value Date   HGBA1C 7.0 (H) 10/15/2015    Assessment & Plan    1.  Essential hypertension: HYPERTENSION CONTROL Vitals:   04/14/22 1350 04/14/22 1427  BP: (!) 150/100 (!) 158/90    The patient's blood pressure is elevated above target today.  In order to address the patient's elevated BP: A current anti-hypertensive medication was adjusted today.; Blood pressure will be monitored at home to determine if medication changes need to be made.    - We will add hydralazine 10 mg 3 times daily -Continue clonidine 0.2 mg and metoprolol 100 mg twice daily -He will document blood pressures over the next 2 weeks and report back to clinic.  2.  Permanent atrial fibrillation -Patient is in AF with slow RVR rate of 56 bpm. -Metoprolol 100 mg was decreased to twice daily  from 3 times daily due to bifascicular block. -Continue Coumadin 5 mg daily - Coumadin clinic monitoring dose with no complaints of bleeding or adverse reactions. -CHA2DS2-VASc Score = 6 [CHF History: 1, HTN History: 1, Diabetes History: 0, Stroke History: 2, Vascular Disease History: 1, Age Score: 1, Gender Score: 0].  Therefore, the patient's annual risk of stroke is 9.7 %.      3.  Chronic systolic CHF: -Completed 2021 with an EF of 45% -He is currently not on GDMT due to kidney disease. -He is euvolemic on exam today reports no indiscretion with salt. -Continue metoprolol 100 mg twice daily -Low sodium diet, fluid restriction <2L, and daily weights encouraged. Educated to contact our office for weight gain of 2 lbs overnight or 5 lbs in one week.   4.  CKD stage III: -Most recent creatinine was 1.49 -Currently followed by nephrology  Disposition: Follow-up with Donato Schultz, MD or APP in 1 months    Medication Adjustments/Labs and Tests Ordered: Current medicines are reviewed at length with the patient today.  Concerns regarding medicines are outlined above.   Signed, Napoleon Form, Leodis Rains, NP 04/14/2022, 2:40 PM [facility]  Note:  This document was prepared using Dragon voice recognition software and may include unintentional dictation errors.

## 2022-04-14 NOTE — Patient Instructions (Addendum)
Medication Instructions:  Your physician has recommended you make the following change in your medication:   START Hydralazine 10 mg taking 1 three times a day   *If you need a refill on your cardiac medications before your next appointment, please call your pharmacy*   Lab Work: None ordered  If you have labs (blood work) drawn today and your tests are completely normal, you will receive your results only by: MyChart Message (if you have MyChart) OR A paper copy in the mail If you have any lab test that is abnormal or we need to change your treatment, we will call you to review the results.   Testing/Procedures: None ordered   Follow-Up: At [facility], you and your health needs are our priority.  As part of our continuing mission to provide you with exceptional heart care, we have created designated Provider Care Teams.  These Care Teams include your primary Cardiologist (physician) and Advanced Practice Providers (APPs -  Physician Assistants and Nurse Practitioners) who all work together to provide you with the care you need, when you need it.  We recommend signing up for the patient portal called "MyChart".  Sign up information is provided on this After Visit Summary.  MyChart is used to connect with patients for Virtual Visits (Telemedicine).  Patients are able to view lab/test results, encounter notes, upcoming appointments, etc.  Non-urgent messages can be sent to your provider as well.   To learn more about what you can do with MyChart, go to [email or web address].    Your next appointment:   1 month(s)  The format for your next appointment:   In Person  Provider:   Robin Searing, NP         Other Instructions Your physician has requested that you regularly monitor and record your blood pressure readings at home. Please use the same machine at the same time of day to check your readings and record them to bring to your follow-up visit.   Please monitor blood  pressures twice a day for 2 weeks and keep a log of your readings and send a mychart message with them.    Make sure to check 2 hours after your medications.    AVOID these things for 30 minutes before checking your blood pressure: No Drinking caffeine. No Drinking alcohol. No Eating. No Smoking. No Exercising.   Five minutes before checking your blood pressure: Pee. Sit in a dining chair. Avoid sitting in a soft couch or armchair. Be quiet. Do not talk   Important Information About Sugar

## 2022-04-19 ENCOUNTER — Encounter (HOSPITAL_COMMUNITY): Payer: Self-pay | Admitting: Emergency Medicine

## 2022-04-19 ENCOUNTER — Emergency Department (HOSPITAL_COMMUNITY)
Admission: EM | Admit: 2022-04-19 | Discharge: 2022-04-19 | Discharge status: Against Medical Advice | Payer: Medicare HMO | Attending: Emergency Medicine | Admitting: Emergency Medicine

## 2022-04-19 DIAGNOSIS — I4510 Unspecified right bundle-branch block: Secondary | ICD-10-CM | POA: Insufficient documentation

## 2022-04-19 DIAGNOSIS — Z7901 Long term (current) use of anticoagulants: Secondary | ICD-10-CM | POA: Diagnosis not present

## 2022-04-19 DIAGNOSIS — R079 Chest pain, unspecified: Secondary | ICD-10-CM | POA: Diagnosis not present

## 2022-04-19 DIAGNOSIS — I4891 Unspecified atrial fibrillation: Secondary | ICD-10-CM | POA: Diagnosis not present

## 2022-04-19 DIAGNOSIS — Z743 Need for continuous supervision: Secondary | ICD-10-CM | POA: Diagnosis not present

## 2022-04-19 DIAGNOSIS — R0789 Other chest pain: Secondary | ICD-10-CM | POA: Diagnosis not present

## 2022-04-19 DIAGNOSIS — Z5321 Procedure and treatment not carried out due to patient leaving prior to being seen by health care provider: Secondary | ICD-10-CM | POA: Insufficient documentation

## 2022-04-19 DIAGNOSIS — I10 Essential (primary) hypertension: Secondary | ICD-10-CM | POA: Insufficient documentation

## 2022-04-19 LAB — CBC WITH DIFFERENTIAL/PLATELET
Abs Immature Granulocytes: 0.02 10*3/uL (ref 0.00–0.07)
Basophils Absolute: 0.1 10*3/uL (ref 0.0–0.1)
Basophils Relative: 1 %
Eosinophils Absolute: 0.2 10*3/uL (ref 0.0–0.5)
Eosinophils Relative: 3 %
HCT: 39.1 % (ref 39.0–52.0)
Hemoglobin: 13 g/dL (ref 13.0–17.0)
Immature Granulocytes: 0 %
Lymphocytes Relative: 22 %
Lymphs Abs: 1.5 10*3/uL (ref 0.7–4.0)
MCH: 30.9 pg (ref 26.0–34.0)
MCHC: 33.2 g/dL (ref 30.0–36.0)
MCV: 92.9 fL (ref 80.0–100.0)
Monocytes Absolute: 0.7 10*3/uL (ref 0.1–1.0)
Monocytes Relative: 10 %
Neutro Abs: 4.6 10*3/uL (ref 1.7–7.7)
Neutrophils Relative %: 64 %
Platelets: 148 10*3/uL — ABNORMAL LOW (ref 150–400)
RBC: 4.21 MIL/uL — ABNORMAL LOW (ref 4.22–5.81)
RDW: 13.2 % (ref 11.5–15.5)
WBC: 7 10*3/uL (ref 4.0–10.5)
nRBC: 0 % (ref 0.0–0.2)

## 2022-04-19 LAB — PROTIME-INR
INR: 2.6 — ABNORMAL HIGH (ref 0.8–1.2)
Prothrombin Time: 27.7 seconds — ABNORMAL HIGH (ref 11.4–15.2)

## 2022-04-19 LAB — COMPREHENSIVE METABOLIC PANEL
ALT: 10 U/L (ref 0–44)
AST: 20 U/L (ref 15–41)
Albumin: 3.3 g/dL — ABNORMAL LOW (ref 3.5–5.0)
Alkaline Phosphatase: 70 U/L (ref 38–126)
Anion gap: 9 (ref 5–15)
BUN: 28 mg/dL — ABNORMAL HIGH (ref 8–23)
CO2: 26 mmol/L (ref 22–32)
Calcium: 9.3 mg/dL (ref 8.9–10.3)
Chloride: 95 mmol/L — ABNORMAL LOW (ref 98–111)
Creatinine, Ser: 1.76 mg/dL — ABNORMAL HIGH (ref 0.61–1.24)
GFR, Estimated: 40 mL/min — ABNORMAL LOW (ref >60)
Glucose, Bld: 118 mg/dL — ABNORMAL HIGH (ref 70–99)
Potassium: 4.5 mmol/L (ref 3.5–5.1)
Sodium: 130 mmol/L — ABNORMAL LOW (ref 135–145)
Total Bilirubin: 0.8 mg/dL (ref 0.3–1.2)
Total Protein: 6.2 g/dL — ABNORMAL LOW (ref 6.5–8.1)

## 2022-04-19 NOTE — ED Triage Notes (Signed)
Pt from home and bottom number was 130. He took clonidine. EMS checked it and 140/110. No other complaints. Vital WNL.

## 2022-04-19 NOTE — ED Provider Triage Note (Signed)
Emergency Medicine Provider Triage Evaluation Note  ULRIC SALZMAN , a 76 y.o. male  was evaluated in triage.  Pt complains of HTN.  Checked his BP at home and numbers were elevated so he was concerned.  Took his clonidine around 11pm.  No chest pain, SOB.  BP 140/110 with EMS.  Denies chest pain or SOB.  Saw cardiology last week about BP and had medication adjusted but numbers seem worse now.  Review of Systems  Positive: HTN Negative: fever  Physical Exam  BP (!) 156/106 (BP Location: Left Arm)   Pulse (!) 47   Temp 98.3 F (36.8 C)   Resp 15   SpO2 99%  Gen:   Awake, no distress   Resp:  Normal effort  MSK:   Moves extremities without difficulty  Other:    Medical Decision Making  Medically screening exam initiated at 1:29 AM.  Appropriate orders placed.  Carrson ZOLTON DOWSON was informed that the remainder of the evaluation will be completed by another provider, this initial triage assessment does not replace that evaluation, and the importance of remaining in the ED until their evaluation is complete.  HTN.  BP 156/106 upon arrival to triage.  No chest pain or SOB.  Hx of AFIB, on coumadin.  EKG, labs.   Garlon Hatchet, PA-C 04/19/22 [phone]

## 2022-04-19 NOTE — ED Notes (Signed)
Patient family states they are taking him home. Educated pt family on following up with PCP or returning if symptoms persist.

## 2022-04-21 ENCOUNTER — Telehealth: Payer: Self-pay | Admitting: Cardiology

## 2022-04-21 MED ORDER — CLONIDINE HCL 0.1 MG PO TABS: ORAL_TABLET | ORAL | 0 refills | Status: DC

## 2022-04-21 NOTE — Telephone Encounter (Signed)
Pt seen in clinic 04/14/22 by Robin Searing, NP for HTN:  he patient's blood pressure is elevated above target today.   In order to address the patient's elevated BP: A current anti-hypertensive medication was adjusted today.; Blood pressure will be monitored at home to determine if medication changes need to be made.    - We will add hydralazine 10 mg 3 times daily -Continue clonidine 0.2 mg and metoprolol 100 mg twice daily -He will document blood pressures over the next 2 weeks and report back to clinic.  Returned call to patient who states he has fallen twice since starting the Hydralazine. States "I wopped my knee pretty good this last time, it just makes me so swimmy headed." 12/20  200/48 HR 44 (am) 138/83(lunch) 131/82 HR56 (pm) No readings from 12/21 12/22 177/117 HR 44 181/107 176/106 HR44 12/23 177/110 HR 51 157/107 173/104 HR63 12/24 161/90 HR 61  146/85  139/74 HR55 12/25 169/87 HR61  178/79  181/96 HR63 12/26 199/127 HR58*states hands were numb and eyes "couldn't see nothing for a while." Says he took his medicine and it all got better. Takes BP reading and medications (at same time) each morning and night, with PM dose of medication occurring approximately 5:30pm each day. Advised he should move PM doses of medication to closer to bedtime, but will send to [facility] for review and I'll call him back. ED precautions given, but he states "I went and waited 6.5 hrs and still left cause 14 ppl were ahead of me."

## 2022-04-21 NOTE — Telephone Encounter (Signed)
Pt c/o BP issue: STAT if pt c/o blurred vision, one-sided weakness or slurred speech  1. What are your last 5 BP readings?   199/127 HR 58 before bed yesterday 178/79  HR 63 yesterday evening 169/87 HR 51 12pm yesterday 199/129  This morning 189/64 HR 44   2. Are you having any other symptoms (ex. Dizziness, headache, blurred vision, passed out)? States hands are going numb. Went to emergency Saturday night but left.   3. What is your BP issue? Elevated BP, patient states he went to the emergency room Saturday night but left before he was seen. He states the new BP medication he is on is making him so "swimmy head".

## 2022-04-21 NOTE — Telephone Encounter (Signed)
Gaston Islam., NP  Eugene Gavia K Caller: Unspecified (Today, 10:46 AM) Please instruct Mr. Dacy to change his hydralazine to twice a day instead of 3 times per day.  He needs to space out his medications at least 12 hours apart. We will have him take a as needed dose of clonidine 0.1 mg for systolic greater than 140 and diastolic greater than 90.  Please advise him to seek care at one of the med centers for further evaluation if his symptoms continue to persist.  Please let me know if have any additional questions.  Returned call to patient. He verbalized understanding all above instructions. States he will move his PM doses to closer to bedtime. He will decrease hydralazine. RX for 0.1mg  Clonidine called into pharmacy, so that pt doesn't run out of his current rx too early. He will continue to monitor BP and report back to [place] in 1 week.

## 2022-04-23 ENCOUNTER — Telehealth: Payer: Self-pay | Admitting: Cardiology

## 2022-04-23 NOTE — Telephone Encounter (Signed)
Pt c/o BP issue: STAT if pt c/o blurred vision, one-sided weakness or slurred speech  1. What are your last 5 BP readings?  158/88 HR 54 last night 155/117 this morning when he took his morning meds.   2. Are you having any other symptoms (ex. Dizziness, headache, blurred vision, passed out)? States he is dizzy, and hands are tingly.   3. What is your BP issue? States BP is still high.

## 2022-04-23 NOTE — Telephone Encounter (Signed)
Spoke with pt who is reporting this AM his BP was 170/127.  He was having blurred vision, his hands and face feel tight and numb.  Reviewed current medications as he should be taking Metoprolol Tartrate 100 mg BID, Hydralazine 10 mg BID (recently decreased from TID) Clonidine 0.2 mg TID with 0.1 mg as needed for BP elevated over 140/90.  In speaking with pt he has been taking Metoprolol 100 mg BID, hydralazine BID but Clonidine he understood he was supposed to cut them in half and take 1/2 TID.  Pt also feels as though every time he tried to take Hydralazine his blood pressure goes up higher and makes him "feel funny."  Instructed pt to take medications as ordered - Metoprolol 100 mg BID, Clonidine 0.2 mg a whole tablet TID, and Hydralazine 10 mg BID.  Pt prefers not to continue taking Hydralazine as he feels he is having a reaction to it.  He will take Clonidine 0.1 mg prn as instructed.  Advised pt to continue to monitor his BP (at least 1 hr after taking medication) and call back if no improvement.

## 2022-04-24 MED ORDER — AMLODIPINE BESYLATE 5 MG PO TABS: 5.0000 mg | ORAL_TABLET | Freq: Every day | ORAL | 3 refills | Status: DC

## 2022-04-24 NOTE — Telephone Encounter (Signed)
Patient states last night his BP went up to 170/130. He states this morning the bottom number went up to 131. He says when he woke up the bottom number was 127 and after he took his medication it was 119.

## 2022-04-24 NOTE — Telephone Encounter (Signed)
Patient stated his BP was 170/130 this AM.  He is taking: Hydralazine 10mg  twice daily Clonidine 0.2mg  three times daily Clonidine 0.1mg  prn for SBP  more than 140 or DBS more than 90 Metoprolol tartrate 100mg  twice daily  Patient reports headache and weak eyes. He would like to come off hydralazine and try another medication due to headaches. Current BP 148/80. Denies chest pain or SOB.

## 2022-04-24 NOTE — Telephone Encounter (Signed)
With markedly elevated diastolic BP's, would add amlodipine 5 mg daily to see if he responds better to this. He should continue on clonidine and metoprolol as he is doing. Please ask him to log BP's 3-4 days/week and bring in to his follow-up visits. He has mild LV dysfunction but reasonable to try amlodipine with otherwise limited options in the setting of his kidney disease and other comorbidities.

## 2022-04-24 NOTE — Telephone Encounter (Signed)
With markedly elevated diastolic BP's, would add amlodipine 5 mg daily to see if he responds better to this. He should continue on clonidine and metoprolol as he is doing. Please ask him to log BP's 3-4 days/week and bring in to his follow-up visits. He has mild LV dysfunction but reasonable to try amlodipine with otherwise limited options in the setting of his kidney disease and other comorbidities.    Patient verbalized understanding of amlodipine. The hydralazine was discontinued by E. Louanne Skye, NP. Patient advised of this and verbalized understanding. Patient will keep a BP log and bring to appointments. Recommended to patient that if he feels his BP is not being controlled over the weekend, he can call the cardiologist on call at [phone]. He verbalized understanding and thanked me for the call.

## 2022-04-28 ENCOUNTER — Ambulatory Visit (HOSPITAL_COMMUNITY): Payer: Medicare HMO

## 2022-04-29 ENCOUNTER — Ambulatory Visit: Payer: Medicare HMO

## 2022-04-29 ENCOUNTER — Telehealth: Payer: Self-pay | Admitting: Cardiology

## 2022-04-29 ENCOUNTER — Ambulatory Visit (HOSPITAL_COMMUNITY): Payer: Medicare HMO | Attending: Cardiology

## 2022-04-29 DIAGNOSIS — I77810 Thoracic aortic ectasia: Secondary | ICD-10-CM | POA: Diagnosis not present

## 2022-04-29 DIAGNOSIS — I5022 Chronic systolic (congestive) heart failure: Secondary | ICD-10-CM

## 2022-04-29 NOTE — Telephone Encounter (Signed)
Pt c/o BP issue: STAT if pt c/o blurred vision, one-sided weakness or slurred speech  1. What are your last 5 BP readings?  178/117  2. Are you having any other symptoms (ex. Dizziness, headache, blurred vision, passed out)?  Patient denies having symptoms  3. What is your BP issue? Patient calling to provide elevated BP reading taken this morning, requesting to speak with Dr. Marlou Porch' nurse. Patient is scheduled to come in for an echo at 11:15 AM.

## 2022-04-29 NOTE — Telephone Encounter (Signed)
Called pt back and left message to c/b to discuss his current medications and BP.

## 2022-04-29 NOTE — Telephone Encounter (Signed)
Spoke with pt and reviewed BP concerns today.  He did start Amlodipine 5 mg (RX sent to pharm 12/29)   but reports he doesn't feel like it is helpful.  He reports he is taking his Clonidine 0.2 mg TID and Metoprolol tartrate 100 mg BID.  He takes extra 0.1 mg of Clonidine when needed.  Advised it does take time for new medication to "get into your system" before your see an improvement.  Pt will continue to monitor BP.  Appt scheduled with Dr Marlou Porch on 05/07/2022 for further evaluation.  Pt to call back prior to then if questions or concerns.

## 2022-04-29 NOTE — Telephone Encounter (Signed)
Pt is returning call. Transferred to Pamela, RN.  

## 2022-04-30 LAB — ECHOCARDIOGRAM COMPLETE
Area-P 1/2: 4.24 cm2
S' Lateral: 3.6 cm

## 2022-05-04 ENCOUNTER — Ambulatory Visit: Payer: Medicare HMO | Attending: Cardiology

## 2022-05-04 DIAGNOSIS — Z5181 Encounter for therapeutic drug level monitoring: Secondary | ICD-10-CM

## 2022-05-04 DIAGNOSIS — I4819 Other persistent atrial fibrillation: Secondary | ICD-10-CM | POA: Diagnosis not present

## 2022-05-04 LAB — POCT INR: INR: 2.3 (ref 2.0–3.0)

## 2022-05-04 NOTE — Patient Instructions (Signed)
Continue taking Warfarin 1 tablet daily. Recheck INR in 6 weeks. Drinking 2 boost daily.  Call us with any medication changes or questions to Coumadin Clinic # [phone].

## 2022-05-07 ENCOUNTER — Encounter: Payer: Self-pay | Admitting: Cardiology

## 2022-05-07 ENCOUNTER — Ambulatory Visit: Payer: Medicare HMO | Attending: Cardiology | Admitting: Cardiology

## 2022-05-07 VITALS — BP 142/82 | HR 80 | Ht 72.0 in | Wt 180.6 lb

## 2022-05-07 DIAGNOSIS — N1831 Chronic kidney disease, stage 3a: Secondary | ICD-10-CM

## 2022-05-07 DIAGNOSIS — I5022 Chronic systolic (congestive) heart failure: Secondary | ICD-10-CM | POA: Diagnosis not present

## 2022-05-07 DIAGNOSIS — Z7901 Long term (current) use of anticoagulants: Secondary | ICD-10-CM | POA: Diagnosis not present

## 2022-05-07 DIAGNOSIS — I10 Essential (primary) hypertension: Secondary | ICD-10-CM

## 2022-05-07 NOTE — Progress Notes (Signed)
Cardiology Office Note:    Date:  05/07/2022   ID:  Ralph Sullivan, DOB 1945/11/07, MRN [id number]  PCP:  Lorenda Ishihara, MD   [facility] Providers Cardiologist:  Donato Schultz, MD     Referring MD: Lorenda Ishihara,*   History of Present Illness:    Ralph Sullivan is a 77 y.o. 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  Followed closely at nephrology.  Kidney function has been stable.  At home, his blood pressure stays on average 120/60 and his heart rate ranges from 50 to 69 bpm.  At times it can be elevated.  He has been calling him frequently about elevated blood pressures.  He tried amlodipine but this did not go well with him.  He felt worse.  Larey Seat he states.  See below for details.  He uses a walker to help with balance.  Quite a bit of knee pain at times.  Ralph Sullivan here with him. POA.    He denies any palpitations, chest pain, or shortness of breath, lightheadedness, headaches, syncope, orthopnea, PND, lower extremity edema or exertional symptoms.  Past Medical History:  Diagnosis Date   Atrial fibrillation (HCC) 02/08/2013   CHADS-VAS - 2 (AGE, HTN). On anticoagulation. Holter 6/14 - 2.3 sec pause. Avg HR 87. 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    Past Surgical History:  Procedure Laterality Date   CATARACT EXTRACTION     HERNIA REPAIR     INGUINAL HERNIA REPAIR  07/16/2016   INGUINAL HERNIA REPAIR Left  07/16/2016   Procedure: LAPAROSCOPIC INGUINAL HERNIA REPAIR;  Surgeon: Rodman Pickle, MD;  Location: [facility] OR;  Service: General;  Laterality: Left;   INSERTION OF MESH Left 07/16/2016   Procedure: INSERTION OF MESH;  Surgeon: De Blanch Kinsinger, MD;  Location: [facility] OR;  Service: General;  Laterality: Left;   KIDNEY STONE SURGERY      Current Medications: Current Meds  Medication Sig   acetaminophen (TYLENOL) 325 MG suppository Place 325 mg rectally every 4 (four) hours as needed.   cloNIDine (CATAPRES) 0.1 MG tablet Take 1 tablet by mouth if systolic (top number) greater than and diastolic (bottom number) greater than .   cloNIDine (CATAPRES) 0.2 MG tablet Take 1 tablet (0.2 mg total) by mouth 3 (three) times daily.   metoprolol tartrate (LOPRESSOR) 100 MG tablet Take 1 tablet (100 mg total) by mouth 2 (two) times daily.   Omega-3 Fatty Acids (OMEGA 3 PO) Take 1 capsule by mouth 2 (two) times daily.    vitamin E 400 UNIT capsule Take 400 Units by mouth daily.   warfarin (COUMADIN) 5 MG tablet TAKE 1 TABLET BY MOUTH ONCE DAILY OR  AS  DIRECTED  BY  THE  COUMADIN  CLINIC   [DISCONTINUED] amLODipine (NORVASC) 5 MG tablet Take 1 tablet (5 mg total) by mouth daily.     Allergies:   Allopurinol, Colchicine, Duloxetine, Elemental sulfur, and Oxycontin [  oxycodone hcl]   Social History   Socioeconomic History   Marital status: Married    Spouse name: Not on file   Number of children: Not on file   Years of education: Not on file   Highest education level: Not on file  Occupational History   Not on file  Tobacco Use   Smoking status: Former    Types: Cigarettes    Quit date: 03/1970    Years since quitting: 52.1   Smokeless tobacco: Never  Vaping Use   Vaping Use: Never used  Substance and Sexual Activity   Alcohol use: No    Alcohol/week: 0.0 standard drinks of alcohol   Drug use: No   Sexual activity: Not on file  Other Topics Concern   Not on file  Social  History Narrative   Not on file   Social Determinants of Health   Financial Resource Strain: Not on file  Food Insecurity: Not on file  Transportation Needs: Not on file  Physical Activity: Not on file  Stress: Not on file  Social Connections: Not on file     Family History: The patient's family history includes Diabetes in his mother; Hypertension in his mother.  ROS:   Please see the history of present illness.    (+) L knee pain  All other systems reviewed and are negative.  EKGs/Labs/Other Studies Reviewed:     ECHO 04/29/2022:   1. Left ventricular ejection fraction, by estimation, is 45 to 50%. The  left ventricle has mildly decreased function. The left ventricle  demonstrates global hypokinesis. There is mild concentric left ventricular  hypertrophy. Left ventricular diastolic  function could not be evaluated.   2. Right ventricular systolic function is normal. The right ventricular  size is moderately enlarged. There is mildly elevated pulmonary artery  systolic pressure. The estimated right ventricular systolic pressure is  41.4 mmHg.   3. Left atrial size was moderately dilated.   4. Right atrial size was moderately dilated.   5. The mitral valve is normal in structure. Trivial mitral valve  regurgitation. No evidence of mitral stenosis.   6. The aortic valve is tricuspid. There is mild calcification of the  aortic valve. There is mild thickening of the aortic valve. Aortic valve  regurgitation is trivial. Aortic valve sclerosis is present, with no  evidence of aortic valve stenosis.   7. Aortic dilatation noted. There is mild dilatation of the ascending  aorta, measuring 41 mm.   Comparison(s): No significant change from prior study. Mild ascending  aorta dilation (41 mm on current study, 43 on prior). EF similar.  Echo 03/18/20  1. Left ventricular ejection fraction, by estimation, is 45 to 50%. The  left ventricle has mildly decreased function. Left  ventricular endocardial border not optimally defined to evaluate regional wall motion. There is moderate left ventricular hypertrophy. Left ventricular diastolic parameters are indeterminate.   2. Right ventricular systolic function is normal. The right ventricular  size is mildly enlarged.   3. Left atrial size was mildly dilated.   4. Right atrial size was mildly dilated.   5. The mitral valve is normal in structure. No evidence of mitral valve  regurgitation. No evidence of mitral stenosis.   6. The aortic valve is tricuspid. There is mild calcification of the  aortic valve. There is mild thickening of the aortic valve. Aortic valve  regurgitation is not visualized. Mild aortic valve sclerosis is present,  with no evidence of aortic valve stenosis.  7. Aortic dilatation noted. There is mild dilatation at the level of the  sinuses of Valsalva, measuring 42 mm. There is mild dilatation of the  ascending aorta, measuring 43 mm.   8. The inferior vena cava is dilated in size with <50% respiratory  variability, suggesting right atrial pressure of 15 mmHg.   Lexiscan 10/09/19 Nuclear stress EF: 41%. There was no ST segment deviation noted during stress. No T wave inversion was noted during stress. Findings consistent with prior myocardial infarction. This is an intermediate risk study. The left ventricular ejection fraction is moderately decreased (30-44%).   1. There is a medium size (10-15% of LV), fixed, moderate to severe perfusion defect present in the basal to apical inferior segments and apex consistent with prior infarction. The wall motion in this region is hypokinetic, favoring prior infarction versus diaphragm attenuation.  2. No ischemia.  3. Moderately reduced LVEF, 41%.  4. This is an intermediate risk study.   Lower Extremity Doppler 07/24/19 Right: Resting right ankle-brachial index indicates noncompressible right lower extremity arteries. The right toe-brachial index is  normal.   Left: Resting left ankle-brachial index indicates noncompressible left  lower extremity arteries. The left toe-brachial index is normal.   EKG:  EKG was not ordered today 09/05/20: atrial fibrillation 60 bpm with right bundle branch block left anterior fascicular block  Recent Labs: 04/19/2022: ALT 10; BUN 28; Creatinine, Ser 1.76; Hemoglobin 13.0; Platelets 148; Potassium 4.5; Sodium 130  Recent Lipid Panel No results found for: "CHOL", "TRIG", "HDL", "CHOLHDL", "VLDL", "LDLCALC", "LDLDIRECT"   Risk Assessment/Calculations:      Physical Exam:    VS:  BP (!) 142/82   Pulse 80   Ht 6' (1.829 m)   Wt 180 lb 9.6 oz (81.9 kg)   SpO2 97%   BMI 24.49 kg/m     Wt Readings from Last 3 Encounters:  05/07/22 180 lb 9.6 oz (81.9 kg)  04/14/22 180 lb 9.6 oz (81.9 kg)  01/05/22 182 lb 2 oz (82.6 kg)     GEN:  Well nourished, well developed in no acute distress HEENT: Normal NECK: No JVD; No carotid bruits LYMPHATICS: No lymphadenopathy CARDIAC: Irregularly irregular, no murmurs, rubs, gallops RESPIRATORY:  Clear to auscultation without rales, wheezing or rhonchi  ABDOMEN: Soft, non-tender, non-distended MUSCULOSKELETAL:  No edema; No deformity  SKIN: Warm and dry NEUROLOGIC:  Alert and oriented x 3 PSYCHIATRIC:  Normal affect   ASSESSMENT:    1. Chronic systolic heart failure (HCC)   2. Stage 3a chronic kidney disease ([place])   3. Chronic anticoagulation   4. Primary hypertension       PLAN:     Dilated aortic root (HCC) Stable on echocardiogram.  No changes.  4 to 4.5 cm sinus of Valsalva with 4.2 cm sinotubular junction.  We are trying to augment his blood pressure as best as possible.   Persistent atrial fibrillation (HCC) Doing well rate controlled.  Prior EKG showed heart rate of 60.  He is on metoprolol 100 mg twice a day.  Continue.  No changes.  No syncope.   Bifascicular block Carefully monitoring with AV nodal blocking agent for atrial  fibrillation.  His metoprolol has been decreased to 100 mg twice a day.  Heart rate is stable.  Occasionally he will still have tachycardia with activity.   Chronic kidney disease, stage III (moderate) (HCC) Continuing to be managed by nephrology.  Overall last creatinine 1.6.   HTN (hypertension) Amlodipine is stopping - falls down  with it. Metoprolol 100 BID (200 prior BID decreased because of lower heart rate). Clonidine 0.2mg  TID, 0.1 PRN    Hypercoagulable state due to atrial fibrillation (HCC) Continuing with warfarin.  Closely monitoring.  He has been on this for years.  No bleeding.  Prior INR 2.6.  Hemoglobin 14.2.  High risk medication management.   Chronic systolic heart failure (HCC) Echo 2021-EF 45%.  Unchanged from prior.  He is not on ACE inhibitor because of kidney disease.  Continue with metoprolol.  Well compensated.     Medication Adjustments/Labs and Tests Ordered: Current medicines are reviewed at length with the patient today.  Concerns regarding medicines are outlined above.  No orders of the defined types were placed in this encounter.  No orders of the defined types were placed in this encounter.   Patient Instructions  Medication Instructions:  Please discontinue your Amlodipine. Continue all other medications as listed.  *If you need a refill on your cardiac medications before your next appointment, please call your pharmacy*  Follow-Up: At [facility], you and your health needs are our priority.  As part of our continuing mission to provide you with exceptional heart care, we have created designated Provider Care Teams.  These Care Teams include your primary Cardiologist (physician) and Advanced Practice Providers (APPs -  Physician Assistants and Nurse Practitioners) who all work together to provide you with the care you need, when you need it.  We recommend signing up for the patient portal called "MyChart".  Sign up information is provided  on this After Visit Summary.  MyChart is used to connect with patients for Virtual Visits (Telemedicine).  Patients are able to view lab/test results, encounter notes, upcoming appointments, etc.  Non-urgent messages can be sent to your provider as well.   To learn more about what you can do with MyChart, go to [email or web address].    Your next appointment:   4 month(s)  Provider:   Candee Furbish, MD        Wilhemina Bonito as a scribe for Candee Furbish, MD.,have documented all relevant documentation on the behalf of Candee Furbish, MD,as directed by  Candee Furbish, MD while in the presence of Candee Furbish, MD.  I, Candee Furbish, MD, have reviewed all documentation for this visit. The documentation on 05/07/22 for the exam, diagnosis, procedures, and orders are all accurate and complete.   Signed, Candee Furbish, MD  05/07/2022 10:12 AM    [place]

## 2022-05-07 NOTE — Patient Instructions (Signed)
Medication Instructions:  Please discontinue your Amlodipine. Continue all other medications as listed.  *If you need a refill on your cardiac medications before your next appointment, please call your pharmacy*  Follow-Up: At [facility], you and your health needs are our priority.  As part of our continuing mission to provide you with exceptional heart care, we have created designated Provider Care Teams.  These Care Teams include your primary Cardiologist (physician) and Advanced Practice Providers (APPs -  Physician Assistants and Nurse Practitioners) who all work together to provide you with the care you need, when you need it.  We recommend signing up for the patient portal called "MyChart".  Sign up information is provided on this After Visit Summary.  MyChart is used to connect with patients for Virtual Visits (Telemedicine).  Patients are able to view lab/test results, encounter notes, upcoming appointments, etc.  Non-urgent messages can be sent to your provider as well.   To learn more about what you can do with MyChart, go to [email or web address].    Your next appointment:   4 month(s)  Provider:   Candee Furbish, MD

## 2022-05-18 ENCOUNTER — Other Ambulatory Visit (HOSPITAL_COMMUNITY): Payer: Medicare HMO

## 2022-05-22 ENCOUNTER — Other Ambulatory Visit (HOSPITAL_COMMUNITY): Payer: Medicare HMO

## 2022-05-26 ENCOUNTER — Ambulatory Visit: Payer: Medicare HMO | Admitting: Nurse Practitioner

## 2022-06-02 DIAGNOSIS — H26493 Other secondary cataract, bilateral: Secondary | ICD-10-CM | POA: Diagnosis not present

## 2022-06-11 ENCOUNTER — Other Ambulatory Visit: Payer: Self-pay | Admitting: Nurse Practitioner

## 2022-06-15 ENCOUNTER — Ambulatory Visit: Payer: Medicare HMO | Attending: Internal Medicine | Admitting: *Deleted

## 2022-06-15 DIAGNOSIS — Z5181 Encounter for therapeutic drug level monitoring: Secondary | ICD-10-CM | POA: Diagnosis not present

## 2022-06-15 DIAGNOSIS — I4819 Other persistent atrial fibrillation: Secondary | ICD-10-CM

## 2022-06-15 LAB — POCT INR: INR: 1.5 — AB (ref 2.0–3.0)

## 2022-06-15 MED ORDER — WARFARIN SODIUM 5 MG PO TABS: ORAL_TABLET | ORAL | 0 refills | Status: DC

## 2022-06-15 NOTE — Patient Instructions (Signed)
Description   Today and tomorrow take 1.5 tablets of warfarin then continue taking Warfarin 1 tablet daily. Recheck INR in 2 weeks (normally 6 weeks). Drinking 2 boost daily.  Call us with any medication changes or questions to [facility] # [phone].

## 2022-06-29 ENCOUNTER — Ambulatory Visit: Payer: Medicare HMO | Attending: Cardiovascular Disease | Admitting: *Deleted

## 2022-06-29 DIAGNOSIS — I4891 Unspecified atrial fibrillation: Secondary | ICD-10-CM

## 2022-06-29 DIAGNOSIS — Z5181 Encounter for therapeutic drug level monitoring: Secondary | ICD-10-CM | POA: Diagnosis not present

## 2022-06-29 LAB — POCT INR: POC INR: 1.4

## 2022-06-29 NOTE — Patient Instructions (Signed)
Description   Take 1.5 tablets of warfarin today and then Start taking warfarin 1 tablet daily except for 1.5 tablets on Tuesday and Fridays. Recheck INR in 1 week. Drinking 2 boost daily. Call us with any medication changes or questions to Cou[facility]in Clinic # [phone].

## 2022-07-07 ENCOUNTER — Ambulatory Visit: Payer: Medicare HMO | Attending: Cardiovascular Disease

## 2022-07-07 DIAGNOSIS — Z5181 Encounter for therapeutic drug level monitoring: Secondary | ICD-10-CM

## 2022-07-07 DIAGNOSIS — I4819 Other persistent atrial fibrillation: Secondary | ICD-10-CM | POA: Diagnosis not present

## 2022-07-07 LAB — POCT INR: INR: 2.9 (ref 2.0–3.0)

## 2022-07-07 NOTE — Patient Instructions (Signed)
Description   Start taking warfarin 1 tablet daily except for 1.5 tablets on Tuesdays.  Recheck INR in 3 weeks. Drinking 2 boost daily. Call us with any medication changes or questions to Coumadin Clinic # [phone].

## 2022-07-08 ENCOUNTER — Ambulatory Visit: Payer: Medicare HMO | Attending: Cardiology | Admitting: Cardiology

## 2022-07-08 ENCOUNTER — Encounter: Payer: Self-pay | Admitting: Cardiology

## 2022-07-08 VITALS — BP 152/98 | HR 63 | Ht 72.0 in | Wt 172.0 lb

## 2022-07-08 DIAGNOSIS — I10 Essential (primary) hypertension: Secondary | ICD-10-CM

## 2022-07-08 DIAGNOSIS — I4819 Other persistent atrial fibrillation: Secondary | ICD-10-CM

## 2022-07-08 DIAGNOSIS — I5022 Chronic systolic (congestive) heart failure: Secondary | ICD-10-CM | POA: Diagnosis not present

## 2022-07-08 DIAGNOSIS — I77810 Thoracic aortic ectasia: Secondary | ICD-10-CM | POA: Diagnosis not present

## 2022-07-08 MED ORDER — CLONIDINE HCL 0.3 MG PO TABS: 0.3000 mg | ORAL_TABLET | Freq: Three times a day (TID) | ORAL | 3 refills | Status: DC

## 2022-07-08 NOTE — Progress Notes (Signed)
Cardiology Office Note:    Date:  07/08/2022   ID:  Ralph Sullivan, DOB [birth year], MRN [id number]  PCP:  Ralph Cha, MD   [facility] Providers Cardiologist:  Ralph Furbish, MD     Referring MD: Ralph Sullivan,*   History of Present Illness:    Ralph Sullivan is a 77 y.o. 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  Followed closely at nephrology.  Kidney function has been stable.  At home, his blood pressure stays on average 120/60 and his heart rate ranges from 50 to 69 bpm.  At times it can be elevated.  He has been calling him frequently about elevated blood pressures.  He tried amlodipine but this did not go well with him.  He felt worse.  [place] he states.  See below for details.  He uses a walker to help with balance.  Quite a bit of knee pain at times.  Ralph Sullivan.  Hands sweating.  He denies any palpitations, chest pain, or shortness of breath, lightheadedness, headaches, syncope, orthopnea, PND, lower extremity edema or exertional symptoms.  Past Medical History:  Diagnosis Date   Atrial fibrillation ([place]) 02/08/2013   CHADS-VAS - 2 (AGE, HTN). On anticoagulation. Holter 6/14 - 2.3 sec pause. Avg HR 87. 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    Past Surgical History:  Procedure Laterality Date   CATARACT EXTRACTION     HERNIA REPAIR     INGUINAL HERNIA REPAIR  07/16/2016   INGUINAL HERNIA REPAIR Left  07/16/2016   Procedure: LAPAROSCOPIC INGUINAL HERNIA REPAIR;  Surgeon: Mickeal Skinner, MD;  Location: [facility];  Service: General;  Laterality: Left;   INSERTION OF MESH Left 07/16/2016   Procedure: INSERTION OF MESH;  Surgeon: Arta Bruce Kinsinger, MD;  Location: [facility];  Service: General;  Laterality: Left;   KIDNEY STONE SURGERY      Current Medications: Current Meds  Medication Sig   acetaminophen (TYLENOL) 325 MG suppository Place 325 mg rectally every 4 (four) hours as needed.   cloNIDine (CATAPRES) 0.3 MG tablet Take 1 tablet (0.3 mg total) by mouth 3 (three) times daily.   metoprolol tartrate (LOPRESSOR) 100 MG tablet Take 1 tablet (100 mg total) by mouth 2 (two) times daily.   Omega-3 Fatty Acids (OMEGA 3 PO) Take 1 capsule by mouth 2 (two) times daily.    vitamin E 400 UNIT capsule Take 400 Units by mouth daily.   warfarin (COUMADIN) 5 MG tablet TAKE 1 TABLET BY MOUTH ONCE DAILY OR  AS  DIRECTED  BY  THE  COUMADIN  CLINIC   [DISCONTINUED] cloNIDine (CATAPRES) 0.1 MG tablet TAKE 1 TABLET BY MOUTH IF SYSTOLIC (TOP NUMBER) GREATER THAN [id number] MMHG AND DIASTOLIC (BOTTOM NUMBER) GREATER THAN 90 MMHG   [DISCONTINUED] cloNIDine (CATAPRES) 0.2 MG tablet Take 1 tablet (0.2 mg total) by mouth 3 (three) times daily.     Allergies:   Allopurinol, Colchicine, Duloxetine,  Elemental sulfur, and Oxycontin [oxycodone hcl]   Social History   Socioeconomic History   Marital status: Married    Spouse name: Not on file   Number of children: Not on file   Years of education: Not on file   Highest education level: Not on file  Occupational History   Not on file  Tobacco Use   Smoking status: Former    Types: Cigarettes    Quit date: 03/1970    Years since quitting: 52.3   Smokeless tobacco: Never  Vaping Use   Vaping Use: Never used  Substance and Sexual Activity   Alcohol use: No    Alcohol/week: 0.0 standard drinks of alcohol   Drug use: No   Sexual activity: Not on file  Other Topics  Concern   Not on file  Social History Narrative   Not on file   Social Determinants of Health   Financial Resource Strain: Not on file  Food Insecurity: Not on file  Transportation Needs: Not on file  Physical Activity: Not on file  Stress: Not on file  Social Connections: Not on file     Family History: The patient's family history includes Diabetes in his mother; Hypertension in his mother.  ROS:   Please see the history of present illness.    (+) L knee pain  All other systems reviewed and are negative.  EKGs/Labs/Other Studies Reviewed:    Cardiac Studies & Procedures     STRESS TESTS  MYOCARDIAL PERFUSION IMAGING 10/09/2019  Narrative  Nuclear stress EF: 41%.  There was no ST segment deviation noted during stress.  No T wave inversion was noted during stress.  Findings consistent with prior myocardial infarction.  This is an intermediate risk study.  The left ventricular ejection fraction is moderately decreased (30-44%).  1. There is a medium size (10-15% of LV), fixed, moderate to severe perfusion defect present in the basal to apical inferior segments and apex consistent with prior infarction. The wall motion in this region is hypokinetic, favoring prior infarction versus diaphragm attenuation. 2. No ischemia. 3. Moderately reduced LVEF, 41%. 4. This is an intermediate risk study.   ECHOCARDIOGRAM  ECHOCARDIOGRAM COMPLETE 04/30/2022  Narrative ECHOCARDIOGRAM REPORT    [name] Date of Exam: 04/29/2022 Medical Rec #:  [id number]     Height:       73.0 in Accession #:    [id number]    Weight:       180.6 lb Date of Birth:  [birth year]      BSA:          2.060 m Patient Age:    4 years      BP:           158/90 mmHg Patient Gender: M             HR:           56 bpm. Exam Location:  Start  Procedure: 2D Echo, Cardiac Doppler and Color Doppler  Indications:    I42.0 Dilated cardiomyopathy  History:        Patient has  prior history of Echocardiogram examinations, most recent 03/18/2020. CHF, Dilated cardiomyopathy, Arrythmias:Atrial Fibrillation; Risk Factors:Dilated aortic root and Hypertension.  Sonographer:    Coralyn Helling RDCS Referring Phys: [phone] Blossom Crume C Sharaya Boruff   Sonographer Comments: Technically difficult study due to poor echo windows. IMPRESSIONS   1. Left ventricular ejection fraction, by estimation, is 45 to 50%. The left ventricle  has mildly decreased function. The left ventricle demonstrates global hypokinesis. There is mild concentric left ventricular hypertrophy. Left ventricular diastolic function could not be evaluated. 2. Right ventricular systolic function is normal. The right ventricular size is moderately enlarged. There is mildly elevated pulmonary artery systolic pressure. The estimated right ventricular systolic pressure is 123456 mmHg. 3. Left atrial size was moderately dilated. 4. Right atrial size was moderately dilated. 5. The mitral valve is normal in structure. Trivial mitral valve regurgitation. No evidence of mitral stenosis. 6. The aortic valve is tricuspid. There is mild calcification of the aortic valve. There is mild thickening of the aortic valve. Aortic valve regurgitation is trivial. Aortic valve sclerosis is present, with no evidence of aortic valve stenosis. 7. Aortic dilatation noted. There is mild dilatation of the ascending aorta, measuring 41 mm.  Comparison(s): No significant change from prior study. Mild ascending aorta dilation (41 mm on current study, 43 on prior). EF similar.  FINDINGS Left Ventricle: Left ventricular ejection fraction, by estimation, is 45 to 50%. The left ventricle has mildly decreased function. The left ventricle demonstrates global hypokinesis. The left ventricular internal cavity size was normal in size. There is mild concentric left ventricular hypertrophy. Left ventricular diastolic function could not be evaluated due to atrial  fibrillation. Left ventricular diastolic function could not be evaluated.  Right Ventricle: The right ventricular size is moderately enlarged. Right vetricular wall thickness was not well visualized. Right ventricular systolic function is normal. There is mildly elevated pulmonary artery systolic pressure. The tricuspid regurgitant velocity is 2.89 m/s, and with an assumed right atrial pressure of 8 mmHg, the estimated right ventricular systolic pressure is 123456 mmHg.  Left Atrium: Left atrial size was moderately dilated.  Right Atrium: Right atrial size was moderately dilated.  Pericardium: There is no evidence of pericardial effusion.  Mitral Valve: The mitral valve is normal in structure. There is mild thickening of the mitral valve leaflet(s). There is mild calcification of the mitral valve leaflet(s). Trivial mitral valve regurgitation. No evidence of mitral valve stenosis.  Tricuspid Valve: The tricuspid valve is normal in structure. Tricuspid valve regurgitation is trivial. No evidence of tricuspid stenosis.  Aortic Valve: The aortic valve is tricuspid. There is mild calcification of the aortic valve. There is mild thickening of the aortic valve. Aortic valve regurgitation is trivial. Aortic valve sclerosis is present, with no evidence of aortic valve stenosis.  Pulmonic Valve: The pulmonic valve was not well visualized. Pulmonic valve regurgitation is not visualized. No evidence of pulmonic stenosis.  Aorta: Aortic dilatation noted. There is mild dilatation of the ascending aorta, measuring 41 mm.  Venous: The inferior vena cava was not well visualized.  IAS/Shunts: The atrial septum is grossly normal.   LEFT VENTRICLE PLAX 2D LVIDd:         5.00 cm LVIDs:         3.60 cm LV PW:         1.30 cm LV IVS:        1.30 cm LVOT diam:     2.50 cm LV SV:         60 LV SV Index:   29 LVOT Area:     4.91 cm   RIGHT VENTRICLE RVSP:           36.4 mmHg  LEFT ATRIUM              Index        RIGHT ATRIUM  Index LA diam:        5.20 cm 2.52 cm/m   RA Pressure: 3.00 mmHg LA Vol (A2C):   97.7 ml 47.42 ml/m  RA Area:     24.60 cm LA Vol (A4C):   72.9 ml 35.38 ml/m  RA Volume:   73.60 ml  35.72 ml/m LA Biplane Vol: 89.7 ml 43.54 ml/m AORTIC VALVE LVOT Vmax:   60.38 cm/s LVOT Vmean:  43.460 cm/s LVOT VTI:    0.122 m  AORTA Ao Root diam: 3.90 cm Ao Asc diam:  4.10 cm  MITRAL VALVE               TRICUSPID VALVE MV Area (PHT): 4.24 cm    TR Peak grad:   33.4 mmHg MV Decel Time: 179 msec    TR Vmax:        289.00 cm/s MV E velocity: 73.78 cm/s  Estimated RAP:  3.00 mmHg RVSP:           36.4 mmHg  SHUNTS Systemic VTI:  0.12 m Systemic Diam: 2.50 cm  Buford Dresser MD Electronically signed by Buford Dresser MD Signature Date/Time: 04/30/2022/7:15:50 AM    Final    MONITORS  LONG TERM MONITOR (3-14 DAYS) 02/09/2019  Narrative  Atrial fibrillation with average heart rate 65 bpm.  Maximum heart rate 132 bpm transiently, minimum heart rate 39 bpm for only 2-3 beats at 11 AM.  No pauses greater than 3 seconds.  Occasional PVCs noted.  Continue with current therapy.  No changes made. Ralph Furbish, MD           EKG:   09/05/20: atrial fibrillation 60 bpm with right bundle branch block left anterior fascicular block  Recent Labs: 04/19/2022: ALT 10; BUN 28; Creatinine, Ser 1.76; Hemoglobin 13.0; Platelets 148; Potassium 4.5; Sodium 130  Recent Lipid Panel No results found for: "CHOL", "TRIG", "HDL", "CHOLHDL", "VLDL", "LDLCALC", "LDLDIRECT"   Risk Assessment/Calculations:      Physical Exam:    VS:  BP (!) 152/98 (BP Location: Left Arm, Patient Position: Sitting, Cuff Size: Normal)   Pulse 63   Ht 6' (1.829 m)   Wt 172 lb (78 kg)   SpO2 97%   BMI 23.33 kg/m     Wt Readings from Last 3 Encounters:  07/08/22 172 lb (78 kg)  05/07/22 180 lb 9.6 oz (81.9 kg)  04/14/22 180 lb 9.6 oz (81.9 kg)     GEN:  Well  nourished, well developed in no acute distress HEENT: Normal NECK: No JVD; No carotid bruits LYMPHATICS: No lymphadenopathy CARDIAC: Irregularly irregular normal rate, no murmurs, rubs, gallops RESPIRATORY:  Clear to auscultation without rales, wheezing or rhonchi  ABDOMEN: Soft, non-tender, non-distended MUSCULOSKELETAL:  No edema; No deformity  SKIN: Warm and dry NEUROLOGIC:  Alert and oriented x 3 PSYCHIATRIC:  Normal affect   ASSESSMENT:    1. Chronic systolic heart failure (HCC)   2. Persistent atrial fibrillation ([place])   3. Dilated aortic root ([place])   4. Primary hypertension        PLAN:     Dilated aortic root (HCC) Stable on echocardiogram.  No changes.  4 to 4.5 cm sinus of Valsalva with 4.2 cm sinotubular junction.  We are trying to augment his blood pressure as best as possible.   Persistent atrial fibrillation (HCC) Doing well rate controlled.  He did have an episode where he called the fire department and he was going 140 bpm.  Some days however his heart  rates are quite slow and he only takes metoprolol once.  Prior EKG showed heart rate of 60.  He is on metoprolol 100 mg twice a day.  Continue.  No changes.  No syncope.   Bifascicular block Carefully monitoring with AV nodal blocking agent for atrial fibrillation.  His metoprolol has been decreased to 100 mg twice a day.  Heart rate is stable.  Occasionally he will still have tachycardia with activity.   Chronic kidney disease, stage III (moderate) (HCC) Continuing to be managed by nephrology.  Overall last creatinine 1.6.   HTN (hypertension) Amlodipine is stopping - falls down with it. Metoprolol 100 BID (200 prior BID decreased because of lower heart rate). Change Clonidine 0.'2mg'$  up to 0.'3mg'$  TID, 0.1 PRN.  Still having some fluctuations in his blood pressure.  We are going to try the increased dose.    Hypercoagulable state due to atrial fibrillation ([place]) Continuing with warfarin.  Closely monitoring.   He has been on this for years.  No bleeding.  Prior INR 2.9.  Hemoglobin 14.2.  High risk medication management.  No bleeding episodes.   Chronic systolic heart failure (HCC) Echo 2021-EF 45%.  Unchanged from prior.  He is not on ACE inhibitor because of kidney disease.  Continue with metoprolol.  Well compensated.     Medication Adjustments/Labs and Tests Ordered: Current medicines are reviewed at length with the patient today.  Concerns regarding medicines are outlined above.  No orders of the defined types were placed in this encounter.  Meds ordered this encounter  Medications   cloNIDine (CATAPRES) 0.3 MG tablet    Sig: Take 1 tablet (0.3 mg total) by mouth 3 (three) times daily.    Dispense:  270 tablet    Refill:  3    Patient Instructions  Medication Instructions:  Your physician has recommended you make the following change in your medication:   Increase clonidine to 0.3 mg 3 times daily   *If you need a refill on your cardiac medications before your next appointment, please call your pharmacy*  Follow-Up: At [facility], you and your health needs are our priority.  As part of our continuing mission to provide you with exceptional heart care, we have created designated Provider Care Teams.  These Care Teams include your primary Cardiologist (physician) and Advanced Practice Providers (APPs -  Physician Assistants and Nurse Practitioners) who all work together to provide you with the care you need, when you need it.   Your next appointment:   3 month(s)  Provider:   Candee Furbish, MD        Signed, Ralph Furbish, MD  07/08/2022 11:07 AM    [place]

## 2022-07-08 NOTE — Patient Instructions (Signed)
Medication Instructions:  Your physician has recommended you make the following change in your medication:   Increase clonidine to 0.3 mg 3 times daily   *If you need a refill on your cardiac medications before your next appointment, please call your pharmacy*  Follow-Up: At [facility], you and your health needs are our priority.  As part of our continuing mission to provide you with exceptional heart care, we have created designated Provider Care Teams.  These Care Teams include your primary Cardiologist (physician) and Advanced Practice Providers (APPs -  Physician Assistants and Nurse Practitioners) who all work together to provide you with the care you need, when you need it.   Your next appointment:   3 month(s)  Provider:   Candee Furbish, MD

## 2022-07-14 DIAGNOSIS — I4819 Other persistent atrial fibrillation: Secondary | ICD-10-CM | POA: Diagnosis not present

## 2022-07-14 DIAGNOSIS — I5022 Chronic systolic (congestive) heart failure: Secondary | ICD-10-CM | POA: Diagnosis not present

## 2022-07-14 DIAGNOSIS — N1832 Chronic kidney disease, stage 3b: Secondary | ICD-10-CM | POA: Diagnosis not present

## 2022-07-14 DIAGNOSIS — Z0000 Encounter for general adult medical examination without abnormal findings: Secondary | ICD-10-CM | POA: Diagnosis not present

## 2022-07-14 DIAGNOSIS — I129 Hypertensive chronic kidney disease with stage 1 through stage 4 chronic kidney disease, or unspecified chronic kidney disease: Secondary | ICD-10-CM | POA: Diagnosis not present

## 2022-07-14 DIAGNOSIS — D6859 Other primary thrombophilia: Secondary | ICD-10-CM | POA: Diagnosis not present

## 2022-07-14 DIAGNOSIS — Z1331 Encounter for screening for depression: Secondary | ICD-10-CM | POA: Diagnosis not present

## 2022-07-14 DIAGNOSIS — E1169 Type 2 diabetes mellitus with other specified complication: Secondary | ICD-10-CM | POA: Diagnosis not present

## 2022-07-14 DIAGNOSIS — I10 Essential (primary) hypertension: Secondary | ICD-10-CM | POA: Diagnosis not present

## 2022-07-14 DIAGNOSIS — I119 Hypertensive heart disease without heart failure: Secondary | ICD-10-CM | POA: Diagnosis not present

## 2022-07-14 DIAGNOSIS — Z9181 History of falling: Secondary | ICD-10-CM | POA: Diagnosis not present

## 2022-07-14 DIAGNOSIS — L74519 Primary focal hyperhidrosis, unspecified: Secondary | ICD-10-CM | POA: Diagnosis not present

## 2022-07-14 DIAGNOSIS — R54 Age-related physical debility: Secondary | ICD-10-CM | POA: Diagnosis not present

## 2022-07-17 DIAGNOSIS — I4891 Unspecified atrial fibrillation: Secondary | ICD-10-CM | POA: Diagnosis not present

## 2022-07-17 DIAGNOSIS — R9431 Abnormal electrocardiogram [ECG] [EKG]: Secondary | ICD-10-CM | POA: Diagnosis not present

## 2022-07-17 DIAGNOSIS — N189 Chronic kidney disease, unspecified: Secondary | ICD-10-CM | POA: Diagnosis not present

## 2022-07-17 DIAGNOSIS — I10 Essential (primary) hypertension: Secondary | ICD-10-CM | POA: Diagnosis not present

## 2022-07-23 ENCOUNTER — Observation Stay (HOSPITAL_COMMUNITY)
Admission: EM | Admit: 2022-07-23 | Discharge: 2022-07-24 | Disposition: A | Discharge status: Home / Self Care | Payer: Medicare HMO | Attending: Family Medicine | Admitting: Family Medicine

## 2022-07-23 ENCOUNTER — Other Ambulatory Visit: Payer: Self-pay

## 2022-07-23 ENCOUNTER — Emergency Department (HOSPITAL_COMMUNITY): Payer: Medicare HMO

## 2022-07-23 ENCOUNTER — Encounter (HOSPITAL_COMMUNITY): Payer: Self-pay

## 2022-07-23 DIAGNOSIS — Z79899 Other long term (current) drug therapy: Secondary | ICD-10-CM | POA: Diagnosis not present

## 2022-07-23 DIAGNOSIS — I5022 Chronic systolic (congestive) heart failure: Secondary | ICD-10-CM | POA: Diagnosis not present

## 2022-07-23 DIAGNOSIS — I130 Hypertensive heart and chronic kidney disease with heart failure and stage 1 through stage 4 chronic kidney disease, or unspecified chronic kidney disease: Principal | ICD-10-CM | POA: Insufficient documentation

## 2022-07-23 DIAGNOSIS — I4819 Other persistent atrial fibrillation: Secondary | ICD-10-CM | POA: Diagnosis not present

## 2022-07-23 DIAGNOSIS — I110 Hypertensive heart disease with heart failure: Secondary | ICD-10-CM

## 2022-07-23 DIAGNOSIS — Z7901 Long term (current) use of anticoagulants: Secondary | ICD-10-CM | POA: Insufficient documentation

## 2022-07-23 DIAGNOSIS — R42 Dizziness and giddiness: Secondary | ICD-10-CM | POA: Diagnosis not present

## 2022-07-23 DIAGNOSIS — I10 Essential (primary) hypertension: Secondary | ICD-10-CM | POA: Diagnosis present

## 2022-07-23 DIAGNOSIS — R079 Chest pain, unspecified: Secondary | ICD-10-CM | POA: Diagnosis not present

## 2022-07-23 DIAGNOSIS — Z8673 Personal history of transient ischemic attack (TIA), and cerebral infarction without residual deficits: Secondary | ICD-10-CM | POA: Insufficient documentation

## 2022-07-23 DIAGNOSIS — N1831 Chronic kidney disease, stage 3a: Secondary | ICD-10-CM | POA: Insufficient documentation

## 2022-07-23 DIAGNOSIS — R531 Weakness: Secondary | ICD-10-CM | POA: Diagnosis not present

## 2022-07-23 DIAGNOSIS — Z743 Need for continuous supervision: Secondary | ICD-10-CM | POA: Diagnosis not present

## 2022-07-23 DIAGNOSIS — Z87891 Personal history of nicotine dependence: Secondary | ICD-10-CM | POA: Insufficient documentation

## 2022-07-23 DIAGNOSIS — N183 Chronic kidney disease, stage 3 unspecified: Secondary | ICD-10-CM | POA: Diagnosis present

## 2022-07-23 DIAGNOSIS — R0789 Other chest pain: Secondary | ICD-10-CM | POA: Diagnosis not present

## 2022-07-23 LAB — CBC WITH DIFFERENTIAL/PLATELET
Abs Immature Granulocytes: 0.02 10*3/uL (ref 0.00–0.07)
Basophils Absolute: 0.1 10*3/uL (ref 0.0–0.1)
Basophils Relative: 1 %
Eosinophils Absolute: 0.2 10*3/uL (ref 0.0–0.5)
Eosinophils Relative: 3 %
HCT: 40.2 % (ref 39.0–52.0)
Hemoglobin: 13.7 g/dL (ref 13.0–17.0)
Immature Granulocytes: 0 %
Lymphocytes Relative: 14 %
Lymphs Abs: 1 10*3/uL (ref 0.7–4.0)
MCH: 31.2 pg (ref 26.0–34.0)
MCHC: 34.1 g/dL (ref 30.0–36.0)
MCV: 91.6 fL (ref 80.0–100.0)
Monocytes Absolute: 0.7 10*3/uL (ref 0.1–1.0)
Monocytes Relative: 9 %
Neutro Abs: 5.2 10*3/uL (ref 1.7–7.7)
Neutrophils Relative %: 73 %
Platelets: 149 10*3/uL — ABNORMAL LOW (ref 150–400)
RBC: 4.39 MIL/uL (ref 4.22–5.81)
RDW: 12.7 % (ref 11.5–15.5)
WBC: 7.2 10*3/uL (ref 4.0–10.5)
nRBC: 0 % (ref 0.0–0.2)

## 2022-07-23 LAB — PROTIME-INR
INR: 2.8 — ABNORMAL HIGH (ref 0.8–1.2)
Prothrombin Time: 29.1 seconds — ABNORMAL HIGH (ref 11.4–15.2)

## 2022-07-23 LAB — BASIC METABOLIC PANEL
Anion gap: 11 (ref 5–15)
BUN: 23 mg/dL (ref 8–23)
CO2: 25 mmol/L (ref 22–32)
Calcium: 9.4 mg/dL (ref 8.9–10.3)
Chloride: 95 mmol/L — ABNORMAL LOW (ref 98–111)
Creatinine, Ser: 1.44 mg/dL — ABNORMAL HIGH (ref 0.61–1.24)
GFR, Estimated: 51 mL/min — ABNORMAL LOW (ref >60)
Glucose, Bld: 126 mg/dL — ABNORMAL HIGH (ref 70–99)
Potassium: 3.9 mmol/L (ref 3.5–5.1)
Sodium: 131 mmol/L — ABNORMAL LOW (ref 135–145)

## 2022-07-23 LAB — TROPONIN I (HIGH SENSITIVITY)
Troponin I (High Sensitivity): 13 ng/L (ref <18)
Troponin I (High Sensitivity): 15 ng/L (ref <18)

## 2022-07-23 MED ORDER — WARFARIN - PHARMACIST DOSING INPATIENT: Freq: Every day | Status: DC

## 2022-07-23 MED ORDER — CLONIDINE HCL 0.1 MG PO TABS: 0.1000 mg | ORAL_TABLET | Freq: Two times a day (BID) | ORAL | Status: DC | PRN

## 2022-07-23 MED ORDER — ACETAMINOPHEN 650 MG RE SUPP: 650.0000 mg | Freq: Four times a day (QID) | RECTAL | Status: DC | PRN

## 2022-07-23 MED ORDER — CLONIDINE HCL 0.1 MG PO TABS
0.3000 mg | ORAL_TABLET | Freq: Three times a day (TID) | ORAL | Status: DC
  Administered 2022-07-24: 0.3 mg via ORAL
  Filled 2022-07-23: qty 3

## 2022-07-23 MED ORDER — POLYVINYL ALCOHOL 1.4 % OP SOLN
1.0000 [drp] | OPHTHALMIC | Status: DC | PRN
  Administered 2022-07-23: 1 [drp] via OPHTHALMIC
  Filled 2022-07-23: qty 15

## 2022-07-23 MED ORDER — POLYETHYLENE GLYCOL 3350 17 G PO PACK: 17.0000 g | PACK | Freq: Every day | ORAL | Status: DC | PRN

## 2022-07-23 MED ORDER — CLONIDINE HCL 0.2 MG PO TABS: 0.3000 mg | ORAL_TABLET | Freq: Three times a day (TID) | ORAL | Status: DC

## 2022-07-23 MED ORDER — HYDRALAZINE HCL 20 MG/ML IJ SOLN
5.0000 mg | Freq: Once | INTRAMUSCULAR | Status: AC
  Administered 2022-07-23: 5 mg via INTRAVENOUS
  Filled 2022-07-23: qty 1

## 2022-07-23 MED ORDER — CLONIDINE HCL 0.1 MG PO TABS
0.3000 mg | ORAL_TABLET | Freq: Once | ORAL | Status: AC
  Administered 2022-07-23: 0.3 mg via ORAL
  Filled 2022-07-23: qty 3

## 2022-07-23 MED ORDER — ACETAMINOPHEN 325 MG PO TABS
650.0000 mg | ORAL_TABLET | Freq: Four times a day (QID) | ORAL | Status: DC | PRN
  Administered 2022-07-24: 650 mg via ORAL
  Filled 2022-07-23: qty 2

## 2022-07-23 MED ORDER — WARFARIN SODIUM 5 MG PO TABS
5.0000 mg | ORAL_TABLET | Freq: Once | ORAL | Status: AC
  Administered 2022-07-23: 5 mg via ORAL
  Filled 2022-07-23: qty 1

## 2022-07-23 MED ORDER — SODIUM CHLORIDE 0.9% FLUSH
3.0000 mL | Freq: Two times a day (BID) | INTRAVENOUS | Status: DC
  Administered 2022-07-23 – 2022-07-24 (×2): 3 mL via INTRAVENOUS

## 2022-07-23 NOTE — ED Notes (Addendum)
Pending cardiology plan. Pt reports seeing card PA. Daughter at [facility].

## 2022-07-23 NOTE — H&P (Addendum)
History and Physical   LOUI Sullivan [id number] DOB: Jul 09, 1945 DOA: 07/23/2022  PCP: Leeroy Cha, MD   Patient coming from: Home  Chief Complaint: Chest pain  HPI: Ralph Sullivan is a 77 y.o. male with medical history significant of A-fib, hypertension, CHF, CKD 3, anemia, stroke, dilated aortic root presenting with chest pain.  Patient has had ongoing chest tightness and hand numbness as well as some numbness around his mouth and ankle.  Numbness around the mouth and ankle has improved.  States that he typically gets the symptoms when his blood pressure is elevated.  Has had issues with blood pressure control due to his blood pressure being labile and side effects from multiple medications.  He reports some dizziness as well.  He denies fevers, chills, abdominal pain, constipation, diarrhea, nausea, vomiting, shortness of breath.  ED Course: Vital signs in the ED notable for blood pressure in the 0000000 to [id number] systolic with heart rate in the 60s to 90s.  Lab workup included BMP with sodium 131, chloride 95, creatinine stable 1.44, glucose 126.  CBC showed platelets 149.  PT and INR showed stable PT and INR 29.1 and 28 respectively.  Troponin negative x 2.  Chest x-ray showed no acute normality but low volumes.  Patient received clonidine and hydralazine in the ED.  Cardiology was consulted as his medications are managed by cardiology.  Cardiology requesting medicine admission to they can continue to adjust his blood pressure medications in a controlled environment due to history of side effects.  Review of Systems: As per HPI otherwise all other systems reviewed and are negative.  Past Medical History:  Diagnosis Date   Atrial fibrillation ([place]) 02/08/2013   CHADS-VAS - 2 (AGE, HTN). On anticoagulation. Holter 6/14 - 2.3 sec pause. Avg HR 87. 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 His smoking use included cigarettes. He has never used smokeless tobacco. He reports that he does not drink alcohol and does not use drugs.  Allergies  Allergen Reactions   Allopurinol Other (See Comments)   Colchicine Other (See Comments)   Duloxetine Other (See Comments)    More nervous   Elemental Sulfur Nausea And Vomiting   Oxycontin [Oxycodone Hcl] Nausea And Vomiting    Family History  Problem Relation Age of Onset   Hypertension Mother    Diabetes Mother   Reviewed on admission  Prior to Admission medications   Medication Sig Start Date End Date  Taking? Authorizing Provider  acetaminophen (TYLENOL) 325 MG suppository Place 325 mg rectally every 4 (four) hours as needed.   Yes [provider]  CINNAMON PO Take 1,000 mg by mouth in the morning and at bedtime.   Yes [provider]  cloNIDine (CATAPRES) 0.1 MG tablet Take 0.1 mg by mouth 2 (two) times daily as needed (BP over 90 or 140). (Take between 0.3 mg doses)   Yes [provider]  cloNIDine (CATAPRES) 0.3 MG tablet Take 1 tablet (0.3 mg  total) by mouth 3 (three) times daily. 07/08/22  Yes Jerline Pain, MD  lactose free nutrition (BOOST) LIQD Take 237 mLs by mouth 2 (two) times daily between meals.   Yes [provider]  loratadine (CLARITIN) 10 MG tablet Take 10 mg by mouth daily as needed for allergies.   Yes [provider]  Omega-3 Fatty Acids (OMEGA 3 PO) Take 1 capsule by mouth 2 (two) times daily.    Yes [provider]  vitamin E 400 UNIT capsule Take 400 Units by mouth daily.   Yes [provider]  warfarin (COUMADIN) 5 MG tablet TAKE 1 TABLET BY MOUTH ONCE DAILY OR  AS  DIRECTED  BY  THE  COUMADIN  CLINIC Patient taking differently: Take 5-7.5 mg by mouth See admin instructions. Take 5 mg daily except Tuesday take 7.5 mg 06/15/22  Yes Jerline Pain, MD  metoprolol tartrate (LOPRESSOR) 100 MG tablet Take 1 tablet (100 mg total) by mouth 2 (two) times daily. Patient not taking: Reported on 07/23/2022 04/02/22   Jerline Pain, MD    Physical Exam: Vitals:   07/23/22 1530 07/23/22 1605 07/23/22 1607 07/23/22 1608  BP: (!) 185/121 (!) 165/95  (!) 142/98  Pulse: 77   92  Resp: 15   17  Temp:    98.1 F (36.7 C)  TempSrc:    Oral  SpO2: 96%   98%  Weight:   77.1 kg   Height:   6' (1.829 m)     Physical Exam Constitutional:      General: He is not in acute distress.    Appearance: Normal appearance.  HENT:     Head: Normocephalic and atraumatic.     Mouth/Throat:     Mouth: Mucous membranes are moist.     Pharynx: Oropharynx is clear.  Eyes:     Extraocular Movements: Extraocular movements intact.     Pupils: Pupils are equal, round, and reactive to light.  Cardiovascular:     Rate and Rhythm: Normal rate. Rhythm irregular.     Pulses: Normal pulses.     Heart sounds: Normal heart sounds.  Pulmonary:     Effort: Pulmonary effort is normal. No respiratory distress.     Breath sounds: Normal breath sounds.  Abdominal:     General: Bowel sounds are normal. There is no  distension.     Palpations: Abdomen is soft.     Tenderness: There is no abdominal tenderness.  Musculoskeletal:        General: No swelling or deformity.  Skin:    General: Skin is warm and dry.  Neurological:     General: No focal deficit present.     Mental Status: Mental status is at baseline.     Labs on Admission: I have personally reviewed following labs and imaging studies  CBC: Recent Labs  Lab 07/23/22 0555  WBC 7.2  NEUTROABS 5.2  HGB 13.7  HCT 40.2  MCV 91.6  PLT 149*    Basic Metabolic Panel: Recent Labs  Lab 07/23/22 0555  NA 131*  K 3.9  CL 95*  CO2 25  GLUCOSE 126*  BUN 23  CREATININE 1.44*  CALCIUM 9.4    GFR: Estimated Creatinine Clearance: 49.1 mL/min (A) (by C-G formula based on SCr of 1.44 mg/dL (H)).  Liver Function Tests: No results for input(s): "AST", "ALT", "ALKPHOS", "BILITOT", "PROT", "ALBUMIN" in the last 168 hours.  Urine analysis:    Component Value Date/Time   COLORURINE YELLOW 04/01/2017 1703   APPEARANCEUR HAZY (A) 04/01/2017 1703   LABSPEC 1.016 04/01/2017 1703   PHURINE 5.0 04/01/2017 1703   GLUCOSEU NEGATIVE 04/01/2017 1703   HGBUR SMALL (A) 04/01/2017 1703   BILIRUBINUR NEGATIVE 04/01/2017 1703   KETONESUR 5 (A) 04/01/2017 1703   PROTEINUR 100 (A) 04/01/2017 1703   NITRITE POSITIVE (A) 04/01/2017 1703   LEUKOCYTESUR MODERATE (A) 04/01/2017 1703    Radiological Exams on Admission: DG Chest Port 1 View  Result Date: 07/23/2022 CLINICAL DATA:  Chest pain EXAM: PORTABLE CHEST 1 VIEW COMPARISON:  04/01/2017 FINDINGS: Normal heart size. Stable aortic tortuosity. There is no edema, consolidation, effusion, or pneumothorax. No acute osseous finding IMPRESSION: Low volume chest without acute finding. Electronically Signed   By: Jorje Guild M.D.   On: 07/23/2022 06:17    EKG: Independently reviewed.  Atrial flutter with right bundle branch block and left anterior fascicular block.  64  bpm.  Assessment/Plan Principal Problem:   HTN (hypertension) Active Problems:   Persistent atrial fibrillation (HCC)   Chronic kidney disease, stage III (moderate) (HCC)   Chronic systolic heart failure (HCC)   Chest pain   Hypertension Chest pain > Patient presenting with chest tightness and hand numbness which he gets typically with his blood pressure being elevated.  Troponin negative x 2.  Has had some dizziness as well. > Currently on clonidine only for blood pressure control has been held off beta-blockers due to his fascicular block as well as history of falls on amlodipine.  IV hydralazine tried in the ED but did have flushing. > Despite blood pressure not being severely elevated cardiology requesting admission so they continue to adjust his blood pressure medication in a controlled environment due to his history of side effects. - Monitor on med-surg overnight - Continue with home scheduled and as needed clonidine - Further management per cardiology recommendations  Afib - Continue Warfarin  CHF > Last echo with EF 45-50%, indeterminate diastolic function, normal RV function. - Not currently on any diuretics  CKD 3 > Creatinine stable at 1.44 in the ED. - Continue to monitor  DVT prophylaxis: Warfarin Code Status:   Full  Family Communication:  Updated at bedside  Disposition Plan:   Patient is from:  Home   Anticipated DC to:  Home   Anticipated DC date:  0-1 days  Anticipated DC barriers: None  Consults called:  Cardiology  Admission status:  Observation, Med-surg  Severity of Illness: The appropriate patient status for this patient is OBSERVATION. Observation status is judged to be reasonable and necessary in order to provide the required intensity of service to ensure the patient's safety. The patient's presenting symptoms, physical exam findings, and initial radiographic and laboratory data in the context of their medical condition is felt to place them at  decreased risk for further clinical deterioration. Furthermore, it is anticipated that the patient will be medically stable for discharge from the hospital within 2 midnights of admission.  Marcelyn Bruins MD [facility]  How to contact the [facility] Attending or Consulting provider [place] or covering provider during after hours [place], for this patient?   Check the care team in [facility] and look for a) attending/consulting TR[facility] provider listed and b) the [facility] team listed Log into [email or web address] and use [place]'s universal password to access. If you do not have the password, please contact the hospital operator. Locate the [facility] provider you are looking for under [facility] and page to a number that you can be directly reached. If you still have difficulty reaching the provider, please page the [facility] (Director on Call) for the Hospitalists listed on amion for assistance.  07/23/2022, 5:21 PM

## 2022-07-23 NOTE — ED Notes (Signed)
BP 158/100  [employer]

## 2022-07-23 NOTE — ED Triage Notes (Signed)
EMS gave 324 ASA en route

## 2022-07-23 NOTE — ED Triage Notes (Signed)
Pt to ED via EMS home. Pt states he woke up at 3:30am c/o bilateral arm numbness, dizziness, faint, and facial numbness. Pt has hx of TIAs. Pt denies numbness / tingling upon EMS arrival. Pt also c/o chest pain x a few weeks but worse today. Pt has hx of a fib.    EMS Vitals: 134/88 88 HR 122 CBG 96% RA 18 R wrist

## 2022-07-23 NOTE — ED Notes (Signed)
ED TO INPATIENT HANDOFF REPORT  ED Nurse Name and Phone #:   S Name/Age/Gender Ralph Sullivan 77 y.o. male Room/Bed: 015C/015C  Code Status   Code Status: Full Code  Home/SNF/Other Home Patient oriented to: situation Is this baseline? Yes   Triage Complete: Triage complete  Chief Complaint HTN (hypertension) [I10]  Triage Note Pt to ED via EMS home. Pt states he woke up at 3:30am c/o bilateral arm numbness, dizziness, faint, and facial numbness. Pt has hx of TIAs. Pt denies numbness / tingling upon EMS arrival. Pt also c/o chest pain x a few weeks but worse today. Pt has hx of a fib.    EMS Vitals: 134/88 88 HR 122 CBG 96% RA 18 R wrist  EMS gave 324 ASA en route   Allergies Allergies  Allergen Reactions   Allopurinol Other (See Comments)   Colchicine Other (See Comments)   Duloxetine Other (See Comments)    More nervous   Elemental Sulfur Nausea And Vomiting   Oxycontin [Oxycodone Hcl] Nausea And Vomiting    Level of Care/Admitting Diagnosis ED Disposition     ED Disposition  Admit   Condition  --   [place]: [place] [[id number]]  Level of Care: Med-Surg [16]  May place patient in observation at [facility] or [place] if equivalent level of care is available:: No  Covid Evaluation: Asymptomatic - no recent exposure (last 10 days) testing not required  Diagnosis: HTN (hypertension) [id number]  Admitting Physician: Marcelyn Bruins [id number]  Attending Physician: Marcelyn Bruins [id number]          B Medical/Surgery History Past Medical History:  Diagnosis Date   Atrial fibrillation ([place]) 02/08/2013   CHADS-VAS - 2 (AGE, HTN). On anticoagulation. Holter 6/14 - 2.3 sec pause. Avg HR 87. 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   Past Surgical History:  Procedure Laterality Date   CATARACT EXTRACTION     HERNIA REPAIR     INGUINAL HERNIA REPAIR  07/16/2016   INGUINAL HERNIA REPAIR Left 07/16/2016   Procedure: LAPAROSCOPIC INGUINAL HERNIA REPAIR;  Surgeon: Mickeal Skinner, MD;  Location: [place];  Service: General;  Laterality: Left;   INSERTION OF MESH Left 07/16/2016   Procedure: INSERTION OF MESH;  Surgeon: Mickeal Skinner, MD;  Location: [place];  Service: General;  Laterality: Left;   KIDNEY STONE SURGERY       A IV Location/Drains/Wounds Patient Lines/Drains/Airways Status     Active Line/Drains/Airways     Name Placement date Placement time Site Days   Peripheral IV 07/23/22 18 G Posterior;Right Forearm 07/23/22  0555  Forearm  less than 1   Incision - 3 Ports Abdomen 1: Left;Mid 2: Right;Mid 3: Right;Lateral 07/16/16  1229  -- 2198            Intake/Output Last 24 hours No intake or output data in the 24 hours ending 07/23/22 1800  Labs/Imaging Results for orders placed or performed during the hospital encounter of 07/23/22 (from the past 48 hour(s))  CBC with Differential/Platelet  Status: Abnormal   Collection Time: 07/23/22  5:55 AM  Result Value Ref Range   WBC 7.2 4.0 - 10.5 K/uL   RBC 4.39 4.22 - 5.81 MIL/uL   Hemoglobin 13.7 13.0 - 17.0 g/dL   HCT 40.2 39.0 - 52.0 %   MCV 91.6 80.0 - 100.0 fL   MCH 31.2 26.0 - 34.0 pg   MCHC 34.1 30.0 - 36.0 g/dL   RDW 12.7 11.5 - 15.5 %   Platelets 149 (L) 150 - 400 K/uL   nRBC 0.0 0.0 - 0.2 %   Neutrophils Relative % 73 %   Neutro Abs 5.2 1.7 - 7.7 K/uL   Lymphocytes Relative 14 %   Lymphs Abs 1.0 0.7 - 4.0 K/uL   Monocytes Relative 9 %   Monocytes Absolute 0.7 0.1 - 1.0 K/uL   Eosinophils Relative 3 %   Eosinophils  Absolute 0.2 0.0 - 0.5 K/uL   Basophils Relative 1 %   Basophils Absolute 0.1 0.0 - 0.1 K/uL   Immature Granulocytes 0 %   Abs Immature Granulocytes 0.02 0.00 - 0.07 K/uL    Comment: Performed at [facility] [address], [place], [place] [id number]  Basic metabolic panel     Status: Abnormal   Collection Time: 07/23/22  5:55 AM  Result Value Ref Range   Sodium 131 (L) 135 - 145 mmol/L   Potassium 3.9 3.5 - 5.1 mmol/L   Chloride 95 (L) 98 - 111 mmol/L   CO2 25 22 - 32 mmol/L   Glucose, Bld 126 (H) 70 - 99 mg/dL    Comment: Glucose reference range applies only to samples taken after fasting for at least 8 hours.   BUN 23 8 - 23 mg/dL   Creatinine, Ser 1.44 (H) 0.61 - 1.24 mg/dL   Calcium 9.4 8.9 - 10.3 mg/dL   GFR, Estimated 51 (L) >60 mL/min    Comment: (NOTE) Calculated using the CKD-EPI Creatinine Equation (2021)    Anion gap 11 5 - 15    Comment: Performed at [facility] [address], [place], [place] [address]  Troponin I (High Sensitivity)     Status: None   Collection Time: 07/23/22  5:55 AM  Result Value Ref Range   Troponin I (High Sensitivity) 13 <18 ng/L    Comment: (NOTE) Elevated high sensitivity troponin I (hsTnI) values and significant  changes across serial measurements may suggest ACS but many other  chronic and acute conditions are known to elevate hsTnI results.  Refer to the "Links" section for chest pain algorithms and additional  guidance. Performed at [facility], [place] [address], [place], [place] [address]   Protime-INR     Status: Abnormal   Collection Time: 07/23/22  5:55 AM  Result Value Ref Range   Prothrombin Time 29.1 (H) 11.4 - 15.2 seconds   INR 2.8 (H) 0.8 - 1.2    Comment: (NOTE) INR goal varies based on device and disease states. Performed at [facility], [place] [address], [place], [place] [address]   Troponin I (High Sensitivity)     Status: None   Collection Time: 07/23/22  8:00 AM  Result Value Ref  Range   Troponin I (High Sensitivity) 15 <18 ng/L    Comment: (NOTE) Elevated high sensitivity troponin I (hsTnI) values and significant  changes across serial measurements may suggest ACS but many other  chronic and acute conditions are known to elevate hsTnI results.  Refer  to the "Links" section for chest pain algorithms and additional  guidance. Performed at [facility], [place] [address] [place], [place] [address]    DG Chest Port 1 View  Result Date: 07/23/2022 CLINICAL DATA:  Chest pain EXAM: PORTABLE CHEST 1 VIEW COMPARISON:  04/01/2017 FINDINGS: Normal heart size. Stable aortic tortuosity. There is no edema, consolidation, effusion, or pneumothorax. No acute osseous finding IMPRESSION: Low volume chest without acute finding. Electronically Signed   By: Jorje Guild M.D.   On: 07/23/2022 06:17    Pending Labs Unresulted Labs (From admission, onward)     Start     Ordered   07/24/22 [id number]  Basic metabolic panel  Tomorrow morning,   R        07/23/22 1720   07/24/22 0500  Protime-INR  Daily,   R      07/23/22 1728            Vitals/Pain Today's Vitals   07/23/22 1605 07/23/22 1607 07/23/22 1608 07/23/22 1750  BP: (!) 165/95  (!) 142/98 (!) 152/95  Pulse:   92 (!) 107  Resp:   17 19  Temp:   98.1 F (36.7 C) 98 F (36.7 C)  TempSrc:   Oral Oral  SpO2:   98% 96%  Weight:  170 lb (77.1 kg)    Height:  6' (1.829 m)    PainSc:   2      Isolation Precautions No active isolations  Medications Medications  cloNIDine (CATAPRES) tablet 0.3 mg (has no administration in time range)  cloNIDine (CATAPRES) tablet 0.3 mg (has no administration in time range)  cloNIDine (CATAPRES) tablet 0.1 mg (has no administration in time range)  sodium chloride flush (NS) 0.9 % injection 3 mL (has no administration in time range)  acetaminophen (TYLENOL) tablet 650 mg (has no administration in time range)    Or  acetaminophen (TYLENOL) suppository 650 mg (has no  administration in time range)  polyethylene glycol (MIRALAX / GLYCOLAX) packet 17 g (has no administration in time range)  warfarin (COUMADIN) tablet 5 mg (has no administration in time range)  Warfarin - Pharmacist Dosing Inpatient (has no administration in time range)  hydrALAZINE (APRESOLINE) injection 5 mg (5 mg Intravenous Given 07/23/22 1605)    Mobility walks     Focused Assessments Cardiac Assessment Handoff:  Cardiac Rhythm:  (HR 74) Lab Results  Component Value Date   CKTOTAL 114 11/12/2015   CKMB 7.2 (H) 10/15/2015   TROPONINI 0.79 (HH) 10/15/2015   No results found for: "DDIMER" Does the Patient currently have chest pain? No    R Recommendations: See Admitting Provider Note  Report given to:   Additional Notes:

## 2022-07-23 NOTE — ED Provider Notes (Signed)
Patient initially seen by Dr. Waverly Ferrari.  Please see his note.  Plan was to follow-up on the delta troponin.  Troponins are normal.  Patient has been having trouble with chest pain in the setting of labile blood pressure.  Does have known history of coronary artery disease.  Will discuss with cardiology for further recommendations.  Case discussed with Trish.  Cardiology will evaluate pt in ED for further recommendations.   Dorie Rank, MD 07/23/22 [phone]

## 2022-07-23 NOTE — ED Notes (Signed)
BP 166/109

## 2022-07-23 NOTE — Consult Note (Signed)
Cardiology Consultation   Patient ID: Ralph Sullivan MRN: [id number]; DOB: 23-Mar-1946  Admit date: 07/23/2022 Date of Consult: 07/23/2022  PCP:  Leeroy Cha, [facility] Cardiologist:  Candee Furbish, MD   {    Patient Profile:   Ralph Sullivan is a 77 y.o. 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 Tomi Bamberger.  History of Present Illness:   [name] has cardiac history significant for systolic heart failure that appears to be stable and compensated with an EF of 45 to 50%.  In 2019 patient has EF as low as 25% with no obvious etiology and normalization of EF 5 months later after being started on Entresto. Unclear if this was suspected to be due to afib RVR or some other underlying cause. He also has had a myoperfusion Lexiscan completed in 2021 that noted findings consistent with a previous myocardial infarction due to defect present in the basal to apical inferior segments and apex, as well as wall motion hypokinesis.  Dr. Marlou Porch at that time decided to medically manage and would consider heart catheterization if chest pain were to continue. Also, has history of A-fib that is rate controlled on lopressor 100mg  BID and warfarin. Overall, he has stable cardiac disease and has had close monitoring with minimal complaints.  Cardiology was initially consulted due to primary concerns of chest pain.  This workup has been largely negative and after discussion with patient, who is a poor historian, it seems his primary concern is increasing symptoms of numbness and tingling that he associates with his blood pressure being elevated.  He states that since December he has had worsening numbness in his hands.  He also states that somebody discontinued his allopurinol approximately 6 months ago.  Patient does relate his numbness  and tingling to being similar to his bouts of gout.  Patient does admit to chronic pain chest pain however he does not believe it to be any different than his normal intermittent chest pain.  He denies any shortness of breath, nausea, radiating pain, sweatiness with these anginal symptoms.  Patient also voices concerns about his blood pressure being elevated and is currently being managed on metoprolol and clonidine.  He seems very anxious about this, but his readings have been around 130-145 throughout his admission.   EKG workup: EKG showing known atrial fibrillation with right bundle branch block and left anterior fascicular block.  Heart rate 62.  Trops negative, chest x-ray negative.  Labs indicating mild hyponatremia, 131.    Past Medical History:  Diagnosis Date   Atrial fibrillation ([place]) 02/08/2013   CHADS-VAS - 2 (AGE, HTN). On anticoagulation. Holter 6/14 - 2.3 sec pause. Avg HR 87. 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/15/2014 Lacunar    Past Surgical History:  Procedure Laterality Date   CATARACT EXTRACTION     HERNIA REPAIR     INGUINAL HERNIA REPAIR  07/16/2016   INGUINAL  HERNIA REPAIR Left 07/16/2016   Procedure: LAPAROSCOPIC INGUINAL HERNIA REPAIR;  Surgeon: Mickeal Skinner, MD;  Location: [facility];  Service: General;  Laterality: Left;   INSERTION OF MESH Left 07/16/2016   Procedure: INSERTION OF MESH;  Surgeon: Mickeal Skinner, MD;  Location: [facility];  Service: General;  Laterality: Left;   KIDNEY STONE SURGERY      Inpatient Medications: Scheduled Meds:  Continuous Infusions:  PRN  Meds:   Allergies:    Allergies  Allergen Reactions   Allopurinol Other (See Comments)   Colchicine Other (See Comments)   Duloxetine Other (See Comments)    More nervous   Elemental Sulfur Nausea And Vomiting   Oxycontin [Oxycodone Hcl] Nausea And Vomiting    Social History:   Social History   Socioeconomic History   Marital status: Married    Spouse name: Not on file   Number of children: Not on file   Years of education: Not on file   Highest education level: Not on file  Occupational History   Not on file  Tobacco Use   Smoking status: Former    Types: Cigarettes    Quit date: 03/1970    Years since quitting: 52.3   Smokeless tobacco: Never  Vaping Use   Vaping Use: Never used  Substance and Sexual Activity   Alcohol use: No    Alcohol/week: 0.0 standard drinks of alcohol   Drug use: No   Sexual activity: Not on file  Other Topics Concern   Not on file  Social History Narrative   Not on file   Social Determinants of Health   Financial Resource Strain: Not on file  Food Insecurity: Not on file  Transportation Needs: Not on file  Physical Activity: Not on file  Stress: Not on file  Social Connections: Not on file  Intimate Partner Violence: Not on file    Family History:   Family History  Problem Relation Age of Onset   Hypertension Mother    Diabetes Mother      ROS:  Please see the history of present illness.  All other ROS reviewed and negative.     Physical Exam/Data:   Vitals:   07/23/22 0745 07/23/22 0815 07/23/22 0900 07/23/22 0919  BP: 130/80 121/83 (!) 145/72   Pulse: (!) 33 (!) 37 65   Resp: 12 14 20    Temp:    98 F (36.7 C)  TempSrc:    Oral  SpO2: 91% 98% 98%    No intake or output data in the 24 hours ending 07/23/22 1140    07/08/2022   10:07 AM 05/07/2022    9:41 AM 04/14/2022    1:50 PM  Last 3 Weights  Weight (lbs) 172 lb 180 lb 9.6 oz 180 lb 9.6 oz  Weight (kg) 78.019 kg 81.92 kg 81.92 kg     There is no height  or weight on file to calculate BMI.  General:  Well nourished, well developed, in no acute distress HEENT: normal Neck: no JVD Vascular: No carotid bruits; Distal pulses 2+ bilaterally Cardiac: Irregularly irregular Lungs:  clear to auscultation bilaterally, no wheezing, rhonchi or rales  Abd: soft, nontender, no hepatomegaly  Ext: no edema Musculoskeletal:  No deformities, BUE and BLE strength normal and equal Skin: warm and dry  Neuro:  CNs 2-12 intact, no focal abnormalities noted Psych:  Normal affect   EKG:  The EKG was personally reviewed and demonstrates: Atrial fibrillation with right bundle branch block and  left anterior fascicular block with a heart rate of 64 Telemetry:  Telemetry was personally reviewed and demonstrates: Atrial fibrillation with a heart rate of 62  Relevant CV Studies: Echocardiogram 04/29/2022 1. Left ventricular ejection fraction, by estimation, is 45 to 50% . The left ventricle has mildly decreased function. The left ventricle demonstrates global hypokinesis. There is mild concentric left ventricular hypertrophy. Left ventricular diastolic function could not be evaluated. 2. Right ventricular systolic function is normal. The right ventricular size is moderately enlarged. There is mildly elevated pulmonary artery systolic pressure. The estimated right ventricular systolic pressure is 41. 4 mmHg. 3. Left atrial size was moderately dilated. 4. Right atrial size was moderately dilated. 5. The mitral valve is normal in structure. Trivial mitral valve regurgitation. No evidence of mitral stenosis. 6. The aortic valve is tricuspid. There is mild calcification of the aortic valve. There is mild thickening of the aortic valve. Aortic valve regurgitation is trivial. Aortic valve sclerosis is present, with no evidence of aortic valve stenosis. 7. Aortic dilatation noted. There is mild dilatation of the ascending aorta, measuring 41 mm.  Lexiscan 10/09/2019 Nuclear stress EF:  41%. There was no ST segment deviation noted during stress. No T wave inversion was noted during stress. Findings consistent with prior myocardial infarction. This is an intermediate risk study. The left ventricular ejection fraction is moderately decreased (30-44%).   1. There is a medium size (10-15% of LV), fixed, moderate to severe perfusion defect present in the basal to apical inferior segments and apex consistent with prior infarction. The wall motion in this region is hypokinetic, favoring prior infarction versus diaphragm attenuation.  2. No ischemia.  3. Moderately reduced LVEF, 41%.  4. This is an intermediate risk study.  Laboratory Data:  High Sensitivity Troponin:   Recent Labs  Lab 07/23/22 0555 07/23/22 0800  TROPONINIHS 13 15     Chemistry Recent Labs  Lab 07/23/22 0555  NA 131*  K 3.9  CL 95*  CO2 25  GLUCOSE 126*  BUN 23  CREATININE 1.44*  CALCIUM 9.4  GFRNONAA 51*  ANIONGAP 11    No results for input(s): "PROT", "ALBUMIN", "AST", "ALT", "ALKPHOS", "BILITOT" in the last 168 hours. Lipids No results for input(s): "CHOL", "TRIG", "HDL", "LABVLDL", "LDLCALC", "CHOLHDL" in the last 168 hours.  Hematology Recent Labs  Lab 07/23/22 0555  WBC 7.2  RBC 4.39  HGB 13.7  HCT 40.2  MCV 91.6  MCH 31.2  MCHC 34.1  RDW 12.7  PLT 149*   Thyroid No results for input(s): "TSH", "FREET4" in the last 168 hours.  BNPNo results for input(s): "BNP", "PROBNP" in the last 168 hours.  DDimer No results for input(s): "DDIMER" in the last 168 hours.   Radiology/Studies:  DG Chest Port 1 View  Result Date: 07/23/2022 CLINICAL DATA:  Chest pain EXAM: PORTABLE CHEST 1 VIEW COMPARISON:  04/01/2017 FINDINGS: Normal heart size. Stable aortic tortuosity. There is no edema, consolidation, effusion, or pneumothorax. No acute osseous finding IMPRESSION: Low volume chest without acute finding. Electronically Signed   By: Jorje Guild M.D.   On: 07/23/2022 06:17      Assessment and Plan:   Chest pain  Patient has chronic chest pain that he feels is not out of proportion or associated with any new symptoms.  It appears his primary concern is numbness and tingling in his hands that has been worsening since December.  Given negative EKG findings for ischemic disease, negative troponins, and current presentation I do not  think there is evidence of an acute cardiac event. I feel this is stable angina and further management should be directed towards his possible gout flare or peripheral neuropathy. Can consider imdur for chronic chest pain. Would recommend heart catherization at some point to investigate coronary anatomy. He's had chronic pain with no ischemic evaluation besides a lexiscan in 2021, perhaps due to poor renal function during that time. Renal function appears to be stable now and permissible for contrast with renal precautions.   Chronic systolic heart failure Patient has close follow-up with Dr. Marlou Porch and an EF of 45 to 50% which appears to be stable and well compensated.  In 2019 patient had EF as as low as 25% with no obvious cause. He is euvolemic on exam and denies any complaints of peripheral edema or showing signs of pulmonary congestion.  CXR neg. Would not aggressively pursue BP due to high fall risk and on blood thinner. BP seems reasonable considering age.  Continue Lopressor 100 mg tablets twice a day Unable to take amlodipine due to previous falls Consider starting SGLT2i  HTN See above Continue lopressor 100mg  BID and clonidine .3mg  TID  Persistent atrial fibrillation Patient generally well rate controlled on Lopressor 100 mg twice a day with some episodes going above 100.  However, resting heart rate appears to be around 60. Continue with warfarin 5mg  tablet with close monitoring of PT/INR Patient is a high risk for falls  CKD Creatinine appears to be close to baseline around 1.5  Dilated aortic root  Stable on  echocardiogram.   4 to 4.5 cm sinus of Valsalva with 4.2 cm sinotubular junction   Risk Assessment/Risk Scores:    CHA2DS2-VASc Score = 6  : This indicates a 9.7% annual risk of stroke. The patient's score is based upon: CHF History: 1 HTN History: 1 Diabetes History: 0 Stroke History: 2 Vascular Disease History: 1 Age Score: 1 Gender Score: 0  For questions or updates, please contact [place] Please consult [email or web address] for contact info under    Signed, Bonnee Quin, PA-C  07/23/2022 11:40 AM

## 2022-07-23 NOTE — Progress Notes (Signed)
ANTICOAGULATION CONSULT NOTE - Initial Consult  Pharmacy Consult for warfarin  Indication: atrial fibrillation  Allergies  Allergen Reactions   Allopurinol Other (See Comments)   Colchicine Other (See Comments)   Duloxetine Other (See Comments)    More nervous   Elemental Sulfur Nausea And Vomiting   Oxycontin [Oxycodone Hcl] Nausea And Vomiting    Patient Measurements: Height: 6' (182.9 cm) Weight: 77.1 kg (170 lb) IBW/kg (Calculated) : 77.6  Vital Signs: Temp: 98.1 F (36.7 C) (03/28 1608) Temp Source: Oral (03/28 1608) BP: 142/98 (03/28 1608) Pulse Rate: 92 (03/28 1608)  Labs: Recent Labs    07/23/22 0555 07/23/22 0800  HGB 13.7  --   HCT 40.2  --   PLT 149*  --   LABPROT 29.1*  --   INR 2.8*  --   CREATININE 1.44*  --   TROPONINIHS 13 15    Estimated Creatinine Clearance: 49.1 mL/min (A) (by C-G formula based on SCr of 1.44 mg/dL (H)).   Medical History: Past Medical History:  Diagnosis Date   Atrial fibrillation ([place]) 02/08/2013   CHADS-VAS - 2 (AGE, HTN). On anticoagulation. Holter 6/14 - 2.3 sec pause. Avg HR 87. 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 Patient on warfarin PTA, taking 7.5mg  on Tuesday and 5mg  all other days. Last dose on 3/27 AM. INR 2.8 this moring. Pharmacy consulted to dose warfarin.   Goal of Therapy:  INR 2-3 Monitor platelets by anticoagulation protocol: Yes   Plan:  Warfarin 5mg  x 1 per home regimen.  Daily INR.   Esmeralda Arthur, PharmD, BCCCP   07/23/2022,5:25 PM

## 2022-07-23 NOTE — ED Provider Notes (Signed)
[facility] Provider Note   CSN: [id number] Arrival date & time: 07/23/22  0546     History  Chief Complaint  Patient presents with   Chest Pain    Ralph Sullivan is a 77 y.o. male.  Patient presents to the emergency department for evaluation of chest pain.  Patient reports that he has a tightness in his chest and he has been experiencing numbness in both of his hands.  He also reports that he had some numbness around his mouth and in his ankles earlier but that has improved.  Patient reports that his cardiologist has been adjusting his medications for elevated blood pressure.  He reports this numbness and tingling occurs when his blood pressure goes up.  He checks his blood pressure multiple times a day.       Home Medications Prior to Admission medications   Medication Sig Start Date End Date Taking? Authorizing Provider  acetaminophen (TYLENOL) 325 MG suppository Place 325 mg rectally every 4 (four) hours as needed.    [provider]  cloNIDine (CATAPRES) 0.3 MG tablet Take 1 tablet (0.3 mg total) by mouth 3 (three) times daily. 07/08/22   Jerline Pain, MD  metoprolol tartrate (LOPRESSOR) 100 MG tablet Take 1 tablet (100 mg total) by mouth 2 (two) times daily. 04/02/22   Jerline Pain, MD  Omega-3 Fatty Acids (OMEGA 3 PO) Take 1 capsule by mouth 2 (two) times daily.     [provider]  vitamin E 400 UNIT capsule Take 400 Units by mouth daily.    [provider]  warfarin (COUMADIN) 5 MG tablet TAKE 1 TABLET BY MOUTH ONCE DAILY OR  AS  DIRECTED  BY  THE  COUMADIN  CLINIC 06/15/22   Jerline Pain, MD      Allergies    Allopurinol, Colchicine, Duloxetine, Elemental sulfur, and Oxycontin [oxycodone hcl]    Review of Systems   Review of Systems  Physical Exam Updated Vital Signs BP 129/87   Pulse 64   Temp 98.1 F (36.7 C) (Oral)   Resp 16   SpO2 100%  Physical Exam Vitals and nursing note  reviewed.  Constitutional:      General: He is not in acute distress.    Appearance: He is well-developed.  HENT:     Head: Normocephalic and atraumatic.     Mouth/Throat:     Mouth: Mucous membranes are moist.  Eyes:     General: Vision grossly intact. Gaze aligned appropriately.     Extraocular Movements: Extraocular movements intact.     Conjunctiva/sclera: Conjunctivae normal.  Cardiovascular:     Rate and Rhythm: Normal rate. Rhythm irregular.     Pulses: Normal pulses.     Heart sounds: Normal heart sounds, S1 normal and S2 normal. No murmur heard.    No friction rub. No gallop.  Pulmonary:     Effort: Pulmonary effort is normal. No respiratory distress.     Breath sounds: Normal breath sounds.  Abdominal:     Palpations: Abdomen is soft.     Tenderness: There is no abdominal tenderness. There is no guarding or rebound.     Hernia: No hernia is present.  Musculoskeletal:        General: No swelling.     Cervical back: Full passive range of motion without pain, normal range of motion and neck supple. No pain with movement, spinous process tenderness or muscular tenderness. Normal range of  motion.     Right lower leg: No edema.     Left lower leg: No edema.  Skin:    General: Skin is warm and dry.     Capillary Refill: Capillary refill takes less than 2 seconds.     Findings: No ecchymosis, erythema, lesion or wound.  Neurological:     Mental Status: He is alert and oriented to person, place, and time.     GCS: GCS eye subscore is 4. GCS verbal subscore is 5. GCS motor subscore is 6.     Cranial Nerves: Cranial nerves 2-12 are intact.     Sensory: Sensation is intact.     Motor: Motor function is intact. No weakness or abnormal muscle tone.     Coordination: Coordination is intact.  Psychiatric:        Mood and Affect: Mood normal.        Speech: Speech normal.        Behavior: Behavior normal.     ED Results / Procedures / Treatments   Labs (all labs ordered are  listed, but only abnormal results are displayed) Labs Reviewed  CBC WITH DIFFERENTIAL/PLATELET - Abnormal; Notable for the following components:      Result Value   Platelets 149 (*)    All other components within normal limits  BASIC METABOLIC PANEL - Abnormal; Notable for the following components:   Sodium 131 (*)    Chloride 95 (*)    Glucose, Bld 126 (*)    Creatinine, Ser 1.44 (*)    GFR, Estimated 51 (*)    All other components within normal limits  PROTIME-INR - Abnormal; Notable for the following components:   Prothrombin Time 29.1 (*)    INR 2.8 (*)    All other components within normal limits  TROPONIN I (HIGH SENSITIVITY)    EKG EKG Interpretation  Date/Time:  Thursday July 23 2022 05:52:00 EDT Ventricular Rate:  64 PR Interval:    QRS Duration: 177 QT Interval:  446 QTC Calculation: 461 R Axis:   -78 Text Interpretation: Atrial flutter RBBB and LAFB Probable left ventricular hypertrophy No significant change since last tracing Confirmed by Orpah Greek [id number]) on 07/23/2022 5:54:22 AM  Radiology DG Chest Port 1 View  Result Date: 07/23/2022 CLINICAL DATA:  Chest pain EXAM: PORTABLE CHEST 1 VIEW COMPARISON:  04/01/2017 FINDINGS: Normal heart size. Stable aortic tortuosity. There is no edema, consolidation, effusion, or pneumothorax. No acute osseous finding IMPRESSION: Low volume chest without acute finding. Electronically Signed   By: Jorje Guild M.D.   On: 07/23/2022 06:17    Procedures Procedures    Medications Ordered in ED Medications - No data to display  ED Course/ Medical Decision Making/ A&P                             Medical Decision Making Amount and/or Complexity of Data Reviewed External Data Reviewed: labs, radiology, ECG and notes. Labs: ordered. Decision-making details documented in ED Course. Radiology: ordered and independent interpretation performed. Decision-making details documented in ED Course. ECG/medicine tests:  ordered and independent interpretation performed. Decision-making details documented in ED Course.   Patient presents to the emergency department for evaluation of chest pain.  Patient indicates left-sided chest pain that has been persistent.  No associated shortness of breath.  Differential Diagnosis considered includes, but not limited to: STEMI; NSTEMI; myocarditis; pericarditis; pulmonary embolism; aortic dissection; pneumothorax; pneumonia; gastritis.  Patient is  in atrial fibrillation which is chronic.  He is anticoagulated with Coumadin, INR is appropriate.  First troponin is not elevated.  EKG unchanged from prior.  I have reviewed the patient's records.  He does have a reduced ejection fraction.  Reviewing records further, he had a gated SPECT myocardial perfusion Lexiscan in 2021 that showed defect consistent with prior infarction.  At that time it was felt that he would continue medical management, but it was recommended that if he had persistent chest pain, would proceed with cardiac catheterization.  Patient's blood pressure is elevated at arrival.  This has been an ongoing issue.  I suspect that he is checking his blood pressure too frequently and is getting a lot of anxiety.  The numbness in his hands, ankles and around his mouth seem consistent with anxiety.  Symptoms are symmetric and bilateral, not consistent with stroke.        Final Clinical Impression(s) / ED Diagnoses Final diagnoses:  Chest pain, unspecified type  Primary hypertension    Rx / DC Orders ED Discharge Orders     None         Orpah Greek, MD 07/23/22 [phone]

## 2022-07-24 DIAGNOSIS — R079 Chest pain, unspecified: Secondary | ICD-10-CM | POA: Diagnosis not present

## 2022-07-24 DIAGNOSIS — I4819 Other persistent atrial fibrillation: Secondary | ICD-10-CM | POA: Diagnosis not present

## 2022-07-24 DIAGNOSIS — I5022 Chronic systolic (congestive) heart failure: Secondary | ICD-10-CM | POA: Diagnosis not present

## 2022-07-24 DIAGNOSIS — I10 Essential (primary) hypertension: Secondary | ICD-10-CM | POA: Diagnosis not present

## 2022-07-24 DIAGNOSIS — N1831 Chronic kidney disease, stage 3a: Secondary | ICD-10-CM | POA: Diagnosis not present

## 2022-07-24 DIAGNOSIS — I110 Hypertensive heart disease with heart failure: Secondary | ICD-10-CM | POA: Diagnosis not present

## 2022-07-24 LAB — PROTIME-INR
INR: 3 — ABNORMAL HIGH (ref 0.8–1.2)
Prothrombin Time: 30.8 seconds — ABNORMAL HIGH (ref 11.4–15.2)

## 2022-07-24 LAB — BASIC METABOLIC PANEL
Anion gap: 10 (ref 5–15)
BUN: 23 mg/dL (ref 8–23)
CO2: 24 mmol/L (ref 22–32)
Calcium: 9 mg/dL (ref 8.9–10.3)
Chloride: 96 mmol/L — ABNORMAL LOW (ref 98–111)
Creatinine, Ser: 1.5 mg/dL — ABNORMAL HIGH (ref 0.61–1.24)
GFR, Estimated: 49 mL/min — ABNORMAL LOW (ref >60)
Glucose, Bld: 107 mg/dL — ABNORMAL HIGH (ref 70–99)
Potassium: 4.7 mmol/L (ref 3.5–5.1)
Sodium: 130 mmol/L — ABNORMAL LOW (ref 135–145)

## 2022-07-24 MED ORDER — CARVEDILOL 6.25 MG PO TABS
6.2500 mg | ORAL_TABLET | Freq: Two times a day (BID) | ORAL | Status: DC
  Administered 2022-07-24: 6.25 mg via ORAL
  Filled 2022-07-24: qty 1

## 2022-07-24 MED ORDER — CARVEDILOL 6.25 MG PO TABS: 6.2500 mg | ORAL_TABLET | Freq: Two times a day (BID) | ORAL | 2 refills | Status: DC

## 2022-07-24 MED ORDER — HYDRALAZINE HCL 10 MG PO TABS
10.0000 mg | ORAL_TABLET | Freq: Three times a day (TID) | ORAL | Status: DC
  Filled 2022-07-24: qty 1

## 2022-07-24 NOTE — Discharge Summary (Signed)
Physician Discharge Summary   Patient: Ralph Sullivan MRN: [id number] DOB: [birth year]  Admit date:     07/23/2022  Discharge date: 07/24/22  Discharge Physician: Cordelia Poche, MD   PCP: Leeroy Cha, MD   Recommendations at discharge:  PCP, Cardiology follow-up  Discharge Diagnoses: Principal Problem:   HTN (hypertension) Active Problems:   Persistent atrial fibrillation ([place])   Chronic kidney disease, stage III (moderate) ([place])   Chronic systolic heart failure ([place])   Chest pain  Resolved Problems:   * No resolved hospital problems. *  Hospital Course: Ralph Sullivan is a 77 y.o. male with a history of atrial fibrillation, hypertension, CHF, CKD stage III, anemia, stroke, dilated aortic root.  Patient presented secondary to chest pain/tightness with subsequent improvement.  Chest pain workup was negative.  Cardiology consulted and recommended no inpatient ischemic workup.  Antihypertensive regimen was adjusted with metoprolol being transition to Coreg for discharge.  Assessment and Plan:  Primary hypertension Patient's home clonidine and metoprolol continued on admission.  Hydralazine IV given with adverse reaction of patient feeling ill.  Cardiology was consulted and recommended transition from metoprolol to Coreg.  Patient discharged on home clonidine 0.3 mg 3 times daily and Coreg 6.25 mg twice daily.  Patient to follow-up with cardiology as an outpatient.  Chest pain Appears to be likely secondary to uncontrolled blood pressure.  ACS workup was negative. Troponin 13 > 15.  Chest pain resolved.  Cardiology consulted and have recommended to defer ischemic workup.Marland Kitchen  Recommendation for outpatient follow-up and to consider ischemic workup if chest pain symptoms recur.  Persistent atrial fibrillation Metoprolol transitioned to Coreg on discharge per cardiology.  Continue Coumadin.  HFmrEF Stable.  CKD stage IIIa Stable.   Consultants: Cardiology Procedures  performed: None Disposition: Home Diet recommendation: Cardiac diet  DISCHARGE MEDICATION: Allergies as of 07/24/2022       Reactions   Allopurinol Other (See Comments)   Colchicine Other (See Comments)   Duloxetine Other (See Comments)   More nervous   Elemental Sulfur Nausea And Vomiting   Oxycontin [oxycodone Hcl] Nausea And Vomiting        Medication List     STOP taking these medications    metoprolol tartrate 100 MG tablet Commonly known as: LOPRESSOR       TAKE these medications    acetaminophen 325 MG suppository Commonly known as: TYLENOL Place 325 mg rectally every 4 (four) hours as needed.   carvedilol 6.25 MG tablet Commonly known as: COREG Take 1 tablet (6.25 mg total) by mouth 2 (two) times daily with a meal.   CINNAMON PO Take 1,000 mg by mouth in the morning and at bedtime.   cloNIDine 0.1 MG tablet Commonly known as: CATAPRES Take 0.1 mg by mouth 2 (two) times daily as needed (BP over 90 or 140). (Take between 0.3 mg doses)   cloNIDine 0.3 MG tablet Commonly known as: Catapres Take 1 tablet (0.3 mg total) by mouth 3 (three) times daily.   lactose free nutrition Liqd Take 237 mLs by mouth 2 (two) times daily between meals.   loratadine 10 MG tablet Commonly known as: CLARITIN Take 10 mg by mouth daily as needed for allergies.   OMEGA 3 PO Take 1 capsule by mouth 2 (two) times daily.   vitamin E 180 MG (400 UNITS) capsule Take 400 Units by mouth daily.   warfarin 5 MG tablet Commonly known as: COUMADIN Take as directed. If you are unsure how to take this medication,  talk to your nurse or doctor. Original instructions: TAKE 1 TABLET BY MOUTH ONCE DAILY OR  AS  DIRECTED  BY  THE  COUMADIN  CLINIC What changed:  how much to take how to take this when to take this additional instructions        Follow-up Information     Imogene Burn, PA-C. Go on 07/29/2022.   Specialty: Cardiology Why: Follow up with cardiology. Appt at 1:15.  Please be 15 min early. Contact information: [address] [place] [address] [phone]                Discharge Exam: BP 126/83 (BP Location: Right Arm)   Pulse 83   Temp 98.3 F (36.8 C) (Oral)   Resp 16   Ht 6' (1.829 m)   Wt 77.1 kg   SpO2 99%   BMI 23.06 kg/m   General exam: Appears calm and comfortable Respiratory system: Clear to auscultation. Respiratory effort normal. Cardiovascular system: S1 & S2 heard Gastrointestinal system: Abdomen is nondistended, soft and nontender. Normal bowel sounds heard. Central nervous system: Alert and oriented. No focal neurological deficits. Musculoskeletal: No edema. No calf tenderness Skin: No cyanosis. No rashes Psychiatry: Judgement and insight appear normal. Mood & affect appropriate.    Condition at discharge: stable  The results of significant diagnostics from this hospitalization (including imaging, microbiology, ancillary and laboratory) are listed below for reference.   Imaging Studies: DG Chest Port 1 View  Result Date: 07/23/2022 CLINICAL DATA:  Chest pain EXAM: PORTABLE CHEST 1 VIEW COMPARISON:  04/01/2017 FINDINGS: Normal heart size. Stable aortic tortuosity. There is no edema, consolidation, effusion, or pneumothorax. No acute osseous finding IMPRESSION: Low volume chest without acute finding. Electronically Signed   By: Jorje Guild M.D.   On: 07/23/2022 06:17    Microbiology: Results for orders placed or performed during the hospital encounter of 07/16/16  Surgical pcr screen     Status: Abnormal   Collection Time: 07/16/16  9:42 AM   Specimen: Nasal Mucosa; Nasal Swab  Result Value Ref Range Status   MRSA, PCR NEGATIVE NEGATIVE Final   Staphylococcus aureus POSITIVE (A) NEGATIVE Final    Comment:        The Xpert SA Assay (FDA approved for NASAL specimens in patients over 32 years of age), is one component of a comprehensive surveillance program.  Test performance has been  validated by [facility] for patients greater than or equal to 21 year old. It is not intended to diagnose infection nor to guide or monitor treatment.     Labs: CBC: Recent Labs  Lab 07/23/22 0555  WBC 7.2  NEUTROABS 5.2  HGB 13.7  HCT 40.2  MCV 91.6  PLT 123456*   Basic Metabolic Panel: Recent Labs  Lab 07/23/22 0555 07/24/22 0544  NA 131* 130*  K 3.9 4.7  CL 95* 96*  CO2 25 24  GLUCOSE 126* 107*  BUN 23 23  CREATININE 1.44* 1.50*  CALCIUM 9.4 9.0    Discharge time spent: 35 minutes.  Signed: Cordelia Poche, MD [facility] 07/24/2022

## 2022-07-24 NOTE — Progress Notes (Addendum)
Rounding Note    Patient Name: Ralph Sullivan Date of Encounter: 07/24/2022  [place] Cardiologist: Candee Furbish, MD   Subjective   Patient is resting comfortably in bed this morning. His chest discomfort very much seems to correlate with elevated BP. Denies discomfort or dyspnea this morning.  Inpatient Medications    Scheduled Meds:  cloNIDine  0.3 mg Oral TID   sodium chloride flush  3 mL Intravenous Q12H   Warfarin - Pharmacist Dosing Inpatient   Does not apply q1600   Continuous Infusions:  PRN Meds: acetaminophen **OR** acetaminophen, cloNIDine, polyethylene glycol, polyvinyl alcohol   Vital Signs    Vitals:   07/23/22 1608 07/23/22 1750 07/23/22 2025 07/24/22 0213  BP: (!) 142/98 (!) 152/95 (!) 140/85 (!) 138/97  Pulse: 92 (!) 107 94 81  Resp: 17 19 17 17   Temp: 98.1 F (36.7 C) 98 F (36.7 C) 98 F (36.7 C) 98.4 F (36.9 C)  TempSrc: Oral Oral Oral Oral  SpO2: 98% 96% 100% 97%  Weight:      Height:        Intake/Output Summary (Last 24 hours) at 07/24/2022 0757 Last data filed at 07/24/2022 0422 Gross per 24 hour  Intake --  Output 500 ml  Net -500 ml      07/23/2022    4:07 PM 07/08/2022   10:07 AM 05/07/2022    9:41 AM  Last 3 Weights  Weight (lbs) 170 lb 172 lb 180 lb 9.6 oz  Weight (kg) 77.111 kg 78.019 kg 81.92 kg      Telemetry    Not on telemetry - Personally Reviewed  ECG    No new tracing - Personally Reviewed  Physical Exam   GEN: No acute distress.   Neck: No JVD Cardiac: Irregularly irregular, no murmurs, rubs, or gallops.  Respiratory: Clear to auscultation bilaterally. GI: Soft, nontender, non-distended  MS: No edema; No deformity. Neuro:  Nonfocal  Psych: Normal affect   Labs    High Sensitivity Troponin:   Recent Labs  Lab 07/23/22 0555 07/23/22 0800  TROPONINIHS 13 15     Chemistry Recent Labs  Lab 07/23/22 0555 07/24/22 0544  NA 131* 130*  K 3.9 4.7  CL 95* 96*  CO2 25 24  GLUCOSE 126*  107*  BUN 23 23  CREATININE 1.44* 1.50*  CALCIUM 9.4 9.0  GFRNONAA 51* 49*  ANIONGAP 11 10    Lipids No results for input(s): "CHOL", "TRIG", "HDL", "LABVLDL", "LDLCALC", "CHOLHDL" in the last 168 hours.  Hematology Recent Labs  Lab 07/23/22 0555  WBC 7.2  RBC 4.39  HGB 13.7  HCT 40.2  MCV 91.6  MCH 31.2  MCHC 34.1  RDW 12.7  PLT 149*   Thyroid No results for input(s): "TSH", "FREET4" in the last 168 hours.  BNPNo results for input(s): "BNP", "PROBNP" in the last 168 hours.  DDimer No results for input(s): "DDIMER" in the last 168 hours.   Radiology    DG Chest Port 1 View  Result Date: 07/23/2022 CLINICAL DATA:  Chest pain EXAM: PORTABLE CHEST 1 VIEW COMPARISON:  04/01/2017 FINDINGS: Normal heart size. Stable aortic tortuosity. There is no edema, consolidation, effusion, or pneumothorax. No acute osseous finding IMPRESSION: Low volume chest without acute finding. Electronically Signed   By: Jorje Guild M.D.   On: 07/23/2022 06:17    Cardiac Studies   Echocardiogram 04/29/2022 1. Left ventricular ejection fraction, by estimation, is 45 to 50% . The left ventricle has  mildly decreased function. The left ventricle demonstrates global hypokinesis. There is mild concentric left ventricular hypertrophy. Left ventricular diastolic function could not be evaluated. 2. Right ventricular systolic function is normal. The right ventricular size is moderately enlarged. There is mildly elevated pulmonary artery systolic pressure. The estimated right ventricular systolic pressure is 41. 4 mmHg. 3. Left atrial size was moderately dilated. 4. Right atrial size was moderately dilated. 5. The mitral valve is normal in structure. Trivial mitral valve regurgitation. No evidence of mitral stenosis. 6. The aortic valve is tricuspid. There is mild calcification of the aortic valve. There is mild thickening of the aortic valve. Aortic valve regurgitation is trivial. Aortic valve sclerosis is present,  with no evidence of aortic valve stenosis. 7. Aortic dilatation noted. There is mild dilatation of the ascending aorta, measuring 41 mm.   Lexiscan 10/09/2019 Nuclear stress EF: 41%. There was no ST segment deviation noted during stress. No T wave inversion was noted during stress. Findings consistent with prior myocardial infarction. This is an intermediate risk study. The left ventricular ejection fraction is moderately decreased (30-44%).   1. There is a medium size (10-15% of LV), fixed, moderate to severe perfusion defect present in the basal to apical inferior segments and apex consistent with prior infarction. The wall motion in this region is hypokinetic, favoring prior infarction versus diaphragm attenuation.  2. No ischemia.  3. Moderately reduced LVEF, 41%.  4. This is an intermediate risk study.   Patient Profile     Ralph Sullivan is a 77 y.o. male with a hx of chronic systolic heart failure, persistent afib on coumadin, RBBB, LAFB, CKD III, CVA 2015, who is being seen for the evaluation of chest pain at the request of Dr. Tomi Bamberger.   Assessment & Plan    Chest pain  Cardiology consulted for concern of chest pain. Per patient, discomfort is intermittent and correlates with periods of elevated BP. Previous perfusion studies have not shown ischemia.   With stable symptoms, negative troponins, hx negative perfusion studies, and CKD, would not favor further invasive ischemic evaluation at this time.  Recommend tighter BP control. Outpatient follow up has been arranged. If patient continues with chest discomfort, would need to revisit possible cath.   Hypertension  Patient continues to have elevated diastolic BP. Patient should be encouraged to keep a daily BP log so that adjustments can be made at outpatient follow up.   Given that he has baseline renal dysfunction precluding use of ACEi/Arb at this time and has been intolerant to Amlodipine, will switch from Metoprolol to Coreg  6.25 BID. Unfortunately, patient also not able to start Spirolactone due to increased potassium levels. Would consider referral to hypertension clinic Patient had flushing/redness with hydralazine, would not favor daily dosing as previously considered.    Chronic HFmrEF  Patient well compensated on physical exam. CKD limits GDMT.  Will transition from Metoprolol to Coreg 6.25mg  BID.  Persistent Atrial Fibrillation Bifascicular block  Secondary hypercoagulable state  Patient not currently on telemetry. Irregularly irregular rhythm on exam. Denies symptoms such as palpitations/rapid HR.   As above, switching from Metoprolol to Coreg 6.25mg  BID. Will need to monitor HR closely with AV nodal blocking in setting of bifascicular block. Metoprolol previously decreased from 200mg  BID to 100mg  BID due to low HR though patient also appears to have occasional tachycardic breakthrough.  Continue Warfarin  CKD  Creatinine near baseline of 1.5. Continue to monitor closely and follow up with nephrology.  For questions or updates, please contact [place] Please consult [email or web address] for contact info under        Signed, Lily Kocher, PA-C  07/24/2022, 7:57 AM    Patient seen and examined with Lily Kocher PA-C.  Agree as above, with the following exceptions and changes as noted below. Responded very well to carvedilol transition from metoprolol. Gen: NAD, CV: iRRR, no murmurs, Lungs: clear, Abd: soft, Extrem: Warm, well perfused, no edema, Neuro/Psych: alert and oriented x 3, normal mood and affect. All available labs, radiology testing, previous records reviewed. Continue clonidine at discharge as well as carvedilol 6.25 mg BID. Stop metoprolol. Follow up arranged in CV clinic. Consider outpatient ischemic evaluation to define coronary anatomy given reduced EF.   Patient seeing nephrology in 3-4 days, they can assist in reviewing BP control at that visit. Stable for discharge  from CV perspective.   Elouise Munroe, MD 07/24/22 12:26 PM

## 2022-07-24 NOTE — Plan of Care (Signed)

## 2022-07-24 NOTE — Progress Notes (Deleted)
Cardiology Office Note:    Date:  07/24/2022   ID:  OAKLAN SERVEN, DOB October 26, 1945, MRN [id number]  PCP:  Leeroy Cha, MD  [place] [facility] Cardiologist:  Candee Furbish, MD { Click to update primary MD,subspecialty MD or APP then REFRESH:1}  *** Referring MD: Leeroy Cha,*   Chief Complaint:  No chief complaint on file. {Click here for Visit Info    :1}    History of Present Illness:   Ralph Sullivan is a 77 y.o. male with a hx of chronic systolic heart failure, persistent afib on coumadin, RBBB, LAFB, CKD III, CVA 2015, who was seen In the ED 07/24/22 for the evaluation of chest pain related to elevated BP's. Trop negative.          Past Medical History:  Diagnosis Date   Atrial fibrillation ([place]) 02/08/2013   CHADS-VAS - 2 (AGE, HTN). On anticoagulation. Holter 6/14 - 2.3 sec pause. Avg HR 87. 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    Allergies:   Allopurinol, Colchicine, Duloxetine, Elemental sulfur, and Oxycontin [oxycodone hcl]   Social History   Tobacco Use   Smoking status: Former    Types: Cigarettes    Quit date: 03/1970    Years since quitting: 52.3   Smokeless tobacco: Never  Vaping Use   Vaping Use: Never used  Substance Use Topics   Alcohol use: No    Alcohol/week: 0.0 standard drinks of alcohol   Drug use: No    Family Hx: The patient's family history includes Diabetes in his mother; Hypertension  in his mother.  ROS     Physical Exam:    VS:  There were no vitals taken for this visit.    Wt Readings from Last 3 Encounters:  07/23/22 170 lb (77.1 kg)  07/08/22 172 lb (78 kg)  05/07/22 180 lb 9.6 oz (81.9 kg)    Physical Exam  GEN: Well nourished, well developed, in no acute distress  HEENT: normal  Neck: no JVD, carotid bruits, or masses Cardiac:RRR; no murmurs, rubs, or gallops  Respiratory:  clear to auscultation bilaterally, normal work of breathing GI: soft, nontender, nondistended, + BS Ext: without cyanosis, clubbing, or edema, Good distal pulses bilaterally MS: no deformity or atrophy  Skin: warm and dry, no rash Neuro:  Alert and Oriented x 3, Strength and sensation are intact Psych: euthymic mood, full affect        EKGs/Labs/Other Test Reviewed:    EKG:  EKG is *** ordered today.  The ekg ordered today demonstrates ***  Recent Labs: 04/19/2022: ALT 10 07/23/2022: Hemoglobin 13.7; Platelets 149 07/24/2022: BUN 23; Creatinine, Ser 1.50; Potassium 4.7; Sodium 130   Recent Lipid Panel No results for input(s): "CHOL", "TRIG", "HDL", "VLDL", "LDLCALC", "LDLDIRECT" in the last 8760 hours.   Prior CV Studies: {Select studies to display:26339}  Echocardiogram 04/29/2022 1. Left ventricular ejection fraction, by estimation, is 45 to 50% . The left ventricle has mildly  decreased function. The left ventricle demonstrates global hypokinesis. There is mild concentric left ventricular hypertrophy. Left ventricular diastolic function could not be evaluated. 2. Right ventricular systolic function is normal. The right ventricular size is moderately enlarged. There is mildly elevated pulmonary artery systolic pressure. The estimated right ventricular systolic pressure is 41. 4 mmHg. 3. Left atrial size was moderately dilated. 4. Right atrial size was moderately dilated. 5. The mitral valve is normal in structure. Trivial mitral valve regurgitation. No evidence of mitral  stenosis. 6. The aortic valve is tricuspid. There is mild calcification of the aortic valve. There is mild thickening of the aortic valve. Aortic valve regurgitation is trivial. Aortic valve sclerosis is present, with no evidence of aortic valve stenosis. 7. Aortic dilatation noted. There is mild dilatation of the ascending aorta, measuring 41 mm.   Lexiscan 10/09/2019 Nuclear stress EF: 41%. There was no ST segment deviation noted during stress. No T wave inversion was noted during stress. Findings consistent with prior myocardial infarction. This is an intermediate risk study. The left ventricular ejection fraction is moderately decreased (30-44%).   1. There is a medium size (10-15% of LV), fixed, moderate to severe perfusion defect present in the basal to apical inferior segments and apex consistent with prior infarction. The wall motion in this region is hypokinetic, favoring prior infarction versus diaphragm attenuation.  2. No ischemia.  3. Moderately reduced LVEF, 41%.  4. This is an intermediate risk study.      Risk Assessment/Calculations/Metrics:   {Does this patient have ATRIAL FIBRILLATION?:[phone]}          ASSESSMENT & PLAN:   No problem-specific Assessment & Plan notes found for this encounter.   Chest pain in ED 07/24/22  Per patient, discomfort is intermittent and correlates with periods of elevated BP. Previous perfusion studies have not shown ischemia.    With stable symptoms, negative troponins, hx negative perfusion studies, and CKD, would not favor further invasive ischemic evaluation at this time.  Recommend tighter BP control. Outpatient follow up has been arranged. If patient continues with chest discomfort, would need to revisit possible cath.    Hypertension   Patient continues to have elevated diastolic BP. Patient should be encouraged to keep a daily BP log so that adjustments can be made at outpatient follow up.    Given that he has baseline renal  dysfunction precluding use of ACEi/Arb at this time and has been intolerant to Amlodipine, will switch from Metoprolol to Coreg 6.25 BID. Unfortunately, patient also not able to start Spirolactone due to increased potassium levels. Would consider referral to hypertension clinic Patient had flushing/redness with hydralazine, would not favor daily dosing as previously considered.    Chronic HFmrEF   Patient well compensated on physical exam. CKD limits GDMT.   Will transition from Metoprolol to Coreg 6.25mg  BID.   Persistent Atrial Fibrillation Bifascicular block  Secondary hypercoagulable state As above, switching from Metoprolol to Coreg 6.25mg  BID. Will need to monitor HR closely with AV nodal blocking in setting of bifascicular block. Metoprolol previously decreased from 200mg  BID to 100mg  BID due to low HR though patient also appears to have occasional tachycardic breakthrough.  Continue Warfarin   CKD   Creatinine near baseline of 1.5. Continue to monitor closely and follow up with nephrology.        For questions or updates, please contact [place] Please consult [email or web address] for contact info under                {  Are you ordering a CV Procedure (e.g. stress test, cath, DCCV, TEE, etc)?   Press F2        :[id number]   Dispo:  No follow-ups on file.   Medication Adjustments/Labs and Tests Ordered: Current medicines are reviewed at length with the patient today.  Concerns regarding medicines are outlined above.  Tests Ordered: No orders of the defined types were placed in this encounter.  Medication Changes: No orders of the defined types were placed in this encounter.  Sumner Boast, PA-C  07/24/2022 10:07 AM    [address], [place], [place]  [address] Phone: [phone]; Fax: [phone]

## 2022-07-24 NOTE — Plan of Care (Signed)

## 2022-07-24 NOTE — Discharge Instructions (Signed)
Ralph Sullivan,  You are in the hospital because of some chest pain with very high blood pressure.  Your blood pressure medications were adjusted and you have improved blood pressure control.  Thankfully your chest pain has resolved and your workup for heart attack appears to be low risk.  The cardiologist is recommending that you follow-up in the office and we will discharge you on Coreg; please discontinue your metoprolol.

## 2022-07-24 NOTE — Hospital Course (Signed)
Ralph Sullivan is a 77 y.o. male with a history of atrial fibrillation, hypertension, CHF, CKD stage III, anemia, stroke, dilated aortic root.  Patient presented secondary to chest pain/tightness with subsequent improvement.  Chest pain workup was negative.  Cardiology consulted and recommended no inpatient ischemic workup.  Antihypertensive regimen was adjusted with metoprolol being transition to Coreg for discharge.

## 2022-07-27 DIAGNOSIS — N1832 Chronic kidney disease, stage 3b: Secondary | ICD-10-CM | POA: Diagnosis not present

## 2022-07-27 DIAGNOSIS — I4891 Unspecified atrial fibrillation: Secondary | ICD-10-CM | POA: Diagnosis not present

## 2022-07-27 DIAGNOSIS — I129 Hypertensive chronic kidney disease with stage 1 through stage 4 chronic kidney disease, or unspecified chronic kidney disease: Secondary | ICD-10-CM | POA: Diagnosis not present

## 2022-07-27 DIAGNOSIS — M109 Gout, unspecified: Secondary | ICD-10-CM | POA: Diagnosis not present

## 2022-07-28 ENCOUNTER — Ambulatory Visit: Payer: Medicare HMO | Attending: Cardiology

## 2022-07-28 DIAGNOSIS — Z5181 Encounter for therapeutic drug level monitoring: Secondary | ICD-10-CM

## 2022-07-28 DIAGNOSIS — I4819 Other persistent atrial fibrillation: Secondary | ICD-10-CM

## 2022-07-28 LAB — POCT INR: INR: 1.7 — AB (ref 2.0–3.0)

## 2022-07-28 NOTE — Patient Instructions (Signed)
Description   Take 2 tablets today, then resume same dosage of Warfarin 1 tablet daily except for 1.5 tablets on Tuesdays.  Recheck INR in 4 weeks. Drinking 2 boost daily. Call us with any medication changes or questions to Coumadin Clinic # [phone].

## 2022-07-29 ENCOUNTER — Ambulatory Visit: Payer: Medicare HMO | Admitting: Physician Assistant

## 2022-08-03 DIAGNOSIS — I5042 Chronic combined systolic (congestive) and diastolic (congestive) heart failure: Secondary | ICD-10-CM | POA: Diagnosis not present

## 2022-08-03 DIAGNOSIS — I4819 Other persistent atrial fibrillation: Secondary | ICD-10-CM | POA: Diagnosis not present

## 2022-08-03 DIAGNOSIS — N289 Disorder of kidney and ureter, unspecified: Secondary | ICD-10-CM | POA: Diagnosis not present

## 2022-08-03 DIAGNOSIS — E1122 Type 2 diabetes mellitus with diabetic chronic kidney disease: Secondary | ICD-10-CM | POA: Diagnosis not present

## 2022-08-03 DIAGNOSIS — N1832 Chronic kidney disease, stage 3b: Secondary | ICD-10-CM | POA: Diagnosis not present

## 2022-08-03 DIAGNOSIS — I130 Hypertensive heart and chronic kidney disease with heart failure and stage 1 through stage 4 chronic kidney disease, or unspecified chronic kidney disease: Secondary | ICD-10-CM | POA: Diagnosis not present

## 2022-08-03 DIAGNOSIS — R54 Age-related physical debility: Secondary | ICD-10-CM | POA: Diagnosis not present

## 2022-08-04 ENCOUNTER — Ambulatory Visit: Payer: Medicare HMO | Admitting: Physician Assistant

## 2022-08-05 ENCOUNTER — Ambulatory Visit: Payer: Medicare HMO | Admitting: Nurse Practitioner

## 2022-08-25 ENCOUNTER — Ambulatory Visit: Payer: Medicare HMO

## 2022-08-31 ENCOUNTER — Ambulatory Visit: Payer: Medicare HMO | Attending: Cardiology

## 2022-08-31 DIAGNOSIS — I4819 Other persistent atrial fibrillation: Secondary | ICD-10-CM | POA: Diagnosis not present

## 2022-08-31 DIAGNOSIS — Z5181 Encounter for therapeutic drug level monitoring: Secondary | ICD-10-CM

## 2022-08-31 LAB — POCT INR: INR: 3 (ref 2.0–3.0)

## 2022-08-31 NOTE — Patient Instructions (Signed)
Description   Continue taking Warfarin 1 tablet daily except for 1.5 tablets on Tuesdays.   Recheck INR in 5 weeks.  Drinking 2 boost daily.  Call us with any medication changes or questions to Coumadin Clinic # [phone].

## 2022-09-01 DIAGNOSIS — N1831 Chronic kidney disease, stage 3a: Secondary | ICD-10-CM | POA: Diagnosis not present

## 2022-09-01 DIAGNOSIS — Z7901 Long term (current) use of anticoagulants: Secondary | ICD-10-CM | POA: Diagnosis not present

## 2022-09-01 DIAGNOSIS — Z8249 Family history of ischemic heart disease and other diseases of the circulatory system: Secondary | ICD-10-CM | POA: Diagnosis not present

## 2022-09-01 DIAGNOSIS — I4891 Unspecified atrial fibrillation: Secondary | ICD-10-CM | POA: Diagnosis not present

## 2022-09-01 DIAGNOSIS — D6869 Other thrombophilia: Secondary | ICD-10-CM | POA: Diagnosis not present

## 2022-09-01 DIAGNOSIS — Z8673 Personal history of transient ischemic attack (TIA), and cerebral infarction without residual deficits: Secondary | ICD-10-CM | POA: Diagnosis not present

## 2022-09-01 DIAGNOSIS — I130 Hypertensive heart and chronic kidney disease with heart failure and stage 1 through stage 4 chronic kidney disease, or unspecified chronic kidney disease: Secondary | ICD-10-CM | POA: Diagnosis not present

## 2022-09-01 DIAGNOSIS — Z9181 History of falling: Secondary | ICD-10-CM | POA: Diagnosis not present

## 2022-09-01 DIAGNOSIS — Z87891 Personal history of nicotine dependence: Secondary | ICD-10-CM | POA: Diagnosis not present

## 2022-09-01 DIAGNOSIS — I509 Heart failure, unspecified: Secondary | ICD-10-CM | POA: Diagnosis not present

## 2022-09-01 DIAGNOSIS — J309 Allergic rhinitis, unspecified: Secondary | ICD-10-CM | POA: Diagnosis not present

## 2022-09-01 DIAGNOSIS — R61 Generalized hyperhidrosis: Secondary | ICD-10-CM | POA: Diagnosis not present

## 2022-09-18 ENCOUNTER — Telehealth: Payer: Self-pay | Admitting: Cardiology

## 2022-09-18 DIAGNOSIS — I10 Essential (primary) hypertension: Secondary | ICD-10-CM

## 2022-09-18 MED ORDER — HYDRALAZINE HCL 10 MG PO TABS: 10.0000 mg | ORAL_TABLET | Freq: Three times a day (TID) | ORAL | 1 refills | Status: DC

## 2022-09-18 NOTE — Telephone Encounter (Signed)
Attempted phone call to pt and left vociemail message to call triage at [phone]

## 2022-09-18 NOTE — Addendum Note (Signed)
Addended by: Alois Cliche on: 09/18/2022 03:28 PM   Modules accepted: Orders

## 2022-09-18 NOTE — Telephone Encounter (Signed)
Pt returning call

## 2022-09-18 NOTE — Telephone Encounter (Addendum)
Spoke with pt who reports current BP 118/107 with HR 66 after scheduled Clonidine 0.3mg .  Pt complaining of headache.  Pt reports medication he was unable to take  and prescribed by nephrology was Olmesartan 5mg  bid. Pt advised he should contact nephrology and let his provider know he was unable to tolerate the Olmesartan to see if they might have another recommendation as Dr Anne Fu is out of the office today and Monday.   Advised will discuss with Dr Excell Seltzer, DOD and will call with any further recommendations.  Pt verbalizes understanding and agrees with current plan.

## 2022-09-18 NOTE — Telephone Encounter (Signed)
Spoke with pt who reports increasing BP since his renal provider put him on a new BP medication which he stopped 2 weeks ago due to swelling.  Pt states he does not know the name of the medication.  Pt reports current BP 159/108 after scheduled Clonidine 0.3mg  this morning and his prn dose of Clonidine 0.1mg .  Pt denies current CP, SOB or new edema.  Pt states his next scheduled dose of Clonidine 0.3mg  is at 1pm.  Recommended pt go ahead and take that dose as it is currently 1225pm.  Recommended pt retake BP at 1pm and RN will call for reading.  Pt verbalizes understanding and agrees with current plan.

## 2022-09-18 NOTE — Telephone Encounter (Signed)
Pt c/o BP issue: STAT if pt c/o blurred vision, one-sided weakness or slurred speech  1. What are your last 5 BP readings?   179/129 (this morning) 169/119  2. Are you having any other symptoms (ex. Dizziness, headache, blurred vision, passed out)?   No  3. What is your BP issue?   Patient wants call back to discuss high blood pressure and medication to get this down.

## 2022-09-18 NOTE — Telephone Encounter (Signed)
Spoke with Dr Excell Seltzer who advises pt begin Hydralazine 10mg  - 1 tablet by mouth tid and refer to [facility] HTN clinic.  Pt advised of Dr Earmon Phoenix recommendation above and advised to continue to monitor BP.  Pt advised he may contact on call provider over the weekend for further questions or concerns.  Reviewed ED precautions.  Pt verbalizes understanding and thanked Charity fundraiser for the call.

## 2022-09-25 ENCOUNTER — Telehealth: Payer: Self-pay | Admitting: Cardiology

## 2022-09-25 DIAGNOSIS — I10 Essential (primary) hypertension: Secondary | ICD-10-CM

## 2022-09-25 MED ORDER — CARVEDILOL 25 MG PO TABS
25.0000 mg | ORAL_TABLET | Freq: Two times a day (BID) | ORAL | 3 refills | Status: DC
Start: 2022-09-25 — End: 2022-11-02

## 2022-09-25 NOTE — Telephone Encounter (Signed)
Pt c/o BP issue: STAT if pt c/o blurred vision, one-sided weakness or slurred speech  1. What are your last 5 BP readings?  249/129 this morning when he got up 179/109 when before EMS left  2. Are you having any other symptoms (ex. Dizziness, headache, blurred vision, passed out)? Tingling in hands and feet this morning, and legs going numb.  3. What is your BP issue? Elevated BP.

## 2022-09-25 NOTE — Telephone Encounter (Signed)
Pt has been referred to HTN clinic.  He is scheduled for f/u up with Dr Anne Fu 10/20/2022.

## 2022-09-25 NOTE — Telephone Encounter (Addendum)
Spoke to the patient, he has experienced elevated blood pressure for a while. This morning he called EMS  bp was 249/129 HR 108, tingling / sweating of both hands,  and BLE swelling. Pt did not go to the ED. Pt stated he did take an extra clonidine an hour ago, rechecked bp while on the phone 156/107.He does not monitor bp daily.  Advised pt several times to seek medical attention, pt stated he wanted " I need an appointment with my doctor". Advise pt this is not the best option and explained ED precautions. Pt replied " I'm not going to the ED because I am going to sit there for hours. I need to see my doctor". Will forward to MD and nurse for advise. Per Dr. Anne Fu: advised pt increase carvedilol 25 mg BID , f/u with APP, and referral to HTN clinic. Pt stated his health insurance plan does not cover the cost of APP visit. Pt will f/u with HTN clinic.

## 2022-09-28 NOTE — Telephone Encounter (Signed)
Appt scheduled for pt to be seen and evaluated by Dr Anne Fu 09/30/22.  Left message for pt of appt on his voicemail and requested he call back to cancel if he is unable to come in.

## 2022-09-29 NOTE — Telephone Encounter (Signed)
Pt  called back and cancelled appt as scheduled for 6/5.  He will keep the appt scheduled on 10/20/22.

## 2022-09-29 NOTE — Telephone Encounter (Signed)
Attempted to call pt on both phone numbers listed in his chart to verify he is aware of appt  schedule for 6/5 with Dr Anne Fu.  Left message to call back to verify.

## 2022-09-30 ENCOUNTER — Ambulatory Visit: Payer: Medicare HMO | Admitting: Cardiology

## 2022-10-05 ENCOUNTER — Ambulatory Visit: Payer: Medicare HMO | Attending: Internal Medicine

## 2022-10-05 DIAGNOSIS — Z5181 Encounter for therapeutic drug level monitoring: Secondary | ICD-10-CM | POA: Diagnosis not present

## 2022-10-05 DIAGNOSIS — I4819 Other persistent atrial fibrillation: Secondary | ICD-10-CM | POA: Diagnosis not present

## 2022-10-05 LAB — POCT INR: INR: 1.8 — AB (ref 2.0–3.0)

## 2022-10-05 NOTE — Patient Instructions (Signed)
TAKE 1.5 TABLETS TONIGHT ONLY THEN Continue taking Warfarin 1 tablet daily except for 1.5 tablets on Tuesdays.   Recheck INR in 5 weeks.  Drinking 2 boost daily.  Call us with any medication changes or questions to Coumadin Clinic # [phone].

## 2022-10-19 DIAGNOSIS — E119 Type 2 diabetes mellitus without complications: Secondary | ICD-10-CM | POA: Diagnosis not present

## 2022-10-19 DIAGNOSIS — I4819 Other persistent atrial fibrillation: Secondary | ICD-10-CM | POA: Diagnosis not present

## 2022-10-19 DIAGNOSIS — N1832 Chronic kidney disease, stage 3b: Secondary | ICD-10-CM | POA: Diagnosis not present

## 2022-10-19 DIAGNOSIS — I5022 Chronic systolic (congestive) heart failure: Secondary | ICD-10-CM | POA: Diagnosis not present

## 2022-10-19 DIAGNOSIS — N289 Disorder of kidney and ureter, unspecified: Secondary | ICD-10-CM | POA: Diagnosis not present

## 2022-10-19 DIAGNOSIS — R197 Diarrhea, unspecified: Secondary | ICD-10-CM | POA: Diagnosis not present

## 2022-10-19 DIAGNOSIS — I130 Hypertensive heart and chronic kidney disease with heart failure and stage 1 through stage 4 chronic kidney disease, or unspecified chronic kidney disease: Secondary | ICD-10-CM | POA: Diagnosis not present

## 2022-10-19 DIAGNOSIS — I129 Hypertensive chronic kidney disease with stage 1 through stage 4 chronic kidney disease, or unspecified chronic kidney disease: Secondary | ICD-10-CM | POA: Diagnosis not present

## 2022-10-19 DIAGNOSIS — I10 Essential (primary) hypertension: Secondary | ICD-10-CM | POA: Diagnosis not present

## 2022-10-20 ENCOUNTER — Ambulatory Visit: Payer: Medicare HMO | Attending: Cardiology | Admitting: Cardiology

## 2022-10-20 ENCOUNTER — Encounter: Payer: Self-pay | Admitting: Cardiology

## 2022-10-20 VITALS — BP 160/118 | HR 50 | Ht 72.0 in | Wt 160.8 lb

## 2022-10-20 DIAGNOSIS — I4819 Other persistent atrial fibrillation: Secondary | ICD-10-CM

## 2022-10-20 DIAGNOSIS — I10 Essential (primary) hypertension: Secondary | ICD-10-CM

## 2022-10-20 MED ORDER — CLONIDINE HCL 0.2 MG PO TABS: 0.2000 mg | ORAL_TABLET | Freq: Three times a day (TID) | ORAL | 6 refills | Status: DC

## 2022-10-20 MED ORDER — AMLODIPINE BESYLATE 5 MG PO TABS: 5.0000 mg | ORAL_TABLET | Freq: Every day | ORAL | 3 refills | Status: DC

## 2022-10-20 MED ORDER — CLONIDINE HCL 0.1 MG PO TABS: 0.1000 mg | ORAL_TABLET | Freq: Three times a day (TID) | ORAL | 6 refills | Status: DC

## 2022-10-20 MED ORDER — SPIRONOLACTONE 25 MG PO TABS: 25.0000 mg | ORAL_TABLET | Freq: Every day | ORAL | 6 refills | Status: DC

## 2022-10-20 NOTE — Progress Notes (Signed)
  Cardiology Office Note:  .   Date:  10/20/2022  ID:  Ralph Sullivan, DOB 02-24-1946, MRN [id number] PCP: Lorenda Ishihara, MD  [facility] Providers Cardiologist:  Donato Schultz, MD    History of Present Illness: .   LADARRION TELFAIR is a 77 y.o. male longstanding cardiac history, challenging hypertension, admitted in March 2024 with chest pain.  His metoprolol was changed to carvedilol.  Troponins were normal.  Feels quite nervous.  Had an accident in prior doctor's office embarrassing.  Has been having trouble with blood pressure.  Really would like to try to get this down.  ROS: no cp  Studies Reviewed: Marland Kitchen        See below Risk Assessment/Calculations:          Physical Exam:   VS:  BP (!) 160/118   Pulse (!) 50   Ht 6' (1.829 m)   Wt 160 lb 12.8 oz (72.9 kg)   SpO2 98%   BMI 21.81 kg/m    Wt Readings from Last 3 Encounters:  10/20/22 160 lb 12.8 oz (72.9 kg)  07/23/22 170 lb (77.1 kg)  07/08/22 172 lb (78 kg)    GEN: Thinner, well developed in no acute distress NECK: No JVD; No carotid bruits CARDIAC: IRREG, no murmurs, rubs, gallops RESPIRATORY:  Clear to auscultation without rales, wheezing or rhonchi  ABDOMEN: Soft, non-tender, non-distended EXTREMITIES:  No edema; No deformity   ASSESSMENT AND PLAN: .    Primary hypertension difficult to control - 160/131 this morning currently 160/118 here.  No neurologic type symptoms.  He is anxious however.  We have struggled with ups and downs of blood pressure.  This has been going on since Christmas of last year he states.  Last couple visits has been quite high. - Currently on clonidine 0.3 mg 3 times a day,  Coreg 25 mg twice a day hydralazine 10 mg 3 times a day. -He would like to take clonidine 0.23 times a day and clonidine 0.1 3 times a day spaced out in between each other.  He said that this worked better. -I will start amlodipine 5 mg once a day - I will start spironolactone 25 mg once a day - Check basic  metabolic profile in 1 week.-We will also check a TSH and a free T4 given his 20 pound weight loss since Christmas, increased anxiety. - See him back in clinic in 2 weeks.  We will add him in.  Atrial fibrillation - Continue with Coumadin as directed by Coumadin clinic.  Close level monitoring noted.  Last INR 1.8, creatinine 1.5.  Well rate controlled with carvedilol currently.  If heart rate does increase, he does have additional metoprolol 25 mg to take.  Chest pain - May be related to uncontrolled blood pressure.  Troponins were normal.  No ischemic evaluation performed in March 2024 admission  Chronic kidney disease stage IIIa - Creatinine 1.5 last check potassium 4.7.  We are starting spironolactone.  We will check basic metabolic profile in 1 week.  Stop if there are any adverse labs.  Moderately reduced ejection fraction 45 to 50% on echocardiogram 1/24  High medical complexity.       Dispo: As above  Signed, Donato Schultz, MD

## 2022-10-20 NOTE — Patient Instructions (Signed)
Medication Instructions:  Stop clonidine 0.3mg . Start clonidine 0.2 mg three times a day. Start clonidine 0.1 mg three times a day between 0.2 mg dose. Start amlodipine 5 mg once a day. Start spironolactone 25 mg once a day. *If you need a refill on your cardiac medications before your next appointment, please call your pharmacy*   Lab Work: Bmet in 1 week.   If you have labs (blood work) drawn today and your tests are completely normal, you will receive your results only by: MyChart Message (if you have MyChart) OR A paper copy in the mail If you have any lab test that is abnormal or we need to change your treatment, we will call you to review the results.    Follow-Up: At [facility], you and your health needs are our priority.  As part of our continuing mission to provide you with exceptional heart care, we have created designated Provider Care Teams.  These Care Teams include your primary Cardiologist (physician) and Advanced Practice Providers (APPs -  Physician Assistants and Nurse Practitioners) who all work together to provide you with the care you need, when you need it.  We recommend signing up for the patient portal called "MyChart".  Sign up information is provided on this After Visit Summary.  MyChart is used to connect with patients for Virtual Visits (Telemedicine).  Patients are able to view lab/test results, encounter notes, upcoming appointments, etc.  Non-urgent messages can be sent to your provider as well.   To learn more about what you can do with MyChart, go to [email or web address].    Your next appointment:   2 week(s)  Provider:   Donato Schultz, MD

## 2022-10-28 ENCOUNTER — Ambulatory Visit: Payer: Medicare HMO | Attending: Internal Medicine

## 2022-10-28 DIAGNOSIS — I4819 Other persistent atrial fibrillation: Secondary | ICD-10-CM | POA: Diagnosis not present

## 2022-10-28 DIAGNOSIS — I10 Essential (primary) hypertension: Secondary | ICD-10-CM | POA: Diagnosis not present

## 2022-10-28 LAB — BASIC METABOLIC PANEL
BUN/Creatinine Ratio: 14 (ref 10–24)
BUN: 24 mg/dL (ref 8–27)
CO2: 23 mmol/L (ref 20–29)
Calcium: 10.3 mg/dL — ABNORMAL HIGH (ref 8.6–10.2)
Chloride: 89 mmol/L — ABNORMAL LOW (ref 96–106)
Creatinine, Ser: 1.67 mg/dL — ABNORMAL HIGH (ref 0.76–1.27)
Glucose: 119 mg/dL — ABNORMAL HIGH (ref 70–99)
Potassium: 5.1 mmol/L (ref 3.5–5.2)
Sodium: 128 mmol/L — ABNORMAL LOW (ref 134–144)
eGFR: 42 mL/min/{1.73_m2} — ABNORMAL LOW (ref >59)

## 2022-11-02 ENCOUNTER — Ambulatory Visit: Payer: Medicare HMO | Attending: Cardiology | Admitting: Cardiology

## 2022-11-02 ENCOUNTER — Encounter: Payer: Self-pay | Admitting: Cardiology

## 2022-11-02 VITALS — BP 108/72 | HR 55 | Resp 98 | Ht 74.0 in | Wt 162.0 lb

## 2022-11-02 DIAGNOSIS — I10 Essential (primary) hypertension: Secondary | ICD-10-CM

## 2022-11-02 DIAGNOSIS — N1831 Chronic kidney disease, stage 3a: Secondary | ICD-10-CM | POA: Diagnosis not present

## 2022-11-02 DIAGNOSIS — I4819 Other persistent atrial fibrillation: Secondary | ICD-10-CM

## 2022-11-02 NOTE — Patient Instructions (Signed)
Medication Instructions:  The current medical regimen is effective;  continue present plan and medications.  *If you need a refill on your cardiac medications before your next appointment, please call your pharmacy*  Follow-Up: At [facility], you and your health needs are our priority.  As part of our continuing mission to provide you with exceptional heart care, we have created designated Provider Care Teams.  These Care Teams include your primary Cardiologist (physician) and Advanced Practice Providers (APPs -  Physician Assistants and Nurse Practitioners) who all work together to provide you with the care you need, when you need it.  We recommend signing up for the patient portal called "MyChart".  Sign up information is provided on this After Visit Summary.  MyChart is used to connect with patients for Virtual Visits (Telemedicine).  Patients are able to view lab/test results, encounter notes, upcoming appointments, etc.  Non-urgent messages can be sent to your provider as well.   To learn more about what you can do with MyChart, go to [email or web address].    Your next appointment:   2 month(s)  Provider:   Donato Schultz, MD

## 2022-11-02 NOTE — Progress Notes (Signed)
  Cardiology Office Note:  .   Date:  11/02/2022  ID:  Ralph Sullivan, DOB 1945-10-15, MRN [id number] PCP: Lorenda Ishihara, MD  [facility] Providers Cardiologist:  Donato Schultz, MD    History of Present Illness: .   Ralph Sullivan is a 77 y.o. male here for close 2-week follow-up for challenging to control hypertension.  2 weeks ago started amlodipine and spironolactone.  Blood pressure today much better 108/72.  He is doing better.  He does have some bilateral lower extremity pain greater on the right lateral calf region.  He is using muscle cream.  Back in March or 2024 admitted with chest pain.  Troponins were normal.  Metoprolol was changed to carvedilol, however he never started the carvedilol.  Feels quite nervous.  Has been dealing with some incontinence.  Has been having 1 more deconditioning.  Utilizes a walker for ambulation.  ROS: No chest pain no syncope no bleeding  Studies Reviewed: .       Echo 04/29/2022: Ejection fraction 45 to 50%, moderately dilated left and right atrium.     Risk Assessment/Calculations:    CHA2DS2-VASc Score = 6   This indicates a 9.7% annual risk of stroke. The patient's score is based upon: CHF History: 1 HTN History: 1 Diabetes History: 0 Stroke History: 2 Vascular Disease History: 1 Age Score: 1 Gender Score: 0           Physical Exam:   VS:  BP 108/72   Pulse (!) 55   Resp (!) 98   Ht 6\' 2"  (1.88 m)   Wt 162 lb (73.5 kg)   BMI 20.80 kg/m    Wt Readings from Last 3 Encounters:  11/02/22 162 lb (73.5 kg)  10/20/22 160 lb 12.8 oz (72.9 kg)  07/23/22 170 lb (77.1 kg)    GEN: Thinner in no acute distress, utilizing NECK: No JVD; No carotid bruits CARDIAC: Irregularly irregular no murmurs, rubs, gallops RESPIRATORY:  Clear to auscultation without rales, wheezing or rhonchi  ABDOMEN: Soft, non-tender, non-distended EXTREMITIES:  No edema; No deformity   ASSESSMENT AND PLAN: .    Primary hypertension which is  difficult to control - Started spironolactone 25 mg and amlodipine 5 mg last visit -Basic metabolic profile was stable.  TSH  Free T4 normal. -Blood pressure much better.  Persistent atrial fibrillation - Not taking carvedilol.  Just taking low-dose metoprolol 25 mg twice a day as needed elevated heart rate.    Chronic anticoagulation - Coumadin clinic.  Stable.  Labs closely monitored.  Chronic kidney disease stage IIIa - Creatinine 1.67, stable.  Potassium 5.1.  He knows to stay away from foods high in potassium.  Hyponatremia - Between 128 and 131.  Has been fairly stable.  Reset osmostat.  Primary team following as well.  Chest x-ray in March or 2024 showed no acute findings.       Dispo: 41-month follow-up  Signed, Donato Schultz, MD

## 2022-11-09 ENCOUNTER — Ambulatory Visit: Payer: Medicare HMO

## 2022-11-12 ENCOUNTER — Ambulatory Visit: Payer: Medicare HMO | Attending: Internal Medicine

## 2022-11-12 DIAGNOSIS — Z5181 Encounter for therapeutic drug level monitoring: Secondary | ICD-10-CM

## 2022-11-12 DIAGNOSIS — I4819 Other persistent atrial fibrillation: Secondary | ICD-10-CM | POA: Diagnosis not present

## 2022-11-12 LAB — POCT INR: INR: 2.7 (ref 2.0–3.0)

## 2022-11-12 NOTE — Patient Instructions (Signed)
Description   Continue taking Warfarin 1 tablet daily except for 1.5 tablets on Tuesdays.   Recheck INR in 6 weeks.  Drinking 2 boost daily.  Call us with any medication changes or questions to Coumadin Clinic # [phone].

## 2022-12-29 ENCOUNTER — Ambulatory Visit: Payer: Medicare HMO | Attending: Cardiology

## 2022-12-29 DIAGNOSIS — Z5181 Encounter for therapeutic drug level monitoring: Secondary | ICD-10-CM | POA: Diagnosis not present

## 2022-12-29 DIAGNOSIS — I4819 Other persistent atrial fibrillation: Secondary | ICD-10-CM

## 2022-12-29 LAB — POCT INR: INR: 2 (ref 2.0–3.0)

## 2022-12-29 NOTE — Patient Instructions (Signed)
Continue taking Warfarin 1 tablet daily except for 1.5 tablets on Tuesdays.   Recheck INR in 6 weeks.  Drinking 2 boost daily.  Call us with any medication changes or questions to Coumadin Clinic # [phone].

## 2022-12-31 ENCOUNTER — Telehealth: Payer: Self-pay | Admitting: Cardiology

## 2022-12-31 DIAGNOSIS — I5022 Chronic systolic (congestive) heart failure: Secondary | ICD-10-CM | POA: Diagnosis not present

## 2022-12-31 DIAGNOSIS — E1169 Type 2 diabetes mellitus with other specified complication: Secondary | ICD-10-CM | POA: Diagnosis not present

## 2022-12-31 DIAGNOSIS — I130 Hypertensive heart and chronic kidney disease with heart failure and stage 1 through stage 4 chronic kidney disease, or unspecified chronic kidney disease: Secondary | ICD-10-CM | POA: Diagnosis not present

## 2022-12-31 DIAGNOSIS — E1122 Type 2 diabetes mellitus with diabetic chronic kidney disease: Secondary | ICD-10-CM | POA: Diagnosis not present

## 2022-12-31 DIAGNOSIS — F411 Generalized anxiety disorder: Secondary | ICD-10-CM | POA: Diagnosis not present

## 2022-12-31 DIAGNOSIS — I4819 Other persistent atrial fibrillation: Secondary | ICD-10-CM | POA: Diagnosis not present

## 2022-12-31 DIAGNOSIS — I6930 Unspecified sequelae of cerebral infarction: Secondary | ICD-10-CM | POA: Diagnosis not present

## 2022-12-31 DIAGNOSIS — N1831 Chronic kidney disease, stage 3a: Secondary | ICD-10-CM | POA: Diagnosis not present

## 2022-12-31 DIAGNOSIS — M19049 Primary osteoarthritis, unspecified hand: Secondary | ICD-10-CM | POA: Diagnosis not present

## 2022-12-31 NOTE — Telephone Encounter (Signed)
Call came in through for DOD. Dr. Barbra Sarks office called and they just needed to relay a message to Dr. Anne Fu. Dr. Chales Salmon saw patient today and patient was complaining of sweaty palms, sweaty palms is a side effect of metoprolol and patient is wanting to change to something else. Patient has an appointment with Dr. Anne Fu on Monday. Will forward to Dr. Anne Fu and his nurse so they can discuss this at his appointment on Monday.

## 2022-12-31 NOTE — Telephone Encounter (Signed)
Called back to [employer].  Held to speak w Dr. Chales Salmon or representative for >12 minutes.  Hung up.   This is probably a DOD call.  Message back to scheduling to send to DOD if call back.

## 2022-12-31 NOTE — Telephone Encounter (Signed)
Dr. Chales Salmon would like to speak with a nurse or the doctor regarding this pt. Please advise

## 2023-01-01 NOTE — Telephone Encounter (Signed)
Left message for patient to call back  

## 2023-01-04 ENCOUNTER — Encounter: Payer: Self-pay | Admitting: Cardiology

## 2023-01-04 ENCOUNTER — Ambulatory Visit: Payer: Medicare HMO | Attending: Cardiology | Admitting: Cardiology

## 2023-01-04 VITALS — BP 124/72 | HR 71 | Ht 72.0 in | Wt 155.2 lb

## 2023-01-04 DIAGNOSIS — R634 Abnormal weight loss: Secondary | ICD-10-CM

## 2023-01-04 DIAGNOSIS — I4819 Other persistent atrial fibrillation: Secondary | ICD-10-CM | POA: Diagnosis not present

## 2023-01-04 DIAGNOSIS — R61 Generalized hyperhidrosis: Secondary | ICD-10-CM

## 2023-01-04 NOTE — Patient Instructions (Signed)
Medication Instructions:  The current medical regimen is effective;  continue present plan and medications.  *If you need a refill on your cardiac medications before your next appointment, please call your pharmacy*   Lab Work: Please have blood work today  (TSH, Free T4)  If you have labs (blood work) drawn today and your tests are completely normal, you will receive your results only by: MyChart Message (if you have MyChart) OR A paper copy in the mail If you have any lab test that is abnormal or we need to change your treatment, we will call you to review the results.  Follow-Up: At [facility], you and your health needs are our priority.  As part of our continuing mission to provide you with exceptional heart care, we have created designated Provider Care Teams.  These Care Teams include your primary Cardiologist (physician) and Advanced Practice Providers (APPs -  Physician Assistants and Nurse Practitioners) who all work together to provide you with the care you need, when you need it.  We recommend signing up for the patient portal called "MyChart".  Sign up information is provided on this After Visit Summary.  MyChart is used to connect with patients for Virtual Visits (Telemedicine).  Patients are able to view lab/test results, encounter notes, upcoming appointments, etc.  Non-urgent messages can be sent to your provider as well.   To learn more about what you can do with MyChart, go to [email or web address].    Your next appointment:   6 month(s)  Provider:   Donato Schultz, MD

## 2023-01-04 NOTE — Telephone Encounter (Signed)
Left message for pt that we will review this information with him at his appt today.  Requested if he is not coming to the appt today to call back and let us know.

## 2023-01-04 NOTE — Telephone Encounter (Signed)
Patient was returning phone call 

## 2023-01-04 NOTE — Progress Notes (Signed)
  Cardiology Office Note:  .   Date:  01/04/2023  ID:  Ralph Sullivan, DOB July 16, 1945, MRN [id number] PCP: Lorenda Ishihara, MD  [facility] Providers Cardiologist:  Donato Schultz, MD    History of Present Illness: .   Ralph Sullivan is a 77 y.o. male Discussed the use of AI scribe software for clinical note transcription with the patient, who gave verbal consent to proceed.  History of Present Illness   The patient, with a history of atrial fibrillation managed with metoprolol, presents with excessive sweating, particularly of the hands, and difficulty walking. The sweating is so severe that it interferes with his ability to hold objects and sign his name. He has tried various lotions without relief. The patient also reports difficulty walking, which has led to a decrease in his usual activities.  Two years and is progressively worsening. Despite various medications, the swelling has not improved. He also mentions experiencing chills and cold hands.       ROS: No CP,. No SOB.   Studies Reviewed: .        EF 45-50%  (2024) Risk Assessment/Calculations:    CHA2DS2-VASc Score = 6   This indicates a 9.7% annual risk of stroke. The patient's score is based upon: CHF History: 1 HTN History: 1 Diabetes History: 0 Stroke History: 2 Vascular Disease History: 1 Age Score: 1 Gender Score: 0            Physical Exam:   VS:  BP 124/72   Pulse 71   Ht 6' (1.829 m)   Wt 155 lb 3.2 oz (70.4 kg)   SpO2 97%   BMI 21.05 kg/m    Wt Readings from Last 3 Encounters:  01/04/23 155 lb 3.2 oz (70.4 kg)  11/02/22 162 lb (73.5 kg)  10/20/22 160 lb 12.8 oz (72.9 kg)    GEN: Thin in no acute distress NECK: No JVD; No carotid bruits CARDIAC: IRRR, no murmurs, rubs, gallops RESPIRATORY:  Clear to auscultation without rales, wheezing or rhonchi  ABDOMEN: Soft, non-tender, non-distended EXTREMITIES:  No edema; No deformity, sweaty palms   ASSESSMENT AND PLAN: .   Assessment and  Plan    Hyperhidrosis Excessive sweating of the hands, worsening over the past two years. No improvement with lotions. Possible side effect of Metoprolol. -Reduce Metoprolol dose to 0.5-1 tablet daily. OK to see if this reduces symptoms.  -Check thyroid function to rule out hyperthyroidism as a cause of hyperhidrosis. Could try over the counter antipers.   Atrial Fibrillation Well controlled on current medication regimen. -Continue current management. If HR increases ok to take metoprolol again.   Hypertension Blood pressure well controlled. -Continue current management. Clonidine -amlodipine, spironolactone           Dispo: 6 mth  Signed, Donato Schultz, MD

## 2023-01-04 NOTE — Telephone Encounter (Signed)
Reviewed with pt at his appt today.

## 2023-01-05 LAB — TSH: TSH: 1.66 u[IU]/mL (ref 0.450–4.500)

## 2023-01-05 LAB — T4, FREE: Free T4: 1.2 ng/dL (ref 0.82–1.77)

## 2023-01-15 ENCOUNTER — Other Ambulatory Visit: Payer: Self-pay | Admitting: Cardiology

## 2023-01-15 DIAGNOSIS — Z5181 Encounter for therapeutic drug level monitoring: Secondary | ICD-10-CM

## 2023-01-15 DIAGNOSIS — I4819 Other persistent atrial fibrillation: Secondary | ICD-10-CM

## 2023-01-18 ENCOUNTER — Telehealth: Payer: Self-pay | Admitting: Cardiology

## 2023-01-18 NOTE — Telephone Encounter (Signed)
Pt c/o medication issue:  1. Name of Medication: hydrALAZINE (APRESOLINE) 10 MG tablet   2. How are you currently taking this medication (dosage and times per day)? Not currently taking   3. Are you having a reaction (difficulty breathing--STAT)? No   4. What is your medication issue? Patient is calling stating [facility] contacted him about this medication, but he states he is not and has not been taking it. He is wanting confirmation that he shouldn't be. Please advise.

## 2023-01-18 NOTE — Telephone Encounter (Signed)
Left a message for the pt to call back.

## 2023-01-25 NOTE — Telephone Encounter (Signed)
Attempted to contact pt to review medications.  No answer.  Left message for him that he is to be taking hydralazine 10 mg one tablet three times a day.  Advised he has any further questions or concerns to call back.

## 2023-01-29 NOTE — Telephone Encounter (Signed)
Pt reports hydralazine makes him swimmy-headed and he is not taking it.  Removed from pt's medication list.  Reports leg pain that his PCP advised is r/t arthritis.  He states nothing seems to be helping it.  Advised pt to f/u with PCP for further evaluation and possible treatment.   He was appreciative of the call back and information.

## 2023-02-09 ENCOUNTER — Ambulatory Visit: Payer: Medicare HMO

## 2023-02-15 ENCOUNTER — Ambulatory Visit: Payer: Medicare HMO

## 2023-02-18 ENCOUNTER — Ambulatory Visit: Payer: Medicare HMO

## 2023-02-25 ENCOUNTER — Encounter (HOSPITAL_COMMUNITY): Payer: Self-pay

## 2023-02-25 ENCOUNTER — Emergency Department (HOSPITAL_COMMUNITY): Payer: Medicare HMO

## 2023-02-25 ENCOUNTER — Inpatient Hospital Stay (HOSPITAL_COMMUNITY)
Admission: EM | Admit: 2023-02-25 | Discharge: 2023-03-08 | DRG: 309 | Disposition: A | Discharge status: Skilled Nursing Facility | Payer: Medicare HMO | Attending: Internal Medicine | Admitting: Internal Medicine

## 2023-02-25 ENCOUNTER — Other Ambulatory Visit: Payer: Self-pay

## 2023-02-25 DIAGNOSIS — I452 Bifascicular block: Secondary | ICD-10-CM | POA: Diagnosis present

## 2023-02-25 DIAGNOSIS — R531 Weakness: Principal | ICD-10-CM

## 2023-02-25 DIAGNOSIS — R7881 Bacteremia: Secondary | ICD-10-CM

## 2023-02-25 DIAGNOSIS — N136 Pyonephrosis: Secondary | ICD-10-CM | POA: Diagnosis not present

## 2023-02-25 DIAGNOSIS — B952 Enterococcus as the cause of diseases classified elsewhere: Secondary | ICD-10-CM | POA: Diagnosis not present

## 2023-02-25 DIAGNOSIS — K56600 Partial intestinal obstruction, unspecified as to cause: Secondary | ICD-10-CM | POA: Diagnosis not present

## 2023-02-25 DIAGNOSIS — N1831 Chronic kidney disease, stage 3a: Secondary | ICD-10-CM | POA: Diagnosis present

## 2023-02-25 DIAGNOSIS — R231 Pallor: Secondary | ICD-10-CM | POA: Diagnosis not present

## 2023-02-25 DIAGNOSIS — G629 Polyneuropathy, unspecified: Secondary | ICD-10-CM | POA: Diagnosis present

## 2023-02-25 DIAGNOSIS — G6289 Other specified polyneuropathies: Secondary | ICD-10-CM

## 2023-02-25 DIAGNOSIS — Z888 Allergy status to other drugs, medicaments and biological substances status: Secondary | ICD-10-CM

## 2023-02-25 DIAGNOSIS — B957 Other staphylococcus as the cause of diseases classified elsewhere: Secondary | ICD-10-CM | POA: Diagnosis present

## 2023-02-25 DIAGNOSIS — Z6821 Body mass index (BMI) 21.0-21.9, adult: Secondary | ICD-10-CM

## 2023-02-25 DIAGNOSIS — Z23 Encounter for immunization: Secondary | ICD-10-CM

## 2023-02-25 DIAGNOSIS — Z8249 Family history of ischemic heart disease and other diseases of the circulatory system: Secondary | ICD-10-CM

## 2023-02-25 DIAGNOSIS — I493 Ventricular premature depolarization: Secondary | ICD-10-CM | POA: Diagnosis present

## 2023-02-25 DIAGNOSIS — I5022 Chronic systolic (congestive) heart failure: Secondary | ICD-10-CM | POA: Diagnosis present

## 2023-02-25 DIAGNOSIS — Z743 Need for continuous supervision: Secondary | ICD-10-CM | POA: Diagnosis not present

## 2023-02-25 DIAGNOSIS — R000 Tachycardia, unspecified: Secondary | ICD-10-CM | POA: Diagnosis not present

## 2023-02-25 DIAGNOSIS — Z87891 Personal history of nicotine dependence: Secondary | ICD-10-CM

## 2023-02-25 DIAGNOSIS — I4891 Unspecified atrial fibrillation: Secondary | ICD-10-CM | POA: Diagnosis present

## 2023-02-25 DIAGNOSIS — Z885 Allergy status to narcotic agent status: Secondary | ICD-10-CM

## 2023-02-25 DIAGNOSIS — R6883 Chills (without fever): Secondary | ICD-10-CM | POA: Diagnosis not present

## 2023-02-25 DIAGNOSIS — M109 Gout, unspecified: Secondary | ICD-10-CM | POA: Diagnosis present

## 2023-02-25 DIAGNOSIS — Z87440 Personal history of urinary (tract) infections: Secondary | ICD-10-CM

## 2023-02-25 DIAGNOSIS — Z1152 Encounter for screening for COVID-19: Secondary | ICD-10-CM

## 2023-02-25 DIAGNOSIS — R634 Abnormal weight loss: Secondary | ICD-10-CM | POA: Diagnosis present

## 2023-02-25 DIAGNOSIS — Z7901 Long term (current) use of anticoagulants: Secondary | ICD-10-CM

## 2023-02-25 DIAGNOSIS — R001 Bradycardia, unspecified: Secondary | ICD-10-CM | POA: Diagnosis not present

## 2023-02-25 DIAGNOSIS — Z87442 Personal history of urinary calculi: Secondary | ICD-10-CM

## 2023-02-25 DIAGNOSIS — I4510 Unspecified right bundle-branch block: Secondary | ICD-10-CM | POA: Diagnosis not present

## 2023-02-25 DIAGNOSIS — N200 Calculus of kidney: Secondary | ICD-10-CM | POA: Diagnosis present

## 2023-02-25 DIAGNOSIS — Z833 Family history of diabetes mellitus: Secondary | ICD-10-CM

## 2023-02-25 DIAGNOSIS — Z79899 Other long term (current) drug therapy: Secondary | ICD-10-CM

## 2023-02-25 DIAGNOSIS — R61 Generalized hyperhidrosis: Secondary | ICD-10-CM | POA: Diagnosis present

## 2023-02-25 DIAGNOSIS — I130 Hypertensive heart and chronic kidney disease with heart failure and stage 1 through stage 4 chronic kidney disease, or unspecified chronic kidney disease: Secondary | ICD-10-CM | POA: Diagnosis present

## 2023-02-25 DIAGNOSIS — I420 Dilated cardiomyopathy: Secondary | ICD-10-CM | POA: Diagnosis present

## 2023-02-25 DIAGNOSIS — I4821 Permanent atrial fibrillation: Secondary | ICD-10-CM | POA: Diagnosis not present

## 2023-02-25 DIAGNOSIS — Z8673 Personal history of transient ischemic attack (TIA), and cerebral infarction without residual deficits: Secondary | ICD-10-CM

## 2023-02-25 LAB — URINALYSIS, ROUTINE W REFLEX MICROSCOPIC
Bacteria, UA: NONE SEEN
Bilirubin Urine: NEGATIVE
Glucose, UA: NEGATIVE mg/dL
Hgb urine dipstick: NEGATIVE
Ketones, ur: 5 mg/dL — AB
Leukocytes,Ua: NEGATIVE
Nitrite: NEGATIVE
Protein, ur: 100 mg/dL — AB
Specific Gravity, Urine: 1.014 (ref 1.005–1.030)
pH: 5 (ref 5.0–8.0)

## 2023-02-25 LAB — CBC
HCT: 36.2 % — ABNORMAL LOW (ref 39.0–52.0)
Hemoglobin: 12.1 g/dL — ABNORMAL LOW (ref 13.0–17.0)
MCH: 32.9 pg (ref 26.0–34.0)
MCHC: 33.4 g/dL (ref 30.0–36.0)
MCV: 98.4 fL (ref 80.0–100.0)
Platelets: 181 10*3/uL (ref 150–400)
RBC: 3.68 MIL/uL — ABNORMAL LOW (ref 4.22–5.81)
RDW: 13.6 % (ref 11.5–15.5)
WBC: 7.7 10*3/uL (ref 4.0–10.5)
nRBC: 0 % (ref 0.0–0.2)

## 2023-02-25 LAB — CBG MONITORING, ED: Glucose-Capillary: 124 mg/dL — ABNORMAL HIGH (ref 70–99)

## 2023-02-25 LAB — COMPREHENSIVE METABOLIC PANEL
ALT: 13 U/L (ref 0–44)
AST: 21 U/L (ref 15–41)
Albumin: 3.8 g/dL (ref 3.5–5.0)
Alkaline Phosphatase: 66 U/L (ref 38–126)
Anion gap: 13 (ref 5–15)
BUN: 20 mg/dL (ref 8–23)
CO2: 22 mmol/L (ref 22–32)
Calcium: 9.9 mg/dL (ref 8.9–10.3)
Chloride: 96 mmol/L — ABNORMAL LOW (ref 98–111)
Creatinine, Ser: 1.48 mg/dL — ABNORMAL HIGH (ref 0.61–1.24)
GFR, Estimated: 49 mL/min — ABNORMAL LOW (ref >60)
Glucose, Bld: 122 mg/dL — ABNORMAL HIGH (ref 70–99)
Potassium: 4.4 mmol/L (ref 3.5–5.1)
Sodium: 131 mmol/L — ABNORMAL LOW (ref 135–145)
Total Bilirubin: 0.9 mg/dL (ref 0.3–1.2)
Total Protein: 6.7 g/dL (ref 6.5–8.1)

## 2023-02-25 LAB — PROTIME-INR
INR: 2.4 — ABNORMAL HIGH (ref 0.8–1.2)
Prothrombin Time: 26.3 s — ABNORMAL HIGH (ref 11.4–15.2)

## 2023-02-25 LAB — RESP PANEL BY RT-PCR (RSV, FLU A&B, COVID)  RVPGX2
Influenza A by PCR: NEGATIVE
Influenza B by PCR: NEGATIVE
Resp Syncytial Virus by PCR: NEGATIVE
SARS Coronavirus 2 by RT PCR: NEGATIVE

## 2023-02-25 LAB — BRAIN NATRIURETIC PEPTIDE: B Natriuretic Peptide: 194.1 pg/mL — ABNORMAL HIGH (ref 0.0–100.0)

## 2023-02-25 LAB — TROPONIN I (HIGH SENSITIVITY)
Troponin I (High Sensitivity): 15 ng/L (ref <18)
Troponin I (High Sensitivity): 13 ng/L (ref <18)

## 2023-02-25 MED ORDER — POLYVINYL ALCOHOL 1.4 % OP SOLN
1.0000 [drp] | Freq: Two times a day (BID) | OPHTHALMIC | Status: DC | PRN
  Administered 2023-03-07: 1 [drp] via OPHTHALMIC

## 2023-02-25 MED ORDER — METOPROLOL TARTRATE 25 MG PO TABS: 25.0000 mg | ORAL_TABLET | Freq: Every day | ORAL | Status: DC | PRN

## 2023-02-25 MED ORDER — OMEGA-3-ACID ETHYL ESTERS 1 G PO CAPS
1.0000 | ORAL_CAPSULE | Freq: Two times a day (BID) | ORAL | Status: DC
Start: 2023-02-26 — End: 2023-02-27
  Administered 2023-02-26 – 2023-02-27 (×3): 1 g via ORAL
  Filled 2023-02-25 (×6): qty 1

## 2023-02-25 MED ORDER — VITAMIN E 45 MG (100 UNIT) PO CAPS
400.0000 [IU] | ORAL_CAPSULE | Freq: Every day | ORAL | Status: DC
  Administered 2023-02-27: 400 [IU] via ORAL
  Filled 2023-02-25 (×2): qty 4

## 2023-02-25 MED ORDER — BOOST PO LIQD
237.0000 mL | Freq: Two times a day (BID) | ORAL | Status: DC
Start: 2023-02-26 — End: 2023-02-27
  Administered 2023-02-26 – 2023-02-27 (×3): 237 mL via ORAL
  Filled 2023-02-25 (×5): qty 237

## 2023-02-25 MED ORDER — VITAMIN B-12 100 MCG PO TABS
500.0000 ug | ORAL_TABLET | Freq: Every day | ORAL | Status: DC
Start: 2023-02-25 — End: 2023-02-27
  Administered 2023-02-26 – 2023-02-27 (×3): 500 ug via ORAL
  Filled 2023-02-25: qty 1
  Filled 2023-02-25 (×2): qty 5

## 2023-02-25 MED ORDER — CLONIDINE HCL 0.2 MG PO TABS
0.2000 mg | ORAL_TABLET | Freq: Three times a day (TID) | ORAL | Status: DC
  Administered 2023-02-26 – 2023-02-28 (×7): 0.2 mg via ORAL
  Filled 2023-02-25 (×8): qty 1

## 2023-02-25 MED ORDER — FLUTICASONE PROPIONATE 50 MCG/ACT NA SUSP
2.0000 | Freq: Every day | NASAL | Status: DC
  Administered 2023-02-26 – 2023-03-08 (×11): 2 via NASAL
  Filled 2023-02-25: qty 16

## 2023-02-25 MED ORDER — WARFARIN SODIUM 5 MG PO TABS: 5.0000 mg | ORAL_TABLET | Freq: Every day | ORAL | Status: DC

## 2023-02-25 MED ORDER — LORATADINE 10 MG PO TABS
10.0000 mg | ORAL_TABLET | Freq: Every day | ORAL | Status: DC
  Administered 2023-02-26 – 2023-02-27 (×2): 10 mg via ORAL
  Filled 2023-02-25 (×2): qty 1

## 2023-02-25 MED ORDER — CLONIDINE HCL 0.1 MG PO TABS: 0.1000 mg | ORAL_TABLET | Freq: Three times a day (TID) | ORAL | Status: DC

## 2023-02-25 MED ORDER — WARFARIN SODIUM 5 MG PO TABS
5.0000 mg | ORAL_TABLET | Freq: Once | ORAL | Status: AC
  Administered 2023-02-26: 5 mg via ORAL
  Filled 2023-02-25: qty 1

## 2023-02-25 MED ORDER — LIDOCAINE 5 % EX PTCH
1.0000 | MEDICATED_PATCH | CUTANEOUS | Status: DC
  Administered 2023-02-25 – 2023-03-07 (×9): 1 via TRANSDERMAL
  Filled 2023-02-25 (×9): qty 1

## 2023-02-25 MED ORDER — MELATONIN 3 MG PO TABS
3.0000 mg | ORAL_TABLET | Freq: Every day | ORAL | Status: DC
  Administered 2023-02-26 – 2023-02-27 (×3): 3 mg via ORAL
  Filled 2023-02-25 (×3): qty 1

## 2023-02-25 MED ORDER — ACETAMINOPHEN 500 MG PO TABS
1000.0000 mg | ORAL_TABLET | Freq: Four times a day (QID) | ORAL | Status: DC | PRN
  Administered 2023-02-25 – 2023-02-27 (×3): 1000 mg via ORAL
  Filled 2023-02-25 (×3): qty 2

## 2023-02-25 NOTE — ED Notes (Signed)
Pt transferred to ED 45 and received for care at this time.  Bed in lowest position, wheels locked.  Call bell within reach.

## 2023-02-25 NOTE — ED Triage Notes (Signed)
Generalized weakness since yesterday. Reports ongoing sweating to both hands for 6 months. Pt reports trouble walking. Afib on the monitor with EMS. HR increased from 80 to 130 from sitting to standing position. Alert and oriented x 4.

## 2023-02-25 NOTE — ED Provider Notes (Signed)
[place] EMERGENCY DEPARTMENT AT [facility] Provider Note   CSN: [id number] Arrival date & time: 02/25/23  1233     History  Chief Complaint  Patient presents with   Weakness    Ralph Sullivan is a 77 y.o. male.  77 year old male with a history of atrial fibrillation on metoprolol and warfarin, hyperhidrosis of the hands, and CKD who presents to the emergency department with multiple complaints.  Patient reports that he has been having generalized weakness for 1 to 2 weeks.  Says that he thinks he has some sort of virus.  Nausea but no vomiting.  No diarrhea.  Has been having chills at home.  Says that he has numbness of his feet that has been present for years but that is getting worse recently.  Says that while they are in the waiting room he started having substernal chest pressure.  No history of stents or MI.  Per chart review has been having sweating in his hands for years which she is saying is also quite bothersome.  Lives at home with people that will come to check on him frequently.  Ambulates with a walker.       Home Medications Prior to Admission medications   Medication Sig Start Date End Date Taking? Authorizing Provider  acetaminophen (TYLENOL) 325 MG suppository Place 325 mg rectally every 4 (four) hours as needed.    [provider]  allopurinol (ZYLOPRIM) 100 MG tablet Take 100 mg by mouth daily.    [provider]  amLODipine (NORVASC) 5 MG tablet Take 1 tablet (5 mg total) by mouth daily. 10/20/22   Jake Bathe, MD  CINNAMON PO Take 1,000 mg by mouth in the morning and at bedtime.    [provider]  cloNIDine (CATAPRES) 0.1 MG tablet Take 1 tablet (0.1 mg total) by mouth 3 (three) times daily. Take between 0.2mg  dose. 10/20/22   Jake Bathe, MD  cloNIDine (CATAPRES) 0.2 MG tablet Take 1 tablet (0.2 mg total) by mouth 3 (three) times daily. 10/20/22   Jake Bathe, MD  furosemide (LASIX) 20 MG tablet Take by mouth.  09/22/22   [provider]  lactose free nutrition (BOOST) LIQD Take 237 mLs by mouth 2 (two) times daily between meals.    [provider]  loratadine (CLARITIN) 10 MG tablet Take 10 mg by mouth daily as needed for allergies.    [provider]  metoprolol tartrate (LOPRESSOR) 25 MG tablet Take 25 mg by mouth 2 (two) times daily as needed. 09/22/22   [provider]  Omega-3 Fatty Acids (OMEGA 3 PO) Take 1 capsule by mouth 2 (two) times daily.     [provider]  spironolactone (ALDACTONE) 25 MG tablet Take 1 tablet (25 mg total) by mouth daily. 10/20/22   Jake Bathe, MD  vitamin E 400 UNIT capsule Take 400 Units by mouth daily.    [provider]  warfarin (COUMADIN) 5 MG tablet TAKE 1 TABLET BY MOUTH ONCE DAILY OR AS DIRECTED BY THE COUMADIN CLINIC 01/15/23   Jake Bathe, MD      Allergies    Allopurinol, Colchicine, Duloxetine, Elemental sulfur, and Oxycontin [oxycodone hcl]    Review of Systems   Review of Systems  Physical Exam Updated Vital Signs BP (!) 139/99   Pulse (!) 114   Temp 97.6 F (36.4 C) (Oral)   Resp (!) 21   Ht 6' (1.829 m)   Wt 70.4  kg   SpO2 100%   BMI 21.05 kg/m  Physical Exam Vitals and nursing note reviewed.  Constitutional:      General: He is not in acute distress.    Appearance: He is well-developed.  HENT:     Head: Normocephalic and atraumatic.     Right Ear: External ear normal.     Left Ear: External ear normal.     Nose: Nose normal.  Eyes:     Extraocular Movements: Extraocular movements intact.     Conjunctiva/sclera: Conjunctivae normal.     Pupils: Pupils are equal, round, and reactive to light.  Cardiovascular:     Rate and Rhythm: Normal rate. Rhythm irregular.     Heart sounds: Normal heart sounds.  Pulmonary:     Effort: Pulmonary effort is normal. No respiratory distress.     Breath sounds: Normal breath sounds.  Abdominal:     General: There is no distension.      Palpations: There is no mass.     Tenderness: There is no abdominal tenderness. There is no guarding.  Musculoskeletal:     Cervical back: Normal range of motion and neck supple.     Right lower leg: Edema (1+) present.     Left lower leg: Edema (1+) present.  Skin:    General: Skin is warm and dry.  Neurological:     Mental Status: He is alert and oriented to person, place, and time. Mental status is at baseline.     Cranial Nerves: No cranial nerve deficit.     Sensory: Sensory deficit (Diminished sensation to light touch in bilateral feet) present.     Motor: No weakness.  Psychiatric:        Mood and Affect: Mood normal.        Behavior: Behavior normal.     ED Results / Procedures / Treatments   Labs (all labs ordered are listed, but only abnormal results are displayed) Labs Reviewed  CBC - Abnormal; Notable for the following components:      Result Value   RBC 3.68 (*)    Hemoglobin 12.1 (*)    HCT 36.2 (*)    All other components within normal limits  URINALYSIS, ROUTINE W REFLEX MICROSCOPIC - Abnormal; Notable for the following components:   Ketones, ur 5 (*)    Protein, ur 100 (*)    All other components within normal limits  COMPREHENSIVE METABOLIC PANEL - Abnormal; Notable for the following components:   Sodium 131 (*)    Chloride 96 (*)    Glucose, Bld 122 (*)    Creatinine, Ser 1.48 (*)    GFR, Estimated 49 (*)    All other components within normal limits  BRAIN NATRIURETIC PEPTIDE - Abnormal; Notable for the following components:   B Natriuretic Peptide 194.1 (*)    All other components within normal limits  PROTIME-INR - Abnormal; Notable for the following components:   Prothrombin Time 26.3 (*)    INR 2.4 (*)    All other components within normal limits  CBG MONITORING, ED - Abnormal; Notable for the following components:   Glucose-Capillary 124 (*)    All other components within normal limits  RESP PANEL BY RT-PCR (RSV, FLU A&B, COVID)  [id number]   TROPONIN I (HIGH SENSITIVITY)  TROPONIN I (HIGH SENSITIVITY)    EKG EKG Interpretation Date/Time:  Thursday February 25 2023 15:12:04 EDT Ventricular Rate:  88 PR Interval:    QRS Duration:  156 QT Interval:  392 QTC Calculation: 474 R Axis:   -87  Text Interpretation: Atrial fibrillation with premature ventricular or aberrantly conducted complexes Right bundle branch block Left anterior fascicular block Bifascicular block Septal infarct , age undetermined Abnormal ECG Confirmed by Vonita Moss [phone]) on 02/25/2023 4:31:36 PM  Radiology DG Chest 2 View  Result Date: 02/25/2023 CLINICAL DATA:  Chills. EXAM: CHEST - 2 VIEW COMPARISON:  July 23, 2022 FINDINGS: The cardiac silhouette is within normal limits. There is marked severity tortuosity of the descending thoracic aorta. Both lungs are clear. Multilevel degenerative changes are noted throughout the thoracic spine. IMPRESSION: No active cardiopulmonary disease. Electronically Signed   By: Aram Candela M.D.   On: 02/25/2023 18:12    Procedures Procedures    Medications Ordered in ED Medications  melatonin tablet 3 mg (has no administration in time range)    ED Course/ Medical Decision Making/ A&P                                 Medical Decision Making Amount and/or Complexity of Data Reviewed Labs: ordered. Radiology: ordered.  Risk OTC drugs.   Ralph Sullivan is a 77 y.o. male with comorbidities that complicate the patient evaluation including atrial fibrillation on metoprolol and warfarin, hyperhidrosis of the hands, and CKD who presents to the emergency department with multiple complaints.   Initial Ddx:  URI, pneumonia, UTI, electrolyte abnormality, anemia  MDM/Course:  Patient presents emergency department with multiple complaints.  Most pressing appears to be his fatigue and difficulty walking.  Has numbness that has been present for a long period of time of his feet which I suspect is due to  neuropathy.  Has no focal neurologic deficits or other concerning abnormalities.  EKG and serial troponins without signs of acute ischemia.  No UTI.  COVID and flu are negative.  Chest x-ray without pneumonia.  Upon re-evaluation patient was still feeling weak.  Unable to walk with a walker and lives at home by himself.  Patient placed in TOC boarder status.  Will have physical therapy see him in the morning.  This patient presents to the ED for concern of complaints listed in HPI, this involves an extensive number of treatment options, and is a complaint that carries with it a high risk of complications and morbidity. Disposition including potential need for admission considered.   Dispo: [facility]  Records reviewed Outpatient Clinic Notes The following labs were independently interpreted: Urinalysis and show no acute abnormality I independently reviewed the following imaging with scope of interpretation limited to determining acute life threatening conditions related to emergency care: Chest x-ray and agree with the radiologist interpretation with the following exceptions: none I personally reviewed and interpreted cardiac monitoring: atrial fibrillation (normal rate) I personally reviewed and interpreted the pt's EKG: see above for interpretation  I have reviewed the patients home medications and made adjustments as needed Consults: TOC Social Determinants of health:  Elderly  Portions of this note were generated with Scientist, clinical (histocompatibility and immunogenetics). Dictation errors may occur despite best attempts at proofreading.           Final Clinical Impression(s) / ED Diagnoses Final diagnoses:  Weakness  Other polyneuropathy    Rx / DC Orders ED Discharge Orders     None         Rondel Baton, MD 02/25/23 2107

## 2023-02-25 NOTE — ED Notes (Signed)
Provided patient a walker and tried to ambulate but couldn't take more than 2 steps forward before stating he feels his legs are going to give out.

## 2023-02-25 NOTE — ED Notes (Signed)
Pt brought back to triage, because he was c/o left sided chest pain and states the leg numbness bilat that he had at home has returned. We did an EKG on pt and after EKG notified Sarah Ann, [place]

## 2023-02-25 NOTE — ED Provider Triage Note (Addendum)
Emergency Medicine Provider Triage Evaluation Note  Ralph Sullivan , a 77 y.o. male  was evaluated in triage.  Pt complains of generalized weakness that he noticed this morning.  He reports that he was able to walk to bathroom and clean himself up but following that he had difficulty getting out of his wheelchair and needed assistance up to stand.  Review of Systems  Positive: Generalized weakness, dysuria Negative: Fever, hematuria, burning urination, decreased/loss of sensation  Physical Exam  BP 115/84 (BP Location: Right Arm)   Pulse 87   Temp 98.1 F (36.7 C) (Oral)   Resp 15   Ht 6' (1.829 m)   Wt 70.4 kg   SpO2 100%   BMI 21.05 kg/m  Gen:   Awake, no distress .  A&O x 3 Resp:  Normal effort  MSK:   Moves extremities without difficulty.  No numbness, decreased sensation to UEs or Les. No CVA tenderness. Motor equal bilaterally Other:    Medical Decision Making  Medically screening exam initiated at 1:05 PM.  Appropriate orders placed.  Ralph Sullivan was informed that the remainder of the evaluation will be completed by another provider, this initial triage assessment does not replace that evaluation, and the importance of remaining in the ED until their evaluation is complete.  Labwork ordered    Judithann Sheen, PA 02/25/23 1312

## 2023-02-25 NOTE — ED Notes (Signed)
Patient stated he has been constipated x 2 days, and unable to void urine well due to it. Bladder scan showed 340 mL in bladder.

## 2023-02-25 NOTE — ED Notes (Addendum)
Pt requesting something for his back pain.  Med rec being finalized at this time by pharm tech.  This RN attempting to notify ED provider about same.  No current home meds or PRN meds ordered for pt outside of scheduled Melatonin.

## 2023-02-25 NOTE — ED Notes (Signed)
Notified RN Alana of pt chest pain and numbness of legs she said to bring pt back for an EKG.

## 2023-02-26 ENCOUNTER — Ambulatory Visit: Payer: Medicare HMO

## 2023-02-26 DIAGNOSIS — I4811 Longstanding persistent atrial fibrillation: Secondary | ICD-10-CM | POA: Diagnosis not present

## 2023-02-26 DIAGNOSIS — R531 Weakness: Secondary | ICD-10-CM | POA: Diagnosis not present

## 2023-02-26 DIAGNOSIS — I4891 Unspecified atrial fibrillation: Secondary | ICD-10-CM | POA: Diagnosis present

## 2023-02-26 MED ORDER — LACTATED RINGERS IV BOLUS
500.0000 mL | Freq: Once | INTRAVENOUS | Status: AC
Start: 2023-02-26 — End: 2023-02-26
  Administered 2023-02-26: 500 mL via INTRAVENOUS

## 2023-02-26 MED ORDER — METOPROLOL TARTRATE 25 MG PO TABS
12.5000 mg | ORAL_TABLET | Freq: Every day | ORAL | Status: DC | PRN
  Administered 2023-02-26: 12.5 mg via ORAL
  Filled 2023-02-26: qty 1

## 2023-02-26 MED ORDER — WARFARIN - PHARMACIST DOSING INPATIENT: Freq: Every day | Status: DC

## 2023-02-26 MED ORDER — ONDANSETRON HCL 4 MG/2ML IJ SOLN
4.0000 mg | Freq: Four times a day (QID) | INTRAMUSCULAR | Status: DC | PRN
  Administered 2023-03-01 – 2023-03-04 (×6): 4 mg via INTRAVENOUS
  Filled 2023-02-26 (×7): qty 2

## 2023-02-26 MED ORDER — SENNOSIDES-DOCUSATE SODIUM 8.6-50 MG PO TABS: 1.0000 | ORAL_TABLET | Freq: Every evening | ORAL | Status: DC | PRN

## 2023-02-26 MED ORDER — METOPROLOL TARTRATE 12.5 MG HALF TABLET
12.5000 mg | ORAL_TABLET | Freq: Two times a day (BID) | ORAL | Status: DC
  Administered 2023-02-27 (×2): 12.5 mg via ORAL
  Filled 2023-02-26 (×3): qty 1

## 2023-02-26 MED ORDER — WARFARIN - PHYSICIAN DOSING INPATIENT: Freq: Every day | Status: DC

## 2023-02-26 MED ORDER — ONDANSETRON HCL 4 MG PO TABS: 4.0000 mg | ORAL_TABLET | Freq: Four times a day (QID) | ORAL | Status: DC | PRN

## 2023-02-26 NOTE — TOC Initial Note (Signed)
Transition of Care [facility]) - Initial/Assessment Note    Patient Details  Name: Ralph Sullivan MRN: [id number] Date of Birth: 22-Mar-1946  Transition of [facility]) CM/SW Contact:    Susa Simmonds, LCSWA Phone Number: 02/26/2023, 10:25 AM  Clinical Narrative: Patient stated he would be interested in SNF placement if his insurance pays for it. Patient stated he doesn't have much money and barely gets by at times. Patient stated he has no family, but  has a friend Junious Dresser. Patient prefers for CSW not to contact his friend Junious Dresser. Patient also told CSW that if he can walk a little then he rather go home with home health. CSW told patient that she will let physical therapy evaluate him and then he can make his decision. Patient stated he has been to [facility] in the past.              Expected Discharge Plan: Skilled Nursing Facility Barriers to Discharge: Continued Medical Work up   Patient Goals and CMS Choice Patient states their goals for this hospitalization and ongoing recovery are:: SNF or home health          Expected Discharge Plan and Services       Living arrangements for the past 2 months: Single Family Home                                      Prior Living Arrangements/Services Living arrangements for the past 2 months: Single Family Home Lives with:: Self Patient language and need for interpreter reviewed:: Yes Do you feel safe going back to the place where you live?: Yes      Need for Family Participation in Patient Care: No (Comment) Care giver support system in place?: No (comment)   Criminal Activity/Legal Involvement Pertinent to Current Situation/Hospitalization: No - Comment as needed  Activities of Daily Living      Permission Sought/Granted                  Emotional Assessment   Attitude/Demeanor/Rapport: Engaged Affect (typically observed): Accepting Orientation: : Oriented to Self, Oriented to Place, Oriented to  Time, Oriented to  Situation Alcohol / Substance Use: Not Applicable Psych Involvement: No (comment)  Admission diagnosis:  generalized weakness Patient Active Problem List   Diagnosis Date Noted   Chest pain 07/23/2022   Hypercoagulable state due to atrial fibrillation (HCC) 06/17/2021   Chronic systolic heart failure (HCC) 06/17/2021   Left inguinal hernia 07/16/2016   Rhabdomyolysis 10/14/2015   Acute renal failure (ARF) (HCC) 10/14/2015   Anemia 10/14/2015   Lower urinary tract infectious disease 10/14/2015   Atrial fibrillation with rapid ventricular response (HCC) 10/14/2015   Elevated CK    Encounter for therapeutic drug monitoring 08/29/2014   Obesity 03/28/2013   HTN (hypertension) 03/28/2013   Chronic kidney disease, stage III (moderate) (HCC) 03/28/2013   Chronic anticoagulation 03/28/2013   Hyperhidrosis 03/28/2013   Dilated aortic root (HCC) 03/28/2013   Bifascicular block 03/28/2013   Persistent atrial fibrillation (HCC) 02/08/2013   PCP:  Lorenda Ishihara, MD Pharmacy:   [facility] [address], [place] - [address] [place] [address] Phone: [phone] Fax: [phone]  [facility] [address] [place], [place] - [address] [place] [address] Phone: [phone] Fax: [phone]     Social Determinants of  Health (SDOH) Social History: SDOH Screenings   Food Insecurity: No Food Insecurity (07/23/2022)  Housing: Low Risk  (07/23/2022)  Transportation Needs: No Transportation Needs (07/23/2022)  Utilities: Not At Risk (07/23/2022)  Tobacco Use: Medium Risk (02/25/2023)   SDOH Interventions:     Readmission Risk Interventions     No data to display

## 2023-02-26 NOTE — Progress Notes (Signed)
2151- Paged Dr. Joneen Roach regarding patient bp 95/67 and hr 54. Okay to hold metoprolol and clonidine per provider.

## 2023-02-26 NOTE — ED Provider Notes (Signed)
Called to bedside due to tachycardia with standing.  Patient is a 77 year old with history of atrial fibrillation on warfarin, CKD.  He was recently taken off of metoprolol 25 mg only takes it as needed but does not think has been taking it lately.  Heart rate is currently controlled but is resting.  On exam he appears somewhat dry.  He endorses some recent decreased p.o. intake.  Physical therapist evaluated and felt like he is not stable enough when standing.  Will try to restart his metoprolol here and see if this helps with his tachycardia.  He did develop RVR with elevated heart rate to 130s with standing but resolved with sitting again.  Will trial some fluids as well and reassess.  I restarted his metoprolol and give him IV fluids.  Unfortunately he is still going to RVR when he stands.  His heart rate is relatively low when he is resting.  I think he needs further titration and rehydration to get better control of his heart rate and improve his ability to ambulate.  Discussed with hospitalist and admitted.   Laurence Spates, MD 02/26/23 1540

## 2023-02-26 NOTE — [place] FL2 (Signed)
[place] MEDICAID FL2 LEVEL OF CARE FORM     IDENTIFICATION  Patient Name: Ralph Sullivan Birthdate: [birth year] Sex: male Admission Date (Current Location): 02/25/2023  [facility] and [place] Number:  Producer, television/film/video and Address:  [facility], [address], [place], [place] [address]      Provider Number: [id number]  Attending Physician Name and Address:  No att. providers found  Relative Name and Phone Number:  Jalene Mullet [phone], Friend    Current Level of Care: Hospital Recommended Level of Care: Skilled Nursing Facility Prior Approval Number:    Date Approved/Denied:   PASRR Number: [id number]  Discharge Plan: SNF    Current Diagnoses: Patient Active Problem List   Diagnosis Date Noted   Chest pain 07/23/2022   Hypercoagulable state due to atrial fibrillation (HCC) 06/17/2021   Chronic systolic heart failure (HCC) 06/17/2021   Left inguinal hernia 07/16/2016   Rhabdomyolysis 10/14/2015   Acute renal failure (ARF) (HCC) 10/14/2015   Anemia 10/14/2015   Lower urinary tract infectious disease 10/14/2015   Atrial fibrillation with rapid ventricular response (HCC) 10/14/2015   Elevated CK    Encounter for therapeutic drug monitoring 08/29/2014   Obesity 03/28/2013   HTN (hypertension) 03/28/2013   Chronic kidney disease, stage III (moderate) (HCC) 03/28/2013   Chronic anticoagulation 03/28/2013   Hyperhidrosis 03/28/2013   Dilated aortic root (HCC) 03/28/2013   Bifascicular block 03/28/2013   Persistent atrial fibrillation (HCC) 02/08/2013    Orientation RESPIRATION BLADDER Height & Weight     Self, Time, Situation, Place  Normal Continent Weight: 155 lb 3.3 oz (70.4 kg) Height:  6' (182.9 cm)  BEHAVIORAL SYMPTOMS/MOOD NEUROLOGICAL BOWEL NUTRITION STATUS      Continent Diet (Regular)  AMBULATORY STATUS COMMUNICATION OF NEEDS Skin   Extensive Assist Verbally Normal                       Personal Care Assistance  Level of Assistance  Bathing, Feeding, Dressing Bathing Assistance: Maximum assistance Feeding assistance: Independent Dressing Assistance: Maximum assistance     Functional Limitations Info  Sight, Hearing, Speech Sight Info: Adequate Hearing Info: Adequate Speech Info: Adequate    SPECIAL CARE FACTORS FREQUENCY  PT (By licensed PT), OT (By licensed OT)     PT Frequency: 5x week OT Frequency: 5x week            Contractures Contractures Info: Not present    Additional Factors Info  Code Status, Allergies Code Status Info: Full Allergies Info: Allopurinol Colchicine Duloxetine Elemental Sulfur Oxycontin (Oxycodone Hcl)           Current Medications (02/26/2023):  This is the current hospital active medication list Current Facility-Administered Medications  Medication Dose Route Frequency Provider Last Rate Last Admin   acetaminophen (TYLENOL) tablet 1,000 mg  1,000 mg Oral Q6H PRN Rondel Baton, MD   1,000 mg at 02/25/23 2331   cloNIDine (CATAPRES) tablet 0.2 mg  0.2 mg Oral TID Rondel Baton, MD   0.2 mg at 02/26/23 0102   cyanocobalamin (VITAMIN B12) tablet 500 mcg  500 mcg Oral QHS Rondel Baton, MD   500 mcg at 02/26/23 0102   fluticasone (FLONASE) 50 MCG/ACT nasal spray 2 spray  2 spray Each Nare Daily Rondel Baton, MD       lactated ringers bolus 500 mL  500 mL Intravenous Once Laurence Spates, MD       lactose free nutrition (  Boost) liquid 237 mL  237 mL Oral BID BM Rondel Baton, MD       lidocaine (LIDODERM) 5 % 1 patch  1 patch Transdermal Q24H Rondel Baton, MD   1 patch at 02/25/23 2331   loratadine (CLARITIN) tablet 10 mg  10 mg Oral Daily Rondel Baton, MD       melatonin tablet 3 mg  3 mg Oral QHS Rondel Baton, MD   3 mg at 02/26/23 0102   metoprolol tartrate (LOPRESSOR) tablet 12.5 mg  12.5 mg Oral Daily PRN Laurence Spates, MD       omega-3 acid ethyl esters (LOVAZA) capsule 1 g  1 capsule Oral BID Rondel Baton, MD       polyvinyl alcohol (LIQUIFILM TEARS) 1.4 % ophthalmic solution 1 drop  1 drop Both Eyes BID PRN Rondel Baton, MD       vitamin E capsule 400 Units  400 Units Oral Daily Rondel Baton, MD       Warfarin - Pharmacist Dosing Inpatient   Does not apply q1600 [place]barger, Faye Ramsay [facility]       Current Outpatient Medications  Medication Sig Dispense Refill   acetaminophen (TYLENOL) 500 MG tablet Take 1,000 mg by mouth every 6 (six) hours as needed for mild pain (pain score 1-3) (Use for pain in hands, legs, and back).     CINNAMON PO Take 1,000 mg by mouth in the morning and at bedtime.     cloNIDine (CATAPRES) 0.1 MG tablet Take 1 tablet (0.1 mg total) by mouth 3 (three) times daily. Take between 0.2mg  dose. 90 tablet 6   cloNIDine (CATAPRES) 0.2 MG tablet Take 1 tablet (0.2 mg total) by mouth 3 (three) times daily. 90 tablet 6   cyanocobalamin (VITAMIN B12) 500 MCG tablet Take 500 mcg by mouth at bedtime.     fluticasone (FLONASE) 50 MCG/ACT nasal spray Place 2 sprays into both nostrils daily.     lactose free nutrition (BOOST) LIQD Take 237 mLs by mouth 2 (two) times daily between meals.     loratadine (CLARITIN) 10 MG tablet Take 10 mg by mouth daily.     metoprolol tartrate (LOPRESSOR) 25 MG tablet Take 25 mg by mouth daily as needed (Take when heart rate is over 100bpm).     Omega 3 1000 MG CAPS Take 1 capsule by mouth 2 (two) times daily.     polyvinyl alcohol (LIQUIFILM TEARS) 1.4 % ophthalmic solution Place 1 drop into both eyes 2 (two) times daily as needed for dry eyes.     vitamin E 400 UNIT capsule Take 400 Units by mouth daily.     warfarin (COUMADIN) 5 MG tablet TAKE 1 TABLET BY MOUTH ONCE DAILY OR AS DIRECTED BY THE COUMADIN CLINIC (Patient taking differently: Take 1 tablet by mouth daily.) 100 tablet 0     Discharge Medications: Please see discharge summary for a list of discharge medications.  Relevant Imaging Results:  Relevant Lab  Results:   Additional Information SSN [id number]  Susa Simmonds, LCSWA

## 2023-02-26 NOTE — ED Notes (Signed)
Pt assisted with bedpan for urinary needs.  Pt further assisted with comfort in stretcher following this and provided with something to drink per request.

## 2023-02-26 NOTE — Evaluation (Signed)
Physical Therapy Evaluation Patient Details Name: Ralph Sullivan MRN: [id number] DOB: Oct 02, 1945 Today's Date: 02/26/2023  History of Present Illness  The pt is a 77 yo male presenting 10/31 with generalized weakness in BLE. PMH includes: atrial fibrillation on metoprolol and warfarin, hyperhidrosis of the hands, and CKD.   Clinical Impression  Pt in bed upon arrival of PT, agreeable to evaluation at this time. Prior to admission the pt was living alone, ambulating with use of RW or rollator in his home, and receiving assist from friends for meals. The pt was able to complete bed mobility with minA, but needs min-modA for OOB mobility in addition to RW. He was limited to 2 bouts of short walking (3 ft, then 98ft) due to fatigue, chest pressure, and HR elevation to 167bpm. Given the pt currently needs assistance with all OOB mobility and has significant HR elevation requiring frequent seated rest, he is not yet safe to return home without consistent supervision and assist. The pt reports his friends can only check in on him intermittently, therefore I recommend continued skilled PT acutely and after d/c to progress activity tolerance, endurance, and power in LE to allow pt to return home with greatest independence and safety.      If plan is discharge home, recommend the following: A lot of help with walking and/or transfers;A lot of help with bathing/dressing/bathroom;Assistance with cooking/housework;Direct supervision/assist for medications management;Direct supervision/assist for financial management;Assist for transportation;Help with stairs or ramp for entrance   Can travel by private vehicle   No    Equipment Recommendations None recommended by PT  Recommendations for Other Services  OT consult    Functional Status Assessment Patient has had a recent decline in their functional status and demonstrates the ability to make significant improvements in function in a reasonable and predictable  amount of time.     Precautions / Restrictions Precautions Precautions: Fall Precaution Comments: watch HR Restrictions Weight Bearing Restrictions: No      Mobility  Bed Mobility Overal bed mobility: Needs Assistance Bed Mobility: Supine to Sit, Sit to Supine     Supine to sit: Min assist Sit to supine: Min assist   General bed mobility comments: minA to complete movement at trunk and position in bed    Transfers Overall transfer level: Needs assistance Equipment used: Rolling walker (2 wheels) Transfers: Sit to/from Stand Sit to Stand: Min assist, +2 physical assistance, From elevated surface           General transfer comment: minA to rise from ED stretcher, completed x4 in session with HR to 130-145bpm with sit-stand    Ambulation/Gait Ambulation/Gait assistance: Min assist, Mod assist, +2 physical assistance Gait Distance (Feet): 3 Feet (+ 4 ft) Assistive device: Rolling walker (2 wheels) Gait Pattern/deviations: Step-to pattern, Decreased stride length, Shuffle, Trunk flexed Gait velocity: decreased Gait velocity interpretation: <1.31 ft/sec, indicative of household ambulator   General Gait Details: pt with small shufflign steps, min-modA of 2 to steady. HR to 167bpm with activity. pt completed x2 with seated rest. reports chest pressure with activity     Balance Overall balance assessment: Needs assistance Sitting-balance support: No upper extremity supported, Feet supported Sitting balance-Leahy Scale: Poor Sitting balance - Comments: posterior LOB with LE movement sitting EOB   Standing balance support: Bilateral upper extremity supported, During functional activity Standing balance-Leahy Scale: Poor Standing balance comment: BUE support and assist of 2  Pertinent Vitals/Pain Pain Assessment Pain Assessment: No/denies pain    Home Living Family/patient expects to be discharged to:: Private residence Living  Arrangements: Alone Available Help at Discharge: Friend(s);Available PRN/intermittently Type of Home: House           Home Equipment: Agricultural consultant (2 wheels);Rollator (4 wheels);BSC/3in1;Wheelchair - manual Additional Comments: pt reports friends check in on him and assist with meals    Prior Function Prior Level of Function : Needs assist             Mobility Comments: pt uses RW or rollator in the home, uses cane and sink counter in bathroom. sponge bathes. ADLs Comments: pt sponge bathes at baseline, assist from friends for IADLs and meals     Extremity/Trunk Assessment   Upper Extremity Assessment Upper Extremity Assessment: Generalized weakness;RUE deficits/detail RUE Deficits / Details: pt unable to flex at R shoulder, limited abduction to <45 deg. functional grip strength RUE: Shoulder pain with ROM    Lower Extremity Assessment Lower Extremity Assessment: Generalized weakness (no focal deficits but limited power and endurance)    Cervical / Trunk Assessment Cervical / Trunk Assessment: Kyphotic  Communication   Communication Communication: No apparent difficulties Cueing Techniques: Verbal cues  Cognition Arousal: Alert Behavior During Therapy: WFL for tasks assessed/performed Overall Cognitive Status: No family/caregiver present to determine baseline cognitive functioning                                          General Comments General comments (skin integrity, edema, etc.): HR to 167bpm with 4 ft ambulation, pt reports chest pressure, RN alerted        Assessment/Plan    PT Assessment Patient needs continued PT services  PT Problem List Decreased strength;Decreased range of motion;Decreased activity tolerance;Decreased balance;Decreased mobility;Decreased knowledge of use of DME;Decreased safety awareness       PT Treatment Interventions Gait training;DME instruction;Stair training;Functional mobility training;Therapeutic  activities;Therapeutic exercise;Balance training;Patient/family education    PT Goals (Current goals can be found in the Care Plan section)  Acute Rehab PT Goals Patient Stated Goal: improve endurance and return home PT Goal Formulation: With patient Time For Goal Achievement: 03/12/23 Potential to Achieve Goals: Good    Frequency Min 1X/week        AM-PAC PT "6 Clicks" Mobility  Outcome Measure Help needed turning from your back to your side while in a flat bed without using bedrails?: A Little Help needed moving from lying on your back to sitting on the side of a flat bed without using bedrails?: A Little Help needed moving to and from a bed to a chair (including a wheelchair)?: A Lot Help needed standing up from a chair using your arms (e.g., wheelchair or bedside chair)?: A Lot Help needed to walk in hospital room?: Total (<20 ft) Help needed climbing 3-5 steps with a railing? : Total 6 Click Score: 12    End of Session Equipment Utilized During Treatment: Gait belt Activity Tolerance: Patient limited by fatigue Patient left: in bed;with call bell/phone within reach Nurse Communication: Mobility status;Other (comment) (HR) PT Visit Diagnosis: Unsteadiness on feet (R26.81);Other abnormalities of gait and mobility (R26.89);Muscle weakness (generalized) (M62.81)    Time: 0940-1005 PT Time Calculation (min) (ACUTE ONLY): 25 min   Charges:   PT Evaluation $PT Eval Low Complexity: 1 Low PT Treatments $Therapeutic Activity: 8-22 mins PT General Charges $$ ACUTE PT  VISIT: 1 Visit         Vickki Muff, PT, DPT   Acute Rehabilitation Department Office [phone] Secure Chat Communication Preferred  Ronnie Derby 02/26/2023, 10:21 AM

## 2023-02-26 NOTE — Evaluation (Signed)
Occupational Therapy Evaluation Patient Details Name: Ralph Sullivan MRN: [id number] DOB: [birth year] Today's Date: 02/26/2023   History of Present Illness The pt is a 77 yo male presenting 10/31 with generalized weakness in BLE. PMH includes: atrial fibrillation on metoprolol and warfarin, hyperhidrosis of the hands, and CKD.   Clinical Impression   Patient admitted for the diagnosis above.  PTA he lives alone, does have friend support for community mobility and groceries, but otherwise is Mod I at home with mobility and ADL completion.  Does participate with light iADL.  Lower extremity weakness and elevated HR are the deficits, he is currently needing up to Max A for lower body ADL and Mod A for basic transfers.  OT will continue efforts in the acute setting to address deficits, but Patient will benefit from continued inpatient follow up therapy, <3 hours/day, as he does not have the needed 24 hour assist to transition directly home.           If plan is discharge home, recommend the following: Assist for transportation;Assistance with cooking/housework;A lot of help with bathing/dressing/bathroom;A lot of help with walking and/or transfers    Functional Status Assessment  Patient has had a recent decline in their functional status and demonstrates the ability to make significant improvements in function in a reasonable and predictable amount of time.  Equipment Recommendations  Other (comment) (To be determined)    Recommendations for Other Services       Precautions / Restrictions Precautions Precautions: Fall Precaution Comments: watch HR Restrictions Weight Bearing Restrictions: No      Mobility Bed Mobility Overal bed mobility: Needs Assistance Bed Mobility: Supine to Sit, Sit to Supine     Supine to sit: Min assist Sit to supine: Min assist        Transfers Overall transfer level: Needs assistance Equipment used: Rolling walker (2 wheels) Transfers: Sit to/from  Stand Sit to Stand: Mod assist           General transfer comment: LE's very weak and ataxic      Balance Overall balance assessment: Needs assistance Sitting-balance support: No upper extremity supported, Feet supported Sitting balance-Leahy Scale: Fair     Standing balance support: Bilateral upper extremity supported, During functional activity Standing balance-Leahy Scale: Poor                             ADL either performed or assessed with clinical judgement   ADL Overall ADL's : Needs assistance/impaired Eating/Feeding: Independent;Sitting   Grooming: Wash/dry hands;Wash/dry face;Set up;Bed level   Upper Body Bathing: Minimal assistance;Bed level   Lower Body Bathing: Moderate assistance;Bed level   Upper Body Dressing : Minimal assistance;Bed level   Lower Body Dressing: Moderate assistance;Bed level   Toilet Transfer: Maximal assistance;Stand-pivot;BSC/3in1   Toileting- Clothing Manipulation and Hygiene: Total assistance;Sit to/from stand               Vision Baseline Vision/History: 1 Wears glasses Patient Visual Report: No change from baseline       Perception Perception: Not tested       Praxis Praxis: Not tested       Pertinent Vitals/Pain Pain Assessment Pain Assessment: No/denies pain     Extremity/Trunk Assessment Upper Extremity Assessment RUE Deficits / Details: 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   Lower Extremity Assessment Lower Extremity Assessment: Defer to PT evaluation   Cervical / Trunk Assessment Cervical / Trunk Assessment: Kyphotic  Communication Communication Communication: No apparent difficulties   Cognition Arousal: Alert Behavior During Therapy: WFL for tasks assessed/performed Overall Cognitive Status: Within Functional Limits for tasks assessed                                       General Comments   Hr to 114 with EOB standing with side steps    Exercises      Shoulder Instructions      Home Living Family/patient expects to be discharged to:: Private residence Living Arrangements: Alone Available Help at Discharge: Friend(s);Available PRN/intermittently Type of Home: House Home Access: Ramped entrance     Home Layout: One level     Bathroom Shower/Tub: Sponge bathes at baseline   Bathroom Toilet: Standard Bathroom Accessibility: Yes How Accessible: Accessible via walker Home Equipment: Rolling Walker (2 wheels);Rollator (4 wheels);BSC/3in1;Wheelchair - manual   Additional Comments: pt reports friends check in on him and assist with meals      Prior Functioning/Environment Prior Level of Function : Needs assist             Mobility Comments: pt uses RW or rollator in the home, uses cane and sink counter in bathroom. ADLs Comments: pt sponge bathes at baseline, assist from friends for IADLs and meals/groceries.  Uses microwave for dinners.        OT Problem List: Decreased strength;Decreased range of motion;Decreased activity tolerance;Impaired balance (sitting and/or standing);Impaired sensation      OT Treatment/Interventions: Self-care/ADL training;Therapeutic activities;Patient/family education;Balance training;DME and/or AE instruction    OT Goals(Current goals can be found in the care plan section) Acute Rehab OT Goals Patient Stated Goal: Get stronger and go home OT Goal Formulation: With patient Time For Goal Achievement: 03/12/23 Potential to Achieve Goals: Good ADL Goals Pt Will Perform Grooming: with supervision;standing Pt Will Perform Upper Body Dressing: with set-up;sitting;standing Pt Will Perform Lower Body Dressing: with min assist;sit to/from stand;sitting/lateral leans Pt Will Transfer to Toilet: with supervision;stand pivot transfer;bedside commode Pt/caregiver will Perform Home Exercise Program: Increased strength;Both right and left upper extremity;With theraband  OT Frequency: Min 1X/week     Co-evaluation              AM-PAC OT "6 Clicks" Daily Activity     Outcome Measure Help from another person eating meals?: None Help from another person taking care of personal grooming?: None Help from another person toileting, which includes using toliet, bedpan, or urinal?: A Lot Help from another person bathing (including washing, rinsing, drying)?: A Lot Help from another person to put on and taking off regular upper body clothing?: A Little Help from another person to put on and taking off regular lower body clothing?: A Lot 6 Click Score: 17   End of Session Equipment Utilized During Treatment: Gait belt;Rolling walker (2 wheels) Nurse Communication: Mobility status  Activity Tolerance: Patient tolerated treatment well Patient left: in bed;with call bell/phone within reach  OT Visit Diagnosis: Unsteadiness on feet (R26.81);Muscle weakness (generalized) (M62.81);History of falling (Z91.81)                Time: 6301-6010 OT Time Calculation (min): 18 min Charges:  OT General Charges $OT Visit: 1 Visit OT Evaluation $OT Eval Moderate Complexity: 1 Mod  02/26/2023  RP, OTR/L  Acute Rehabilitation Services  Office:  [phone]   Suzanna Obey 02/26/2023, 4:00 PM

## 2023-02-26 NOTE — H&P (Signed)
History and Physical    Patient: Ralph Sullivan FTD:[id number] DOB: [birth year] DOA: 02/25/2023 DOS: the patient was seen and examined on 02/26/2023 PCP: Lorenda Ishihara, MD  Patient coming from: Home  Chief Complaint:  Chief Complaint  Patient presents with   Weakness   HPI: Ralph Sullivan is a 77 y.o. male with medical history significant of A-fib on warfarin hyperhidrosis of the hands, CKD 3 AA and CVA who presented to the ED for evaluation of generalized weakness. Patient tells me he got up to make some food but just could not stay on his feet so he called his neighbor. He endorsed a chronic hyperhidrosis of the hands and some heaviness in his chest that has now resolved.  He was previously on metoprolol for his A-fib but it was titrated off by his cardiologist due to low heart rate.  Earlier this year, he was told to resume taking his metoprolol 25 mg as needed for heart rate over 100.  He denies any chest pain, dizziness, vision changes shortness of breath or palpitations.  ED course: Hemodynamically stable with initial heart rate in the 80s. PT/INR 26.3/2.4. Normal troponins. Labs were overall unremarkable. Initial EKG showed stable A-fib with occasional PVCs. Patient was placed on boarding pending evaluation by PT to determine placement.  Today, patient found to have elevated heart rate to the 130s to 160s during PT session. Repeat EKG showed A-fib with RVR with heart rate in the 130s. Hospitalist consulted to admit to further management of his A-fib.  Review of Systems: As mentioned in the history of present illness. All other systems reviewed and are negative. Past Medical History:  Diagnosis Date   Atrial fibrillation (HCC) 02/08/2013   CHADS-VAS - 2 (AGE, HTN). On anticoagulation. Holter 6/14 - 2.3 sec pause. Avg HR 87. 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 His smoking use included cigarettes. He has never used smokeless tobacco. He reports that he does not drink alcohol and does not use drugs.  Lives alone but gets help from neighbors and friends. Independent with most ADLs such as cooking, dressing and bathing but does not drive.  He mostly ambulates with a walker but also has a cane.  Allergies  Allergen Reactions   Allopurinol Other (See Comments)   Colchicine Other (See Comments)   Duloxetine Other (See Comments)    More nervous   Elemental Sulfur Nausea And Vomiting   Oxycontin [Oxycodone Hcl] Nausea And Vomiting  Family History  Problem Relation Age of Onset   Hypertension Mother    Diabetes Mother     Prior to Admission medications   Medication Sig Start Date End Date Taking? Authorizing Provider  acetaminophen (TYLENOL) 500 MG tablet Take 1,000 mg by mouth every 6 (six) hours as needed for mild pain (pain score 1-3) (Use for pain in hands, legs, and back).   Yes [provider]  CINNAMON PO Take 1,000 mg by mouth in the morning and at bedtime.   Yes [provider]  cloNIDine  (CATAPRES) 0.1 MG tablet Take 1 tablet (0.1 mg total) by mouth 3 (three) times daily. Take between 0.2mg  dose. 10/20/22  Yes Jake Bathe, MD  cloNIDine (CATAPRES) 0.2 MG tablet Take 1 tablet (0.2 mg total) by mouth 3 (three) times daily. 10/20/22  Yes Jake Bathe, MD  cyanocobalamin (VITAMIN B12) 500 MCG tablet Take 500 mcg by mouth at bedtime.   Yes [provider]  fluticasone (FLONASE) 50 MCG/ACT nasal spray Place 2 sprays into both nostrils daily.   Yes [provider]  lactose free nutrition (BOOST) LIQD Take 237 mLs by mouth 2 (two) times daily between meals.   Yes [provider]  loratadine (CLARITIN) 10 MG tablet Take 10 mg by mouth daily.   Yes [provider]  metoprolol tartrate (LOPRESSOR) 25 MG tablet Take 25 mg by mouth daily as needed (Take when heart rate is over 100bpm). 09/22/22  Yes [provider]  Omega 3 1000 MG CAPS Take 1 capsule by mouth 2 (two) times daily.   Yes [provider]  polyvinyl alcohol (LIQUIFILM TEARS) 1.4 % ophthalmic solution Place 1 drop into both eyes 2 (two) times daily as needed for dry eyes.   Yes [provider]  vitamin E 400 UNIT capsule Take 400 Units by mouth daily.   Yes [provider]  warfarin (COUMADIN) 5 MG tablet TAKE 1 TABLET BY MOUTH ONCE DAILY OR AS DIRECTED BY THE COUMADIN CLINIC Patient taking differently: Take 1 tablet by mouth daily. 01/15/23  Yes Jake Bathe, MD    Physical Exam: Vitals:   02/26/23 1000 02/26/23 1100 02/26/23 1200 02/26/23 1300  BP: (!) 122/94 (!) 117/90 120/85 (!) 152/100  Pulse: 98 65 70 91  Resp: (!) 26 20 14 18   Temp:      TempSrc:      SpO2: (!) 84% (!) 82% 100% 100%  Weight:      Height:      General: Pleasant, well-appearing elderly Ralph laying in bed. No acute distress. Head: Normocephalic. Atraumatic. CV: Tachycardia. Irregulary irregular rhythm. No murmurs, rubs, or gallops. No LE edema Pulmonary: Lungs CTAB. Normal  effort. No wheezing or rales. Abdominal: Soft, nontender, nondistended. Normal bowel sounds. Extremities: Faint DP pulses.. Normal ROM. Skin: Warm and dry. No obvious rash or lesions. Neuro: A&Ox3. Moves all extremities. Normal sensation. No focal deficit. Psych: Normal mood and affect  Data Reviewed:  Repeat EKG today shows A-fib with RVR with HR in the 130s  Assessment and Plan: Ralph Sullivan is a 77 y.o. male with medical history significant of A-fib on warfarin hyperhidrosis of the hands, CKD 3A and CVA who presented to the ED for evaluation of generalized weakness and admitted for further management of A-fib.   # A-fib on warfarin Patient with history of permanent A-fib on warfarin initially rate controlled but now with elevated HR to the 140s-160s with exertion.  Patient's heart rate in  the 70s to 90s at rest during my evaluation.  Tachycardia with activity has been an ongoing problem for patient.  He has been off his metoprolol and taking it about twice weekly for elevated heart rates. Will start low-dose metoprolol twice daily and titrate based on heart rate. -Admit to telemetry bed -Change metoprolol to 12.5 mg twice daily, hold if HR <60 -Warfarin management per pharmacy -Check TSH, trend and replete electrolytes  # Generalized weakness # Deconditioning Patient reported generalized weakness and difficulty with ambulation over the last few months.  Patient reports feeling weak in his legs. Patient unable to complete PT sessions due to fatigue and elevated heart rate to the 160s. -Continue acute PT/OT -Protein supplementation -Fall precautions  # HTN # Chronic systolic heart failure Last TTE 2021 with a EF 45%. SBP in the 120s to 150s. Patient not on ACE inhibitor due to kidney disease. -Clonidine 0.2 mg 3 times daily -Start metoprolol 12.5 mg twice daily  # CKD 3A Creatinine of 1.48 around baseline of 1.4-1.5. Follows with [facility]. -Avoid nephrotoxic  agents -Trend electrolytes and kidney function   Advance Care Planning:   Code Status: Full Code   Consults: None  Family Communication: No family at bedside  Severity of Illness: The appropriate patient status for this patient is INPATIENT. Inpatient status is judged to be reasonable and necessary in order to provide the required intensity of service to ensure the patient's safety. The patient's presenting symptoms, physical exam findings, and initial radiographic and laboratory data in the context of their chronic comorbidities is felt to place them at high risk for further clinical deterioration. Furthermore, it is not anticipated that the patient will be medically stable for discharge from the hospital within 2 midnights of admission.   * I certify that at the point of admission it is my clinical judgment that the patient will require inpatient hospital care spanning beyond 2 midnights from the point of admission due to high intensity of service, high risk for further deterioration and high frequency of surveillance required.*  Author: Steffanie Rainwater, MD 02/26/2023 3:45 PM  For on call review [email or web address].

## 2023-02-26 NOTE — Progress Notes (Signed)
PHARMACY - ANTICOAGULATION CONSULT NOTE  Pharmacy Consult for warfarin Indication: atrial fibrillation  Allergies  Allergen Reactions   Allopurinol Other (See Comments)   Colchicine Other (See Comments)   Duloxetine Other (See Comments)    More nervous   Elemental Sulfur Nausea And Vomiting   Oxycontin [Oxycodone Hcl] Nausea And Vomiting    Patient Measurements: Height: 6' (182.9 cm) Weight: 70.4 kg (155 lb 3.3 oz) IBW/kg (Calculated) : 77.6  Vital Signs: BP: 106/76 (11/01 0600) Pulse Rate: 84 (11/01 0600)  Labs: Recent Labs    02/25/23 1250 02/25/23 1514 02/25/23 1839  HGB 12.1*  --   --   HCT 36.2*  --   --   PLT 181  --   --   LABPROT  --   --  26.3*  INR  --   --  2.4*  CREATININE 1.48*  --   --   TROPONINIHS  --  15 13    Estimated Creatinine Clearance: 42.9 mL/min (A) (by C-G formula based on SCr of 1.48 mg/dL (H)).   Medical History: Past Medical History:  Diagnosis Date   Atrial fibrillation (HCC) 02/08/2013   CHADS-VAS - 2 (AGE, HTN). On anticoagulation. Holter 6/14 - 2.3 sec pause. Avg HR 87. 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 He is on chronic warfarin for history of afib. INR is therapeutic as of last night at 2.4. He received a dose of warfarin early this morning. No bleeding noted.   Goal of Therapy:  INR 2-3 Monitor platelets by anticoagulation protocol: Yes   Plan:  No further warfarin today Daily INR  Alazne Quant, Drake Leach 02/26/2023,9:54 AM

## 2023-02-26 NOTE — ED Provider Notes (Signed)
Emergency Medicine Observation Re-evaluation Note  Ralph Sullivan is a 77 y.o. male, seen on rounds today.  Pt initially presented to the ED for complaints of Weakness Currently, the patient is resting comfortably in bed.  Patient presented yesterday, has complex medical history.  He complains of generalized weakness, difficulty with ambulation.  Lives at home.  Had medical workup yesterday and was cleared, placed on boarding status for physical therapy evaluation and possible placement.  No acute concerns  Physical Exam  BP 106/76   Pulse 84   Temp 97.6 F (36.4 C) (Oral)   Resp 13   Ht 6' (1.829 m)   Wt 70.4 kg   SpO2 100%   BMI 21.05 kg/m  Physical Exam General: Calm, resting comfortably in bed Lungs: Normal resp rate effort Psych: Calm, cooperative  ED Course / MDM  EKG:EKG Interpretation Date/Time:  Thursday February 25 2023 15:12:04 EDT Ventricular Rate:  88 PR Interval:    QRS Duration:  156 QT Interval:  392 QTC Calculation: 474 R Axis:   -87  Text Interpretation: Atrial fibrillation with premature ventricular or aberrantly conducted complexes Right bundle branch block Left anterior fascicular block Bifascicular block Septal infarct , age undetermined Abnormal ECG Confirmed by Vonita Moss [id number]) on 02/25/2023 4:31:36 PM  I have reviewed the labs performed to date as well as medications administered while in observation.  Recent changes in the last 24 hours include home meds were ordered.  Plan  Current plan is for PT and TOC evaluation.Earlene Plater, Elenor Quinones, MD 02/26/23 [phone]

## 2023-02-27 ENCOUNTER — Observation Stay (HOSPITAL_COMMUNITY): Payer: Medicare HMO

## 2023-02-27 DIAGNOSIS — Z23 Encounter for immunization: Secondary | ICD-10-CM | POA: Diagnosis not present

## 2023-02-27 DIAGNOSIS — K56600 Partial intestinal obstruction, unspecified as to cause: Secondary | ICD-10-CM | POA: Diagnosis not present

## 2023-02-27 DIAGNOSIS — I4821 Permanent atrial fibrillation: Secondary | ICD-10-CM | POA: Diagnosis not present

## 2023-02-27 DIAGNOSIS — R109 Unspecified abdominal pain: Secondary | ICD-10-CM | POA: Diagnosis not present

## 2023-02-27 DIAGNOSIS — I4811 Longstanding persistent atrial fibrillation: Secondary | ICD-10-CM | POA: Diagnosis not present

## 2023-02-27 DIAGNOSIS — I130 Hypertensive heart and chronic kidney disease with heart failure and stage 1 through stage 4 chronic kidney disease, or unspecified chronic kidney disease: Secondary | ICD-10-CM | POA: Diagnosis not present

## 2023-02-27 DIAGNOSIS — N200 Calculus of kidney: Secondary | ICD-10-CM | POA: Diagnosis not present

## 2023-02-27 DIAGNOSIS — N136 Pyonephrosis: Secondary | ICD-10-CM | POA: Diagnosis not present

## 2023-02-27 DIAGNOSIS — K4090 Unilateral inguinal hernia, without obstruction or gangrene, not specified as recurrent: Secondary | ICD-10-CM | POA: Diagnosis not present

## 2023-02-27 DIAGNOSIS — I420 Dilated cardiomyopathy: Secondary | ICD-10-CM | POA: Diagnosis not present

## 2023-02-27 DIAGNOSIS — K56609 Unspecified intestinal obstruction, unspecified as to partial versus complete obstruction: Secondary | ICD-10-CM | POA: Diagnosis not present

## 2023-02-27 DIAGNOSIS — K5730 Diverticulosis of large intestine without perforation or abscess without bleeding: Secondary | ICD-10-CM | POA: Diagnosis not present

## 2023-02-27 DIAGNOSIS — R634 Abnormal weight loss: Secondary | ICD-10-CM | POA: Diagnosis not present

## 2023-02-27 DIAGNOSIS — N1831 Chronic kidney disease, stage 3a: Secondary | ICD-10-CM | POA: Diagnosis not present

## 2023-02-27 DIAGNOSIS — Z1152 Encounter for screening for COVID-19: Secondary | ICD-10-CM | POA: Diagnosis not present

## 2023-02-27 DIAGNOSIS — N281 Cyst of kidney, acquired: Secondary | ICD-10-CM | POA: Diagnosis not present

## 2023-02-27 DIAGNOSIS — I5022 Chronic systolic (congestive) heart failure: Secondary | ICD-10-CM | POA: Diagnosis not present

## 2023-02-27 DIAGNOSIS — R140 Abdominal distension (gaseous): Secondary | ICD-10-CM | POA: Diagnosis not present

## 2023-02-27 DIAGNOSIS — R001 Bradycardia, unspecified: Secondary | ICD-10-CM | POA: Diagnosis not present

## 2023-02-27 DIAGNOSIS — R7881 Bacteremia: Secondary | ICD-10-CM | POA: Diagnosis not present

## 2023-02-27 LAB — TSH: TSH: 1.253 u[IU]/mL (ref 0.350–4.500)

## 2023-02-27 LAB — CBC
HCT: 34.9 % — ABNORMAL LOW (ref 39.0–52.0)
Hemoglobin: 11.7 g/dL — ABNORMAL LOW (ref 13.0–17.0)
MCH: 32.9 pg (ref 26.0–34.0)
MCHC: 33.5 g/dL (ref 30.0–36.0)
MCV: 98 fL (ref 80.0–100.0)
Platelets: 158 10*3/uL (ref 150–400)
RBC: 3.56 MIL/uL — ABNORMAL LOW (ref 4.22–5.81)
RDW: 13.9 % (ref 11.5–15.5)
WBC: 6.1 10*3/uL (ref 4.0–10.5)
nRBC: 0 % (ref 0.0–0.2)

## 2023-02-27 LAB — PROTIME-INR
INR: 2.5 — ABNORMAL HIGH (ref 0.8–1.2)
Prothrombin Time: 27.3 s — ABNORMAL HIGH (ref 11.4–15.2)

## 2023-02-27 LAB — RENAL FUNCTION PANEL
Albumin: 3.2 g/dL — ABNORMAL LOW (ref 3.5–5.0)
Anion gap: 11 (ref 5–15)
BUN: 22 mg/dL (ref 8–23)
CO2: 23 mmol/L (ref 22–32)
Calcium: 9.4 mg/dL (ref 8.9–10.3)
Chloride: 96 mmol/L — ABNORMAL LOW (ref 98–111)
Creatinine, Ser: 1.25 mg/dL — ABNORMAL HIGH (ref 0.61–1.24)
GFR, Estimated: >60 mL/min (ref >60)
Glucose, Bld: 101 mg/dL — ABNORMAL HIGH (ref 70–99)
Phosphorus: 3.6 mg/dL (ref 2.5–4.6)
Potassium: 4.2 mmol/L (ref 3.5–5.1)
Sodium: 130 mmol/L — ABNORMAL LOW (ref 135–145)

## 2023-02-27 LAB — VITAMIN D 25 HYDROXY (VIT D DEFICIENCY, FRACTURES): Vit D, 25-Hydroxy: 62.27 ng/mL (ref 30–100)

## 2023-02-27 LAB — MAGNESIUM: Magnesium: 1.7 mg/dL (ref 1.7–2.4)

## 2023-02-27 MED ORDER — HYDRALAZINE HCL 20 MG/ML IJ SOLN: 10.0000 mg | INTRAMUSCULAR | Status: DC | PRN

## 2023-02-27 MED ORDER — ALUM & MAG HYDROXIDE-SIMETH 200-200-20 MG/5ML PO SUSP
30.0000 mL | ORAL | Status: DC | PRN
  Administered 2023-02-27 – 2023-03-03 (×4): 30 mL via ORAL
  Filled 2023-02-27 (×4): qty 30

## 2023-02-27 MED ORDER — ACETAMINOPHEN 650 MG RE SUPP: 325.0000 mg | RECTAL | Status: DC | PRN

## 2023-02-27 MED ORDER — INFLUENZA VAC A&B SURF ANT ADJ 0.5 ML IM SUSY
0.5000 mL | PREFILLED_SYRINGE | INTRAMUSCULAR | Status: AC
  Administered 2023-02-28: 0.5 mL via INTRAMUSCULAR
  Filled 2023-02-27: qty 0.5

## 2023-02-27 MED ORDER — HYDROMORPHONE HCL 1 MG/ML IJ SOLN
0.5000 mg | INTRAMUSCULAR | Status: DC | PRN
  Administered 2023-02-28 – 2023-03-08 (×23): 0.5 mg via INTRAVENOUS
  Filled 2023-02-27 (×23): qty 1

## 2023-02-27 MED ORDER — WARFARIN SODIUM 5 MG PO TABS
5.0000 mg | ORAL_TABLET | Freq: Once | ORAL | Status: AC
  Administered 2023-02-27: 5 mg via ORAL
  Filled 2023-02-27: qty 1

## 2023-02-27 MED ORDER — MAGNESIUM OXIDE -MG SUPPLEMENT 400 (240 MG) MG PO TABS
800.0000 mg | ORAL_TABLET | Freq: Once | ORAL | Status: AC
  Administered 2023-02-27: 800 mg via ORAL
  Filled 2023-02-27: qty 2

## 2023-02-27 MED ORDER — METOPROLOL TARTRATE 5 MG/5ML IV SOLN: 5.0000 mg | INTRAVENOUS | Status: DC | PRN

## 2023-02-27 MED ORDER — IPRATROPIUM-ALBUTEROL 0.5-2.5 (3) MG/3ML IN SOLN: 3.0000 mL | RESPIRATORY_TRACT | Status: DC | PRN

## 2023-02-27 MED ORDER — TRAZODONE HCL 50 MG PO TABS: 50.0000 mg | ORAL_TABLET | Freq: Every evening | ORAL | Status: DC | PRN

## 2023-02-27 MED ORDER — PANTOPRAZOLE SODIUM 40 MG PO TBEC
40.0000 mg | DELAYED_RELEASE_TABLET | Freq: Every day | ORAL | Status: DC
  Administered 2023-02-27: 40 mg via ORAL
  Filled 2023-02-27: qty 1

## 2023-02-27 MED ORDER — PANTOPRAZOLE SODIUM 40 MG IV SOLR
40.0000 mg | INTRAVENOUS | Status: DC
  Administered 2023-02-28 – 2023-03-08 (×9): 40 mg via INTRAVENOUS
  Filled 2023-02-27 (×8): qty 10

## 2023-02-27 NOTE — Care Management CC44 (Signed)
Condition Code 44 Documentation Completed  Patient Details  Name: BROWNIE NEHME MRN: [id number] Date of Birth: May 27, 1945   Condition Code 44 given:  Yes Patient signature on Condition Code 44 notice:  Yes Documentation of 2 MD's agreement:  Yes Code 44 added to claim:  Yes    Ronny Bacon, RN 02/27/2023, 10:33 AM

## 2023-02-27 NOTE — Progress Notes (Signed)
PHARMACY - ANTICOAGULATION CONSULT NOTE  Pharmacy Consult for warfarin Indication: atrial fibrillation  Allergies  Allergen Reactions   Allopurinol Other (See Comments)   Colchicine Other (See Comments)   Duloxetine Other (See Comments)    More nervous   Elemental Sulfur Nausea And Vomiting   Oxycontin [Oxycodone Hcl] Nausea And Vomiting    Patient Measurements: Height: 6' (182.9 cm) Weight: 70.4 kg (155 lb 3.3 oz) IBW/kg (Calculated) : 77.6  Vital Signs: Temp: 97.9 F (36.6 C) (11/02 0740) Temp Source: Oral (11/02 0740) BP: 129/86 (11/02 0740) Pulse Rate: 111 (11/02 0740)  Labs: Recent Labs    02/25/23 1250 02/25/23 1514 02/25/23 1839 02/27/23 0353  HGB 12.1*  --   --  11.7*  HCT 36.2*  --   --  34.9*  PLT 181  --   --  158  LABPROT  --   --  26.3* 27.3*  INR  --   --  2.4* 2.5*  CREATININE 1.48*  --   --  1.25*  TROPONINIHS  --  15 13  --     Estimated Creatinine Clearance: 50.8 mL/min (A) (by C-G formula based on SCr of 1.25 mg/dL (H)).   Medical History: Past Medical History:  Diagnosis Date   Atrial fibrillation (HCC) 02/08/2013   CHADS-VAS - 2 (AGE, HTN). On anticoagulation. Holter 6/14 - 2.3 sec pause. Avg HR 87. 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 He is on chronic warfarin for history of afib. PTA dose per [facility] clinic notes is 7.5 mg Tuesday and 5 mg AOD. However, per med history patient reports taking 5 mg daily.   INR therapeutic today at 2.5. No new bleeding noted  per RN.   Goal of Therapy:  INR 2-3 Monitor platelets by anticoagulation protocol: Yes   Plan:  Warfarin 5 mg today Daily INR  Lennie Muckle, PharmD PGY1 Pharmacy Resident 02/27/2023 9:09 AM

## 2023-02-27 NOTE — Care Management Obs Status (Signed)
MEDICARE OBSERVATION STATUS NOTIFICATION   Patient Details  Name: Ralph Sullivan MRN: [id number] Date of Birth: [birth year]   Medicare Observation Status Notification Given:  Yes    Ronny Bacon, RN 02/27/2023, 10:33 AM

## 2023-02-27 NOTE — Progress Notes (Signed)
[id number]- Paged Dr. Joneen Roach regarding patient complaint of chest pain. Upon RN assessment, patient states his chest hurts like this when he gets cold. He also is complaining of bloating/gas pains. An EKG was completed, a tshirt was placed on patient per his request and a warm towel was also given to patient. Orders for maalox were placed under gen admin prn orders. Dr. Joneen Roach aware.

## 2023-02-27 NOTE — Progress Notes (Incomplete)
CT abdomen pelvis Small bowel obstruction with transition point in the distal ileum. An underlying small bowel lesion is possible.   Surgery consult placed.  NPO.  Possible p.o. medications changed to IV.  IV fluid hydration.  Currently no need for NG tube as patient without significant nausea and vomiting.  Zofran as needed ordered.  Coumadin converted to heparin drip

## 2023-02-27 NOTE — Progress Notes (Signed)
PROGRESS NOTE    Ralph Sullivan  AVW:[id number] DOB: 01/14/1946 DOA: 02/25/2023 PCP: Lorenda Ishihara, MD   Brief Narrative:  77 year old with history of A-fib on Coumadin, hyperhidrosis, CKD stage IIIa, CVA comes to the ED for generalized weakness.  Patient was brought to the hospital evaluated by therapy and would need placement but hospital course was complicated by A-fib with RVR therefore admitted.   Assessment & Plan:  Principal Problem:   A-fib (HCC) Active Problems:   Weakness    Atrial fibrillation with RVR with history of chronic atrial fibrillation on Coumadin -Initially while working with therapy patient went into atrial fibrillation with RVR, heart rate has improved now.  Continue home metoprolol, uptitrate as necessary.  Continue Coumadin as well managed by pharmacy.  Monitor and replete electrolytes as needed.  TSH is normal.  His last echocardiogram was in January 2024 which showed EF of 45%, mild LVH, enlarged RV and dilated cardiomyopathy.   Seen by outpatient cardiology Dr. Anne Fu in September 2024.  Beta-blocker was reduced due to chronic hyperhidrosis   Generalized weakness and deconditioning Attributed to his underlying cardiac condition and age related.  PT/OT.   Essential hypertension Chronic congestive heart failure with reduced EF, 45% Patient is on clonidine 3 times daily at home.  Metoprolol twice daily has been started.   CKD stage IIIa Cr around baseline 1.4  PT/OT pending.   DVT prophylaxis: Coumadin Code Status: Full  Family Communication:   Status is: Inpatient Remains inpatient appropriate because: PT evaluation and A-fib management.    Subjective:  Doing ok no new complaints.  Generally feels weak.   Examination:  General exam: Appears calm and comfortable  Respiratory system: Clear to auscultation. Respiratory effort normal. Cardiovascular system: S1 & S2 heard, RRR. No JVD, murmurs, rubs, gallops or clicks. No pedal  edema. Gastrointestinal system: Abdomen is nondistended, soft and nontender. No organomegaly or masses felt. Normal bowel sounds heard. Central nervous system: Alert and oriented. No focal neurological deficits. Extremities: Symmetric 5 x 5 power. Skin: No rashes, lesions or ulcers Psychiatry: Judgement and insight appear normal. Mood & affect appropriate.      Diet Orders (From admission, onward)     Start     Ordered   02/26/23 1526  Diet Heart Room service appropriate? Yes; Fluid consistency: Thin  Diet effective now       Question Answer Comment  Room service appropriate? Yes   Fluid consistency: Thin      02/26/23 1526            Objective: Vitals:   02/26/23 2152 02/26/23 2321 02/27/23 0321 02/27/23 0740  BP: 95/67 95/69 102/76 129/86  Pulse: (!) 54 (!) 54 65 (!) 111  Resp: 18 16 14 20   Temp:  97.9 F (36.6 C) 97.7 F (36.5 C) 97.9 F (36.6 C)  TempSrc:  Oral Oral Oral  SpO2: 100% 99% 100% 98%  Weight:      Height:        Intake/Output Summary (Last 24 hours) at 02/27/2023 0830 Last data filed at 02/26/2023 2200 Gross per 24 hour  Intake 683.33 ml  Output 200 ml  Net 483.33 ml   Filed Weights   02/25/23 1235  Weight: 70.4 kg    Scheduled Meds:  cloNIDine  0.2 mg Oral TID   fluticasone  2 spray Each Nare Daily   lactose free nutrition  237 mL Oral BID BM   lidocaine  1 patch Transdermal Q24H   loratadine  10  mg Oral Daily   melatonin  3 mg Oral QHS   metoprolol tartrate  12.5 mg Oral BID   omega-3 acid ethyl esters  1 capsule Oral BID   cyanocobalamin  500 mcg Oral QHS   vitamin E  400 Units Oral Daily   Warfarin - Pharmacist Dosing Inpatient   Does not apply q1600   Continuous Infusions:  Nutritional status     Body mass index is 21.05 kg/m.  Data Reviewed:   CBC: Recent Labs  Lab 02/25/23 1250 02/27/23 0353  WBC 7.7 6.1  HGB 12.1* 11.7*  HCT 36.2* 34.9*  MCV 98.4 98.0  PLT 181 158   Basic Metabolic Panel: Recent Labs  Lab  02/25/23 1250 02/27/23 0353  NA 131* 130*  K 4.4 4.2  CL 96* 96*  CO2 22 23  GLUCOSE 122* 101*  BUN 20 22  CREATININE 1.48* 1.25*  CALCIUM 9.9 9.4  MG  --  1.7  PHOS  --  3.6   GFR: Estimated Creatinine Clearance: 50.8 mL/min (A) (by C-G formula based on SCr of 1.25 mg/dL (H)). Liver Function Tests: Recent Labs  Lab 02/25/23 1250 02/27/23 0353  AST 21  --   ALT 13  --   ALKPHOS 66  --   BILITOT 0.9  --   PROT 6.7  --   ALBUMIN 3.8 3.2*   No results for input(s): "LIPASE", "AMYLASE" in the last 168 hours. No results for input(s): "AMMONIA" in the last 168 hours. Coagulation Profile: Recent Labs  Lab 02/25/23 1839 02/27/23 0353  INR 2.4* 2.5*   Cardiac Enzymes: No results for input(s): "CKTOTAL", "CKMB", "CKMBINDEX", "TROPONINI" in the last 168 hours. BNP (last 3 results) No results for input(s): "PROBNP" in the last 8760 hours. HbA1C: No results for input(s): "HGBA1C" in the last 72 hours. CBG: Recent Labs  Lab 02/25/23 1250  GLUCAP 124*   Lipid Profile: No results for input(s): "CHOL", "HDL", "LDLCALC", "TRIG", "CHOLHDL", "LDLDIRECT" in the last 72 hours. Thyroid Function Tests: Recent Labs    02/27/23 0353  TSH 1.253   Anemia Panel: No results for input(s): "VITAMINB12", "FOLATE", "FERRITIN", "TIBC", "IRON", "RETICCTPCT" in the last 72 hours. Sepsis Labs: No results for input(s): "PROCALCITON", "LATICACIDVEN" in the last 168 hours.  Recent Results (from the past 240 hour(s))  Resp panel by RT-PCR (RSV, Flu A&B, Covid) Anterior Nasal Swab     Status: None   Collection Time: 02/25/23  6:39 PM   Specimen: Anterior Nasal Swab  Result Value Ref Range Status   SARS Coronavirus 2 by RT PCR NEGATIVE NEGATIVE Final   Influenza A by PCR NEGATIVE NEGATIVE Final   Influenza B by PCR NEGATIVE NEGATIVE Final    Comment: (NOTE) The Xpert Xpress SARS-CoV-2/FLU/RSV plus assay is intended as an aid in the diagnosis of influenza from Nasopharyngeal swab specimens  and should not be used as a sole basis for treatment. Nasal washings and aspirates are unacceptable for Xpert Xpress SARS-CoV-2/FLU/RSV testing.  Fact Sheet for Patients: BloggerCourse.com  Fact Sheet for Healthcare Providers: SeriousBroker.it  This test is not yet approved or cleared by the [place] FDA and has been authorized for detection and/or diagnosis of SARS-CoV-2 by FDA under an Emergency Use Authorization (EUA). This EUA will remain in effect (meaning this test can be used) for the duration of the COVID-19 declaration under Section 564(b)(1) of the Act, 21 U.S.C. section 360bbb-3(b)(1), unless the authorization is terminated or revoked.     Resp Syncytial Virus by PCR  NEGATIVE NEGATIVE Final    Comment: (NOTE) Fact Sheet for Patients: BloggerCourse.com  Fact Sheet for Healthcare Providers: SeriousBroker.it  This test is not yet approved or cleared by the [place] FDA and has been authorized for detection and/or diagnosis of SARS-CoV-2 by FDA under an Emergency Use Authorization (EUA). This EUA will remain in effect (meaning this test can be used) for the duration of the COVID-19 declaration under Section 564(b)(1) of the Act, 21 U.S.C. section 360bbb-3(b)(1), unless the authorization is terminated or revoked.  Performed at [facility], [address] [place], [place] [address]          Radiology Studies: DG Chest 2 View  Result Date: 02/25/2023 CLINICAL DATA:  Chills. EXAM: CHEST - 2 VIEW COMPARISON:  July 23, 2022 FINDINGS: The cardiac silhouette is within normal limits. There is marked severity tortuosity of the descending thoracic aorta. Both lungs are clear. Multilevel degenerative changes are noted throughout the thoracic spine. IMPRESSION: No active cardiopulmonary disease. Electronically Signed   By: Aram Candela M.D.   On:  02/25/2023 18:12           LOS: 1 day   Time spent= 35 mins    Miguel Rota, MD [facility]  If 7PM-7AM, please contact night-coverage  02/27/2023, 8:30 AM

## 2023-02-27 NOTE — Progress Notes (Signed)
Patient reporting of bloating and abd discomfort. KUB shows ileus vs early SBO. CT A/P ordered with PO contrast. Clear liquid diet for now.

## 2023-02-28 DIAGNOSIS — H04123 Dry eye syndrome of bilateral lacrimal glands: Secondary | ICD-10-CM | POA: Diagnosis not present

## 2023-02-28 DIAGNOSIS — M6281 Muscle weakness (generalized): Secondary | ICD-10-CM | POA: Diagnosis not present

## 2023-02-28 DIAGNOSIS — K8020 Calculus of gallbladder without cholecystitis without obstruction: Secondary | ICD-10-CM | POA: Diagnosis not present

## 2023-02-28 DIAGNOSIS — B957 Other staphylococcus as the cause of diseases classified elsewhere: Secondary | ICD-10-CM | POA: Diagnosis not present

## 2023-02-28 DIAGNOSIS — T7840XD Allergy, unspecified, subsequent encounter: Secondary | ICD-10-CM | POA: Diagnosis not present

## 2023-02-28 DIAGNOSIS — G6289 Other specified polyneuropathies: Secondary | ICD-10-CM | POA: Diagnosis not present

## 2023-02-28 DIAGNOSIS — R61 Generalized hyperhidrosis: Secondary | ICD-10-CM | POA: Diagnosis not present

## 2023-02-28 DIAGNOSIS — Z7901 Long term (current) use of anticoagulants: Secondary | ICD-10-CM | POA: Diagnosis not present

## 2023-02-28 DIAGNOSIS — I493 Ventricular premature depolarization: Secondary | ICD-10-CM | POA: Diagnosis not present

## 2023-02-28 DIAGNOSIS — I4821 Permanent atrial fibrillation: Secondary | ICD-10-CM | POA: Diagnosis not present

## 2023-02-28 DIAGNOSIS — K5669 Other partial intestinal obstruction: Secondary | ICD-10-CM | POA: Diagnosis not present

## 2023-02-28 DIAGNOSIS — I4891 Unspecified atrial fibrillation: Secondary | ICD-10-CM | POA: Diagnosis present

## 2023-02-28 DIAGNOSIS — M109 Gout, unspecified: Secondary | ICD-10-CM | POA: Diagnosis not present

## 2023-02-28 DIAGNOSIS — L700 Acne vulgaris: Secondary | ICD-10-CM | POA: Diagnosis not present

## 2023-02-28 DIAGNOSIS — Z1152 Encounter for screening for COVID-19: Secondary | ICD-10-CM | POA: Diagnosis not present

## 2023-02-28 DIAGNOSIS — I420 Dilated cardiomyopathy: Secondary | ICD-10-CM | POA: Diagnosis not present

## 2023-02-28 DIAGNOSIS — Z23 Encounter for immunization: Secondary | ICD-10-CM | POA: Diagnosis not present

## 2023-02-28 DIAGNOSIS — R001 Bradycardia, unspecified: Secondary | ICD-10-CM | POA: Diagnosis not present

## 2023-02-28 DIAGNOSIS — I130 Hypertensive heart and chronic kidney disease with heart failure and stage 1 through stage 4 chronic kidney disease, or unspecified chronic kidney disease: Secondary | ICD-10-CM | POA: Diagnosis not present

## 2023-02-28 DIAGNOSIS — I452 Bifascicular block: Secondary | ICD-10-CM | POA: Diagnosis not present

## 2023-02-28 DIAGNOSIS — I10 Essential (primary) hypertension: Secondary | ICD-10-CM | POA: Diagnosis not present

## 2023-02-28 DIAGNOSIS — R634 Abnormal weight loss: Secondary | ICD-10-CM | POA: Diagnosis not present

## 2023-02-28 DIAGNOSIS — G629 Polyneuropathy, unspecified: Secondary | ICD-10-CM | POA: Diagnosis not present

## 2023-02-28 DIAGNOSIS — I4811 Longstanding persistent atrial fibrillation: Secondary | ICD-10-CM | POA: Diagnosis not present

## 2023-02-28 DIAGNOSIS — Z7401 Bed confinement status: Secondary | ICD-10-CM | POA: Diagnosis not present

## 2023-02-28 DIAGNOSIS — N132 Hydronephrosis with renal and ureteral calculous obstruction: Secondary | ICD-10-CM | POA: Diagnosis not present

## 2023-02-28 DIAGNOSIS — K56600 Partial intestinal obstruction, unspecified as to cause: Secondary | ICD-10-CM | POA: Diagnosis not present

## 2023-02-28 DIAGNOSIS — R7881 Bacteremia: Secondary | ICD-10-CM | POA: Diagnosis not present

## 2023-02-28 DIAGNOSIS — R509 Fever, unspecified: Secondary | ICD-10-CM | POA: Diagnosis not present

## 2023-02-28 DIAGNOSIS — N200 Calculus of kidney: Secondary | ICD-10-CM | POA: Diagnosis not present

## 2023-02-28 DIAGNOSIS — I5022 Chronic systolic (congestive) heart failure: Secondary | ICD-10-CM | POA: Diagnosis not present

## 2023-02-28 DIAGNOSIS — N183 Chronic kidney disease, stage 3 unspecified: Secondary | ICD-10-CM | POA: Diagnosis not present

## 2023-02-28 DIAGNOSIS — I340 Nonrheumatic mitral (valve) insufficiency: Secondary | ICD-10-CM | POA: Diagnosis not present

## 2023-02-28 DIAGNOSIS — N1831 Chronic kidney disease, stage 3a: Secondary | ICD-10-CM | POA: Diagnosis not present

## 2023-02-28 DIAGNOSIS — B952 Enterococcus as the cause of diseases classified elsewhere: Secondary | ICD-10-CM | POA: Diagnosis not present

## 2023-02-28 DIAGNOSIS — N136 Pyonephrosis: Secondary | ICD-10-CM | POA: Diagnosis not present

## 2023-02-28 DIAGNOSIS — I129 Hypertensive chronic kidney disease with stage 1 through stage 4 chronic kidney disease, or unspecified chronic kidney disease: Secondary | ICD-10-CM | POA: Diagnosis not present

## 2023-02-28 DIAGNOSIS — R531 Weakness: Secondary | ICD-10-CM | POA: Diagnosis not present

## 2023-02-28 DIAGNOSIS — Z833 Family history of diabetes mellitus: Secondary | ICD-10-CM | POA: Diagnosis not present

## 2023-02-28 DIAGNOSIS — Z6821 Body mass index (BMI) 21.0-21.9, adult: Secondary | ICD-10-CM | POA: Diagnosis not present

## 2023-02-28 DIAGNOSIS — K5730 Diverticulosis of large intestine without perforation or abscess without bleeding: Secondary | ICD-10-CM | POA: Diagnosis not present

## 2023-02-28 LAB — PHOSPHORUS: Phosphorus: 3.2 mg/dL (ref 2.5–4.6)

## 2023-02-28 LAB — HEPARIN LEVEL (UNFRACTIONATED)
Heparin Unfractionated: <0.1 [IU]/mL — ABNORMAL LOW (ref 0.30–0.70)
Heparin Unfractionated: 0.38 [IU]/mL (ref 0.30–0.70)

## 2023-02-28 LAB — BASIC METABOLIC PANEL
Anion gap: 9 (ref 5–15)
BUN: 22 mg/dL (ref 8–23)
CO2: 23 mmol/L (ref 22–32)
Calcium: 9.5 mg/dL (ref 8.9–10.3)
Chloride: 99 mmol/L (ref 98–111)
Creatinine, Ser: 1.42 mg/dL — ABNORMAL HIGH (ref 0.61–1.24)
GFR, Estimated: 52 mL/min — ABNORMAL LOW (ref >60)
Glucose, Bld: 116 mg/dL — ABNORMAL HIGH (ref 70–99)
Potassium: 4.5 mmol/L (ref 3.5–5.1)
Sodium: 131 mmol/L — ABNORMAL LOW (ref 135–145)

## 2023-02-28 LAB — PROTIME-INR
INR: 1.9 — ABNORMAL HIGH (ref 0.8–1.2)
Prothrombin Time: 22.3 s — ABNORMAL HIGH (ref 11.4–15.2)

## 2023-02-28 LAB — CBC
HCT: 34.8 % — ABNORMAL LOW (ref 39.0–52.0)
Hemoglobin: 11.5 g/dL — ABNORMAL LOW (ref 13.0–17.0)
MCH: 32.7 pg (ref 26.0–34.0)
MCHC: 33 g/dL (ref 30.0–36.0)
MCV: 98.9 fL (ref 80.0–100.0)
Platelets: 163 10*3/uL (ref 150–400)
RBC: 3.52 MIL/uL — ABNORMAL LOW (ref 4.22–5.81)
RDW: 13.9 % (ref 11.5–15.5)
WBC: 5.9 10*3/uL (ref 4.0–10.5)
nRBC: 0 % (ref 0.0–0.2)

## 2023-02-28 LAB — MAGNESIUM: Magnesium: 1.8 mg/dL (ref 1.7–2.4)

## 2023-02-28 MED ORDER — METOPROLOL TARTRATE 12.5 MG HALF TABLET: 12.5000 mg | ORAL_TABLET | Freq: Two times a day (BID) | ORAL | Status: DC

## 2023-02-28 MED ORDER — SODIUM CHLORIDE 0.9 % IV SOLN: INTRAVENOUS | Status: AC

## 2023-02-28 MED ORDER — ACETAMINOPHEN 325 MG PO TABS
650.0000 mg | ORAL_TABLET | Freq: Four times a day (QID) | ORAL | Status: DC | PRN
  Administered 2023-02-28 – 2023-03-05 (×6): 650 mg via ORAL
  Filled 2023-02-28 (×6): qty 2

## 2023-02-28 MED ORDER — CLONIDINE HCL 0.2 MG PO TABS: 0.2000 mg | ORAL_TABLET | Freq: Two times a day (BID) | ORAL | Status: DC

## 2023-02-28 MED ORDER — HEPARIN (PORCINE) 25000 UT/250ML-% IV SOLN
1000.0000 [IU]/h | INTRAVENOUS | Status: DC
  Administered 2023-02-28: 1000 [IU]/h via INTRAVENOUS
  Filled 2023-02-28: qty 250

## 2023-02-28 NOTE — Progress Notes (Signed)
PHARMACY - ANTICOAGULATION CONSULT NOTE  Pharmacy Consult for Warfarin>>>>>Heparin Indication: atrial fibrillation  Allergies  Allergen Reactions   Allopurinol Other (See Comments)   Colchicine Other (See Comments)   Duloxetine Other (See Comments)    More nervous   Elemental Sulfur Nausea And Vomiting   Oxycontin [Oxycodone Hcl] Nausea And Vomiting    Patient Measurements: Height: 6' (182.9 cm) Weight: 70.4 kg (155 lb 3.3 oz) IBW/kg (Calculated) : 77.6 Heparin dosing weight: 70.4 kg  Vital Signs: Temp: 98 F (36.7 C) (11/03 1343) Temp Source: Oral (11/03 1343) BP: 101/82 (11/03 1343) Pulse Rate: 79 (11/03 1343)  Labs: Recent Labs    02/25/23 1839 02/27/23 0353 02/28/23 0353 02/28/23 1337  HGB  --  11.7* 11.5*  --   HCT  --  34.9* 34.8*  --   PLT  --  158 163  --   LABPROT 26.3* 27.3* 22.3*  --   INR 2.4* 2.5* 1.9*  --   HEPARINUNFRC  --   --   --  <0.10*  CREATININE  --  1.25* 1.42*  --   TROPONINIHS 13  --   --   --     Estimated Creatinine Clearance: 44.8 mL/min (A) (by C-G formula based on SCr of 1.42 mg/dL (H)).   Medical History: Past Medical History:  Diagnosis Date   Atrial fibrillation (HCC) 02/08/2013   CHADS-VAS - 2 (AGE, HTN). On anticoagulation. Holter 6/14 - 2.3 sec pause. Avg HR 87. 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 He is on chronic warfarin for history of afib. PTA dose per [facility] clinic notes is 7.5 mg Tuesday and 5 mg AOD. However, per med history patient  reports taking 5 mg daily.   Patient found to have a new SBO and was transitioned to heparin overnight. Heparin level returned undetectably low at < 0.1 on 100 units/hr. Of note, he did receive his last dose of warfarin on 11/2 at 1600, so will continue to monitor INR. Per RN, the pump has been alarming all day due to the line being crimped when patient moves his arm, therefore heparin has not been infusing consistently throughout the day and likely explains undetectable level. No new bleeding reported.   Goal of Therapy:  Heparin level 0.3-0.7 units/mL INR 2-3 Monitor platelets by anticoagulation protocol: Yes   Plan:  Continue heparin at 1000 units/hr Heparin level in 5 hours Heparin level, INR, and CBC daily while on heparin Follow-up transition back to warfarin  Lennie Muckle, PharmD PGY1 Pharmacy Resident 02/28/2023 2:54 PM

## 2023-02-28 NOTE — Progress Notes (Signed)
PHARMACY - ANTICOAGULATION CONSULT NOTE  Pharmacy Consult for Warfarin>>>>>Heparin Indication: atrial fibrillation  Allergies  Allergen Reactions   Allopurinol Other (See Comments)   Colchicine Other (See Comments)   Duloxetine Other (See Comments)    More nervous   Elemental Sulfur Nausea And Vomiting   Oxycontin [Oxycodone Hcl] Nausea And Vomiting    Patient Measurements: Height: 6' (182.9 cm) Weight: 70.4 kg (155 lb 3.3 oz) IBW/kg (Calculated) : 77.6 Heparin dosing weight: 70.4 kg  Vital Signs: Temp: 97.4 F (36.3 C) (11/03 2012) Temp Source: Oral (11/03 2012) BP: 102/73 (11/03 2012) Pulse Rate: 60 (11/03 2012)  Labs: Recent Labs    02/27/23 0353 02/28/23 0353 02/28/23 1337 02/28/23 2056  HGB 11.7* 11.5*  --   --   HCT 34.9* 34.8*  --   --   PLT 158 163  --   --   LABPROT 27.3* 22.3*  --   --   INR 2.5* 1.9*  --   --   HEPARINUNFRC  --   --  <0.10* 0.38  CREATININE 1.25* 1.42*  --   --     Estimated Creatinine Clearance: 44.8 mL/min (A) (by C-G formula based on SCr of 1.42 mg/dL (H)).   Medical History: Past Medical History:  Diagnosis Date   Atrial fibrillation (HCC) 02/08/2013   CHADS-VAS - 2 (AGE, HTN). On anticoagulation. Holter 6/14 - 2.3 sec pause. Avg HR 87. 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 He is on chronic warfarin for history of afib. PTA dose per [facility] clinic notes is 7.5 mg Tuesday and 5 mg AOD. However, per med history patient reports taking 5 mg daily.    Heparin level therapeutic on 1000 units/hr  Goal of Therapy:  Heparin level 0.3-0.7 units/mL INR 2-3 Monitor platelets by anticoagulation protocol: Yes   Plan:  Continue heparin gtt at 1000 units/hr Daily heparin level, CBC, s/s bleeding  Daylene Posey, PharmD, [facility] Clinical Pharmacist ED Pharmacist Phone # [phone] 02/28/2023 9:40 PM

## 2023-02-28 NOTE — Progress Notes (Addendum)
PROGRESS NOTE    Ralph Sullivan  WUJ:[id number] DOB: 12-13-1945 DOA: 02/25/2023 PCP: Lorenda Ishihara, MD  Chief Complaint  Patient presents with   Weakness    Brief Narrative:   77 year old with history of Ralph Sullivan-fib on Coumadin, hyperhidrosis, CKD stage IIIa, CVA comes to the ED for generalized weakness. Patient was brought to the hospital evaluated by therapy and would need placement but hospital course was complicated by Ralph Sullivan-fib with RVR therefore admitted.   Assessment & Plan:   Principal Problem:   Ralph Sullivan-fib (HCC) Active Problems:   Weakness  Small Bowel Obstruction Small Bowel Lesion Appreciate surgery assistance  Clears, will follow CEA pending  Atrial fibrillation with RVR with history of chronic atrial fibrillation on Coumadin Initially while working with therapy patient went into atrial fibrillation with RVR, heart rate has improved now.   Metoprolol on hold IV metop prn  Heparin gtt (on warfarin at home) His last echocardiogram was in January 2024 which showed EF of 45%, mild LVH, enlarged RV and dilated cardiomyopathy.   Seen by outpatient cardiology Dr. Anne Fu in September 2024.  Beta-blocker was reduced due to chronic hyperhidrosis  Bradycardia  3 Second Pause Hold clonidine and metoprolol  Generalized weakness and deconditioning Attributed to his underlying cardiac condition and age related.  PT/OT.   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  Metoprolol on hold.   CKD stage IIIa Cr around baseline 1.4    DVT prophylaxis: heparin gtt Code Status: full Family Communication: none Disposition:   Status is: Observation The patient will require care spanning > 2 midnights and should be moved to inpatient because: need for continued inpatient care   Consultants:  surgery  Procedures:  none  Antimicrobials:  Anti-infectives (From admission, onward)     None       Subjective: Passed gas this AM Some nausea  Objective: Vitals:   02/28/23 0022 02/28/23 0342 02/28/23 0813 02/28/23 1343  BP: 101/69 100/75 107/72 101/82  Pulse: 76 73 65 79  Resp: 20 16 17 20   Temp: 97.9 F (36.6 C) 97.8 F (36.6 C) (!) 97.5 F (36.4 C) 98 F (36.7 C)  TempSrc: Oral Oral Oral Oral  SpO2: 98% 98% 100% 99%  Weight:      Height:        Intake/Output Summary (Last 24 hours) at 02/28/2023 1355 Last data filed at 02/28/2023 0700 Gross per 24 hour  Intake 358.9 ml  Output 420 ml  Net -61.1 ml   Filed Weights   02/25/23 1235 02/27/23 1500  Weight: 70.4 kg 70.4 kg    Examination:  General exam: Appears calm and comfortable  Respiratory system: unlabored Cardiovascular system: irregularly irregular  Gastrointestinal system: non tender, mild distension  Central nervous system: Alert and oriented. No focal neurological deficits. Extremities: no LEE    Data Reviewed: I have personally reviewed following labs and imaging studies  CBC: Recent Labs  Lab 02/25/23 1250 02/27/23 0353 02/28/23 0353  WBC 7.7 6.1 5.9  HGB 12.1* 11.7* 11.5*  HCT 36.2* 34.9* 34.8*  MCV 98.4 98.0 98.9  PLT 181 158 163    Basic Metabolic Panel: Recent Labs  Lab 02/25/23 1250 02/27/23 0353 02/28/23 0353  NA 131* 130* 131*  K 4.4 4.2 4.5  CL 96* 96* 99  CO2 22 23 23   GLUCOSE 122* 101* 116*  BUN 20 22 22   CREATININE 1.48* 1.25* 1.42*  CALCIUM  9.9 9.4 9.5  MG  --  1.7 1.8  PHOS  --  3.6 3.2    GFR: Estimated Creatinine Clearance: 44.8 mL/min (Vincent Streater) (by C-G formula based on SCr of 1.42 mg/dL (H)).  Liver Function Tests: Recent Labs  Lab 02/25/23 1250 02/27/23 0353  AST 21  --   ALT 13  --   ALKPHOS 66  --   BILITOT 0.9  --   PROT 6.7  --   ALBUMIN 3.8 3.2*    CBG: Recent Labs  Lab 02/25/23 1250  GLUCAP 124*     Recent Results (from the past 240 hour(s))  Resp panel by RT-PCR (RSV, Flu Saladin Petrelli&B, Covid) Anterior Nasal Swab     Status:  None   Collection Time: 02/25/23  6:39 PM   Specimen: Anterior Nasal Swab  Result Value Ref Range Status   SARS Coronavirus 2 by RT PCR NEGATIVE NEGATIVE Final   Influenza Katelin Kutsch by PCR NEGATIVE NEGATIVE Final   Influenza B by PCR NEGATIVE NEGATIVE Final    Comment: (NOTE) The Xpert Xpress SARS-CoV-2/FLU/RSV plus assay is intended as an aid in the diagnosis of influenza from Nasopharyngeal swab specimens and should not be used as Jerline Linzy sole basis for treatment. Nasal washings and aspirates are unacceptable for Xpert Xpress SARS-CoV-2/FLU/RSV testing.  Fact Sheet for Patients: BloggerCourse.com  Fact Sheet for Healthcare Providers: SeriousBroker.it  This test is not yet approved or cleared by the [place] FDA and has been authorized for detection and/or diagnosis of SARS-CoV-2 by FDA under an Emergency Use Authorization (EUA). This EUA will remain in effect (meaning this test can be used) for the duration of the COVID-19 declaration under Section 564(b)(1) of the Act, 21 U.S.C. section 360bbb-3(b)(1), unless the authorization is terminated or revoked.     Resp Syncytial Virus by PCR NEGATIVE NEGATIVE Final    Comment: (NOTE) Fact Sheet for Patients: BloggerCourse.com  Fact Sheet for Healthcare Providers: SeriousBroker.it  This test is not yet approved or cleared by the [place] FDA and has been authorized for detection and/or diagnosis of SARS-CoV-2 by FDA under an Emergency Use Authorization (EUA). This EUA will remain in effect (meaning this test can be used) for the duration of the COVID-19 declaration under Section 564(b)(1) of the Act, 21 U.S.C. section 360bbb-3(b)(1), unless the authorization is terminated or revoked.  Performed at [facility], [address], [place], [place] [address]          Radiology Studies: CT ABDOMEN PELVIS WO CONTRAST  Result  Date: 02/27/2023 CLINICAL DATA:  Abdominal pain, bowel obstruction suspected EXAM: CT ABDOMEN AND PELVIS WITHOUT CONTRAST TECHNIQUE: Multidetector CT imaging of the abdomen and pelvis was performed following the standard protocol without IV contrast. RADIATION DOSE REDUCTION: This exam was performed according to the departmental dose-optimization program which includes automated exposure control, adjustment of the mA and/or kV according to patient size and/or use of iterative reconstruction technique. COMPARISON:  Abdominal radiograph dated 12/28/2022. CT abdomen/pelvis dated 07/16/2016. FINDINGS: Lower chest: Mild scarring in the left lower lobe. Hepatobiliary: Unenhanced liver is unremarkable, noting Mabry Tift benign calcified granuloma. Layering tiny gallstones (series 3/image 29), without associated inflammatory changes. No intrahepatic or extrahepatic ductal dilatation. Pancreas: Within normal limits. Spleen: Within normal limits. Adrenals/Urinary Tract: Adrenal glands are within normal limits. Numerous bilateral renal cysts, measuring up to 3.4 cm in the left lower kidney (series 3/image 41), benign (Bosniak I). No follow-up is recommended. Bilateral renal cortical atrophy. Multiple nonobstructing bilateral renal calculi measuring up to  8 mm in the right upper kidney (series 3/image 33). No ureteral or bladder calculi. No hydronephrosis. Bladder is within normal limits. Stomach/Bowel: Stomach is within normal limits. Dilated small bowel throughout the abdomen. Transition point in distal ileum where there is short segment narrowing (series 3/image 35; coronal image 61). An underlying small-bowel lesion is possible. Normal appendix (series 3/image 3). Left colonic diverticulosis, without evidence of diverticulitis. Vascular/Lymphatic: No evidence of abdominal aortic aneurysm. Atherosclerotic calcifications of the abdominal aorta and branch vessels. No suspicious abdominopelvic lymphadenopathy. Reproductive: Dystrophic  calcifications in the prostate. Other: No abdominopelvic ascites. Tiny fat containing periumbilical hernia (series 3/image 46). Musculoskeletal: Degenerative changes of the visualized thoracolumbar spine. Status post ORIF of the left hip. IMPRESSION: Small bowel obstruction with transition point in the distal ileum. An underlying small bowel lesion is possible. Additional ancillary findings as above. Electronically Signed   By: Charline Bills M.D.   On: 02/27/2023 21:19   DG Abd 1 View  Result Date: 02/27/2023 CLINICAL DATA:  Abdominal pain. EXAM: ABDOMEN - 1 VIEW COMPARISON:  Abdominopelvic CT 07/16/2016. FINDINGS: 1600 hours. Single portable supine view of the abdomen demonstrates mildly dilated small bowel loops in the central abdomen, asymmetric to the right. The stomach appears mildly distended. No significant colonic distention identified. There is no supine evidence of pneumoperitoneum. Previous CT demonstrated Laurajean Hosek left inguinal hernia which is not visualized on this examination. Possible right renal and bladder calculi. Severe lumbar spondylosis associated with Alexius Hangartner convex left scoliosis. IMPRESSION: Mildly dilated small bowel loops in the central abdomen, asymmetric to the right, suboptimally evaluated on this single supine examination. Findings could be secondary to an ileus versus early or partial small bowel obstruction. Correlate clinically. Consider further evaluation with follow-up abdominopelvic CT. Electronically Signed   By: Carey Bullocks M.D.   On: 02/27/2023 16:26        Scheduled Meds:  cloNIDine  0.2 mg Oral TID   fluticasone  2 spray Each Nare Daily   lidocaine  1 patch Transdermal Q24H   pantoprazole (PROTONIX) IV  40 mg Intravenous Q24H   Continuous Infusions:  sodium chloride 75 mL/hr at 02/28/23 0700   heparin 1,000 Units/hr (02/28/23 0700)     LOS: 1 day    Time spent: over 30 min    Lacretia Nicks, MD [facility]   To contact the attending provider  between 7A-7P or the covering provider during after hours 7P-7A, please log into the web site [email or web address] and access using universal [place] password for that web site. If you do not have the password, please call the hospital operator.  02/28/2023, 1:55 PM

## 2023-02-28 NOTE — Progress Notes (Signed)
PHARMACY - ANTICOAGULATION CONSULT NOTE  Pharmacy Consult for Warfarin>>>>>Heparin Indication: atrial fibrillation  Allergies  Allergen Reactions   Allopurinol Other (See Comments)   Colchicine Other (See Comments)   Duloxetine Other (See Comments)    More nervous   Elemental Sulfur Nausea And Vomiting   Oxycontin [Oxycodone Hcl] Nausea And Vomiting    Patient Measurements: Height: 6' (182.9 cm) Weight: 70.4 kg (155 lb 3.3 oz) IBW/kg (Calculated) : 77.6  Vital Signs: Temp: 97.8 F (36.6 C) (11/03 0342) Temp Source: Oral (11/03 0342) BP: 100/75 (11/03 0342) Pulse Rate: 73 (11/03 0342)  Labs: Recent Labs    02/25/23 1250 02/25/23 1514 02/25/23 1839 02/27/23 0353 02/28/23 0353  HGB 12.1*  --   --  11.7* 11.5*  HCT 36.2*  --   --  34.9* 34.8*  PLT 181  --   --  158 163  LABPROT  --   --  26.3* 27.3* 22.3*  INR  --   --  2.4* 2.5* 1.9*  CREATININE 1.48*  --   --  1.25*  --   TROPONINIHS  --  15 13  --   --     Estimated Creatinine Clearance: 50.8 mL/min (A) (by C-G formula based on SCr of 1.25 mg/dL (H)).   Medical History: Past Medical History:  Diagnosis Date   Atrial fibrillation (HCC) 02/08/2013   CHADS-VAS - 2 (AGE, HTN). On anticoagulation. Holter 6/14 - 2.3 sec pause. Avg HR 87. 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 He is on chronic warfarin for history of afib. PTA dose per [facility] clinic notes is 7.5 mg Tuesday and 5 mg AOD. However, per med history patient reports taking 5 mg  daily.   INR therapeutic today at 2.5. No new bleeding noted per RN.   11/3 AM update:  CT Abdomen with SBO, NPO for now Holding warfarin and starting heparin when INR is <2  11/3 AM update #2:  INR 1.9 this AM Will start heparin  Goal of Therapy:  Heparin level 0.3-0.7 units/mL INR 2-3 Monitor platelets by anticoagulation protocol: Yes   Plan:  Hold warfarin  Start heparin at 1000 units/hr Heparin level in 8 hours  Abran Duke, PharmD, BCPS Clinical Pharmacist Phone: [phone]

## 2023-02-28 NOTE — Progress Notes (Signed)
PHARMACY - ANTICOAGULATION CONSULT NOTE  Pharmacy Consult for Warfarin>>>>>Heparin Indication: atrial fibrillation  Allergies  Allergen Reactions   Allopurinol Other (See Comments)   Colchicine Other (See Comments)   Duloxetine Other (See Comments)    More nervous   Elemental Sulfur Nausea And Vomiting   Oxycontin [Oxycodone Hcl] Nausea And Vomiting    Patient Measurements: Height: 6' (182.9 cm) Weight: 70.4 kg (155 lb 3.3 oz) IBW/kg (Calculated) : 77.6  Vital Signs: Temp: 98.2 F (36.8 C) (11/02 1922) Temp Source: Oral (11/02 1922) BP: 161/107 (11/02 1922) Pulse Rate: 81 (11/02 2300)  Labs: Recent Labs    02/25/23 1250 02/25/23 1514 02/25/23 1839 02/27/23 0353  HGB 12.1*  --   --  11.7*  HCT 36.2*  --   --  34.9*  PLT 181  --   --  158  LABPROT  --   --  26.3* 27.3*  INR  --   --  2.4* 2.5*  CREATININE 1.48*  --   --  1.25*  TROPONINIHS  --  15 13  --     Estimated Creatinine Clearance: 50.8 mL/min (A) (by C-G formula based on SCr of 1.25 mg/dL (H)).   Medical History: Past Medical History:  Diagnosis Date   Atrial fibrillation (HCC) 02/08/2013   CHADS-VAS - 2 (AGE, HTN). On anticoagulation. Holter 6/14 - 2.3 sec pause. Avg HR 87. 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 He is on chronic warfarin for history of afib. PTA dose per [facility] clinic notes is 7.5 mg Tuesday and 5 mg AOD. However, per med history patient reports taking 5 mg daily.   INR therapeutic today at 2.5. No new  bleeding noted per RN.   11/3 AM update:  CT Abdomen with SBO, NPO for now Holding warfarin and starting heparin when INR is <2  Goal of Therapy:  Heparin level 0.3-0.7 units/mL INR 2-3 Monitor platelets by anticoagulation protocol: Yes   Plan:  Hold warfarin  Start heparin when INR is <2  Abran Duke, PharmD, BCPS Clinical Pharmacist Phone: [phone]

## 2023-02-28 NOTE — Consult Note (Signed)
Reason for Consult:bowel obstruction Referring Provider: Lacretia Nicks  [name] is an 77 y.o. male.  HPI: 77 yo male with 1 week of bloating. He has noticed when he eats in the last few days his abdomen gets distended and he has crampy pains. His last bowel movements was 2 days ago. He has been having flatulence overnight. He recently tried potatoes and that gave him the crampy pain.  He lost his wife in 2006. Since 2017 he has lost about 80 lbs. He generally eats microwave dinners. He has been taking boost since 2006 as he developed heart issues and some weakness around that time.  He came into the hospital because when he tried to stand up he felt weak and was unable to stand.  Past Medical History:  Diagnosis Date   Atrial fibrillation (HCC) 02/08/2013   CHADS-VAS - 2 (AGE, HTN). On anticoagulation. Holter 6/14 - 2.3 sec pause. Avg HR 87. 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    Past Surgical History:  Procedure Laterality Date   CATARACT EXTRACTION     HERNIA REPAIR     INGUINAL HERNIA REPAIR  07/16/2016   INGUINAL HERNIA REPAIR Left 07/16/2016   Procedure: LAPAROSCOPIC INGUINAL HERNIA REPAIR;  Surgeon: Rodman Pickle, MD;  Location: [facility] OR;  Service: General;  Laterality: Left;   INSERTION OF MESH Left 07/16/2016   Procedure: INSERTION OF MESH;  Surgeon: De Blanch Roma Bondar, MD;  Location: [facility] OR;  Service: General;  Laterality: Left;   KIDNEY STONE SURGERY      Family History  Problem Relation Age of Onset   Hypertension Mother    Diabetes Mother     Social History:   reports that he quit smoking about 52 years ago. His smoking use included cigarettes. He has never used smokeless tobacco. He reports that he does not drink alcohol and does not use drugs.  Allergies:  Allergies  Allergen Reactions   Allopurinol Other (See Comments)   Colchicine Other (See Comments)   Duloxetine Other (See Comments)    More nervous   Elemental Sulfur Nausea And Vomiting   Oxycontin [Oxycodone Hcl] Nausea And Vomiting    Medications: I have reviewed the patient's current medications.  Results for orders placed or performed during the hospital encounter of 02/25/23 (from the past 48 hour(s))  Protime-INR     Status: Abnormal   Collection Time: 02/27/23  3:53 AM  Result Value Ref Range   Prothrombin Time 27.3 (H) 11.4 - 15.2 seconds   INR 2.5 (H) 0.8 - 1.2    Comment: (NOTE) INR goal varies based on device and disease states. Performed at [facility], [address] [place], [place] [address]   CBC     Status: Abnormal   Collection Time: 02/27/23  3:53 AM  Result Value Ref Range   WBC 6.1 4.0 - 10.5 K/uL   RBC 3.56 (L) 4.22 - 5.81 MIL/uL   Hemoglobin 11.7 (L) 13.0 - 17.0 g/dL   HCT 60.4 (L) 54.0 - 98.1 %   MCV 98.0 80.0 - 100.0 fL  MCH 32.9 26.0 - 34.0 pg   MCHC 33.5 30.0 - 36.0 g/dL   RDW 16.1 09.6 - 04.5 %   Platelets 158 150 - 400 K/uL   nRBC 0.0 0.0 - 0.2 %    Comment: Performed at [facility], [address] [place], [place] [address]  Renal function panel     Status: Abnormal   Collection Time: 02/27/23  3:53 AM  Result Value Ref Range   Sodium 130 (L) 135 - 145 mmol/L   Potassium 4.2 3.5 - 5.1 mmol/L   Chloride 96 (L) 98 - 111 mmol/L   CO2 23 22 - 32 mmol/L   Glucose, Bld 101 (H) 70 - 99 mg/dL    Comment: Glucose reference range applies only to samples taken after fasting for at least 8 hours.   BUN 22 8 - 23 mg/dL   Creatinine, Ser 1.91 (H) 0.61 - 1.24 mg/dL   Calcium 9.4 8.9 - 47.8 mg/dL   Phosphorus 3.6 2.5 - 4.6 mg/dL    Albumin 3.2 (L) 3.5 - 5.0 g/dL   GFR, Estimated >29 >56 mL/min    Comment: (NOTE) Calculated using the CKD-EPI Creatinine Equation (2021)    Anion gap 11 5 - 15    Comment: Performed at [facility], [address], [place], [place] [address]  Magnesium     Status: None   Collection Time: 02/27/23  3:53 AM  Result Value Ref Range   Magnesium 1.7 1.7 - 2.4 mg/dL    Comment: Performed at [facility], [address], [place], [place] [address]  TSH     Status: None   Collection Time: 02/27/23  3:53 AM  Result Value Ref Range   TSH 1.253 0.350 - 4.500 uIU/mL    Comment: Performed by a 3rd Generation assay with a functional sensitivity of <=0.01 uIU/mL. Performed at [facility], [address], [place], [place] [address]   VITAMIN D 25 Hydroxy (Vit-D Deficiency, Fractures)     Status: None   Collection Time: 02/27/23  9:06 AM  Result Value Ref Range   Vit D, 25-Hydroxy 62.27 30 - 100 ng/mL    Comment: (NOTE) Vitamin D deficiency has been defined by the Institute of Medicine  and an Endocrine Society practice guideline as a level of serum 25-OH  vitamin D less than 20 ng/mL (1,2). The Endocrine Society went on to  further define vitamin D insufficiency as a level between 21 and 29  ng/mL (2).  1. IOM (Institute of Medicine). 2010. Dietary reference intakes for  calcium and D. [place]: [employer]. 2. Holick MF, Binkley [place], Bischoff-Ferrari HA, et al. Evaluation,  treatment, and prevention of vitamin D deficiency: an Endocrine  Society clinical practice guideline, JCEM. 2011 Jul; 96(7): 1911-30.  Performed at [facility], [address] [place], [place] [address]   Protime-INR     Status: Abnormal   Collection Time: 02/28/23  3:53 AM  Result Value Ref Range   Prothrombin Time 22.3 (H) 11.4 - 15.2 seconds   INR 1.9 (H) 0.8 - 1.2    Comment: (NOTE) INR goal varies based on device and disease states. Performed at [facility], [address] [place], [place] [address]   CBC     Status: Abnormal   Collection Time: 02/28/23  3:53 AM  Result Value Ref Range   WBC 5.9 4.0 - 10.5 K/uL   RBC 3.52 (L)  4.22 - 5.81 MIL/uL   Hemoglobin 11.5 (L) 13.0 - 17.0 g/dL   HCT 82.9 (L) 56.2 - 13.0 %   MCV 98.9 80.0 - 100.0 fL   MCH 32.7 26.0 - 34.0 pg   MCHC 33.0 30.0 - 36.0 g/dL   RDW 86.5 78.4 - 69.6 %   Platelets 163 150 - 400 K/uL   nRBC 0.0 0.0 - 0.2 %    Comment: Performed at [facility], [address], [place], [place] [address]  Basic metabolic panel     Status: Abnormal   Collection Time: 02/28/23  3:53 AM  Result Value Ref Range   Sodium 131 (L) 135 - 145 mmol/L   Potassium 4.5 3.5 - 5.1 mmol/L   Chloride 99 98 - 111 mmol/L   CO2 23 22 - 32 mmol/L   Glucose, Bld 116 (H) 70 - 99 mg/dL    Comment: Glucose reference range applies only to samples taken after fasting for at least 8 hours.   BUN 22 8 - 23 mg/dL   Creatinine, Ser 4.13 (H) 0.61 - 1.24 mg/dL   Calcium 9.5 8.9 - 24.4 mg/dL   GFR, Estimated 52 (L) >60 mL/min    Comment: (NOTE) Calculated using the CKD-EPI Creatinine Equation (2021)    Anion gap 9 5 - 15    Comment: Performed at [facility], [address], [place], [place] [address]  Magnesium     Status: None   Collection Time: 02/28/23  3:53 AM  Result Value Ref Range   Magnesium 1.8 1.7 - 2.4 mg/dL    Comment: Performed at [facility], [address], [place], [place] [address]  Phosphorus     Status: None   Collection Time: 02/28/23  3:53 AM  Result Value Ref Range   Phosphorus 3.2 2.5 - 4.6 mg/dL    Comment: Performed at [facility], [address], [place], [place] [address]    CT ABDOMEN PELVIS WO CONTRAST  Result Date: 02/27/2023 CLINICAL DATA:  Abdominal pain, bowel obstruction suspected EXAM: CT ABDOMEN AND PELVIS WITHOUT CONTRAST TECHNIQUE: Multidetector CT imaging of the abdomen and pelvis was performed following the standard protocol without  IV contrast. RADIATION DOSE REDUCTION: This exam was performed according to the departmental dose-optimization program which includes automated exposure control, adjustment of the mA and/or kV according to patient size and/or use of iterative reconstruction technique. COMPARISON:  Abdominal radiograph dated 12/28/2022. CT abdomen/pelvis dated 07/16/2016. FINDINGS: Lower chest: Mild scarring in the left lower lobe. Hepatobiliary: Unenhanced liver is unremarkable, noting a benign calcified granuloma. Layering tiny gallstones (series 3/image 29), without associated inflammatory changes. No intrahepatic or extrahepatic ductal dilatation. Pancreas: Within normal limits. Spleen: Within normal limits. Adrenals/Urinary Tract: Adrenal glands are within normal limits. Numerous bilateral renal cysts, measuring up to 3.4 cm in the left lower kidney (series 3/image 41), benign (Bosniak I). No follow-up is recommended. Bilateral renal cortical atrophy. Multiple nonobstructing bilateral renal calculi measuring up to 8 mm in the right upper kidney (series 3/image 33). No ureteral or bladder calculi. No hydronephrosis. Bladder is within normal limits. Stomach/Bowel: Stomach is within normal limits. Dilated small bowel throughout the abdomen. Transition point in distal ileum where there is short segment narrowing (series 3/image 35; coronal image 61). An underlying small-bowel lesion is possible. Normal appendix (series 3/image 3). Left colonic diverticulosis, without evidence of diverticulitis. Vascular/Lymphatic: No evidence of abdominal aortic aneurysm. Atherosclerotic calcifications of the abdominal aorta and branch vessels. No  suspicious abdominopelvic lymphadenopathy. Reproductive: Dystrophic calcifications in the prostate. Other: No abdominopelvic ascites. Tiny fat containing periumbilical hernia (series 3/image 46). Musculoskeletal: Degenerative changes of the visualized thoracolumbar spine. Status post ORIF of the left hip.  IMPRESSION: Small bowel obstruction with transition point in the distal ileum. An underlying small bowel lesion is possible. Additional ancillary findings as above. Electronically Signed   By: Charline Bills M.D.   On: 02/27/2023 21:19   DG Abd 1 View  Result Date: 02/27/2023 CLINICAL DATA:  Abdominal pain. EXAM: ABDOMEN - 1 VIEW COMPARISON:  Abdominopelvic CT 07/16/2016. FINDINGS: 1600 hours. Single portable supine view of the abdomen demonstrates mildly dilated small bowel loops in the central abdomen, asymmetric to the right. The stomach appears mildly distended. No significant colonic distention identified. There is no supine evidence of pneumoperitoneum. Previous CT demonstrated a left inguinal hernia which is not visualized on this examination. Possible right renal and bladder calculi. Severe lumbar spondylosis associated with a convex left scoliosis. IMPRESSION: Mildly dilated small bowel loops in the central abdomen, asymmetric to the right, suboptimally evaluated on this single supine examination. Findings could be secondary to an ileus versus early or partial small bowel obstruction. Correlate clinically. Consider further evaluation with follow-up abdominopelvic CT. Electronically Signed   By: Carey Bullocks M.D.   On: 02/27/2023 16:26    ROS  PE Blood pressure 107/72, pulse 65, temperature (!) 97.5 F (36.4 C), temperature source Oral, resp. rate 17, height 6' (1.829 m), weight 70.4 kg, SpO2 100%. Constitutional: NAD; conversant; no deformities Eyes: Moist conjunctiva; no lid lag; anicteric; PERRL Neck: Trachea midline; no thyromegaly Lungs: Normal respiratory effort; no tactile fremitus CV: RRR; no palpable thrills; no pitting edema GI: Abd soft, NT, ND; no palpable hepatosplenomegaly MSK: unable to assess gait; no clubbing/cyanosis Psychiatric: Appropriate affect; alert and oriented x3 Lymphatic: No palpable cervical or axillary lymphadenopathy Skin: No major subcutaneous  nodules. Warm and dry   Assessment/Plan: 77 yo male question of early bowel obstruction and possible ileal mass on noncontrasted CT scan. He is moderately deconditioned and shows evidence of significant weight loss. -allow clears trial today -continue IV anticoagulation -CEA lab test -patient is very concerned about a potential surgery as he does not think he would have an easy recovery and wishes to only undergo surgery if it is life or death.  I reviewed last 24 h vitals and pain scores, last 48 h intake and output, last 24 h labs and trends, and last 24 h imaging results.  This care required high  level of medical decision making.   De Blanch Sparkle Aube 02/28/2023, 9:19 AM

## 2023-03-01 ENCOUNTER — Inpatient Hospital Stay (HOSPITAL_COMMUNITY): Payer: Medicare HMO

## 2023-03-01 DIAGNOSIS — R531 Weakness: Secondary | ICD-10-CM | POA: Diagnosis not present

## 2023-03-01 LAB — URINALYSIS, W/ REFLEX TO CULTURE (INFECTION SUSPECTED)
Bacteria, UA: NONE SEEN
Bilirubin Urine: NEGATIVE
Glucose, UA: NEGATIVE mg/dL
Ketones, ur: 5 mg/dL — AB
Nitrite: NEGATIVE
Protein, ur: 30 mg/dL — AB
Specific Gravity, Urine: 1.01 (ref 1.005–1.030)
pH: 5 (ref 5.0–8.0)

## 2023-03-01 LAB — CBC
HCT: 32.4 % — ABNORMAL LOW (ref 39.0–52.0)
Hemoglobin: 10.9 g/dL — ABNORMAL LOW (ref 13.0–17.0)
MCH: 33.6 pg (ref 26.0–34.0)
MCHC: 33.6 g/dL (ref 30.0–36.0)
MCV: 100 fL (ref 80.0–100.0)
Platelets: 148 10*3/uL — ABNORMAL LOW (ref 150–400)
RBC: 3.24 MIL/uL — ABNORMAL LOW (ref 4.22–5.81)
RDW: 13.6 % (ref 11.5–15.5)
WBC: 4.8 10*3/uL (ref 4.0–10.5)
nRBC: 0 % (ref 0.0–0.2)

## 2023-03-01 LAB — BASIC METABOLIC PANEL
Anion gap: 4 — ABNORMAL LOW (ref 5–15)
BUN: 20 mg/dL (ref 8–23)
CO2: 26 mmol/L (ref 22–32)
Calcium: 9 mg/dL (ref 8.9–10.3)
Chloride: 101 mmol/L (ref 98–111)
Creatinine, Ser: 1.38 mg/dL — ABNORMAL HIGH (ref 0.61–1.24)
GFR, Estimated: 53 mL/min — ABNORMAL LOW (ref >60)
Glucose, Bld: 94 mg/dL (ref 70–99)
Potassium: 4.3 mmol/L (ref 3.5–5.1)
Sodium: 131 mmol/L — ABNORMAL LOW (ref 135–145)

## 2023-03-01 LAB — CEA: CEA: 5 ng/mL — ABNORMAL HIGH (ref 0.0–4.7)

## 2023-03-01 LAB — PROTIME-INR
INR: 2.3 — ABNORMAL HIGH (ref 0.8–1.2)
Prothrombin Time: 25.6 s — ABNORMAL HIGH (ref 11.4–15.2)

## 2023-03-01 LAB — HEPARIN LEVEL (UNFRACTIONATED): Heparin Unfractionated: 0.47 [IU]/mL (ref 0.30–0.70)

## 2023-03-01 LAB — MAGNESIUM: Magnesium: 1.8 mg/dL (ref 1.7–2.4)

## 2023-03-01 LAB — PHOSPHORUS: Phosphorus: 3 mg/dL (ref 2.5–4.6)

## 2023-03-01 MED ORDER — METOPROLOL TARTRATE 5 MG/5ML IV SOLN
5.0000 mg | INTRAVENOUS | Status: DC | PRN
  Administered 2023-03-02 – 2023-03-04 (×5): 5 mg via INTRAVENOUS
  Filled 2023-03-01 (×6): qty 5

## 2023-03-01 MED ORDER — DIPHENHYDRAMINE HCL 25 MG PO CAPS
25.0000 mg | ORAL_CAPSULE | Freq: Four times a day (QID) | ORAL | Status: DC | PRN
  Administered 2023-03-01 – 2023-03-03 (×3): 25 mg via ORAL
  Filled 2023-03-01 (×3): qty 1

## 2023-03-01 MED ORDER — SODIUM CHLORIDE 0.9 % IV SOLN
1.0000 g | INTRAVENOUS | Status: DC
  Administered 2023-03-01: 1 g via INTRAVENOUS
  Filled 2023-03-01: qty 10

## 2023-03-01 MED ORDER — ACETAMINOPHEN 10 MG/ML IV SOLN
1000.0000 mg | Freq: Once | INTRAVENOUS | Status: AC
  Administered 2023-03-01: 1000 mg via INTRAVENOUS
  Filled 2023-03-01: qty 100

## 2023-03-01 MED ORDER — HYDROCORTISONE 0.5 % EX CREA
TOPICAL_CREAM | Freq: Two times a day (BID) | CUTANEOUS | Status: DC | PRN
  Administered 2023-03-02 – 2023-03-03 (×2): 1 via TOPICAL
  Filled 2023-03-01: qty 28.35

## 2023-03-01 NOTE — Plan of Care (Signed)

## 2023-03-01 NOTE — Progress Notes (Signed)
PHARMACY - ANTICOAGULATION CONSULT NOTE  Pharmacy Consult for Warfarin to Heparin Indication: atrial fibrillation  Allergies  Allergen Reactions   Allopurinol Other (See Comments)   Colchicine Other (See Comments)   Duloxetine Other (See Comments)    More nervous   Elemental Sulfur Nausea And Vomiting   Oxycontin [Oxycodone Hcl] Nausea And Vomiting    Patient Measurements: Height: 6' (182.9 cm) Weight: 70.4 kg (155 lb 3.3 oz) IBW/kg (Calculated) : 77.6 Heparin dosing weight: 70.4 kg  Vital Signs: Temp: 98.3 F (36.8 C) (11/04 0312) Temp Source: Oral (11/04 0312) BP: 130/68 (11/04 0312) Pulse Rate: 98 (11/04 0312)  Labs: Recent Labs    02/27/23 0353 02/28/23 0353 02/28/23 1337 02/28/23 2056 03/01/23 0431  HGB 11.7* 11.5*  --   --  10.9*  HCT 34.9* 34.8*  --   --  32.4*  PLT 158 163  --   --  148*  LABPROT 27.3* 22.3*  --   --  25.6*  INR 2.5* 1.9*  --   --  2.3*  HEPARINUNFRC  --   --  <0.10* 0.38 0.47  CREATININE 1.25* 1.42*  --   --  1.38*    Estimated Creatinine Clearance: 46.1 mL/min (A) (by C-G formula based on SCr of 1.38 mg/dL (H)).   Medical History: Past Medical History:  Diagnosis Date   Atrial fibrillation (HCC) 02/08/2013   CHADS-VAS - 2 (AGE, HTN). On anticoagulation. Holter 6/14 - 2.3 sec pause. Avg HR 87. 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 Pt has hx AF and CVA and is on warfarin PTA. Warfarin last dose given 11/2, then held with possible SBO. Pharmacy consulted for heparin once INR <2.  Heparin  started 11/3, INR today up to 2.3 likely due to warfarin dose on 11/2. Will stop heparin for today and follow INR trend tomorrow.   Goal of Therapy:  Heparin level 0.3-0.7 units/mL INR 2-3 Monitor platelets by anticoagulation protocol: Yes   Plan:  Stop heparin Check INR tomorrow  Fredonia Highland, PharmD, BCPS, [facility] Clinical Pharmacist Please check AMION for all [facility] Pharmacy numbers 03/01/2023

## 2023-03-01 NOTE — Progress Notes (Signed)
PROGRESS NOTE    Ralph Sullivan  OZH:[id number] DOB: [birth year] DOA: 02/25/2023 PCP: Lorenda Ishihara, MD  Chief Complaint  Patient presents with   Weakness    Brief Narrative:   77 year old with history of Ralph Sullivan-fib on Coumadin, hyperhidrosis, CKD stage IIIa, CVA comes to the ED for generalized weakness. Patient was brought to the hospital evaluated by therapy and would need placement but hospital course was complicated by Ralph Sullivan-fib with RVR therefore admitted.   Assessment & Plan:   Principal Problem:   Ralph Sullivan-fib (HCC) Active Problems:   Weakness   Atrial fibrillation with RVR (HCC)  Small Bowel Obstruction Small Bowel Lesion Appreciate surgery assistance  NPO  CT abd/pelvis pending  CEA pending  Atrial fibrillation with RVR with history of chronic atrial fibrillation on Coumadin Initially while working with therapy patient went into atrial fibrillation with RVR, heart rate has improved now.   Metoprolol on hold -> will consider resumption pending HR trend IV metop prn  Heparin gtt (on warfarin at home) His last echocardiogram was in January 2024 which showed EF of 45%, mild LVH, enlarged RV and dilated cardiomyopathy.   Seen by outpatient cardiology Dr. Anne Fu in September 2024.  Beta-blocker was reduced due to chronic hyperhidrosis  Bradycardia  Slow Ventricular Response  3 Second Pause Hold clonidine and metoprolol Improved today, if resuming AV nodal blockers will do so cautiously Low threshold for cards c/s   Generalized weakness and deconditioning Attributed to his underlying cardiac condition and age related.  PT/OT.   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  Metoprolol on hold.   CKD stage IIIa Cr around baseline 1.4    DVT prophylaxis: heparin gtt Code Status: full Family Communication: aunts at bedside Disposition:   Status is: Observation The patient  will require care spanning > 2 midnights and should be moved to inpatient because: need for continued inpatient care   Consultants:  surgery  Procedures:  none  Antimicrobials:  Anti-infectives (From admission, onward)    None       Subjective: No new complaints Family at bedside  Objective: Vitals:   02/28/23 2300 03/01/23 0312 03/01/23 0900 03/01/23 1115  BP:  130/68 (!) 121/91 (!) 122/92  Pulse: (!) 57 98 85 85  Resp: 12 11 18  (!) 23  Temp:  98.3 F (36.8 C) 98.3 F (36.8 C) 98 F (36.7 C)  TempSrc:  Oral Oral Oral  SpO2: 98% 98% 97% 100%  Weight:      Height:        Intake/Output Summary (Last 24 hours) at 03/01/2023 1617 Last data filed at 03/01/2023 1400 Gross per 24 hour  Intake 1448.03 ml  Output 1525 ml  Net -76.97 ml   Filed Weights   02/25/23 1235 02/27/23 1500  Weight: 70.4 kg 70.4 kg    Examination:  General: No acute distress. Cardiovascular: irregularly irregular, controlled rate Lungs: unlabored Abdomen: nontender Neurological: Alert and oriented 3. Moves all extremities 4 with equal strength. Cranial nerves II through XII grossly intact. Extremities: No clubbing or cyanosis. No edema.   Data Reviewed: I have personally reviewed following labs and imaging studies  CBC: Recent Labs  Lab 02/25/23 1250 02/27/23 0353 02/28/23 0353 03/01/23 0431  WBC 7.7 6.1 5.9 4.8  HGB 12.1* 11.7* 11.5* 10.9*  HCT 36.2* 34.9* 34.8* 32.4*  MCV 98.4 98.0 98.9 100.0  PLT 181 158 163 148*  Basic Metabolic Panel: Recent Labs  Lab 02/25/23 1250 02/27/23 0353 02/28/23 0353 03/01/23 0431  NA 131* 130* 131* 131*  K 4.4 4.2 4.5 4.3  CL 96* 96* 99 101  CO2 22 23 23 26   GLUCOSE 122* 101* 116* 94  BUN 20 22 22 20   CREATININE 1.48* 1.25* 1.42* 1.38*  CALCIUM 9.9 9.4 9.5 9.0  MG  --  1.7 1.8 1.8  PHOS  --  3.6 3.2 3.0    GFR: Estimated Creatinine Clearance: 46.1 mL/min (Jarris Kortz) (by C-G formula based on SCr of 1.38 mg/dL (H)).  Liver Function  Tests: Recent Labs  Lab 02/25/23 1250 02/27/23 0353  AST 21  --   ALT 13  --   ALKPHOS 66  --   BILITOT 0.9  --   PROT 6.7  --   ALBUMIN 3.8 3.2*    CBG: Recent Labs  Lab 02/25/23 1250  GLUCAP 124*     Recent Results (from the past 240 hour(s))  Resp panel by RT-PCR (RSV, Flu Takyah Ciaramitaro&B, Covid) Anterior Nasal Swab     Status: None   Collection Time: 02/25/23  6:39 PM   Specimen: Anterior Nasal Swab  Result Value Ref Range Status   SARS Coronavirus 2 by RT PCR NEGATIVE NEGATIVE Final   Influenza Laydon Martis by PCR NEGATIVE NEGATIVE Final   Influenza B by PCR NEGATIVE NEGATIVE Final    Comment: (NOTE) The Xpert Xpress SARS-CoV-2/FLU/RSV plus assay is intended as an aid in the diagnosis of influenza from Nasopharyngeal swab specimens and should not be used as Derwood Becraft sole basis for treatment. Nasal washings and aspirates are unacceptable for Xpert Xpress SARS-CoV-2/FLU/RSV testing.  Fact Sheet for Patients: BloggerCourse.com  Fact Sheet for Healthcare Providers: SeriousBroker.it  This test is not yet approved or cleared by the [place] FDA and has been authorized for detection and/or diagnosis of SARS-CoV-2 by FDA under an Emergency Use Authorization (EUA). This EUA will remain in effect (meaning this test can be used) for the duration of the COVID-19 declaration under Section 564(b)(1) of the Act, 21 U.S.C. section 360bbb-3(b)(1), unless the authorization is terminated or revoked.     Resp Syncytial Virus by PCR NEGATIVE NEGATIVE Final    Comment: (NOTE) Fact Sheet for Patients: BloggerCourse.com  Fact Sheet for Healthcare Providers: SeriousBroker.it  This test is not yet approved or cleared by the [place] FDA and has been authorized for detection and/or diagnosis of SARS-CoV-2 by FDA under an Emergency Use Authorization (EUA). This EUA will remain in effect (meaning this  test can be used) for the duration of the COVID-19 declaration under Section 564(b)(1) of the Act, 21 U.S.C. section 360bbb-3(b)(1), unless the authorization is terminated or revoked.  Performed at [facility], [address] [place], [place] [address]          Radiology Studies: CT ABDOMEN PELVIS WO CONTRAST  Result Date: 02/27/2023 CLINICAL DATA:  Abdominal pain, bowel obstruction suspected EXAM: CT ABDOMEN AND PELVIS WITHOUT CONTRAST TECHNIQUE: Multidetector CT imaging of the abdomen and pelvis was performed following the standard protocol without IV contrast. RADIATION DOSE REDUCTION: This exam was performed according to the departmental dose-optimization program which includes automated exposure control, adjustment of the mA and/or kV according to patient size and/or use of iterative reconstruction technique. COMPARISON:  Abdominal radiograph dated 12/28/2022. CT abdomen/pelvis dated 07/16/2016. FINDINGS: Lower chest: Mild scarring in the left lower lobe. Hepatobiliary: Unenhanced liver is unremarkable, noting Lundon Verdejo benign calcified granuloma. Layering tiny gallstones (series 3/image 29),  without associated inflammatory changes. No intrahepatic or extrahepatic ductal dilatation. Pancreas: Within normal limits. Spleen: Within normal limits. Adrenals/Urinary Tract: Adrenal glands are within normal limits. Numerous bilateral renal cysts, measuring up to 3.4 cm in the left lower kidney (series 3/image 41), benign (Bosniak I). No follow-up is recommended. Bilateral renal cortical atrophy. Multiple nonobstructing bilateral renal calculi measuring up to 8 mm in the right upper kidney (series 3/image 33). No ureteral or bladder calculi. No hydronephrosis. Bladder is within normal limits. Stomach/Bowel: Stomach is within normal limits. Dilated small bowel throughout the abdomen. Transition point in distal ileum where there is short segment narrowing (series 3/image 35; coronal image 61). An  underlying small-bowel lesion is possible. Normal appendix (series 3/image 3). Left colonic diverticulosis, without evidence of diverticulitis. Vascular/Lymphatic: No evidence of abdominal aortic aneurysm. Atherosclerotic calcifications of the abdominal aorta and branch vessels. No suspicious abdominopelvic lymphadenopathy. Reproductive: Dystrophic calcifications in the prostate. Other: No abdominopelvic ascites. Tiny fat containing periumbilical hernia (series 3/image 46). Musculoskeletal: Degenerative changes of the visualized thoracolumbar spine. Status post ORIF of the left hip. IMPRESSION: Small bowel obstruction with transition point in the distal ileum. An underlying small bowel lesion is possible. Additional ancillary findings as above. Electronically Signed   By: Charline Bills M.D.   On: 02/27/2023 21:19        Scheduled Meds:  fluticasone  2 spray Each Nare Daily   lidocaine  1 patch Transdermal Q24H   pantoprazole (PROTONIX) IV  40 mg Intravenous Q24H   Continuous Infusions:     LOS: 1 day    Time spent: over 30 min    Lacretia Nicks, MD [facility]   To contact the attending provider between 7A-7P or the covering provider during after hours 7P-7A, please log into the web site [email or web address] and access using universal [place] password for that web site. If you do not have the password, please call the hospital operator.  03/01/2023, 4:17 PM

## 2023-03-01 NOTE — TOC Progression Note (Signed)
Transition of Care [facility]) - Progression Note    Patient Details  Name: Ralph Sullivan MRN: [id number] Date of Birth: Jun 21, 1945  Transition of Care [facility]) CM/SW Contact  Delilah Shan, LCSWA Phone Number: 03/01/2023, 4:31 PM  Clinical Narrative:     CSW provided patient SNF bed offers. CSW informed patient waiting to hear back from [facility] to see if they can offer. Patient informed CSW number one choice is [facility]. CSW informed patient will follow up with him tomorrow on SNF choice once able to follow up with [facility] to see if they can offer SNF bed for patient.   Expected Discharge Plan: Skilled Nursing Facility Barriers to Discharge: Continued Medical Work up  Expected Discharge Plan and Services       Living arrangements for the past 2 months: Single Family Home                                       Social Determinants of Health (SDOH) Interventions SDOH Screenings   Food Insecurity: No Food Insecurity (02/27/2023)  Housing: Low Risk  (02/27/2023)  Transportation Needs: No Transportation Needs (02/27/2023)  Utilities: Not At Risk (02/27/2023)  Tobacco Use: Medium Risk (02/25/2023)    Readmission Risk Interventions     No data to display

## 2023-03-01 NOTE — Progress Notes (Signed)
Subjective: Patient no longer feels bloated.  No nausea.  + flatus, but no BM since Friday.  Overall still sore.  Drank some clear liquids last night only thus far.  No worsening of his symptoms at that time.  ROS: See above, otherwise other systems negative  Objective: Vital signs in last 24 hours: Temp:  [97.3 F (36.3 C)-98.3 F (36.8 C)] 98.3 F (36.8 C) (11/04 0900) Pulse Rate:  [57-98] 85 (11/04 0900) Resp:  [11-20] 18 (11/04 0900) BP: (101-130)/(68-91) 121/91 (11/04 0900) SpO2:  [96 %-100 %] 97 % (11/04 0900) Last BM Date : 02/26/23  Intake/Output from previous day: 11/03 0701 - 11/04 0700 In: 1983 [P.O.:120; I.V.:1863] Out: 675 [Urine:675] Intake/Output this shift: Total I/O In: -  Out: 150 [Urine:150]  PE: Gen: NAD Abd: soft, ND, minimally tender, +BS  Lab Results:  Recent Labs    02/28/23 0353 03/01/23 0431  WBC 5.9 4.8  HGB 11.5* 10.9*  HCT 34.8* 32.4*  PLT 163 148*   BMET Recent Labs    02/28/23 0353 03/01/23 0431  NA 131* 131*  K 4.5 4.3  CL 99 101  CO2 23 26  GLUCOSE 116* 94  BUN 22 20  CREATININE 1.42* 1.38*  CALCIUM 9.5 9.0   PT/INR Recent Labs    02/28/23 0353 03/01/23 0431  LABPROT 22.3* 25.6*  INR 1.9* 2.3*   CMP     Component Value Date/Time   NA 131 (L) 03/01/2023 0431   NA 128 (L) 10/28/2022 0916   K 4.3 03/01/2023 0431   CL 101 03/01/2023 0431   CO2 26 03/01/2023 0431   GLUCOSE 94 03/01/2023 0431   BUN 20 03/01/2023 0431   BUN 24 10/28/2022 0916   CREATININE 1.38 (H) 03/01/2023 0431   CALCIUM 9.0 03/01/2023 0431   PROT 6.7 02/25/2023 1250   ALBUMIN 3.2 (L) 02/27/2023 0353   AST 21 02/25/2023 1250   ALT 13 02/25/2023 1250   ALKPHOS 66 02/25/2023 1250   BILITOT 0.9 02/25/2023 1250   GFRNONAA 53 (L) 03/01/2023 0431   GFRAA 59 (L) 10/19/2017 0824   Lipase  No results found for: "LIPASE"     Studies/Results: CT ABDOMEN PELVIS WO CONTRAST  Result Date: 02/27/2023 CLINICAL DATA:  Abdominal pain,  bowel obstruction suspected EXAM: CT ABDOMEN AND PELVIS WITHOUT CONTRAST TECHNIQUE: Multidetector CT imaging of the abdomen and pelvis was performed following the standard protocol without IV contrast. RADIATION DOSE REDUCTION: This exam was performed according to the departmental dose-optimization program which includes automated exposure control, adjustment of the mA and/or kV according to patient size and/or use of iterative reconstruction technique. COMPARISON:  Abdominal radiograph dated 12/28/2022. CT abdomen/pelvis dated 07/16/2016. FINDINGS: Lower chest: Mild scarring in the left lower lobe. Hepatobiliary: Unenhanced liver is unremarkable, noting a benign calcified granuloma. Layering tiny gallstones (series 3/image 29), without associated inflammatory changes. No intrahepatic or extrahepatic ductal dilatation. Pancreas: Within normal limits. Spleen: Within normal limits. Adrenals/Urinary Tract: Adrenal glands are within normal limits. Numerous bilateral renal cysts, measuring up to 3.4 cm in the left lower kidney (series 3/image 41), benign (Bosniak I). No follow-up is recommended. Bilateral renal cortical atrophy. Multiple nonobstructing bilateral renal calculi measuring up to 8 mm in the right upper kidney (series 3/image 33). No ureteral or bladder calculi. No hydronephrosis. Bladder is within normal limits. Stomach/Bowel: Stomach is within normal limits. Dilated small bowel throughout the abdomen. Transition point in distal ileum where there is short segment narrowing (series 3/image 35;  coronal image 61). An underlying small-bowel lesion is possible. Normal appendix (series 3/image 3). Left colonic diverticulosis, without evidence of diverticulitis. Vascular/Lymphatic: No evidence of abdominal aortic aneurysm. Atherosclerotic calcifications of the abdominal aorta and branch vessels. No suspicious abdominopelvic lymphadenopathy. Reproductive: Dystrophic calcifications in the prostate. Other: No  abdominopelvic ascites. Tiny fat containing periumbilical hernia (series 3/image 46). Musculoskeletal: Degenerative changes of the visualized thoracolumbar spine. Status post ORIF of the left hip. IMPRESSION: Small bowel obstruction with transition point in the distal ileum. An underlying small bowel lesion is possible. Additional ancillary findings as above. Electronically Signed   By: Charline Bills M.D.   On: 02/27/2023 21:19   DG Abd 1 View  Result Date: 02/27/2023 CLINICAL DATA:  Abdominal pain. EXAM: ABDOMEN - 1 VIEW COMPARISON:  Abdominopelvic CT 07/16/2016. FINDINGS: 1600 hours. Single portable supine view of the abdomen demonstrates mildly dilated small bowel loops in the central abdomen, asymmetric to the right. The stomach appears mildly distended. No significant colonic distention identified. There is no supine evidence of pneumoperitoneum. Previous CT demonstrated a left inguinal hernia which is not visualized on this examination. Possible right renal and bladder calculi. Severe lumbar spondylosis associated with a convex left scoliosis. IMPRESSION: Mildly dilated small bowel loops in the central abdomen, asymmetric to the right, suboptimally evaluated on this single supine examination. Findings could be secondary to an ileus versus early or partial small bowel obstruction. Correlate clinically. Consider further evaluation with follow-up abdominopelvic CT. Electronically Signed   By: Carey Bullocks M.D.   On: 02/27/2023 16:26    Anti-infectives: Anti-infectives (From admission, onward)    None        Assessment/Plan pSBO, unclear etiology - cont CLD for right now - will d/w MD regarding maybe repeating a CT scan with oral contrast to eval this area of narrowing that they are seeing on his scan.  He has never had a colonoscopy.  He has only had a lap IHR which is not intra-abdominal and a urologic procedure for stones, which may not have been intra-abdominal either, but unclear  since he doesn't know exactly what they did.  It is possible he has a virgin abdomen still, so adhesive disease would be less likely etiology than if he had had prior abdominal surgery. - INR still 2.3 today , cont to hold coumadin and allow to trend down, no emergent needs for surgery so no need to actively reverse. -cont to monitor   FEN - CLD VTE - coumadin on hold ID - none currently needed  A fib CKD Gout H/o CVA  I reviewed hospitalist notes, last 24 h vitals and pain scores, last 48 h intake and output, last 24 h labs and trends, and last 24 h imaging results.   LOS: 2 days    Letha Cape , [facility] 03/01/2023, 9:49 AM Please see Amion for pager number during day hours 7:00am-4:30pm or 7:00am -11:30am on weekends

## 2023-03-01 NOTE — Progress Notes (Signed)
Physical Therapy Treatment Patient Details Name: Ralph Sullivan MRN: [id number] DOB: 1945-10-29 Today's Date: 03/01/2023   History of Present Illness The pt is a 77 yo male presenting 10/31 with generalized weakness in BLE. PMH includes: atrial fibrillation on metoprolol and warfarin, hyperhidrosis of the hands, and CKD.    PT Comments  Pt met in bed with reports of being severely cold, having shakes, and being in general pain. His HR spiked from 94 to 142 after a few straight leg raises in bed. He only tolerated a handful of ankle pumps before saying his ankle had increased pain. Therapist terminated further mobility and informed nursing staff. Therapist helped reposition pt in bed.     If plan is discharge home, recommend the following: A lot of help with walking and/or transfers;A lot of help with bathing/dressing/bathroom;Assistance with cooking/housework;Direct supervision/assist for medications management;Direct supervision/assist for financial management;Assist for transportation;Help with stairs or ramp for entrance   Can travel by private vehicle     No  Equipment Recommendations  None recommended by PT    Recommendations for Other Services       Precautions / Restrictions Precautions Precautions: Fall Precaution Comments: watch HR Restrictions Weight Bearing Restrictions: No     Mobility  Bed Mobility Overal bed mobility: Needs Assistance             General bed mobility comments: maxA for repositioning today as HR spiked dramatically during LE exercise    Transfers                        Ambulation/Gait                   Stairs             Wheelchair Mobility     Tilt Bed    Modified Rankin (Stroke Patients Only)       Balance                                            Cognition Arousal: Alert Behavior During Therapy: WFL for tasks assessed/performed Overall Cognitive Status: Within Functional Limits  for tasks assessed                                          Exercises General Exercises - Lower Extremity Ankle Circles/Pumps: AROM, Both, 10 reps Straight Leg Raises: AROM, Both, 5 reps    General Comments General comments (skin integrity, edema, etc.): HR to 142 with simple LE exercise in supine, therapist terminated further mobility. maxA to reposition pt in bed to limit further spike. Informed nursing.      Pertinent Vitals/Pain Pain Assessment Pain Assessment: Faces Faces Pain Scale: Hurts even more Pain Location: low back Pain Descriptors / Indicators: Constant Pain Intervention(s): Limited activity within patient's tolerance, Monitored during session    Home Living                          Prior Function            PT Goals (current goals can now be found in the care plan section) Acute Rehab PT Goals PT Goal Formulation: With patient Time For Goal Achievement: 03/12/23 Potential to Achieve Goals:  Good Progress towards PT goals: PT to reassess next treatment    Frequency    Min 1X/week      PT Plan      Co-evaluation              AM-PAC PT "6 Clicks" Mobility   Outcome Measure  Help needed turning from your back to your side while in a flat bed without using bedrails?: Total Help needed moving from lying on your back to sitting on the side of a flat bed without using bedrails?: Total Help needed moving to and from a bed to a chair (including a wheelchair)?: Total Help needed standing up from a chair using your arms (e.g., wheelchair or bedside chair)?: Total Help needed to walk in hospital room?: Total Help needed climbing 3-5 steps with a railing? : Total 6 Click Score: 6    End of Session   Activity Tolerance: Treatment limited secondary to medical complications (Comment) Patient left: in bed;with call bell/phone within reach Nurse Communication: Other (comment);Mobility status (HR) PT Visit Diagnosis:  Unsteadiness on feet (R26.81);Other abnormalities of gait and mobility (R26.89);Muscle weakness (generalized) (M62.81)     Time: 9604-5409 PT Time Calculation (min) (ACUTE ONLY): 15 min  Charges:    $Therapeutic Exercise: 8-22 mins PT General Charges $$ ACUTE PT VISIT: 1 Visit                     Andrey Farmer SPT Secure chat preferred\   Darlin Drop 03/01/2023, 4:16 PM

## 2023-03-02 DIAGNOSIS — R7881 Bacteremia: Secondary | ICD-10-CM | POA: Diagnosis not present

## 2023-03-02 DIAGNOSIS — B952 Enterococcus as the cause of diseases classified elsewhere: Secondary | ICD-10-CM | POA: Diagnosis not present

## 2023-03-02 LAB — BLOOD CULTURE ID PANEL (REFLEXED) - BCID2
A.calcoaceticus-baumannii: NOT DETECTED
Bacteroides fragilis: NOT DETECTED
Candida albicans: NOT DETECTED
Candida auris: NOT DETECTED
Candida glabrata: NOT DETECTED
Candida krusei: NOT DETECTED
Candida parapsilosis: NOT DETECTED
Candida tropicalis: NOT DETECTED
Cryptococcus neoformans/gattii: NOT DETECTED
Enterobacter cloacae complex: NOT DETECTED
Enterobacterales: NOT DETECTED
Enterococcus Faecium: NOT DETECTED
Enterococcus faecalis: DETECTED — AB
Escherichia coli: NOT DETECTED
Haemophilus influenzae: NOT DETECTED
Klebsiella aerogenes: NOT DETECTED
Klebsiella oxytoca: NOT DETECTED
Klebsiella pneumoniae: NOT DETECTED
Listeria monocytogenes: NOT DETECTED
Methicillin resistance mecA/C: NOT DETECTED
Neisseria meningitidis: NOT DETECTED
Proteus species: NOT DETECTED
Pseudomonas aeruginosa: NOT DETECTED
Salmonella species: NOT DETECTED
Serratia marcescens: NOT DETECTED
Staphylococcus aureus (BCID): NOT DETECTED
Staphylococcus epidermidis: DETECTED — AB
Staphylococcus lugdunensis: NOT DETECTED
Staphylococcus species: DETECTED — AB
Stenotrophomonas maltophilia: NOT DETECTED
Streptococcus agalactiae: NOT DETECTED
Streptococcus pneumoniae: NOT DETECTED
Streptococcus pyogenes: NOT DETECTED
Streptococcus species: NOT DETECTED
Vancomycin resistance: NOT DETECTED

## 2023-03-02 LAB — PROTIME-INR
INR: 2.7 — ABNORMAL HIGH (ref 0.8–1.2)
Prothrombin Time: 28.7 s — ABNORMAL HIGH (ref 11.4–15.2)

## 2023-03-02 MED ORDER — SODIUM CHLORIDE 0.9 % IV SOLN: 2.0000 g | INTRAVENOUS | Status: DC

## 2023-03-02 MED ORDER — METOPROLOL TARTRATE 12.5 MG HALF TABLET
12.5000 mg | ORAL_TABLET | Freq: Two times a day (BID) | ORAL | Status: DC
  Administered 2023-03-02 (×2): 12.5 mg via ORAL
  Filled 2023-03-02 (×2): qty 1

## 2023-03-02 MED ORDER — POLYETHYLENE GLYCOL 3350 17 G PO PACK
17.0000 g | PACK | Freq: Every day | ORAL | Status: DC | PRN
  Administered 2023-03-04 – 2023-03-06 (×3): 17 g via ORAL
  Filled 2023-03-02 (×3): qty 1

## 2023-03-02 MED ORDER — WARFARIN - PHARMACIST DOSING INPATIENT: Freq: Every day | Status: DC

## 2023-03-02 MED ORDER — SODIUM CHLORIDE 0.9 % IV SOLN
2.0000 g | Freq: Four times a day (QID) | INTRAVENOUS | Status: DC
  Administered 2023-03-02 – 2023-03-08 (×25): 2 g via INTRAVENOUS
  Filled 2023-03-02 (×26): qty 2000

## 2023-03-02 MED ORDER — WARFARIN SODIUM 2.5 MG PO TABS
2.5000 mg | ORAL_TABLET | Freq: Once | ORAL | Status: AC
  Administered 2023-03-02: 2.5 mg via ORAL
  Filled 2023-03-02: qty 1

## 2023-03-02 NOTE — Progress Notes (Addendum)
PROGRESS NOTE    Ralph Sullivan  WUJ:[id number] DOB: 1945-06-06 DOA: 02/25/2023 PCP: Ralph Ishihara, MD  Chief Complaint  Patient presents with   Weakness    Brief Narrative:   77 year old with history of Ralph Sullivan-fib on Coumadin, hyperhidrosis, CKD stage IIIa, CVA comes to the ED for generalized weakness. Patient was brought to the hospital evaluated by therapy and would need placement but hospital course was complicated by Ralph Sullivan-fib with RVR therefore admitted.   Assessment & Plan:   Principal Problem:   Ralph Sullivan-fib (HCC) Active Problems:   Weakness   Atrial fibrillation with RVR (HCC)  Ralph Sullivan Bacteremia Fever  Rigors Fever + shivering on 11/4 Blood cultures with gram positive cocci in pairs and chains (BCID with e. Sullivan and staph epidermidis) Repeat blood cultures 11/6 Follow urine culture Echo pending Ampicillin per discussion with ID pharmacy Staph epi may be contaminant, will follow  ID has been consulted, appreciate assistance  Mild L Hydroureteronephrosis Renal function stable, follow urine culture, would have low threshold for discussion with urology  Consider repeat renal US  Small Bowel Obstruction Small Bowel Lesion Appreciate surgery assistance  FLD per surgery, appreciate assistance CT abd/pelvis with resolving partial distal SBO, oral contrast transits to the cecum, previously described transition point in the R abdomen is no longer discretely visualized (see report)  CEA 5  Atrial fibrillation with RVR with history of chronic atrial fibrillation on Coumadin Initially while working with therapy patient went into atrial fibrillation with RVR, heart rate has improved now.   Mild tachy today, follow with PO metop IV metop prn  warfarin His last echocardiogram was in January 2024 which showed EF of 45%, mild LVH, enlarged RV and dilated cardiomyopathy.   Seen by outpatient cardiology Dr. Anne Sullivan in September 2024.  Beta-blocker was reduced due to  chronic hyperhidrosis  Bradycardia  Slow Ventricular Response  3 Second Pause Hold clonidine Improved today, if resuming AV nodal blockers will do so cautiously (metoprolol has been resumed) Low threshold for cards c/s   Generalized weakness and deconditioning Attributed to his underlying cardiac condition and age related.  PT/OT.   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 Watch for rebound hypertension.  Metoprolol has been resumed   CKD stage IIIa Cr around baseline 1.4    DVT prophylaxis: warfarin per pharmacy Code Status: full Family Communication: aunts at bedside Disposition:   Status is: Observation The patient will require care spanning > 2 midnights and should be moved to inpatient because: need for continued inpatient care   Consultants:  surgery  Procedures:  none  Antimicrobials:  Anti-infectives (From admission, onward)    Start     Dose/Rate Route Frequency Ordered Stop   03/02/23 1230  ampicillin (OMNIPEN) 2 g in sodium chloride 0.9 % 100 mL IVPB  Status:  Discontinued        2 g 300 mL/hr over 20 Minutes Intravenous Every 4 hours 03/02/23 1133 03/02/23 1139   03/02/23 1230  ampicillin (OMNIPEN) 2 g in sodium chloride 0.9 % 100 mL IVPB        2 g 300 mL/hr over 20 Minutes Intravenous Every 6 hours 03/02/23 1140     03/02/23 0000  cefTRIAXone (ROCEPHIN) 1 g in sodium chloride 0.9 % 100 mL IVPB  Status:  Discontinued       Note to Pharmacy: Start after urine culture collected   1 g 200  mL/hr over 30 Minutes Intravenous Every 24 hours 03/01/23 2312 03/02/23 1133       Subjective: No complaints  Objective: Vitals:   03/02/23 0400 03/02/23 0427 03/02/23 0945 03/02/23 1200  BP:    (!) 175/120  Pulse:  82  100  Resp:  15  18  Temp:   98.4 F (36.9 C) 98.4 F (36.9 C)  TempSrc:   Oral Oral  SpO2: 94% 93%    Weight:      Height:         Intake/Output Summary (Last 24 hours) at 03/02/2023 1924 Last data filed at 03/02/2023 1801 Gross per 24 hour  Intake 320 ml  Output 3128 ml  Net -2808 ml   Filed Weights   02/25/23 1235 02/27/23 1500  Weight: 70.4 kg 70.4 kg    Examination:  General: No acute distress. Cardiovascular: tachy, irregularly irregular Lungs: unlabored Abdomen: Soft, nontender, nondistended with normal active bowel sounds. No masses. No hepatosplenomegaly. Neurological: Alert and oriented 3. Moves all extremities 4 with equal strength. Cranial nerves II through XII grossly intact. Extremities: No clubbing or cyanosis. No edema.  Data Reviewed: I have personally reviewed following labs and imaging studies  CBC: Recent Labs  Lab 02/25/23 1250 02/27/23 0353 02/28/23 0353 03/01/23 0431  WBC 7.7 6.1 5.9 4.8  HGB 12.1* 11.7* 11.5* 10.9*  HCT 36.2* 34.9* 34.8* 32.4*  MCV 98.4 98.0 98.9 100.0  PLT 181 158 163 148*    Basic Metabolic Panel: Recent Labs  Lab 02/25/23 1250 02/27/23 0353 02/28/23 0353 03/01/23 0431  NA 131* 130* 131* 131*  K 4.4 4.2 4.5 4.3  CL 96* 96* 99 101  CO2 22 23 23 26   GLUCOSE 122* 101* 116* 94  BUN 20 22 22 20   CREATININE 1.48* 1.25* 1.42* 1.38*  CALCIUM 9.9 9.4 9.5 9.0  MG  --  1.7 1.8 1.8  PHOS  --  3.6 3.2 3.0    GFR: Estimated Creatinine Clearance: 46.1 mL/min (Ralph Sullivan) (by C-G formula based on SCr of 1.38 mg/dL (H)).  Liver Function Tests: Recent Labs  Lab 02/25/23 1250 02/27/23 0353  AST 21  --   ALT 13  --   ALKPHOS 66  --   BILITOT 0.9  --   PROT 6.7  --   ALBUMIN 3.8 3.2*    CBG: Recent Labs  Lab 02/25/23 1250  GLUCAP 124*     Recent Results (from the past 240 hour(s))  Resp panel by RT-PCR (RSV, Flu Ralph Sullivan&Ralph Sullivan, Covid) Anterior Nasal Swab     Status: None   Collection Time: 02/25/23  6:39 PM   Specimen: Anterior Nasal Swab  Result Value Ref Range Status   SARS Coronavirus 2 by RT PCR NEGATIVE NEGATIVE Final   Influenza Ralph Sullivan by PCR NEGATIVE  NEGATIVE Final   Influenza Ralph Sullivan by PCR NEGATIVE NEGATIVE Final    Comment: (NOTE) The Ralph Sullivan plus assay is intended as an aid in the diagnosis of influenza from Nasopharyngeal swab specimens and should not be used as Ralph Sullivan sole basis for treatment. Nasal washings and aspirates are unacceptable for Ralph Sullivan testing.  Fact Sheet for Patients: BloggerCourse.com  Fact Sheet for Healthcare Providers: SeriousBroker.it  This test is not yet approved or cleared by the [place] FDA and has been authorized for detection and/or diagnosis of SARS-CoV-2 by FDA under an Emergency Use Authorization (EUA). This EUA will remain in effect (meaning this test can be used) for the duration of the COVID-19 declaration  under Section 564(Ralph Sullivan)(1) of the Act, 21 U.S.C. section 360bbb-3(Ralph Sullivan)(1), unless the authorization is terminated or revoked.     Resp Syncytial Virus by PCR NEGATIVE NEGATIVE Final    Comment: (NOTE) Fact Sheet for Patients: BloggerCourse.com  Fact Sheet for Healthcare Providers: SeriousBroker.it  This test is not yet approved or cleared by the [place] FDA and has been authorized for detection and/or diagnosis of SARS-CoV-2 by FDA under an Emergency Use Authorization (EUA). This EUA will remain in effect (meaning this test can be used) for the duration of the COVID-19 declaration under Section 564(Ralph Sullivan)(1) of the Act, 21 U.S.C. section 360bbb-3(Ralph Sullivan)(1), unless the authorization is terminated or revoked.  Performed at [facility], [address], [place], [place] [address]   Culture, blood (Routine X 2) w Reflex to ID Panel     Status: None (Preliminary result)   Collection Time: 03/01/23  5:58 PM   Specimen: BLOOD  Result Value Ref Range Status   Specimen Description BLOOD LEFT ANTECUBITAL  Final   Special Requests   Final     BOTTLES DRAWN AEROBIC AND ANAEROBIC Blood Culture adequate volume   Culture  Setup Time   Final    GRAM POSITIVE COCCI IN PAIRS AND CHAINS IN BOTH AEROBIC AND ANAEROBIC BOTTLES CRITICAL RESULT CALLED TO, READ BACK BY AND VERIFIED WITH: PHARMD J.FRENS AT 1125 ON 03/02/2023 BY T.SAAD. Performed at [facility], [address] [place], [place] [address]    Culture GRAM POSITIVE COCCI  Final   Report Status PENDING  Incomplete  Blood Culture ID Panel (Reflexed)     Status: Abnormal   Collection Time: 03/01/23  5:58 PM  Result Value Ref Range Status   Ralph Sullivan DETECTED (Kaliq Lege) NOT DETECTED Final    Comment: CRITICAL RESULT CALLED TO, READ BACK BY AND VERIFIED WITH: PHARMD J.FRENS AT 1125 ON 03/02/2023 BY T.SAAD.    Ralph Faecium NOT DETECTED NOT DETECTED Final   Listeria monocytogenes NOT DETECTED NOT DETECTED Final   Staphylococcus species DETECTED (Ebany Bowermaster) NOT DETECTED Final    Comment: CRITICAL RESULT CALLED TO, READ BACK BY AND VERIFIED WITH: PHARMD J.FRENS AT 1125 ON 03/02/2023 BY T.SAAD.    Staphylococcus aureus (BCID) NOT DETECTED NOT DETECTED Final   Staphylococcus epidermidis DETECTED (Makayah Pauli) NOT DETECTED Final    Comment: CRITICAL RESULT CALLED TO, READ BACK BY AND VERIFIED WITH: PHARMD J.FRENS AT 1125 ON 03/02/2023 BY T.SAAD.    Staphylococcus lugdunensis NOT DETECTED NOT DETECTED Final   Streptococcus species NOT DETECTED NOT DETECTED Final   Streptococcus agalactiae NOT DETECTED NOT DETECTED Final   Streptococcus pneumoniae NOT DETECTED NOT DETECTED Final   Streptococcus pyogenes NOT DETECTED NOT DETECTED Final   Anaysha Andre.calcoaceticus-baumannii NOT DETECTED NOT DETECTED Final   Bacteroides fragilis NOT DETECTED NOT DETECTED Final   Enterobacterales NOT DETECTED NOT DETECTED Final   Enterobacter cloacae complex NOT DETECTED NOT DETECTED Final   Escherichia coli NOT DETECTED NOT DETECTED Final   Klebsiella aerogenes NOT DETECTED NOT DETECTED Final   Klebsiella  oxytoca NOT DETECTED NOT DETECTED Final   Klebsiella pneumoniae NOT DETECTED NOT DETECTED Final   Proteus species NOT DETECTED NOT DETECTED Final   Salmonella species NOT DETECTED NOT DETECTED Final   Serratia marcescens NOT DETECTED NOT DETECTED Final   Haemophilus influenzae NOT DETECTED NOT DETECTED Final   Neisseria meningitidis NOT DETECTED NOT DETECTED Final   Pseudomonas aeruginosa NOT DETECTED NOT DETECTED Final   Stenotrophomonas maltophilia NOT DETECTED NOT DETECTED Final   Candida  albicans NOT DETECTED NOT DETECTED Final   Candida auris NOT DETECTED NOT DETECTED Final   Candida glabrata NOT DETECTED NOT DETECTED Final   Candida krusei NOT DETECTED NOT DETECTED Final   Candida parapsilosis NOT DETECTED NOT DETECTED Final   Candida tropicalis NOT DETECTED NOT DETECTED Final   Cryptococcus neoformans/gattii NOT DETECTED NOT DETECTED Final   Methicillin resistance mecA/C NOT DETECTED NOT DETECTED Final   Vancomycin resistance NOT DETECTED NOT DETECTED Final    Comment: Performed at [facility], [address], [place], [place] [address]  Culture, blood (Routine X 2) w Reflex to ID Panel     Status: None (Preliminary result)   Collection Time: 03/01/23  6:07 PM   Specimen: BLOOD LEFT ARM  Result Value Ref Range Status   Specimen Description BLOOD LEFT ARM  Final   Special Requests   Final    BOTTLES DRAWN AEROBIC AND ANAEROBIC Blood Culture adequate volume   Culture  Setup Time   Final    GRAM POSITIVE COCCI IN CHAINS IN BOTH AEROBIC AND ANAEROBIC BOTTLES CRITICAL VALUE NOTED.  VALUE IS CONSISTENT WITH PREVIOUSLY REPORTED AND CALLED VALUE. Performed at [facility], [address] [place], [place] [address]    Culture GRAM POSITIVE COCCI  Final   Report Status PENDING  Incomplete         Radiology Studies: DG CHEST PORT 1 VIEW  Result Date: 03/01/2023 CLINICAL DATA:  Fever EXAM: PORTABLE CHEST 1 VIEW COMPARISON:  02/25/2023 FINDINGS: The heart size and  mediastinal contours are within normal limits. Both lungs are clear. The visualized skeletal structures are unremarkable. IMPRESSION: No active disease. Electronically Signed   By: Helyn Numbers M.D.   On: 03/01/2023 21:55   CT ABDOMEN PELVIS WO CONTRAST  Result Date: 03/01/2023 CLINICAL DATA:  Inpatient. Follow-up small bowel obstruction. Acute abdominal pain. EXAM: CT ABDOMEN AND PELVIS WITHOUT CONTRAST TECHNIQUE: Multidetector CT imaging of the abdomen and pelvis was performed following the standard protocol without IV contrast. RADIATION DOSE REDUCTION: This exam was performed according to the departmental dose-optimization program which includes automated exposure control, adjustment of the mA and/or kV according to patient size and/or use of iterative reconstruction technique. COMPARISON:  02/27/2023 CT abdomen/pelvis. FINDINGS: Portions of the upper abdomen including superior liver, spleen and stomach are inadvertently excluded from the study. Lower chest: No acute abnormality. Hepatobiliary: Normal liver size. No liver mass. Cholelithiasis. No gallbladder distention, gallbladder wall thickening or pericholecystic fluid. No biliary ductal dilatation. Pancreas: Normal, with no mass or duct dilation. Spleen: Normal size. No mass. Adrenals/Urinary Tract: Normal adrenals. Nonobstructing 7 mm and 2 mm mid to upper right renal stones. No right hydronephrosis. New mild left hydroureteronephrosis to the level of the upper left pelvic ureter. Driana Dazey few tiny nonobstructing left renal stones, largest 2 mm in the upper left kidney. Numerous small simple bilateral renal cysts and subcentimeter hypodense bilateral renal cortical lesions that are too small to characterize, largest 3.0 cm in the lower left kidney, for which no follow-up imaging is recommended. Normal caliber right ureter. No ureteral stones. Chronic mild diffuse bladder wall thickening. No bladder stones. Stomach/Bowel: Normal non-distended stomach. Oral  contrast transits to the cecum. Normal appendix with oral contrast within the lumen of the appendix. Makya Yurko few persistent mildly dilated mid to distal small bowel loops up to 3.4 cm diameter, improved. Previously described transition point in the right abdomen is no longer discretely visualized. No definite small bowel wall thickening. No pneumatosis. Mild  sigmoid diverticulosis with no large bowel wall thickening or significant pericolonic fat stranding. Vascular/Lymphatic: Atherosclerotic nonaneurysmal abdominal aorta. No pathologically enlarged lymph nodes in the abdomen or pelvis. Reproductive: Normal size prostate. No pneumoperitoneum. Trace deep pelvic ascites is similar. No focal fluid collection. Other: No pneumoperitoneum, ascites or focal fluid collection. Musculoskeletal: No aggressive appearing focal osseous lesions. Marked multilevel lumbar degenerative disc disease. Partially visualized fixation hardware in the proximal left femur. IMPRESSION: 1. Resolving partial distal small bowel obstruction. Oral contrast transits to the cecum. Previously described transition point in the right abdomen is no longer discretely visualized. Mildly dilated small bowel loops are improved. 2. Nonobstructing bilateral nephrolithiasis. New mild left hydroureteronephrosis to the level of the upper left pelvic ureter, with no ureteral or bladder stones. Chronic mild diffuse bladder wall thickening. 3. Cholelithiasis. 4. Mild sigmoid diverticulosis. 5.  Aortic Atherosclerosis (ICD10-I70.0). Electronically Signed   By: Delbert Phenix M.D.   On: 03/01/2023 18:18        Scheduled Meds:  fluticasone  2 spray Each Nare Daily   lidocaine  1 patch Transdermal Q24H   metoprolol tartrate  12.5 mg Oral BID   pantoprazole (PROTONIX) IV  40 mg Intravenous Q24H   Warfarin - Pharmacist Dosing Inpatient   Does not apply q1600   Continuous Infusions:  ampicillin (OMNIPEN) IV 2 g (03/02/23 1710)      LOS: 2 days    Time spent:  over 30 min    Lacretia Nicks, MD [facility]   To contact the attending provider between 7A-7P or the covering provider during after hours 7P-7A, please log into the web site [email or web address] and access using universal [place] password for that web site. If you do not have the password, please call the hospital operator.  03/02/2023, 7:24 PM

## 2023-03-02 NOTE — Progress Notes (Signed)
Occupational Therapy Treatment Patient Details Name: Ralph Sullivan MRN: [id number] DOB: 1945-05-19 Today's Date: 03/02/2023   History of present illness The pt is a 77 yo male presenting 10/31 with generalized weakness in BLE. PMH includes: atrial fibrillation on metoprolol and warfarin, hyperhidrosis of the hands, and CKD.   OT comments  Patient limited to EOB this date as HR and BP continue to run high.  HR up to 121 with EOB sitting during grooming task.  BP initially 175/120, but reduced to 145/99 at the conclusion of session with patient supine in bed.  Patient continues to participate, and Patient will benefit from continued inpatient follow up therapy, <3 hours/day.  OT will continue efforts in the acute setting to address deficits.         If plan is discharge home, recommend the following:  Assist for transportation;Assistance with cooking/housework;A lot of help with bathing/dressing/bathroom;A lot of help with walking and/or transfers   Equipment Recommendations       Recommendations for Other Services      Precautions / Restrictions Precautions Precautions: Fall Precaution Comments: watch HR and BP Restrictions Weight Bearing Restrictions: No       Mobility Bed Mobility Overal bed mobility: Needs Assistance Bed Mobility: Supine to Sit, Sit to Supine     Supine to sit: Min assist, HOB elevated Sit to supine: Min assist        Transfers                         Balance Overall balance assessment: Needs assistance Sitting-balance support: No upper extremity supported, Feet supported Sitting balance-Leahy Scale: Fair                                     ADL either performed or assessed with clinical judgement   ADL       Grooming: Wash/dry hands;Wash/dry face;Supervision/safety;Sitting           Upper Body Dressing : Minimal assistance;Moderate assistance;Sitting                          Extremity/Trunk  Assessment Upper Extremity Assessment RUE Deficits / Details: 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   Lower Extremity Assessment Lower Extremity Assessment: Defer to PT evaluation   Cervical / Trunk Assessment Cervical / Trunk Assessment: Kyphotic    Vision Baseline Vision/History: 1 Wears glasses Patient Visual Report: No change from baseline     Perception Perception Perception: Not tested   Praxis Praxis Praxis: Not tested    Cognition Arousal: Alert Behavior During Therapy: WFL for tasks assessed/performed Overall Cognitive Status: Within Functional Limits for tasks assessed                                          Exercises      Shoulder Instructions       General Comments      Pertinent Vitals/ Pain       Pain Assessment Pain Assessment: Faces Faces Pain Scale: Hurts even more Pain Location: low back Pain Descriptors / Indicators: Constant, Aching Pain Intervention(s): Monitored during session  Frequency  Min 1X/week        Progress Toward Goals  OT Goals(current goals can now be found in the care plan section)  Progress towards OT goals: Progressing toward goals  Acute Rehab OT Goals OT Goal Formulation: With patient Time For Goal Achievement: 03/12/23 Potential to Achieve Goals: Good  Plan      Co-evaluation                 AM-PAC OT "6 Clicks" Daily Activity     Outcome Measure   Help from another person eating meals?: None Help from another person taking care of personal grooming?: A Little Help from another person toileting, which includes using toliet, bedpan, or urinal?: A Lot Help from another person bathing (including washing, rinsing, drying)?: A Lot Help from another person to put on and taking off regular upper body clothing?: A Little Help from another person to put on and taking off regular lower body clothing?:  A Lot 6 Click Score: 16    End of Session    OT Visit Diagnosis: Unsteadiness on feet (R26.81);Muscle weakness (generalized) (M62.81);History of falling (Z91.81)   Activity Tolerance Patient tolerated treatment well   Patient Left in bed;with call bell/phone within reach;with nursing/sitter in room   Nurse Communication Mobility status        Time: 5409-8119 OT Time Calculation (min): 13 min  Charges: OT General Charges $OT Visit: 1 Visit OT Treatments $Self Care/Home Management : 8-22 mins  03/02/2023  RP, OTR/L  Acute Rehabilitation Services  Office:  [phone]   Suzanna Obey 03/02/2023, 2:08 PM

## 2023-03-02 NOTE — Progress Notes (Signed)
Subjective: Patient passing a lot of flatus.  No BM.  No nausea.  Abdominal pain improved.  CT and findings from overnight reviewed.  ROS: See above, otherwise other systems negative  Objective: Vital signs in last 24 hours: Temp:  [98 F (36.7 C)-100.9 F (38.3 C)] 98 F (36.7 C) (11/05 0346) Pulse Rate:  [82-105] 82 (11/05 0427) Resp:  [15-23] 15 (11/05 0427) BP: (122-145)/(89-99) 145/89 (11/05 0347) SpO2:  [93 %-100 %] 93 % (11/05 0427) Last BM Date : 03/01/23  Intake/Output from previous day: 11/04 0701 - 11/05 0700 In: 320 [P.O.:120; IV Piggyback:200] Out: 2450 [Urine:2450] Intake/Output this shift: Total I/O In: -  Out: 528 [Urine:528]  PE: Gen: NAD, getting cleaned up Abd: soft, ND, NT, +BS  Lab Results:  Recent Labs    02/28/23 0353 03/01/23 0431  WBC 5.9 4.8  HGB 11.5* 10.9*  HCT 34.8* 32.4*  PLT 163 148*   BMET Recent Labs    02/28/23 0353 03/01/23 0431  NA 131* 131*  K 4.5 4.3  CL 99 101  CO2 23 26  GLUCOSE 116* 94  BUN 22 20  CREATININE 1.42* 1.38*  CALCIUM 9.5 9.0   PT/INR Recent Labs    03/01/23 0431 03/02/23 0725  LABPROT 25.6* 28.7*  INR 2.3* 2.7*   CMP     Component Value Date/Time   NA 131 (L) 03/01/2023 0431   NA 128 (L) 10/28/2022 0916   K 4.3 03/01/2023 0431   CL 101 03/01/2023 0431   CO2 26 03/01/2023 0431   GLUCOSE 94 03/01/2023 0431   BUN 20 03/01/2023 0431   BUN 24 10/28/2022 0916   CREATININE 1.38 (H) 03/01/2023 0431   CALCIUM 9.0 03/01/2023 0431   PROT 6.7 02/25/2023 1250   ALBUMIN 3.2 (L) 02/27/2023 0353   AST 21 02/25/2023 1250   ALT 13 02/25/2023 1250   ALKPHOS 66 02/25/2023 1250   BILITOT 0.9 02/25/2023 1250   GFRNONAA 53 (L) 03/01/2023 0431   GFRAA 59 (L) 10/19/2017 0824   Lipase  No results found for: "LIPASE"     Studies/Results: DG CHEST PORT 1 VIEW  Result Date: 03/01/2023 CLINICAL DATA:  Fever EXAM: PORTABLE CHEST 1 VIEW COMPARISON:  02/25/2023 FINDINGS: The heart size and  mediastinal contours are within normal limits. Both lungs are clear. The visualized skeletal structures are unremarkable. IMPRESSION: No active disease. Electronically Signed   By: Helyn Numbers M.D.   On: 03/01/2023 21:55   CT ABDOMEN PELVIS WO CONTRAST  Result Date: 03/01/2023 CLINICAL DATA:  Inpatient. Follow-up small bowel obstruction. Acute abdominal pain. EXAM: CT ABDOMEN AND PELVIS WITHOUT CONTRAST TECHNIQUE: Multidetector CT imaging of the abdomen and pelvis was performed following the standard protocol without IV contrast. RADIATION DOSE REDUCTION: This exam was performed according to the departmental dose-optimization program which includes automated exposure control, adjustment of the mA and/or kV according to patient size and/or use of iterative reconstruction technique. COMPARISON:  02/27/2023 CT abdomen/pelvis. FINDINGS: Portions of the upper abdomen including superior liver, spleen and stomach are inadvertently excluded from the study. Lower chest: No acute abnormality. Hepatobiliary: Normal liver size. No liver mass. Cholelithiasis. No gallbladder distention, gallbladder wall thickening or pericholecystic fluid. No biliary ductal dilatation. Pancreas: Normal, with no mass or duct dilation. Spleen: Normal size. No mass. Adrenals/Urinary Tract: Normal adrenals. Nonobstructing 7 mm and 2 mm mid to upper right renal stones. No right hydronephrosis. New mild left hydroureteronephrosis to the level of the upper left pelvic ureter.  A few tiny nonobstructing left renal stones, largest 2 mm in the upper left kidney. Numerous small simple bilateral renal cysts and subcentimeter hypodense bilateral renal cortical lesions that are too small to characterize, largest 3.0 cm in the lower left kidney, for which no follow-up imaging is recommended. Normal caliber right ureter. No ureteral stones. Chronic mild diffuse bladder wall thickening. No bladder stones. Stomach/Bowel: Normal non-distended stomach. Oral  contrast transits to the cecum. Normal appendix with oral contrast within the lumen of the appendix. A few persistent mildly dilated mid to distal small bowel loops up to 3.4 cm diameter, improved. Previously described transition point in the right abdomen is no longer discretely visualized. No definite small bowel wall thickening. No pneumatosis. Mild sigmoid diverticulosis with no large bowel wall thickening or significant pericolonic fat stranding. Vascular/Lymphatic: Atherosclerotic nonaneurysmal abdominal aorta. No pathologically enlarged lymph nodes in the abdomen or pelvis. Reproductive: Normal size prostate. No pneumoperitoneum. Trace deep pelvic ascites is similar. No focal fluid collection. Other: No pneumoperitoneum, ascites or focal fluid collection. Musculoskeletal: No aggressive appearing focal osseous lesions. Marked multilevel lumbar degenerative disc disease. Partially visualized fixation hardware in the proximal left femur. IMPRESSION: 1. Resolving partial distal small bowel obstruction. Oral contrast transits to the cecum. Previously described transition point in the right abdomen is no longer discretely visualized. Mildly dilated small bowel loops are improved. 2. Nonobstructing bilateral nephrolithiasis. New mild left hydroureteronephrosis to the level of the upper left pelvic ureter, with no ureteral or bladder stones. Chronic mild diffuse bladder wall thickening. 3. Cholelithiasis. 4. Mild sigmoid diverticulosis. 5.  Aortic Atherosclerosis (ICD10-I70.0). Electronically Signed   By: Delbert Phenix M.D.   On: 03/01/2023 18:18    Anti-infectives: Anti-infectives (From admission, onward)    Start     Dose/Rate Route Frequency Ordered Stop   03/02/23 0000  cefTRIAXone (ROCEPHIN) 1 g in sodium chloride 0.9 % 100 mL IVPB       Note to Pharmacy: Start after urine culture collected   1 g 200 mL/hr over 30 Minutes Intravenous Every 24 hours 03/01/23 2312          Assessment/Plan pSBO,  unclear etiology - CT from overnight with oral contrast reviewed.  No evidence of SB mass.  Psbo has resolved and contrast in cecum already on imaging -start on FLD today -no acute surgical indications -never had a colonoscopy.  Needs GI follow up as an outpatient for screening colonoscopy  FEN - FLD VTE - coumadin on hold, but can likely resume given no needs for surgery ID - none currently needed  A fib CKD Gout H/o CVA  I reviewed hospitalist notes, last 24 h vitals and pain scores, last 48 h intake and output, last 24 h labs and trends, and last 24 h imaging results.   LOS: 2 days    Letha Cape , [facility] Surgery 03/02/2023, 9:15 AM Please see Amion for pager number during day hours 7:00am-4:30pm or 7:00am -11:30am on weekends

## 2023-03-02 NOTE — Consult Note (Incomplete)
[facility]                                                                                        Patient Identification: Patient Name: Ralph Sullivan MRN: [id number] Admit Date: 02/25/2023 12:33 PM Today's Date: 03/02/2023 Reason for consult: bacteremia  Requesting provider: CHAMP auto-consult   Principal Problem:   A-fib [facility]) Active Problems:   Weakness   Atrial fibrillation with RVR (HCC)   Antibiotics:  Ceftriaxone 11/4 Ampicillin 11/5-c  Lines/Hardware:  Assessment # GPC in pair and chains bacteremia 2/2 sets, BCID with Amp S E faecalis and MSSE- MSSE likely a contaminant : source GI vs GU  - reports dysuria, UA 11/4 with trace WBC and negative nitrite - CT 11/4  Nonobstructing 7 mm and 2 mm mid to upper right renal stones. No right hydronephrosis. New mild left hydroureteronephrosis to the level of the upper left pelvic ureter. A few tiny nonobstructing left renal stones, largest 2 mm in the upper left kidney. Cr stable at 1.39  # SBO/small bowel lesion - surgery following   Recommendations  - Continue ampicillin - Repeat 2 sets of blood cx today  - TTE today . Needs TEE if TTE negative - Fu urine cx  - Following   Rest of the management as per the primary team. Please call with questions or concerns.  Thank you for the consult  __________________________________________________________________________________________________________ HPI and Hospital Course: 77 YO male with PMH as below including Afib, hyperhidrosis on hands, CKD, CVA, E coli bacteremia/UTI in 2017 who presented to ED with generalized weakness, fatigue, difficulty walking.  At ED, developed, A fib w RVR during PT requiring admission.    At ED afebrile. Started developing fevers 11/4.  Labs at ED with Cr 1.48, BNP 194.5, wbc 7.7 Hospital course complicated by SBO and general surgery following  Imaging as  below  ID consulted for bacteremia   Reports passing gas from  rectum but has some difficulty urinating.  Denies any suprapubic pain flank pain or back pain.  Denies any presence of hardware's or peripheral joint pain or swelling. He is hungry and wants to eat.  ROS: General- Denies chills, loss of appetite and loss of weight HEENT - Denies headache, blurry vision, neck pain, sinus pain Chest - Denies any chest pain, SOB or cough CVS- Denies any dizziness/lightheadedness, syncopal attacks, palpitations Abdomen- Denies any nausea, vomiting, abdominal pain, hematochezia and diarrhea Neuro - Denies any weakness, numbness, tingling sensation Psych - Denies any changes in mood irritability or depressive symptoms GU- Denies any hematuria or increased frequency of urination Skin - denies any rashes/lesions MSK - denies any joint pain/swelling or restricted ROM   Past Medical History:  Diagnosis Date   Atrial fibrillation (HCC) 02/08/2013   CHADS-VAS - 2 (AGE, HTN). On anticoagulation. Holter 6/14 - 2.3 sec pause. Avg HR 87. 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   Past Surgical History:  Procedure Laterality Date   CATARACT EXTRACTION     HERNIA REPAIR     INGUINAL HERNIA REPAIR  07/16/2016   INGUINAL HERNIA REPAIR Left 07/16/2016   Procedure: LAPAROSCOPIC INGUINAL HERNIA REPAIR;  Surgeon: De Blanch Kinsinger, MD;  Location: [facility] OR;  Service: General;  Laterality: Left;   INSERTION OF MESH Left 07/16/2016   Procedure: INSERTION OF MESH;  Surgeon: De Blanch Kinsinger, MD;  Location: [facility] OR;  Service:  General;  Laterality: Left;   KIDNEY STONE SURGERY       Scheduled Meds:  fluticasone  2 spray Each Nare Daily   lidocaine  1 patch Transdermal Q24H   metoprolol tartrate  12.5 mg Oral BID   pantoprazole (PROTONIX) IV  40 mg Intravenous Q24H   warfarin  2.5 mg Oral ONCE-1600   Warfarin - Pharmacist Dosing Inpatient   Does not apply q1600   Continuous Infusions:  ampicillin (OMNIPEN) IV 2 g (03/02/23 1230)   PRN Meds:.acetaminophen, alum & mag hydroxide-simeth, diphenhydrAMINE, hydrocortisone cream, HYDROmorphone (DILAUDID) injection, ipratropium-albuterol, metoprolol tartrate, ondansetron **OR** ondansetron (ZOFRAN) IV, polyethylene glycol, polyvinyl alcohol  Allergies  Allergen Reactions   Allopurinol Other (See Comments)   Colchicine Other (See Comments)   Duloxetine Other (See Comments)    More nervous   Elemental Sulfur Nausea And Vomiting   Oxycontin [Oxycodone Hcl] Nausea And Vomiting   Social History   Socioeconomic History   Marital status: Married    Spouse name: Not on file   Number of children: Not on file   Years of education: Not on file   Highest education level: Not on file  Occupational History   Not on file  Tobacco Use   Smoking status: Former    Current packs/day: 0.00    Types: Cigarettes    Quit date: 03/1970    Years since quitting: 52.9   Smokeless tobacco: Never  Vaping Use   Vaping status: Never Used  Substance and Sexual Activity   Alcohol use: No    Alcohol/week: 0.0 standard drinks of alcohol   Drug use: No   Sexual activity: Not on file  Other Topics Concern   Not on file  Social History Narrative   Not on file   Social Determinants of Health   Financial Resource Strain: Not on file  Food Insecurity: No Food Insecurity (02/27/2023)   Hunger Vital Sign    Worried About Running Out of Food in the Last Year: Never true    Ran Out of Food in the Last Year: Never true  Transportation Needs: No Transportation Needs (02/27/2023)    PRAPARE - Administrator, Civil Service (Medical): No    Lack of Transportation (Non-Medical): No  Physical Activity: Not on file  Stress: Not on file  Social Connections: Not on file  Intimate Partner Violence: Not At Risk (02/27/2023)   Humiliation, Afraid, Rape, and Kick questionnaire    Fear of Current or Ex-Partner: No    Emotionally Abused: No    Physically Abused: No    Sexually Abused: No   Family History  Problem Relation Age of Onset   Hypertension Mother    Diabetes Mother     Vitals BP 125/84 (BP Location: Right Arm)   Pulse 87   Temp 98.4 F (36.9  C) (Oral)   Resp 17   Ht 6' (1.829 m)   Wt 70.4 kg   SpO2 95%   BMI 21.05 kg/m    Physical Exam Constitutional: Elderly male lying in the bed, nontoxic-appearing, NAD    Comments: HEENT WNL  Cardiovascular:     Rate and Rhythm: Normal rate and regular rhythm.     Heart sounds: S1 and S2  Pulmonary:     Effort: Pulmonary effort is normal.     Comments: Normal breath sounds  Abdominal:     Palpations: Abdomen is soft.     Tenderness: Nondistended and nontender, no suprapubic tenderness  Musculoskeletal:        General: No swelling or tenderness in peripheral joints  Skin:    Comments: No rashes  Neurological:     General: Awake, alert and oriented, following commands  Psychiatric:        Mood and Affect: Mood normal.    Pertinent Microbiology Results for orders placed or performed during the hospital encounter of 02/25/23  Resp panel by RT-PCR (RSV, Flu A&B, Covid) Anterior Nasal Swab     Status: None   Collection Time: 02/25/23  6:39 PM   Specimen: Anterior Nasal Swab  Result Value Ref Range Status   SARS Coronavirus 2 by RT PCR NEGATIVE NEGATIVE Final   Influenza A by PCR NEGATIVE NEGATIVE Final   Influenza B by PCR NEGATIVE NEGATIVE Final    Comment: (NOTE) The Xpert Xpress SARS-CoV-2/FLU/RSV plus assay is intended as an aid in the diagnosis of influenza from Nasopharyngeal swab  specimens and should not be used as a sole basis for treatment. Nasal washings and aspirates are unacceptable for Xpert Xpress SARS-CoV-2/FLU/RSV testing.  Fact Sheet for Patients: BloggerCourse.com  Fact Sheet for Healthcare Providers: SeriousBroker.it  This test is not yet approved or cleared by the [place] FDA and has been authorized for detection and/or diagnosis of SARS-CoV-2 by FDA under an Emergency Use Authorization (EUA). This EUA will remain in effect (meaning this test can be used) for the duration of the COVID-19 declaration under Section 564(b)(1) of the Act, 21 U.S.C. section 360bbb-3(b)(1), unless the authorization is terminated or revoked.     Resp Syncytial Virus by PCR NEGATIVE NEGATIVE Final    Comment: (NOTE) Fact Sheet for Patients: BloggerCourse.com  Fact Sheet for Healthcare Providers: SeriousBroker.it  This test is not yet approved or cleared by the [place] FDA and has been authorized for detection and/or diagnosis of SARS-CoV-2 by FDA under an Emergency Use Authorization (EUA). This EUA will remain in effect (meaning this test can be used) for the duration of the COVID-19 declaration under Section 564(b)(1) of the Act, 21 U.S.C. section 360bbb-3(b)(1), unless the authorization is terminated or revoked.  Performed at [facility], [address] [place], [place] [address]   Culture, blood (Routine X 2) w Reflex to ID Panel     Status: None (Preliminary result)   Collection Time: 03/01/23  5:58 PM   Specimen: BLOOD  Result Value Ref Range Status   Specimen Description BLOOD LEFT ANTECUBITAL  Final   Special Requests   Final    BOTTLES DRAWN AEROBIC AND ANAEROBIC Blood Culture adequate volume   Culture  Setup Time   Final    GRAM POSITIVE COCCI IN PAIRS AND CHAINS IN BOTH AEROBIC AND ANAEROBIC BOTTLES CRITICAL RESULT CALLED TO,  READ BACK BY AND VERIFIED WITH: PHARMD J.FRENS AT 1125 ON 03/02/2023 BY T.SAAD. Performed at [facility]  Lab, [address], [place], [place] [address]    Culture GRAM POSITIVE COCCI  Final   Report Status PENDING  Incomplete  Blood Culture ID Panel (Reflexed)     Status: Abnormal   Collection Time: 03/01/23  5:58 PM  Result Value Ref Range Status   Enterococcus faecalis DETECTED (A) NOT DETECTED Final    Comment: CRITICAL RESULT CALLED TO, READ BACK BY AND VERIFIED WITH: PHARMD J.FRENS AT 1125 ON 03/02/2023 BY T.SAAD.    Enterococcus Faecium NOT DETECTED NOT DETECTED Final   Listeria monocytogenes NOT DETECTED NOT DETECTED Final   Staphylococcus species DETECTED (A) NOT DETECTED Final    Comment: CRITICAL RESULT CALLED TO, READ BACK BY AND VERIFIED WITH: PHARMD J.FRENS AT 1125 ON 03/02/2023 BY T.SAAD.    Staphylococcus aureus (BCID) NOT DETECTED NOT DETECTED Final   Staphylococcus epidermidis DETECTED (A) NOT DETECTED Final    Comment: CRITICAL RESULT CALLED TO, READ BACK BY AND VERIFIED WITH: PHARMD J.FRENS AT 1125 ON 03/02/2023 BY T.SAAD.    Staphylococcus lugdunensis NOT DETECTED NOT DETECTED Final   Streptococcus species NOT DETECTED NOT DETECTED Final   Streptococcus agalactiae NOT DETECTED NOT DETECTED Final   Streptococcus pneumoniae NOT DETECTED NOT DETECTED Final   Streptococcus pyogenes NOT DETECTED NOT DETECTED Final   A.calcoaceticus-baumannii NOT DETECTED NOT DETECTED Final   Bacteroides fragilis NOT DETECTED NOT DETECTED Final   Enterobacterales NOT DETECTED NOT DETECTED Final   Enterobacter cloacae complex NOT DETECTED NOT DETECTED Final   Escherichia coli NOT DETECTED NOT DETECTED Final   Klebsiella aerogenes NOT DETECTED NOT DETECTED Final   Klebsiella oxytoca NOT DETECTED NOT DETECTED Final   Klebsiella pneumoniae NOT DETECTED NOT DETECTED Final   Proteus species NOT DETECTED NOT DETECTED Final   Salmonella species NOT DETECTED NOT DETECTED Final    Serratia marcescens NOT DETECTED NOT DETECTED Final   Haemophilus influenzae NOT DETECTED NOT DETECTED Final   Neisseria meningitidis NOT DETECTED NOT DETECTED Final   Pseudomonas aeruginosa NOT DETECTED NOT DETECTED Final   Stenotrophomonas maltophilia NOT DETECTED NOT DETECTED Final   Candida albicans NOT DETECTED NOT DETECTED Final   Candida auris NOT DETECTED NOT DETECTED Final   Candida glabrata NOT DETECTED NOT DETECTED Final   Candida krusei NOT DETECTED NOT DETECTED Final   Candida parapsilosis NOT DETECTED NOT DETECTED Final   Candida tropicalis NOT DETECTED NOT DETECTED Final   Cryptococcus neoformans/gattii NOT DETECTED NOT DETECTED Final   Methicillin resistance mecA/C NOT DETECTED NOT DETECTED Final   Vancomycin resistance NOT DETECTED NOT DETECTED Final    Comment: Performed at [facility], [address], [place], [place] [address]  Culture, blood (Routine X 2) w Reflex to ID Panel     Status: None (Preliminary result)   Collection Time: 03/01/23  6:07 PM   Specimen: BLOOD LEFT ARM  Result Value Ref Range Status   Specimen Description BLOOD LEFT ARM  Final   Special Requests   Final    BOTTLES DRAWN AEROBIC AND ANAEROBIC Blood Culture adequate volume   Culture  Setup Time   Final    GRAM POSITIVE COCCI IN CHAINS IN BOTH AEROBIC AND ANAEROBIC BOTTLES CRITICAL VALUE NOTED.  VALUE IS CONSISTENT WITH PREVIOUSLY REPORTED AND CALLED VALUE. Performed at [facility], [address] [place], [place] [address]    Culture GRAM POSITIVE COCCI  Final   Report Status PENDING  Incomplete    Pertinent Lab seen by me:    Latest Ref Rng &  Units 03/03/2023    5:04 AM 03/01/2023    4:31 AM 02/28/2023    3:53 AM  CBC  WBC 4.0 - 10.5 K/uL 6.7  4.8  5.9   Hemoglobin 13.0 - 17.0 g/dL 16.1  09.6  04.5   Hematocrit 39.0 - 52.0 % 38.3  32.4  34.8   Platelets 150 - 400 K/uL 168  148  163       Latest Ref Rng & Units 03/03/2023    5:04 AM 03/01/2023    4:31 AM 02/28/2023     3:53 AM  CMP  Glucose 70 - 99 mg/dL 409  94  811   BUN 8 - 23 mg/dL 16  20  22    Creatinine 0.61 - 1.24 mg/dL 9.14  7.82  9.56   Sodium 135 - 145 mmol/L 129  131  131   Potassium 3.5 - 5.1 mmol/L 4.1  4.3  4.5   Chloride 98 - 111 mmol/L 91  101  99   CO2 22 - 32 mmol/L 26  26  23    Calcium 8.9 - 10.3 mg/dL 9.4  9.0  9.5   Total Protein 6.5 - 8.1 g/dL 6.7     Total Bilirubin <1.2 mg/dL 0.8     Alkaline Phos 38 - 126 U/L 53     AST 15 - 41 U/L 21     ALT 0 - 44 U/L 12        Pertinent Imagings/Other Imagings Plain films and CT images have been personally visualized and interpreted; radiology reports have been reviewed. Decision making incorporated into the Impression / Recommendations.  DG CHEST PORT 1 VIEW  Result Date: 03/01/2023 CLINICAL DATA:  Fever EXAM: PORTABLE CHEST 1 VIEW COMPARISON:  02/25/2023 FINDINGS: The heart size and mediastinal contours are within normal limits. Both lungs are clear. The visualized skeletal structures are unremarkable. IMPRESSION: No active disease. Electronically Signed   By: Helyn Numbers M.D.   On: 03/01/2023 21:55   CT ABDOMEN PELVIS WO CONTRAST  Result Date: 03/01/2023 CLINICAL DATA:  Inpatient. Follow-up small bowel obstruction. Acute abdominal pain. EXAM: CT ABDOMEN AND PELVIS WITHOUT CONTRAST TECHNIQUE: Multidetector CT imaging of the abdomen and pelvis was performed following the standard protocol without IV contrast. RADIATION DOSE REDUCTION: This exam was performed according to the departmental dose-optimization program which includes automated exposure control, adjustment of the mA and/or kV according to patient size and/or use of iterative reconstruction technique. COMPARISON:  02/27/2023 CT abdomen/pelvis. FINDINGS: Portions of the upper abdomen including superior liver, spleen and stomach are inadvertently excluded from the study. Lower chest: No acute abnormality. Hepatobiliary: Normal liver size. No liver mass. Cholelithiasis. No  gallbladder distention, gallbladder wall thickening or pericholecystic fluid. No biliary ductal dilatation. Pancreas: Normal, with no mass or duct dilation. Spleen: Normal size. No mass. Adrenals/Urinary Tract: Normal adrenals. Nonobstructing 7 mm and 2 mm mid to upper right renal stones. No right hydronephrosis. New mild left hydroureteronephrosis to the level of the upper left pelvic ureter. A few tiny nonobstructing left renal stones, largest 2 mm in the upper left kidney. Numerous small simple bilateral renal cysts and subcentimeter hypodense bilateral renal cortical lesions that are too small to characterize, largest 3.0 cm in the lower left kidney, for which no follow-up imaging is recommended. Normal caliber right ureter. No ureteral stones. Chronic mild diffuse bladder wall thickening. No bladder stones. Stomach/Bowel: Normal non-distended stomach. Oral contrast transits to the cecum. Normal appendix with oral contrast within the lumen of the  appendix. A few persistent mildly dilated mid to distal small bowel loops up to 3.4 cm diameter, improved. Previously described transition point in the right abdomen is no longer discretely visualized. No definite small bowel wall thickening. No pneumatosis. Mild sigmoid diverticulosis with no large bowel wall thickening or significant pericolonic fat stranding. Vascular/Lymphatic: Atherosclerotic nonaneurysmal abdominal aorta. No pathologically enlarged lymph nodes in the abdomen or pelvis. Reproductive: Normal size prostate. No pneumoperitoneum. Trace deep pelvic ascites is similar. No focal fluid collection. Other: No pneumoperitoneum, ascites or focal fluid collection. Musculoskeletal: No aggressive appearing focal osseous lesions. Marked multilevel lumbar degenerative disc disease. Partially visualized fixation hardware in the proximal left femur. IMPRESSION: 1. Resolving partial distal small bowel obstruction. Oral contrast transits to the cecum. Previously  described transition point in the right abdomen is no longer discretely visualized. Mildly dilated small bowel loops are improved. 2. Nonobstructing bilateral nephrolithiasis. New mild left hydroureteronephrosis to the level of the upper left pelvic ureter, with no ureteral or bladder stones. Chronic mild diffuse bladder wall thickening. 3. Cholelithiasis. 4. Mild sigmoid diverticulosis. 5.  Aortic Atherosclerosis (ICD10-I70.0). Electronically Signed   By: Delbert Phenix M.D.   On: 03/01/2023 18:18   CT ABDOMEN PELVIS WO CONTRAST  Result Date: 02/27/2023 CLINICAL DATA:  Abdominal pain, bowel obstruction suspected EXAM: CT ABDOMEN AND PELVIS WITHOUT CONTRAST TECHNIQUE: Multidetector CT imaging of the abdomen and pelvis was performed following the standard protocol without IV contrast. RADIATION DOSE REDUCTION: This exam was performed according to the departmental dose-optimization program which includes automated exposure control, adjustment of the mA and/or kV according to patient size and/or use of iterative reconstruction technique. COMPARISON:  Abdominal radiograph dated 12/28/2022. CT abdomen/pelvis dated 07/16/2016. FINDINGS: Lower chest: Mild scarring in the left lower lobe. Hepatobiliary: Unenhanced liver is unremarkable, noting a benign calcified granuloma. Layering tiny gallstones (series 3/image 29), without associated inflammatory changes. No intrahepatic or extrahepatic ductal dilatation. Pancreas: Within normal limits. Spleen: Within normal limits. Adrenals/Urinary Tract: Adrenal glands are within normal limits. Numerous bilateral renal cysts, measuring up to 3.4 cm in the left lower kidney (series 3/image 41), benign (Bosniak I). No follow-up is recommended. Bilateral renal cortical atrophy. Multiple nonobstructing bilateral renal calculi measuring up to 8 mm in the right upper kidney (series 3/image 33). No ureteral or bladder calculi. No hydronephrosis. Bladder is within normal limits.  Stomach/Bowel: Stomach is within normal limits. Dilated small bowel throughout the abdomen. Transition point in distal ileum where there is short segment narrowing (series 3/image 35; coronal image 61). An underlying small-bowel lesion is possible. Normal appendix (series 3/image 3). Left colonic diverticulosis, without evidence of diverticulitis. Vascular/Lymphatic: No evidence of abdominal aortic aneurysm. Atherosclerotic calcifications of the abdominal aorta and branch vessels. No suspicious abdominopelvic lymphadenopathy. Reproductive: Dystrophic calcifications in the prostate. Other: No abdominopelvic ascites. Tiny fat containing periumbilical hernia (series 3/image 46). Musculoskeletal: Degenerative changes of the visualized thoracolumbar spine. Status post ORIF of the left hip. IMPRESSION: Small bowel obstruction with transition point in the distal ileum. An underlying small bowel lesion is possible. Additional ancillary findings as above. Electronically Signed   By: Charline Bills M.D.   On: 02/27/2023 21:19   DG Abd 1 View  Result Date: 02/27/2023 CLINICAL DATA:  Abdominal pain. EXAM: ABDOMEN - 1 VIEW COMPARISON:  Abdominopelvic CT 07/16/2016. FINDINGS: 1600 hours. Single portable supine view of the abdomen demonstrates mildly dilated small bowel loops in the central abdomen, asymmetric to the right. The stomach appears mildly distended. No significant colonic distention identified. There is  no supine evidence of pneumoperitoneum. Previous CT demonstrated a left inguinal hernia which is not visualized on this examination. Possible right renal and bladder calculi. Severe lumbar spondylosis associated with a convex left scoliosis. IMPRESSION: Mildly dilated small bowel loops in the central abdomen, asymmetric to the right, suboptimally evaluated on this single supine examination. Findings could be secondary to an ileus versus early or partial small bowel obstruction. Correlate clinically. Consider  further evaluation with follow-up abdominopelvic CT. Electronically Signed   By: Carey Bullocks M.D.   On: 02/27/2023 16:26   DG Chest 2 View  Result Date: 02/25/2023 CLINICAL DATA:  Chills. EXAM: CHEST - 2 VIEW COMPARISON:  July 23, 2022 FINDINGS: The cardiac silhouette is within normal limits. There is marked severity tortuosity of the descending thoracic aorta. Both lungs are clear. Multilevel degenerative changes are noted throughout the thoracic spine. IMPRESSION: No active cardiopulmonary disease. Electronically Signed   By: Aram Candela M.D.   On: 02/25/2023 18:12    I have personally spent 87 minutes involved in face-to-face and non-face-to-face activities for this patient on the day of the visit. Professional time spent includes the following activities: Preparing to see the patient (review of tests), Obtaining and/or reviewing separately obtained history (admission/discharge record), Performing a medically appropriate examination and/or evaluation , Ordering medications/tests/procedures, referring and communicating with other health care professionals, Documenting clinical information in the EMR, Independently interpreting results (not separately reported), Communicating results to the patient/family/caregiver, Counseling and educating the patient/family/caregiver and Care coordination (not separately reported).  Electronically signed by:   Plan d/w requesting provider as well as ID pharm D  Of note, portions of this note may have been created with voice recognition software. While this note has been edited for accuracy, occasional wrong-word or 'sound-a-like' substitutions may have occurred due to the inherent limitations of voice recognition software.   Odette Fraction, MD Infectious Disease Physician [facility] Pager: [phone]

## 2023-03-02 NOTE — Plan of Care (Signed)

## 2023-03-02 NOTE — TOC Progression Note (Addendum)
Transition of Care [facility]) - Progression Note    Patient Details  Name: Ralph Sullivan MRN: [id number] Date of Birth: 04/10/1946  [facility] CM/SW Contact  Delilah Shan, LCSWA Phone Number: 03/02/2023, 2:02 PM  Clinical Narrative:     CSW spoke with French Ana with [facility] who informed CSW that they can offer SNF bed for patient at [facility]. CSW informed patient. Patient accepted SNF bed offer with [facility]. TOC having trouble starting insurance auth. CSW informed Facility.Facility started [facility]. CSW will continue to follow.  Expected Discharge Plan: Skilled Nursing Facility Barriers to Discharge: Continued Medical Work up  Expected Discharge Plan and Services       Living arrangements for the past 2 months: Single Family Home                                       Social Determinants of Health (SDOH) Interventions SDOH Screenings   Food Insecurity: No Food Insecurity (02/27/2023)  Housing: Low Risk  (02/27/2023)  Transportation Needs: No Transportation Needs (02/27/2023)  Utilities: Not At Risk (02/27/2023)  Tobacco Use: Medium Risk (02/25/2023)    Readmission Risk Interventions     No data to display

## 2023-03-02 NOTE — Progress Notes (Signed)
PHARMACY - PHYSICIAN COMMUNICATION CRITICAL VALUE ALERT - BLOOD CULTURE IDENTIFICATION (BCID)  Ralph Sullivan is an 77 y.o. male who presented to [facility] on 02/25/2023 with a chief complaint of weakness.  He developed fever on 11/4 and blood cultures were drawn and are now positive for enterococcus faecalis and staph epi on BCID  Assessment:  Hospital onset bacteremia with enterococcus faecalis.  Staph epi could be contamination  Name of physician (or Provider) Contacted: Dr. Lowell Guitar and [facility]  Current antibiotics: Ceftriaxone  Changes to prescribed antibiotics recommended: Change ceftriaxone to ampicillin Recommendations accepted by provider  Results for orders placed or performed during the hospital encounter of 02/25/23  Blood Culture ID Panel (Reflexed) (Collected: 03/01/2023  5:58 PM)  Result Value Ref Range   Enterococcus faecalis DETECTED (A) NOT DETECTED   Enterococcus Faecium NOT DETECTED NOT DETECTED   Listeria monocytogenes NOT DETECTED NOT DETECTED   Staphylococcus species DETECTED (A) NOT DETECTED   Staphylococcus aureus (BCID) NOT DETECTED NOT DETECTED   Staphylococcus epidermidis DETECTED (A) NOT DETECTED   Staphylococcus lugdunensis NOT DETECTED NOT DETECTED   Streptococcus species NOT DETECTED NOT DETECTED   Streptococcus agalactiae NOT DETECTED NOT DETECTED   Streptococcus pneumoniae NOT DETECTED NOT DETECTED   Streptococcus pyogenes NOT DETECTED NOT DETECTED   A.calcoaceticus-baumannii NOT DETECTED NOT DETECTED   Bacteroides fragilis NOT DETECTED NOT DETECTED   Enterobacterales NOT DETECTED NOT DETECTED   Enterobacter cloacae complex NOT DETECTED NOT DETECTED   Escherichia coli NOT DETECTED NOT DETECTED   Klebsiella aerogenes NOT DETECTED NOT DETECTED   Klebsiella oxytoca NOT DETECTED NOT DETECTED   Klebsiella pneumoniae NOT DETECTED NOT DETECTED   Proteus species NOT DETECTED NOT DETECTED   Salmonella species NOT DETECTED NOT DETECTED   Serratia  marcescens NOT DETECTED NOT DETECTED   Haemophilus influenzae NOT DETECTED NOT DETECTED   Neisseria meningitidis NOT DETECTED NOT DETECTED   Pseudomonas aeruginosa NOT DETECTED NOT DETECTED   Stenotrophomonas maltophilia NOT DETECTED NOT DETECTED   Candida albicans NOT DETECTED NOT DETECTED   Candida auris NOT DETECTED NOT DETECTED   Candida glabrata NOT DETECTED NOT DETECTED   Candida krusei NOT DETECTED NOT DETECTED   Candida parapsilosis NOT DETECTED NOT DETECTED   Candida tropicalis NOT DETECTED NOT DETECTED   Cryptococcus neoformans/gattii NOT DETECTED NOT DETECTED   Methicillin resistance mecA/C NOT DETECTED NOT DETECTED   Vancomycin resistance NOT DETECTED NOT DETECTED    Mickeal Skinner 03/02/2023  11:35 AM

## 2023-03-02 NOTE — Progress Notes (Signed)
PHARMACY - ANTICOAGULATION CONSULT NOTE  Pharmacy Consult for Warfarin Indication: atrial fibrillation  Allergies  Allergen Reactions   Allopurinol Other (See Comments)   Colchicine Other (See Comments)   Duloxetine Other (See Comments)    More nervous   Elemental Sulfur Nausea And Vomiting   Oxycontin [Oxycodone Hcl] Nausea And Vomiting    Patient Measurements: Height: 6' (182.9 cm) Weight: 70.4 kg (155 lb 3.3 oz) IBW/kg (Calculated) : 77.6 Heparin dosing weight: 70.4 kg  Vital Signs: Temp: 98.4 F (36.9 C) (11/05 0945) Temp Source: Oral (11/05 0945) BP: 145/89 (11/05 0347) Pulse Rate: 82 (11/05 0427)  Labs: Recent Labs    02/28/23 0353 02/28/23 1337 02/28/23 2056 03/01/23 0431 03/02/23 0725  HGB 11.5*  --   --  10.9*  --   HCT 34.8*  --   --  32.4*  --   PLT 163  --   --  148*  --   LABPROT 22.3*  --   --  25.6* 28.7*  INR 1.9*  --   --  2.3* 2.7*  HEPARINUNFRC  --  <0.10* 0.38 0.47  --   CREATININE 1.42*  --   --  1.38*  --     Estimated Creatinine Clearance: 46.1 mL/min (A) (by C-G formula based on SCr of 1.38 mg/dL (H)).   Medical History: Past Medical History:  Diagnosis Date   Atrial fibrillation (HCC) 02/08/2013   CHADS-VAS - 2 (AGE, HTN). On anticoagulation. Holter 6/14 - 2.3 sec pause. Avg HR 87. 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 Pt has hx AF and CVA and is on warfarin PTA. Warfarin last dose given 11/2, then held with possible SBO. Warfarin resumed 11/5 as there are no plans for procedures  -INR=  2.7 (trend up)  Home warfarin dose: 5mg  daily (per patient)  Goal of Therapy:  Heparin level 0.3-0.7 units/mL INR 2-3 Monitor platelets by anticoagulation protocol: Yes   Plan:  -Warfarin 2.5mg  po today -Daily PT/INR  Harland German, PharmD Clinical Pharmacist **Pharmacist phone directory can now be found on [email or web address] ([email or web address]).  Listed under [facility].

## 2023-03-02 NOTE — Progress Notes (Signed)
Mobility Specialist Progress Note:   03/02/23 1113  Mobility  Activity Transferred from bed to chair  Level of Assistance +2 (takes two people)  Press photographer wheel walker  Distance Ambulated (ft) 4 ft  Activity Response Tolerated fair  Mobility Referral Yes  $Mobility charge 1 Mobility  Mobility Specialist Start Time (ACUTE ONLY) 1109  Mobility Specialist Stop Time (ACUTE ONLY) 1119  Mobility Specialist Time Calculation (min) (ACUTE ONLY) 10 min    Pre Mobility: 123 HR During Mobility: 170 HR Post Mobility:  111 HR  Pt received in bed, agreeable to mobility. C/o being cold today. Able to stand and pivot the chair w/ modA+2. HR spiked at 170 bpm upon reaching chair, but quickly dropped to the 110s. Asymptomatic throughout. Pt left in chair with call bell and all needs met. RN aware.  D'Vante Earlene Plater Mobility Specialist Please contact via Special educational needs teacher or Rehab office at [phone]

## 2023-03-02 NOTE — Progress Notes (Signed)
Mobility Specialist Progress Note:   03/02/23 1200  Therapy Vitals  Temp 98.4 F (36.9 C)  Temp Source Oral  Pulse Rate 100  Resp 18  BP (!) 175/120  Patient Position (if appropriate) Lying  Mobility  Activity Transferred from chair to bed  Level of Assistance +2 (takes two people)  Press photographer wheel walker  Distance Ambulated (ft) 4 ft  Activity Response Tolerated fair  Mobility Referral Yes  $Mobility charge 1 Mobility  Mobility Specialist Start Time (ACUTE ONLY) 1150  Mobility Specialist Stop Time (ACUTE ONLY) 1157  Mobility Specialist Time Calculation (min) (ACUTE ONLY) 7 min    Pt received in chair, requesting to return to bed d/t discomfort and no longer tolerable. ModA+2 to stand and pivot to bed. HR high again and pt presented w/ some SOB. Pt left in bed after symptoms subsided with all needs met and NT present. RN aware.  D'Vante Earlene Plater Mobility Specialist Please contact via Special educational needs teacher or Rehab office at [phone]

## 2023-03-03 ENCOUNTER — Inpatient Hospital Stay (HOSPITAL_COMMUNITY): Payer: Medicare HMO

## 2023-03-03 DIAGNOSIS — R7881 Bacteremia: Secondary | ICD-10-CM

## 2023-03-03 DIAGNOSIS — I4891 Unspecified atrial fibrillation: Secondary | ICD-10-CM

## 2023-03-03 DIAGNOSIS — B952 Enterococcus as the cause of diseases classified elsewhere: Secondary | ICD-10-CM | POA: Diagnosis not present

## 2023-03-03 DIAGNOSIS — I4811 Longstanding persistent atrial fibrillation: Secondary | ICD-10-CM | POA: Diagnosis not present

## 2023-03-03 LAB — CBC WITH DIFFERENTIAL/PLATELET
Abs Immature Granulocytes: 0.02 10*3/uL (ref 0.00–0.07)
Basophils Absolute: 0 10*3/uL (ref 0.0–0.1)
Basophils Relative: 1 %
Eosinophils Absolute: 0.1 10*3/uL (ref 0.0–0.5)
Eosinophils Relative: 1 %
HCT: 38.3 % — ABNORMAL LOW (ref 39.0–52.0)
Hemoglobin: 12.6 g/dL — ABNORMAL LOW (ref 13.0–17.0)
Immature Granulocytes: 0 %
Lymphocytes Relative: 13 %
Lymphs Abs: 0.9 10*3/uL (ref 0.7–4.0)
MCH: 32.6 pg (ref 26.0–34.0)
MCHC: 32.9 g/dL (ref 30.0–36.0)
MCV: 99.2 fL (ref 80.0–100.0)
Monocytes Absolute: 0.6 10*3/uL (ref 0.1–1.0)
Monocytes Relative: 9 %
Neutro Abs: 5.1 10*3/uL (ref 1.7–7.7)
Neutrophils Relative %: 76 %
Platelets: 168 10*3/uL (ref 150–400)
RBC: 3.86 MIL/uL — ABNORMAL LOW (ref 4.22–5.81)
RDW: 13.2 % (ref 11.5–15.5)
WBC: 6.7 10*3/uL (ref 4.0–10.5)
nRBC: 0 % (ref 0.0–0.2)

## 2023-03-03 LAB — COMPREHENSIVE METABOLIC PANEL
ALT: 12 U/L (ref 0–44)
AST: 21 U/L (ref 15–41)
Albumin: 3.2 g/dL — ABNORMAL LOW (ref 3.5–5.0)
Alkaline Phosphatase: 53 U/L (ref 38–126)
Anion gap: 12 (ref 5–15)
BUN: 16 mg/dL (ref 8–23)
CO2: 26 mmol/L (ref 22–32)
Calcium: 9.4 mg/dL (ref 8.9–10.3)
Chloride: 91 mmol/L — ABNORMAL LOW (ref 98–111)
Creatinine, Ser: 1.39 mg/dL — ABNORMAL HIGH (ref 0.61–1.24)
GFR, Estimated: 53 mL/min — ABNORMAL LOW (ref >60)
Glucose, Bld: 101 mg/dL — ABNORMAL HIGH (ref 70–99)
Potassium: 4.1 mmol/L (ref 3.5–5.1)
Sodium: 129 mmol/L — ABNORMAL LOW (ref 135–145)
Total Bilirubin: 0.8 mg/dL (ref <1.2)
Total Protein: 6.7 g/dL (ref 6.5–8.1)

## 2023-03-03 LAB — ECHOCARDIOGRAM COMPLETE
Height: 72 in
S' Lateral: 4.3 cm
Weight: 2483.26 [oz_av]

## 2023-03-03 LAB — PROTIME-INR
INR: 2.6 — ABNORMAL HIGH (ref 0.8–1.2)
Prothrombin Time: 27.9 s — ABNORMAL HIGH (ref 11.4–15.2)

## 2023-03-03 LAB — MAGNESIUM: Magnesium: 1.7 mg/dL (ref 1.7–2.4)

## 2023-03-03 LAB — PHOSPHORUS: Phosphorus: 3.1 mg/dL (ref 2.5–4.6)

## 2023-03-03 MED ORDER — PERFLUTREN LIPID MICROSPHERE
1.0000 mL | INTRAVENOUS | Status: AC | PRN
  Administered 2023-03-03: 4 mL via INTRAVENOUS

## 2023-03-03 MED ORDER — WARFARIN SODIUM 5 MG PO TABS
5.0000 mg | ORAL_TABLET | Freq: Once | ORAL | Status: AC
  Administered 2023-03-03: 5 mg via ORAL
  Filled 2023-03-03: qty 1

## 2023-03-03 MED ORDER — METOPROLOL TARTRATE 25 MG PO TABS
25.0000 mg | ORAL_TABLET | Freq: Two times a day (BID) | ORAL | Status: DC
  Administered 2023-03-03 – 2023-03-08 (×11): 25 mg via ORAL
  Filled 2023-03-03 (×11): qty 1

## 2023-03-03 NOTE — Plan of Care (Signed)
Patient ambulated to the chair and his heart rate went into the 170s and he only sustained for a seconds and the patient returned to normal when calmed down. The patient has a spell of nausea and dry heaving closer to the end of the shift. The patient has been running a low grade fever. I sat and talk with the patient to offer support to him so that he can rest assure we are here to help and everything is going to be work out. He cried asked for a hug. Sat with patient to just listen for 5-10 extra minutes and ended my shift. Will continue to monitor patient.

## 2023-03-03 NOTE — Care Management Important Message (Signed)
Important Message  Patient Details  Name: Ralph Sullivan MRN: [id number] Date of Birth: 1946/01/15   Important Message Given:  Yes - Medicare IM     Dorena Bodo 03/03/2023, 3:36 PM

## 2023-03-03 NOTE — Progress Notes (Signed)
Physical Therapy Treatment Patient Details Name: Ralph Sullivan MRN: [id number] DOB: 1945/10/27 Today's Date: 03/03/2023   History of Present Illness The pt is a 77 yo male presenting 10/31 with generalized weakness in BLE. PMH includes: atrial fibrillation on metoprolol and warfarin, hyperhidrosis of the hands, and CKD.    PT Comments  Pt admitted with above diagnosis. Pt limited by incr HR with transfer only.  Nurse made aware.  Pt requires +2 mod assist for mobility thus far. STill needs post acute REhab < 3 hours day.   Pt currently with functional limitations due to the deficits listed below (see PT Problem List). Pt will benefit from acute skilled PT to increase their independence and safety with mobility to allow discharge.       If plan is discharge home, recommend the following: A lot of help with walking and/or transfers;A lot of help with bathing/dressing/bathroom;Assistance with cooking/housework;Direct supervision/assist for medications management;Direct supervision/assist for financial management;Assist for transportation;Help with stairs or ramp for entrance   Can travel by private vehicle     No  Equipment Recommendations  None recommended by PT    Recommendations for Other Services OT consult     Precautions / Restrictions Precautions Precautions: Fall Precaution Comments: watch HR and BP Restrictions Weight Bearing Restrictions: No     Mobility  Bed Mobility Overal bed mobility: Needs Assistance Bed Mobility: Supine to Sit, Sit to Supine     Supine to sit: Min assist, HOB elevated     General bed mobility comments: Needed some assist to get OOB for safety. assist at trunk.    Transfers Overall transfer level: Needs assistance Equipment used: Rolling walker (2 wheels) Transfers: Sit to/from Stand, Bed to chair/wheelchair/BSC Sit to Stand: Mod assist, +2 physical assistance, From elevated surface   Step pivot transfers: Mod assist, +2 physical assistance        General transfer comment: LE's very weak and ataxic when up to feet needing +2 for sit to stand and transfer to chair.  Pt shaky and weak with poor postural stability in standing.  Upon standing, pt HR from 98 bpm up to 180 bpm.  Once pt sat in chair, HR stabilized therefore left pt in chair.  BP 129/79.  Nurse aware of incr HR.    Ambulation/Gait                   Stairs             Wheelchair Mobility     Tilt Bed    Modified Rankin (Stroke Patients Only)       Balance Overall balance assessment: Needs assistance Sitting-balance support: No upper extremity supported, Feet supported Sitting balance-Leahy Scale: Fair     Standing balance support: Bilateral upper extremity supported, During functional activity Standing balance-Leahy Scale: Poor Standing balance comment: BUE support and assist of 2                            Cognition Arousal: Alert Behavior During Therapy: WFL for tasks assessed/performed Overall Cognitive Status: Within Functional Limits for tasks assessed                                          Exercises General Exercises - Lower Extremity Ankle Circles/Pumps: AROM, Both, 10 reps    General Comments General comments (skin integrity,  edema, etc.): HR to 180 bpm during transfer but stabilized quickly.      Pertinent Vitals/Pain Pain Assessment Pain Assessment: Faces Faces Pain Scale: Hurts even more Pain Location: low back Pain Descriptors / Indicators: Constant, Aching Pain Intervention(s): Limited activity within patient's tolerance, Monitored during session, Repositioned    Home Living                          Prior Function            PT Goals (current goals can now be found in the care plan section) Acute Rehab PT Goals Patient Stated Goal: improve endurance and return home Progress towards PT goals: Progressing toward goals    Frequency    Min 1X/week      PT  Plan      Co-evaluation              AM-PAC PT "6 Clicks" Mobility   Outcome Measure  Help needed turning from your back to your side while in a flat bed without using bedrails?: Total Help needed moving from lying on your back to sitting on the side of a flat bed without using bedrails?: Total Help needed moving to and from a bed to a chair (including a wheelchair)?: Total Help needed standing up from a chair using your arms (e.g., wheelchair or bedside chair)?: Total Help needed to walk in hospital room?: Total Help needed climbing 3-5 steps with a railing? : Total 6 Click Score: 6    End of Session Equipment Utilized During Treatment: Gait belt Activity Tolerance: Treatment limited secondary to medical complications (Comment) (HR incr with little activity) Patient left: with call bell/phone within reach;in chair;with chair alarm set Nurse Communication: Other (comment);Mobility status (HR incr) PT Visit Diagnosis: Unsteadiness on feet (R26.81);Other abnormalities of gait and mobility (R26.89);Muscle weakness (generalized) (M62.81)     Time: 1610-9604 PT Time Calculation (min) (ACUTE ONLY): 13 min  Charges:    $Therapeutic Activity: 8-22 mins PT General Charges $$ ACUTE PT VISIT: 1 Visit                     [name] M,PT Acute Rehab Services [phone]    [name] 03/03/2023, 11:04 AM

## 2023-03-03 NOTE — Progress Notes (Signed)
[facility] PROGRESS NOTE    Progress Note  Ralph Sullivan  ZOX:[id number] DOB: Apr 30, 1945 DOA: 02/25/2023 PCP: Lorenda Ishihara, MD     Brief Narrative:   Ralph Sullivan is an 77 y.o. male past medical history of A-fib on Coumadin chronic kidney disease stage IIIa, CVA comes in with generalized weakness complicated by A-fib with RVR and found to have Enterococcus faecalis bacteremia   Assessment/Plan:   Enterococcus faecalis bacteremia: BC ID positive for Enterococcus, surveillance blood culture on 03/02/2025. 2D echo is pending. Continue ampicillin. And staph epi may be a contaminant. Awaiting ID further recommendations.  Mild left hydroureteronephrosis: Urine cultures are pending.  Small bowel obstruction/small bowel lesion: CT scan of the abdomen pelvis showed partially resolving SBO oral contrast in cecum.  Previously described transition point is no longer appreciated. Surgery consulted recommended FLD no surgical intervention has never had a colonoscopy. Cleared to resume Coumadin.  A-fib with RVR: Initially treated now improved now on oral metoprolol. Heart rate in the 90s will resume to his home dose of 25 mg of metoprolol. Continue Coumadin INR therapeutic.  Sinus bradycardia: Clonidine held. Heart rate now has been fast and metoprolol has been resumed at low-dose okay to increase.  Generalized weakness and deconditioning: Probably related to cardiac condition and age related. PT OT has been consulted recommended skilled nursing facility.  Essential hypertension/chronic systolic heart failure: Clonidine was held as he was having 3-second pauses. Metoprolol was resumed.  Chronic kidney disease stage IIIa: With back creatinine at baseline.   DVT prophylaxis: coumadin Family Communication:none Status is: Inpatient Remains inpatient appropriate because: Enterococcus faecalis bacteremia    Code Status:     Code Status Orders  (From  admission, onward)           Start     Ordered   02/26/23 1526  Full code  Continuous       Question:  By:  Answer:  Consent: discussion documented in EHR   02/26/23 1526           Code Status History     Date Active Date Inactive Code Status Order ID Comments User Context   07/23/2022 1721 07/24/2022 1924 Full Code [id number]  Synetta Fail, MD ED   07/16/2016 0756 07/17/2016 1449 Full Code [id number]  Jerre Simon, PA ED   10/14/2015 2325 10/19/2015 1526 Full Code [id number]  Pearson Grippe, MD Inpatient      Advance Directive Documentation    Flowsheet Row Most Recent Value  Type of Advance Directive Healthcare Power of Attorney, Living will  Pre-existing out of facility DNR order (yellow form or pink MOST form) --  "MOST" Form in Place? --         IV Access:   Peripheral IV   Procedures and diagnostic studies:   DG CHEST PORT 1 VIEW  Result Date: 03/01/2023 CLINICAL DATA:  Fever EXAM: PORTABLE CHEST 1 VIEW COMPARISON:  02/25/2023 FINDINGS: The heart size and mediastinal contours are within normal limits. Both lungs are clear. The visualized skeletal structures are unremarkable. IMPRESSION: No active disease. Electronically Signed   By: Helyn Numbers M.D.   On: 03/01/2023 21:55   CT ABDOMEN PELVIS WO CONTRAST  Result Date: 03/01/2023 CLINICAL DATA:  Inpatient. Follow-up small bowel obstruction. Acute abdominal pain. EXAM: CT ABDOMEN AND PELVIS WITHOUT CONTRAST TECHNIQUE: Multidetector CT imaging of the abdomen and pelvis was performed following the standard protocol without IV contrast. RADIATION DOSE REDUCTION: This exam was performed according to the departmental  dose-optimization program which includes automated exposure control, adjustment of the mA and/or kV according to patient size and/or use of iterative reconstruction technique. COMPARISON:  02/27/2023 CT abdomen/pelvis. FINDINGS: Portions of the upper abdomen including superior liver, spleen and stomach  are inadvertently excluded from the study. Lower chest: No acute abnormality. Hepatobiliary: Normal liver size. No liver mass. Cholelithiasis. No gallbladder distention, gallbladder wall thickening or pericholecystic fluid. No biliary ductal dilatation. Pancreas: Normal, with no mass or duct dilation. Spleen: Normal size. No mass. Adrenals/Urinary Tract: Normal adrenals. Nonobstructing 7 mm and 2 mm mid to upper right renal stones. No right hydronephrosis. New mild left hydroureteronephrosis to the level of the upper left pelvic ureter. A few tiny nonobstructing left renal stones, largest 2 mm in the upper left kidney. Numerous small simple bilateral renal cysts and subcentimeter hypodense bilateral renal cortical lesions that are too small to characterize, largest 3.0 cm in the lower left kidney, for which no follow-up imaging is recommended. Normal caliber right ureter. No ureteral stones. Chronic mild diffuse bladder wall thickening. No bladder stones. Stomach/Bowel: Normal non-distended stomach. Oral contrast transits to the cecum. Normal appendix with oral contrast within the lumen of the appendix. A few persistent mildly dilated mid to distal small bowel loops up to 3.4 cm diameter, improved. Previously described transition point in the right abdomen is no longer discretely visualized. No definite small bowel wall thickening. No pneumatosis. Mild sigmoid diverticulosis with no large bowel wall thickening or significant pericolonic fat stranding. Vascular/Lymphatic: Atherosclerotic nonaneurysmal abdominal aorta. No pathologically enlarged lymph nodes in the abdomen or pelvis. Reproductive: Normal size prostate. No pneumoperitoneum. Trace deep pelvic ascites is similar. No focal fluid collection. Other: No pneumoperitoneum, ascites or focal fluid collection. Musculoskeletal: No aggressive appearing focal osseous lesions. Marked multilevel lumbar degenerative disc disease. Partially visualized fixation hardware  in the proximal left femur. IMPRESSION: 1. Resolving partial distal small bowel obstruction. Oral contrast transits to the cecum. Previously described transition point in the right abdomen is no longer discretely visualized. Mildly dilated small bowel loops are improved. 2. Nonobstructing bilateral nephrolithiasis. New mild left hydroureteronephrosis to the level of the upper left pelvic ureter, with no ureteral or bladder stones. Chronic mild diffuse bladder wall thickening. 3. Cholelithiasis. 4. Mild sigmoid diverticulosis. 5.  Aortic Atherosclerosis (ICD10-I70.0). Electronically Signed   By: Delbert Phenix M.D.   On: 03/01/2023 18:18     Medical Consultants:   None.   Subjective:    Ralph Sullivan no complaints this morning.  Objective:    Vitals:   03/03/23 0000 03/03/23 0330 03/03/23 0339 03/03/23 0400  BP: 120/79 129/88  125/84  Pulse: 90  70 87  Resp: 20 18 20 17   Temp: 98.4 F (36.9 C)   98.4 F (36.9 C)  TempSrc: Oral   Oral  SpO2: 94%   95%  Weight:      Height:       SpO2: 95 %   Intake/Output Summary (Last 24 hours) at 03/03/2023 0753 Last data filed at 03/02/2023 1801 Gross per 24 hour  Intake 100 ml  Output 1528 ml  Net -1428 ml   Filed Weights   02/25/23 1235 02/27/23 1500  Weight: 70.4 kg 70.4 kg    Exam: General exam: In no acute distress. Respiratory system: Good air movement and clear to auscultation. Cardiovascular system: S1 & S2 heard, RRR. No JVD. Gastrointestinal system: Abdomen is nondistended, soft and nontender.  Extremities: No pedal edema. Skin: No rashes, lesions or ulcers Psychiatry: Judgement  and insight appear normal. Mood & affect appropriate.    Data Reviewed:    Labs: Basic Metabolic Panel: Recent Labs  Lab 02/25/23 1250 02/27/23 0353 02/28/23 0353 03/01/23 0431 03/03/23 0504  NA 131* 130* 131* 131* 129*  K 4.4 4.2 4.5 4.3 4.1  CL 96* 96* 99 101 91*  CO2 22 23 23 26 26   GLUCOSE 122* 101* 116* 94 101*  BUN 20 22 22  20 16   CREATININE 1.48* 1.25* 1.42* 1.38* 1.39*  CALCIUM 9.9 9.4 9.5 9.0 9.4  MG  --  1.7 1.8 1.8 1.7  PHOS  --  3.6 3.2 3.0 3.1   GFR Estimated Creatinine Clearance: 45.7 mL/min (A) (by C-G formula based on SCr of 1.39 mg/dL (H)). Liver Function Tests: Recent Labs  Lab 02/25/23 1250 02/27/23 0353 03/03/23 0504  AST 21  --  21  ALT 13  --  12  ALKPHOS 66  --  53  BILITOT 0.9  --  0.8  PROT 6.7  --  6.7  ALBUMIN 3.8 3.2* 3.2*   No results for input(s): "LIPASE", "AMYLASE" in the last 168 hours. No results for input(s): "AMMONIA" in the last 168 hours. Coagulation profile Recent Labs  Lab 02/27/23 0353 02/28/23 0353 03/01/23 0431 03/02/23 0725 03/03/23 0503  INR 2.5* 1.9* 2.3* 2.7* 2.6*   COVID-19 Labs  No results for input(s): "DDIMER", "FERRITIN", "LDH", "CRP" in the last 72 hours.  Lab Results  Component Value Date   SARSCOV2NAA NEGATIVE 02/25/2023    CBC: Recent Labs  Lab 02/25/23 1250 02/27/23 0353 02/28/23 0353 03/01/23 0431 03/03/23 0504  WBC 7.7 6.1 5.9 4.8 6.7  NEUTROABS  --   --   --   --  5.1  HGB 12.1* 11.7* 11.5* 10.9* 12.6*  HCT 36.2* 34.9* 34.8* 32.4* 38.3*  MCV 98.4 98.0 98.9 100.0 99.2  PLT 181 158 163 148* 168   Cardiac Enzymes: No results for input(s): "CKTOTAL", "CKMB", "CKMBINDEX", "TROPONINI" in the last 168 hours. BNP (last 3 results) No results for input(s): "PROBNP" in the last 8760 hours. CBG: Recent Labs  Lab 02/25/23 1250  GLUCAP 124*   D-Dimer: No results for input(s): "DDIMER" in the last 72 hours. Hgb A1c: No results for input(s): "HGBA1C" in the last 72 hours. Lipid Profile: No results for input(s): "CHOL", "HDL", "LDLCALC", "TRIG", "CHOLHDL", "LDLDIRECT" in the last 72 hours. Thyroid function studies: No results for input(s): "TSH", "T4TOTAL", "T3FREE", "THYROIDAB" in the last 72 hours.  Invalid input(s): "FREET3" Anemia work up: No results for input(s): "VITAMINB12", "FOLATE", "FERRITIN", "TIBC", "IRON",  "RETICCTPCT" in the last 72 hours. Sepsis Labs: Recent Labs  Lab 02/27/23 0353 02/28/23 0353 03/01/23 0431 03/03/23 0504  WBC 6.1 5.9 4.8 6.7   Microbiology Recent Results (from the past 240 hour(s))  Resp panel by RT-PCR (RSV, Flu A&B, Covid) Anterior Nasal Swab     Status: None   Collection Time: 02/25/23  6:39 PM   Specimen: Anterior Nasal Swab  Result Value Ref Range Status   SARS Coronavirus 2 by RT PCR NEGATIVE NEGATIVE Final   Influenza A by PCR NEGATIVE NEGATIVE Final   Influenza B by PCR NEGATIVE NEGATIVE Final    Comment: (NOTE) The Xpert Xpress SARS-CoV-2/FLU/RSV plus assay is intended as an aid in the diagnosis of influenza from Nasopharyngeal swab specimens and should not be used as a sole basis for treatment. Nasal washings and aspirates are unacceptable for Xpert Xpress SARS-CoV-2/FLU/RSV testing.  Fact Sheet for Patients: BloggerCourse.com  Fact Sheet  for Healthcare Providers: SeriousBroker.it  This test is not yet approved or cleared by the [place] and has been authorized for detection and/or diagnosis of SARS-CoV-2 by FDA under an Emergency Use Authorization (EUA). This EUA will remain in effect (meaning this test can be used) for the duration of the COVID-19 declaration under Section 564(b)(1) of the Act, 21 U.S.C. section 360bbb-3(b)(1), unless the authorization is terminated or revoked.     Resp Syncytial Virus by PCR NEGATIVE NEGATIVE Final    Comment: (NOTE) Fact Sheet for Patients: BloggerCourse.com  Fact Sheet for Healthcare Providers: SeriousBroker.it  This test is not yet approved or cleared by the [place] FDA and has been authorized for detection and/or diagnosis of SARS-CoV-2 by FDA under an Emergency Use Authorization (EUA). This EUA will remain in effect (meaning this test can be used) for the duration of the COVID-19  declaration under Section 564(b)(1) of the Act, 21 U.S.C. section 360bbb-3(b)(1), unless the authorization is terminated or revoked.  Performed at [facility], [address], [place], [place] [address]   Culture, blood (Routine X 2) w Reflex to ID Panel     Status: None (Preliminary result)   Collection Time: 03/01/23  5:58 PM   Specimen: BLOOD  Result Value Ref Range Status   Specimen Description BLOOD LEFT ANTECUBITAL  Final   Special Requests   Final    BOTTLES DRAWN AEROBIC AND ANAEROBIC Blood Culture adequate volume   Culture  Setup Time   Final    GRAM POSITIVE COCCI IN PAIRS AND CHAINS IN BOTH AEROBIC AND ANAEROBIC BOTTLES CRITICAL RESULT CALLED TO, READ BACK BY AND VERIFIED WITH: PHARMD J.FRENS AT 1125 ON 03/02/2023 BY T.SAAD. Performed at [facility], [address], [place], [place] [address]    Culture GRAM POSITIVE COCCI  Final   Report Status PENDING  Incomplete  Blood Culture ID Panel (Reflexed)     Status: Abnormal   Collection Time: 03/01/23  5:58 PM  Result Value Ref Range Status   Enterococcus faecalis DETECTED (A) NOT DETECTED Final    Comment: CRITICAL RESULT CALLED TO, READ BACK BY AND VERIFIED WITH: PHARMD J.FRENS AT 1125 ON 03/02/2023 BY T.SAAD.    Enterococcus Faecium NOT DETECTED NOT DETECTED Final   Listeria monocytogenes NOT DETECTED NOT DETECTED Final   Staphylococcus species DETECTED (A) NOT DETECTED Final    Comment: CRITICAL RESULT CALLED TO, READ BACK BY AND VERIFIED WITH: PHARMD J.FRENS AT 1125 ON 03/02/2023 BY T.SAAD.    Staphylococcus aureus (BCID) NOT DETECTED NOT DETECTED Final   Staphylococcus epidermidis DETECTED (A) NOT DETECTED Final    Comment: CRITICAL RESULT CALLED TO, READ BACK BY AND VERIFIED WITH: PHARMD J.FRENS AT 1125 ON 03/02/2023 BY T.SAAD.    Staphylococcus lugdunensis NOT DETECTED NOT DETECTED Final   Streptococcus species NOT DETECTED NOT DETECTED Final   Streptococcus agalactiae NOT DETECTED NOT DETECTED  Final   Streptococcus pneumoniae NOT DETECTED NOT DETECTED Final   Streptococcus pyogenes NOT DETECTED NOT DETECTED Final   A.calcoaceticus-baumannii NOT DETECTED NOT DETECTED Final   Bacteroides fragilis NOT DETECTED NOT DETECTED Final   Enterobacterales NOT DETECTED NOT DETECTED Final   Enterobacter cloacae complex NOT DETECTED NOT DETECTED Final   Escherichia coli NOT DETECTED NOT DETECTED Final   Klebsiella aerogenes NOT DETECTED NOT DETECTED Final   Klebsiella oxytoca NOT DETECTED NOT DETECTED Final   Klebsiella pneumoniae NOT DETECTED NOT DETECTED Final   Proteus species NOT DETECTED NOT DETECTED Final  Salmonella species NOT DETECTED NOT DETECTED Final   Serratia marcescens NOT DETECTED NOT DETECTED Final   Haemophilus influenzae NOT DETECTED NOT DETECTED Final   Neisseria meningitidis NOT DETECTED NOT DETECTED Final   Pseudomonas aeruginosa NOT DETECTED NOT DETECTED Final   Stenotrophomonas maltophilia NOT DETECTED NOT DETECTED Final   Candida albicans NOT DETECTED NOT DETECTED Final   Candida auris NOT DETECTED NOT DETECTED Final   Candida glabrata NOT DETECTED NOT DETECTED Final   Candida krusei NOT DETECTED NOT DETECTED Final   Candida parapsilosis NOT DETECTED NOT DETECTED Final   Candida tropicalis NOT DETECTED NOT DETECTED Final   Cryptococcus neoformans/gattii NOT DETECTED NOT DETECTED Final   Methicillin resistance mecA/C NOT DETECTED NOT DETECTED Final   Vancomycin resistance NOT DETECTED NOT DETECTED Final    Comment: Performed at [facility], [address], [place], [place] [address]  Culture, blood (Routine X 2) w Reflex to ID Panel     Status: None (Preliminary result)   Collection Time: 03/01/23  6:07 PM   Specimen: BLOOD LEFT ARM  Result Value Ref Range Status   Specimen Description BLOOD LEFT ARM  Final   Special Requests   Final    BOTTLES DRAWN AEROBIC AND ANAEROBIC Blood Culture adequate volume   Culture  Setup Time   Final    GRAM POSITIVE  COCCI IN CHAINS IN BOTH AEROBIC AND ANAEROBIC BOTTLES CRITICAL VALUE NOTED.  VALUE IS CONSISTENT WITH PREVIOUSLY REPORTED AND CALLED VALUE. Performed at [facility], [address] [place], [place] [address]    Culture GRAM POSITIVE COCCI  Final   Report Status PENDING  Incomplete     Medications:    fluticasone  2 spray Each Nare Daily   lidocaine  1 patch Transdermal Q24H   metoprolol tartrate  12.5 mg Oral BID   pantoprazole (PROTONIX) IV  40 mg Intravenous Q24H   Warfarin - Pharmacist Dosing Inpatient   Does not apply q1600   Continuous Infusions:  ampicillin (OMNIPEN) IV 2 g (03/03/23 0519)      LOS: 3 days   Marinda Elk  [facility]  03/03/2023, 7:53 AM

## 2023-03-03 NOTE — Progress Notes (Signed)
PHARMACY - ANTICOAGULATION CONSULT NOTE  Pharmacy Consult for Warfarin Indication: atrial fibrillation  Allergies  Allergen Reactions   Allopurinol Other (See Comments)   Colchicine Other (See Comments)   Duloxetine Other (See Comments)    More nervous   Elemental Sulfur Nausea And Vomiting   Oxycontin [Oxycodone Hcl] Nausea And Vomiting    Patient Measurements: Height: 6' (182.9 cm) Weight: 70.4 kg (155 lb 3.3 oz) IBW/kg (Calculated) : 77.6 Heparin dosing weight: 70.4 kg  Vital Signs: Temp: 98.6 F (37 C) (11/06 0818) Temp Source: Oral (11/06 0818) BP: 138/85 (11/06 0818) Pulse Rate: 86 (11/06 0818)  Labs: Recent Labs    02/28/23 1337 02/28/23 2056 03/01/23 0431 03/02/23 0725 03/03/23 0503 03/03/23 0504  HGB  --   --  10.9*  --   --  12.6*  HCT  --   --  32.4*  --   --  38.3*  PLT  --   --  148*  --   --  168  LABPROT  --   --  25.6* 28.7* 27.9*  --   INR  --   --  2.3* 2.7* 2.6*  --   HEPARINUNFRC <0.10* 0.38 0.47  --   --   --   CREATININE  --   --  1.38*  --   --  1.39*    Estimated Creatinine Clearance: 45.7 mL/min (A) (by C-G formula based on SCr of 1.39 mg/dL (H)).   Medical History: Past Medical History:  Diagnosis Date   Atrial fibrillation (HCC) 02/08/2013   CHADS-VAS - 2 (AGE, HTN). On anticoagulation. Holter 6/14 - 2.3 sec pause. Avg HR 87. 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 Pt has hx AF and CVA and is on warfarin PTA. Warfarin last dose given 11/2, then held with possible SBO. Warfarin resumed 11/5  as there are no plans for procedures  -INR= 2.6 -he is noted on IV antibiotics for possible bacteremia  Home warfarin dose: 5mg  daily (per patient)  Goal of Therapy:  Heparin level 0.3-0.7 units/mL INR 2-3 Monitor platelets by anticoagulation protocol: Yes   Plan:  -Warfarin 5mg  po today -Daily PT/INR  Harland German, PharmD Clinical Pharmacist **Pharmacist phone directory can now be found on [email or web address] ([email or web address]).  Listed under [facility].

## 2023-03-03 NOTE — Progress Notes (Signed)
Subjective: Patient passing a lot of flatus.  No BM.  No nausea.  Abdominal pain improved.  Tolerating FLD well with no issues  ROS: See above, otherwise other systems negative  Objective: Vital signs in last 24 hours: Temp:  [98.4 F (36.9 C)-98.6 F (37 C)] 98.6 F (37 C) (11/06 0818) Pulse Rate:  [70-100] 86 (11/06 0818) Resp:  [17-20] 17 (11/06 0400) BP: (120-175)/(70-120) 138/85 (11/06 0818) SpO2:  [94 %-95 %] 95 % (11/06 0400) Last BM Date : 03/01/23  Intake/Output from previous day: 11/05 0701 - 11/06 0700 In: 100 [IV Piggyback:100] Out: 1528 [Urine:1528] Intake/Output this shift: No intake/output data recorded.  PE: Gen: NAD Abd: soft, ND, NT, +BS  Lab Results:  Recent Labs    03/01/23 0431 03/03/23 0504  WBC 4.8 6.7  HGB 10.9* 12.6*  HCT 32.4* 38.3*  PLT 148* 168   BMET Recent Labs    03/01/23 0431 03/03/23 0504  NA 131* 129*  K 4.3 4.1  CL 101 91*  CO2 26 26  GLUCOSE 94 101*  BUN 20 16  CREATININE 1.38* 1.39*  CALCIUM 9.0 9.4   PT/INR Recent Labs    03/02/23 0725 03/03/23 0503  LABPROT 28.7* 27.9*  INR 2.7* 2.6*   CMP     Component Value Date/Time   NA 129 (L) 03/03/2023 0504   NA 128 (L) 10/28/2022 0916   K 4.1 03/03/2023 0504   CL 91 (L) 03/03/2023 0504   CO2 26 03/03/2023 0504   GLUCOSE 101 (H) 03/03/2023 0504   BUN 16 03/03/2023 0504   BUN 24 10/28/2022 0916   CREATININE 1.39 (H) 03/03/2023 0504   CALCIUM 9.4 03/03/2023 0504   PROT 6.7 03/03/2023 0504   ALBUMIN 3.2 (L) 03/03/2023 0504   AST 21 03/03/2023 0504   ALT 12 03/03/2023 0504   ALKPHOS 53 03/03/2023 0504   BILITOT 0.8 03/03/2023 0504   GFRNONAA 53 (L) 03/03/2023 0504   GFRAA 59 (L) 10/19/2017 0824   Lipase  No results found for: "LIPASE"     Studies/Results: DG CHEST PORT 1 VIEW  Result Date: 03/01/2023 CLINICAL DATA:  Fever EXAM: PORTABLE CHEST 1 VIEW COMPARISON:  02/25/2023 FINDINGS: The heart size and mediastinal contours are within normal  limits. Both lungs are clear. The visualized skeletal structures are unremarkable. IMPRESSION: No active disease. Electronically Signed   By: Helyn Numbers M.D.   On: 03/01/2023 21:55   CT ABDOMEN PELVIS WO CONTRAST  Result Date: 03/01/2023 CLINICAL DATA:  Inpatient. Follow-up small bowel obstruction. Acute abdominal pain. EXAM: CT ABDOMEN AND PELVIS WITHOUT CONTRAST TECHNIQUE: Multidetector CT imaging of the abdomen and pelvis was performed following the standard protocol without IV contrast. RADIATION DOSE REDUCTION: This exam was performed according to the departmental dose-optimization program which includes automated exposure control, adjustment of the mA and/or kV according to patient size and/or use of iterative reconstruction technique. COMPARISON:  02/27/2023 CT abdomen/pelvis. FINDINGS: Portions of the upper abdomen including superior liver, spleen and stomach are inadvertently excluded from the study. Lower chest: No acute abnormality. Hepatobiliary: Normal liver size. No liver mass. Cholelithiasis. No gallbladder distention, gallbladder wall thickening or pericholecystic fluid. No biliary ductal dilatation. Pancreas: Normal, with no mass or duct dilation. Spleen: Normal size. No mass. Adrenals/Urinary Tract: Normal adrenals. Nonobstructing 7 mm and 2 mm mid to upper right renal stones. No right hydronephrosis. New mild left hydroureteronephrosis to the level of the upper left pelvic ureter. A few tiny nonobstructing left renal  stones, largest 2 mm in the upper left kidney. Numerous small simple bilateral renal cysts and subcentimeter hypodense bilateral renal cortical lesions that are too small to characterize, largest 3.0 cm in the lower left kidney, for which no follow-up imaging is recommended. Normal caliber right ureter. No ureteral stones. Chronic mild diffuse bladder wall thickening. No bladder stones. Stomach/Bowel: Normal non-distended stomach. Oral contrast transits to the cecum. Normal  appendix with oral contrast within the lumen of the appendix. A few persistent mildly dilated mid to distal small bowel loops up to 3.4 cm diameter, improved. Previously described transition point in the right abdomen is no longer discretely visualized. No definite small bowel wall thickening. No pneumatosis. Mild sigmoid diverticulosis with no large bowel wall thickening or significant pericolonic fat stranding. Vascular/Lymphatic: Atherosclerotic nonaneurysmal abdominal aorta. No pathologically enlarged lymph nodes in the abdomen or pelvis. Reproductive: Normal size prostate. No pneumoperitoneum. Trace deep pelvic ascites is similar. No focal fluid collection. Other: No pneumoperitoneum, ascites or focal fluid collection. Musculoskeletal: No aggressive appearing focal osseous lesions. Marked multilevel lumbar degenerative disc disease. Partially visualized fixation hardware in the proximal left femur. IMPRESSION: 1. Resolving partial distal small bowel obstruction. Oral contrast transits to the cecum. Previously described transition point in the right abdomen is no longer discretely visualized. Mildly dilated small bowel loops are improved. 2. Nonobstructing bilateral nephrolithiasis. New mild left hydroureteronephrosis to the level of the upper left pelvic ureter, with no ureteral or bladder stones. Chronic mild diffuse bladder wall thickening. 3. Cholelithiasis. 4. Mild sigmoid diverticulosis. 5.  Aortic Atherosclerosis (ICD10-I70.0). Electronically Signed   By: Delbert Phenix M.D.   On: 03/01/2023 18:18    Anti-infectives: Anti-infectives (From admission, onward)    Start     Dose/Rate Route Frequency Ordered Stop   03/02/23 1230  ampicillin (OMNIPEN) 2 g in sodium chloride 0.9 % 100 mL IVPB  Status:  Discontinued        2 g 300 mL/hr over 20 Minutes Intravenous Every 4 hours 03/02/23 1133 03/02/23 1139   03/02/23 1230  ampicillin (OMNIPEN) 2 g in sodium chloride 0.9 % 100 mL IVPB        2 g 300 mL/hr  over 20 Minutes Intravenous Every 6 hours 03/02/23 1140     03/02/23 0000  cefTRIAXone (ROCEPHIN) 1 g in sodium chloride 0.9 % 100 mL IVPB  Status:  Discontinued       Note to Pharmacy: Start after urine culture collected   1 g 200 mL/hr over 30 Minutes Intravenous Every 24 hours 03/01/23 2312 03/02/23 1133        Assessment/Plan pSBO, unclear etiology - tolerating FLD, adv to regular diet -? If this was more ileus related secondary to bacteremia and possible urinary source?  No clear masses and likely virgin abdomen so less likely adhesive in origin.   -no acute surgical indications -never had a colonoscopy.  Needs GI follow up as an outpatient for screening colonoscopy -no further surgical needs at this time.  We will sign off, but are available as needed  FEN - regular VTE - coumadin  ID - none currently needed  A fib CKD Gout H/o CVA Mild hydroureteronephrosis - per medicine, urine cx pending Enterococcus bacteremia - per medicine/ID.  No infectious source noted related to bowel.  I reviewed hospitalist notes, last 24 h vitals and pain scores, last 48 h intake and output, last 24 h labs and trends, and last 24 h imaging results.   LOS: 3 days  Letha Cape , [facility] 03/03/2023, 9:32 AM Please see Amion for pager number during day hours 7:00am-4:30pm or 7:00am -11:30am on weekends

## 2023-03-03 NOTE — TOC Progression Note (Signed)
Transition of Care [facility]) - Progression Note    Patient Details  Name: Ralph Sullivan MRN: [id number] Date of Birth: [birth year]  [facility] of [facility]) CM/SW Contact  Delilah Shan, LCSWA Phone Number: 03/03/2023, 11:50 AM  Clinical Narrative:       French Ana with [facility] informed CSW that patients insurance authorization is still pending for SNF. French Ana informed CSW that she submitted updated progress notes. CSW will continue to follow and assist with patients dc planning needs.   Expected Discharge Plan: Skilled Nursing Facility Barriers to Discharge: Continued Medical Work up  Expected Discharge Plan and Services       Living arrangements for the past 2 months: Single Family Home                                       Social Determinants of Health (SDOH) Interventions SDOH Screenings   Food Insecurity: No Food Insecurity (02/27/2023)  Housing: Low Risk  (02/27/2023)  Transportation Needs: No Transportation Needs (02/27/2023)  Utilities: Not At Risk (02/27/2023)  Tobacco Use: Medium Risk (02/25/2023)    Readmission Risk Interventions     No data to display

## 2023-03-03 NOTE — Plan of Care (Signed)
Patient has been running a low grade fever today. Not requiring tylenol at this moment. Patient was sitting in the chair today. During his transfer, heart rate went from 90s to 180s. Patient was symptomatic briefly but it resolved itself. Will continue to monitor patient.

## 2023-03-03 NOTE — Progress Notes (Signed)
Echocardiogram 2D Echocardiogram has been performed.  Warren Lacy Royalty Fakhouri RDCS 03/03/2023, 1:52 PM

## 2023-03-03 NOTE — Progress Notes (Signed)
03/03/23 1100  PT Visit Information  Last PT Received On 03/03/23  Assistance Needed +1  History of Present Illness The pt is a 77 yo male presenting 10/31 with generalized weakness in BLE. PMH includes: atrial fibrillation on metoprolol and warfarin, hyperhidrosis of the hands, and CKD.  Subjective Data  Patient Stated Goal improve endurance and return home  Precautions  Precautions Fall  Precaution Comments watch HR and BP  Restrictions  Weight Bearing Restrictions No  Pain Assessment  Pain Assessment Faces  Faces Pain Scale 6  Pain Location low back  Pain Descriptors / Indicators Constant;Aching  Pain Intervention(s) Limited activity within patient's tolerance;Monitored during session;Repositioned  Cognition  Arousal Alert  Behavior During Therapy WFL for tasks assessed/performed  Overall Cognitive Status Within Functional Limits for tasks assessed  Bed Mobility  Overal bed mobility Needs Assistance  Sit to supine Min assist  General bed mobility comments Needed a little assist to get LEs initiated into bed.  Transfers  Overall transfer level Needs assistance  Equipment used Rolling walker (2 wheels)  Transfers Sit to/from Stand;Bed to chair/wheelchair/BSC  Sit to Stand Mod assist;+2 physical assistance;From elevated surface  Step pivot transfers Mod assist;+2 physical assistance  General transfer comment Nurse asked PT if this PT could help her get pt back into bed.  Again, LE's very weak and ataxic when up to feet needing +2 for sit to stand and transfer to bed  Pt shaky and weak with poor postural stability in standing.  Upon standing, pt HR again jumped from 100 bpm up to 180 bpm.  HR stabilized once in bed.  Balance  Overall balance assessment Needs assistance  Sitting-balance support No upper extremity supported;Feet supported  Sitting balance-Leahy Scale Fair  Standing balance support Bilateral upper extremity supported;During functional activity  Standing  balance-Leahy Scale Poor  Standing balance comment BUE support and assist of 2  General Comments  General comments (skin integrity, edema, etc.) HR to 180 bpm during transfer but stabilized quickly.  Exercises  Exercises General Lower Extremity  General Exercises - Lower Extremity  Ankle Circles/Pumps AROM;Both;10 reps  Long Arc Quad AROM;Both;10 reps;Seated  PT - End of Session  Equipment Utilized During Treatment Gait belt  Activity Tolerance Treatment limited secondary to medical complications (Comment) (HR incr with little activity)  Patient left with call bell/phone within reach;in bed;with bed alarm set  Nurse Communication Other (comment);Mobility status (HR incr)   PT - Assessment/Plan  PT Visit Diagnosis Unsteadiness on feet (R26.81);Other abnormalities of gait and mobility (R26.89);Muscle weakness (generalized) (M62.81)  PT Frequency (ACUTE ONLY) Min 1X/week  Recommendations for Other Services OT consult  Follow Up Recommendations Skilled nursing-short term rehab (<3 hours/day)  Can patient physically be transported by private vehicle No  Patient can return home with the following A lot of help with walking and/or transfers;A lot of help with bathing/dressing/bathroom;Assistance with cooking/housework;Direct supervision/assist for medications management;Direct supervision/assist for financial management;Assist for transportation;Help with stairs or ramp for entrance  PT equipment None recommended by PT  AM-PAC PT "6 Clicks" Mobility Outcome Measure (Version 2)  Help needed turning from your back to your side while in a flat bed without using bedrails? 1  Help needed moving from lying on your back to sitting on the side of a flat bed without using bedrails? 1  Help needed moving to and from a bed to a chair (including a wheelchair)? 1  Help needed standing up from a chair using your arms (e.g., wheelchair or bedside chair)?  1  Help needed to walk in hospital room? 1  Help  needed climbing 3-5 steps with a railing?  1  6 Click Score 6  Consider Recommendation of Discharge To: CIR/SNF/LTACH  Progressive Mobility  What is the highest level of mobility based on the progressive mobility assessment? Level 3 (Stands with assist) - Balance while standing  and cannot march in place  Mobility Referral Yes (monitor HR)  Activity Transferred from chair to bed  PT Goal Progression  Progress towards PT goals Not progressing toward goals - comment (Incr HR is limiting pt)  PT Time Calculation  PT Start Time (ACUTE ONLY) 1032  PT Stop Time (ACUTE ONLY) 1044  PT Time Calculation (min) (ACUTE ONLY) 12 min  PT General Charges  $$ ACUTE PT VISIT 1 Visit  PT Treatments  $Therapeutic Activity 8-22 mins  Nurse asked PT to assist pt back to bed.  Pt without change still needing mod assist of 2 persons as well as HR incr to 180 bpm.  Nurse aware and will continue to progress pt as able.  Ash Mcelwain M,PT Acute Rehab Services [phone]

## 2023-03-03 NOTE — Plan of Care (Signed)

## 2023-03-04 DIAGNOSIS — G6289 Other specified polyneuropathies: Secondary | ICD-10-CM

## 2023-03-04 DIAGNOSIS — R7881 Bacteremia: Secondary | ICD-10-CM | POA: Diagnosis not present

## 2023-03-04 DIAGNOSIS — I4811 Longstanding persistent atrial fibrillation: Secondary | ICD-10-CM | POA: Diagnosis not present

## 2023-03-04 DIAGNOSIS — R531 Weakness: Secondary | ICD-10-CM | POA: Diagnosis not present

## 2023-03-04 LAB — URINE CULTURE: Culture: >=100000 — AB

## 2023-03-04 LAB — PROTIME-INR
INR: 3.5 — ABNORMAL HIGH (ref 0.8–1.2)
Prothrombin Time: 35.7 s — ABNORMAL HIGH (ref 11.4–15.2)

## 2023-03-04 LAB — CULTURE, BLOOD (ROUTINE X 2)
Special Requests: ADEQUATE
Special Requests: ADEQUATE

## 2023-03-04 NOTE — Progress Notes (Signed)
Pt had 2 episodes of ST in the 180's. See MAR. Pt n/v with 1 episode of vomiting with the ST episodes.

## 2023-03-04 NOTE — Progress Notes (Signed)
PHARMACY - ANTICOAGULATION CONSULT NOTE  Pharmacy Consult for Warfarin Indication: atrial fibrillation  Allergies  Allergen Reactions   Allopurinol Other (See Comments)   Colchicine Other (See Comments)   Duloxetine Other (See Comments)    More nervous   Elemental Sulfur Nausea And Vomiting   Oxycontin [Oxycodone Hcl] Nausea And Vomiting    Patient Measurements: Height: 6' (182.9 cm) Weight: 70.4 kg (155 lb 3.3 oz) IBW/kg (Calculated) : 77.6 Heparin dosing weight: 70.4 kg  Vital Signs: Temp: 97.7 F (36.5 C) (11/07 0859) Temp Source: Oral (11/07 0859) BP: 111/81 (11/07 0859) Pulse Rate: 88 (11/07 0859)  Labs: Recent Labs    03/02/23 0725 03/03/23 0503 03/03/23 0504 03/04/23 0508  HGB  --   --  12.6*  --   HCT  --   --  38.3*  --   PLT  --   --  168  --   LABPROT 28.7* 27.9*  --  35.7*  INR 2.7* 2.6*  --  3.5*  CREATININE  --   --  1.39*  --     Estimated Creatinine Clearance: 45.7 mL/min (A) (by C-G formula based on SCr of 1.39 mg/dL (H)).   Medical History: Past Medical History:  Diagnosis Date   Atrial fibrillation (HCC) 02/08/2013   CHADS-VAS - 2 (AGE, HTN). On anticoagulation. Holter 6/14 - 2.3 sec pause. Avg HR 87. 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 Pt has hx AF and CVA and is on warfarin PTA. Warfarin last dose given 11/2, then held with possible SBO. Warfarin resumed 11/5 as there are no plans for procedures  -INR= 2.6> 3.5 -increased warfarin sensitivity possibly due to recent SBO (low po intake) and  antibiotics  Home warfarin dose: 5mg  daily (per patient)  Goal of Therapy:  Heparin level 0.3-0.7 units/mL INR 2-3 Monitor platelets by anticoagulation protocol: Yes   Plan:  -hold warfarin today -Daily PT/INR  Harland German, PharmD Clinical Pharmacist **Pharmacist phone directory can now be found on [email or web address] ([email or web address]).  Listed under [facility].

## 2023-03-04 NOTE — Progress Notes (Signed)
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 A few tiny nonobstructing left renal stones, largest 2 mm in the upper left kidney. Cr stable at 1.39  # pSBO/small bowel lesion - thought to be ileus in the setting of infection, evaluated by surgery   Recommendations - Continue ampicillin, no need to broaden as clinically improving on current regimen - Fu repeat blood cx for clearance  - TEE ordered  - Monitor CBC and BMP on antibiotics - Final recommendations pending TEE  Rest of the management as per the primary team. Thank you for the consult. Please page with pertinent questions or concerns.  ______________________________________________________________________ Subjective patient seen and examined at the bedside.  He feels same.  Feels  hungry, passing gases, dysuria has resolved  Vitals BP 111/81 (BP Location: Right Arm)   Pulse 88   Temp 97.7 F (36.5 C) (Oral)   Resp 16   Ht 6' (1.829 m)   Wt 70.4 kg   SpO2 95%   BMI 21.05 kg/m     Physical Exam Constitutional: Elderly male lying in the bed and appears nontoxic    Comments: HEENT WNL  Cardiovascular:     Rate and Rhythm: Normal rate and regular rhythm.     Heart sounds: s1s2  Pulmonary:     Effort: Pulmonary effort is normal.     Comments: Normal breath sounds  Abdominal:     Palpations: Abdomen is soft.     Tenderness: Nontender and nondistended  Musculoskeletal:        General: No swelling or tenderness in peripheral joints no  Skin:    Comments: No rashes  Neurological:     General: Awake, alert and oriented, following commands  Psychiatric:        Mood and Affect: Mood normal.   Pertinent Microbiology Results for orders placed or performed during the hospital encounter of 02/25/23  Resp panel by RT-PCR (RSV, Flu A&B, Covid) Anterior Nasal Swab     Status: None   Collection Time: 02/25/23  6:39 PM   Specimen: Anterior Nasal Swab  Result Value Ref Range Status   SARS Coronavirus 2 by RT PCR NEGATIVE NEGATIVE Final   Influenza A by PCR NEGATIVE NEGATIVE Final   Influenza B by PCR NEGATIVE NEGATIVE Final    Comment: (NOTE) The Xpert Xpress SARS-CoV-2/FLU/RSV plus assay is intended as an aid in the diagnosis  of influenza from Nasopharyngeal swab specimens and should not be used as a sole basis for treatment. Nasal washings and aspirates are unacceptable for Xpert Xpress SARS-CoV-2/FLU/RSV testing.  Fact Sheet for Patients: BloggerCourse.com  Fact Sheet for Healthcare Providers: SeriousBroker.it  This test is not yet approved or cleared by the [place] FDA and has been authorized for detection and/or diagnosis of SARS-CoV-2 by FDA under an Emergency Use Authorization (EUA). This  EUA will remain in effect (meaning this test can be used) for the duration of the COVID-19 declaration under Section 564(b)(1) of the Act, 21 U.S.C. section 360bbb-3(b)(1), unless the authorization is terminated or revoked.     Resp Syncytial Virus by PCR NEGATIVE NEGATIVE Final    Comment: (NOTE) Fact Sheet for Patients: BloggerCourse.com  Fact Sheet for Healthcare Providers: SeriousBroker.it  This test is not yet approved or cleared by the [place] FDA and has been authorized for detection and/or diagnosis of SARS-CoV-2 by FDA under an Emergency Use Authorization (EUA). This EUA will remain in effect (meaning this test can be used) for the duration of the COVID-19 declaration under Section 564(b)(1) of the Act, 21 U.S.C. section 360bbb-3(b)(1), unless the authorization is terminated or revoked.  Performed at [facility], [address] [place], [place] [address]   Culture, blood (Routine X 2) w Reflex to ID Panel     Status: Abnormal   Collection Time: 03/01/23  5:58 PM   Specimen: BLOOD  Result Value Ref Range Status   Specimen Description BLOOD LEFT ANTECUBITAL  Final   Special Requests   Final    BOTTLES DRAWN AEROBIC AND ANAEROBIC Blood Culture adequate volume   Culture  Setup Time   Final    GRAM POSITIVE COCCI IN PAIRS AND CHAINS IN BOTH AEROBIC AND ANAEROBIC BOTTLES CRITICAL RESULT CALLED TO, READ BACK BY AND VERIFIED WITH: PHARMD J.FRENS AT 1125 ON 03/02/2023 BY T.SAAD.    Culture (A)  Final    ENTEROCOCCUS FAECALIS SUSCEPTIBILITIES PERFORMED ON PREVIOUS CULTURE WITHIN THE LAST 5 DAYS. STAPHYLOCOCCUS EPIDERMIDIS THE SIGNIFICANCE OF ISOLATING THIS ORGANISM FROM A SINGLE SET OF BLOOD CULTURES WHEN MULTIPLE SETS ARE DRAWN IS UNCERTAIN. PLEASE NOTIFY THE MICROBIOLOGY DEPARTMENT WITHIN ONE WEEK IF SPECIATION AND SENSITIVITIES ARE REQUIRED. Performed at [facility], [address], [place], [place]  [address]    Report Status 03/04/2023 FINAL  Final  Blood Culture ID Panel (Reflexed)     Status: Abnormal   Collection Time: 03/01/23  5:58 PM  Result Value Ref Range Status   Enterococcus faecalis DETECTED (A) NOT DETECTED Final    Comment: CRITICAL RESULT CALLED TO, READ BACK BY AND VERIFIED WITH: PHARMD J.FRENS AT 1125 ON 03/02/2023 BY T.SAAD.    Enterococcus Faecium NOT DETECTED NOT DETECTED Final   Listeria monocytogenes NOT DETECTED NOT DETECTED Final   Staphylococcus species DETECTED (A) NOT DETECTED Final    Comment: CRITICAL RESULT CALLED TO, READ BACK BY AND VERIFIED WITH: PHARMD J.FRENS AT 1125 ON 03/02/2023 BY T.SAAD.    Staphylococcus aureus (BCID) NOT DETECTED NOT DETECTED Final   Staphylococcus epidermidis DETECTED (A) NOT DETECTED Final    Comment: CRITICAL RESULT CALLED TO, READ BACK BY AND VERIFIED WITH: PHARMD J.FRENS AT 1125 ON 03/02/2023 BY T.SAAD.    Staphylococcus lugdunensis NOT DETECTED NOT DETECTED Final   Streptococcus species NOT DETECTED NOT DETECTED Final   Streptococcus agalactiae NOT DETECTED NOT DETECTED Final   Streptococcus pneumoniae NOT DETECTED NOT DETECTED Final   Streptococcus pyogenes NOT  DETECTED NOT DETECTED Final   A.calcoaceticus-baumannii NOT DETECTED NOT DETECTED Final   Bacteroides fragilis NOT DETECTED NOT DETECTED Final   Enterobacterales NOT DETECTED NOT DETECTED Final   Enterobacter cloacae complex NOT DETECTED NOT DETECTED Final   Escherichia coli NOT DETECTED NOT DETECTED Final   Klebsiella aerogenes NOT DETECTED NOT DETECTED Final   Klebsiella oxytoca NOT DETECTED NOT DETECTED Final   Klebsiella pneumoniae NOT DETECTED NOT DETECTED Final   Proteus species NOT DETECTED NOT DETECTED Final   Salmonella species NOT DETECTED NOT DETECTED Final   Serratia marcescens NOT DETECTED NOT DETECTED Final   Haemophilus influenzae NOT DETECTED NOT DETECTED Final   Neisseria meningitidis NOT DETECTED NOT DETECTED Final   Pseudomonas  aeruginosa NOT DETECTED NOT DETECTED Final   Stenotrophomonas maltophilia NOT DETECTED NOT DETECTED Final   Candida albicans NOT DETECTED NOT DETECTED Final   Candida auris NOT DETECTED NOT DETECTED Final   Candida glabrata NOT DETECTED NOT DETECTED Final   Candida krusei NOT DETECTED NOT DETECTED Final   Candida parapsilosis NOT DETECTED NOT DETECTED Final   Candida tropicalis NOT DETECTED NOT DETECTED Final   Cryptococcus neoformans/gattii NOT DETECTED NOT DETECTED Final   Methicillin resistance mecA/C NOT DETECTED NOT DETECTED Final   Vancomycin resistance NOT DETECTED NOT DETECTED Final    Comment: Performed at [facility], [address] [place], [place] [address]  Culture, blood (Routine X 2) w Reflex to ID Panel     Status: Abnormal   Collection Time: 03/01/23  6:07 PM   Specimen: BLOOD LEFT ARM  Result Value Ref Range Status   Specimen Description BLOOD LEFT ARM  Final   Special Requests   Final    BOTTLES DRAWN AEROBIC AND ANAEROBIC Blood Culture adequate volume   Culture  Setup Time   Final    GRAM POSITIVE COCCI IN CHAINS IN BOTH AEROBIC AND ANAEROBIC BOTTLES CRITICAL VALUE NOTED.  VALUE IS CONSISTENT WITH PREVIOUSLY REPORTED AND CALLED VALUE. Performed at [facility], [address], [place], [place] [address]    Culture ENTEROCOCCUS FAECALIS (A)  Final   Report Status 03/04/2023 FINAL  Final   Organism ID, Bacteria ENTEROCOCCUS FAECALIS  Final      Susceptibility   Enterococcus faecalis - MIC*    AMPICILLIN <=2 SENSITIVE Sensitive     VANCOMYCIN 1 SENSITIVE Sensitive     GENTAMICIN SYNERGY SENSITIVE Sensitive     * ENTEROCOCCUS FAECALIS  Urine Culture (for pregnant, neutropenic or urologic patients or patients with an indwelling urinary catheter)     Status: Abnormal   Collection Time: 03/01/23 11:30 PM   Specimen: Urine, Clean Catch  Result Value Ref Range Status   Specimen Description URINE, CLEAN CATCH  Final   Special Requests   Final     NONE Performed at [facility], [address] [place], [place] [address]    Culture (A)  Final    >=100,000 COLONIES/mL ENTEROCOCCUS FAECALIS 40,000 COLONIES/mL KLEBSIELLA OXYTOCA    Report Status 03/04/2023 FINAL  Final   Organism ID, Bacteria ENTEROCOCCUS FAECALIS (A)  Final   Organism ID, Bacteria KLEBSIELLA OXYTOCA (A)  Final      Susceptibility   Enterococcus faecalis - MIC*    AMPICILLIN <=2 SENSITIVE Sensitive     NITROFURANTOIN <=16 SENSITIVE Sensitive     VANCOMYCIN 1 SENSITIVE Sensitive     * >=100,000 COLONIES/mL ENTEROCOCCUS FAECALIS   Klebsiella oxytoca - MIC*    AMPICILLIN >=32 RESISTANT Resistant  CEFEPIME <=0.12 SENSITIVE Sensitive     CEFTRIAXONE <=0.25 SENSITIVE Sensitive     CIPROFLOXACIN <=0.25 SENSITIVE Sensitive     GENTAMICIN <=1 SENSITIVE Sensitive     IMIPENEM <=0.25 SENSITIVE Sensitive     NITROFURANTOIN 32 SENSITIVE Sensitive     TRIMETH/SULFA <=20 SENSITIVE Sensitive     AMPICILLIN/SULBACTAM 16 INTERMEDIATE Intermediate     PIP/TAZO <=4 SENSITIVE Sensitive ug/mL    * 40,000 COLONIES/mL KLEBSIELLA OXYTOCA  Culture, blood (Routine X 2) w Reflex to ID Panel     Status: None (Preliminary result)   Collection Time: 03/03/23  5:04 AM   Specimen: BLOOD  Result Value Ref Range Status   Specimen Description BLOOD BLOOD RIGHT ARM  Final   Special Requests   Final    BOTTLES DRAWN AEROBIC AND ANAEROBIC Blood Culture adequate volume   Culture   Final    NO GROWTH 1 DAY Performed at [facility], [address], [place], [place] [address]    Report Status PENDING  Incomplete  Culture, blood (Routine X 2) w Reflex to ID Panel     Status: None (Preliminary result)   Collection Time: 03/03/23  5:06 AM   Specimen: BLOOD  Result Value Ref Range Status   Specimen Description BLOOD BLOOD RIGHT ARM  Final   Special Requests   Final    BOTTLES DRAWN AEROBIC AND ANAEROBIC Blood Culture adequate volume   Culture   Final    NO GROWTH 1  DAY Performed at [facility], [address] [place], [place] [address]    Report Status PENDING  Incomplete   Pertinent Lab.    Latest Ref Rng & Units 03/03/2023    5:04 AM 03/01/2023    4:31 AM 02/28/2023    3:53 AM  CBC  WBC 4.0 - 10.5 K/uL 6.7  4.8  5.9   Hemoglobin 13.0 - 17.0 g/dL 63.0  16.0  10.9   Hematocrit 39.0 - 52.0 % 38.3  32.4  34.8   Platelets 150 - 400 K/uL 168  148  163       Latest Ref Rng & Units 03/03/2023    5:04 AM 03/01/2023    4:31 AM 02/28/2023    3:53 AM  CMP  Glucose 70 - 99 mg/dL 323  94  557   BUN 8 - 23 mg/dL 16  20  22    Creatinine 0.61 - 1.24 mg/dL 3.22  0.25  4.27   Sodium 135 - 145 mmol/L 129  131  131   Potassium 3.5 - 5.1 mmol/L 4.1  4.3  4.5   Chloride 98 - 111 mmol/L 91  101  99   CO2 22 - 32 mmol/L 26  26  23    Calcium 8.9 - 10.3 mg/dL 9.4  9.0  9.5   Total Protein 6.5 - 8.1 g/dL 6.7     Total Bilirubin <1.2 mg/dL 0.8     Alkaline Phos 38 - 126 U/L 53     AST 15 - 41 U/L 21     ALT 0 - 44 U/L 12        Pertinent Imaging today Plain films and CT images have been personally visualized and interpreted; radiology reports have been reviewed. Decision making incorporated into the Impression /   ECHOCARDIOGRAM COMPLETE  Result Date: 03/03/2023    ECHOCARDIOGRAM REPORT   [name] Date of Exam: 03/03/2023 Medical Rec #:  [id number]  Height:       72.0 in Accession #:    [id number]    Weight:       155.2 lb Date of Birth:  Sep 07, 1945      BSA:          1.913 m Patient Age:    77 years      BP:           131/83 mmHg Patient Gender: M             HR:           93 bpm. Exam Location:  Inpatient Procedure: 2D Echo, Color Doppler, Cardiac Doppler and Intracardiac            Opacification Agent Indications:    Bacteremia  History:        Patient has prior history of Echocardiogram examinations, most                 recent 04/30/2022. Arrythmias:Atrial Fibrillation; Risk                 Factors:Hypertension.  Sonographer:    Irving Burton  Senior RDCS Referring Phys: A CALDWELL POWELL JR  Sonographer Comments: Technically difficult due to significant lung interference URI IMPRESSIONS  1. Left ventricular ejection fraction, by estimation, is 45 to 50%. The left ventricle has mildly decreased function. The left ventricle demonstrates global hypokinesis. Left ventricular diastolic parameters are indeterminate.  2. Right ventricular systolic function is mildly reduced. The right ventricular size is normal. Tricuspid regurgitation signal is inadequate for assessing PA pressure.  3. Left atrial size was severely dilated.  4. Right atrial size was mildly dilated.  5. The mitral valve is normal in structure. Trivial mitral valve regurgitation.  6. The aortic valve was not well visualized. Aortic valve regurgitation is trivial. No aortic stenosis is present. Conclusion(s)/Recommendation(s): No vegetation seen but technically difficult study. Consider a transesophageal echocardiogram to exclude infective endocarditis if clinically indicated. FINDINGS  Left Ventricle: Left ventricular ejection fraction, by estimation, is 45 to 50%. The left ventricle has mildly decreased function. The left ventricle demonstrates global hypokinesis. Definity contrast agent was given IV to delineate the left ventricular  endocardial borders. The left ventricular internal cavity size was normal in size. There is no left ventricular hypertrophy. Left ventricular diastolic parameters are indeterminate. Right Ventricle: The right ventricular size is normal. Right vetricular wall thickness was not well visualized. Right ventricular systolic function is mildly reduced. Tricuspid regurgitation signal is inadequate for assessing PA pressure. Left Atrium: Left atrial size was severely dilated. Right Atrium: Right atrial size was mildly dilated. Pericardium: There is no evidence of pericardial effusion. Mitral Valve: The mitral valve is normal in structure. Trivial mitral valve  regurgitation. Tricuspid Valve: The tricuspid valve is normal in structure. Tricuspid valve regurgitation is trivial. Aortic Valve: The aortic valve was not well visualized. Aortic valve regurgitation is trivial. No aortic stenosis is present. Pulmonic Valve: The pulmonic valve was not well visualized. Pulmonic valve regurgitation is trivial. Aorta: The aortic root is normal in size and structure. IAS/Shunts: The interatrial septum was not well visualized.  LEFT VENTRICLE PLAX 2D LVIDd:         5.40 cm LVIDs:         4.30 cm LV PW:         0.90 cm LV IVS:        0.90 cm LVOT diam:     2.50 cm LV SV:  48 LV SV Index:   25 LVOT Area:     4.91 cm  RIGHT VENTRICLE RV S prime:     10.40 cm/s TAPSE (M-mode): 1.8 cm LEFT ATRIUM              Index         RIGHT ATRIUM           Index LA diam:        5.50 cm  2.88 cm/m    RA Area:     24.60 cm LA Vol (A2C):   123.0 ml 64.30 ml/m   RA Volume:   73.80 ml  38.58 ml/m LA Vol (A4C):   203.0 ml 106.12 ml/m LA Biplane Vol: 167.0 ml 87.30 ml/m  AORTIC VALVE LVOT Vmax:   56.80 cm/s LVOT Vmean:  44.150 cm/s LVOT VTI:    0.097 m  AORTA Ao Root diam: 3.80 cm  SHUNTS Systemic VTI:  0.10 m Systemic Diam: 2.50 cm Epifanio Lesches MD Electronically signed by Epifanio Lesches MD Signature Date/Time: 03/03/2023/4:31:12 PM    Final      I have personally spent at least 50 minutes involved in face-to-face and non-face-to-face activities for this patient on the day of the visit. Professional time spent includes the following activities: Preparing to see the patient (review of tests), Obtaining and/or reviewing separately obtained history (admission/discharge record), Performing a medically appropriate examination and/or evaluation , Ordering medications/tests/procedures, referring and communicating with other health care professionals, Documenting clinical information in the EMR, Independently interpreting results (not separately reported), Communicating results to the  patient/family/caregiver, Counseling and educating the patient/family/caregiver and Care coordination (not separately reported).   Plan d/w requesting provider as well as ID pharm D  Of note, portions of this note may have been created with voice recognition software. While this note has been edited for accuracy, occasional wrong-word or 'sound-a-like' substitutions may have occurred due to the inherent limitations of voice recognition software.   Electronically signed by:   Odette Fraction, MD Infectious Disease Physician [facility] Pager: [phone]

## 2023-03-04 NOTE — TOC Progression Note (Signed)
Transition of Care [facility]) - Progression Note    Patient Details  Name: Ralph Sullivan MRN: [id number] Date of Birth: Jun 11, 1945  [facility] CM/SW Contact  Delilah Shan, LCSWA Phone Number: 03/04/2023, 10:47 AM  Clinical Narrative:     Patients insurance authorization currently pending. Patient has SNF bed at [facility] when medically ready. CSW will continue to follow.  Expected Discharge Plan: Skilled Nursing Facility Barriers to Discharge: Continued Medical Work up  Expected Discharge Plan and Services       Living arrangements for the past 2 months: Single Family Home                                       Social Determinants of Health (SDOH) Interventions SDOH Screenings   Food Insecurity: No Food Insecurity (02/27/2023)  Housing: Low Risk  (02/27/2023)  Transportation Needs: No Transportation Needs (02/27/2023)  Utilities: Not At Risk (02/27/2023)  Tobacco Use: Medium Risk (02/25/2023)    Readmission Risk Interventions     No data to display

## 2023-03-04 NOTE — Plan of Care (Signed)

## 2023-03-04 NOTE — Progress Notes (Signed)
[facility] PROGRESS NOTE    Progress Note  [name]  VQQ:[id number] DOB: May 24, 1945 DOA: 02/25/2023 PCP: Lorenda Ishihara, MD     Brief Narrative:   Ralph Sullivan is an 77 y.o. male past medical history of A-fib on Coumadin chronic kidney disease stage IIIa, CVA comes in with generalized weakness complicated by A-fib with RVR and found to have Enterococcus faecalis bacteremia   Assessment/Plan:   Enterococcus faecalis bacteremia: BC ID positive for Enterococcus, surveillance blood culture on 03/02/2025. 2D echo showed an EF of 45%, left ventricular global hypokinesia, no vegetation appreciated. For TEE today.  Urine culture growing 100,000 colonies of Enterococcus faecalis Continue ampicillin.  Culture sensitivities are pending Remains blood culture on 03/02/2023 remained negative till date. Appreciate ID assistance.  Mild left hydroureteronephrosis: Urine cultures showed greater than 100,000 colonies of Enterococcus faecalis. Follow-up with urology as an outpatient.  Small bowel obstruction/small bowel lesion: CT scan of the abdomen pelvis showed partially resolving SBO oral contrast in cecum.  Previously described transition point is no longer appreciated. Surgery on board, have advance diet which she has been tolerated. Resume Coumadin.  A-fib with RVR: Continue metoprolol and Coumadin. Heart rate controlled.  Sinus bradycardia: Clonidine hold. Heart rate now has been fast and metoprolol has been resumed at low-dose okay to increase.  Generalized weakness and deconditioning: Probably related to cardiac condition and age related. PT OT has been consulted recommended skilled nursing facility.  Essential hypertension/chronic systolic heart failure: Clonidine was held as he was having 3-second pauses. Metoprolol was resumed.  Chronic kidney disease stage IIIa: With back creatinine at baseline.   DVT prophylaxis: coumadin Family  Communication:none Status is: Inpatient Remains inpatient appropriate because: Enterococcus faecalis bacteremia    Code Status:     Code Status Orders  (From admission, onward)           Start     Ordered   02/26/23 1526  Full code  Continuous       Question:  By:  Answer:  Consent: discussion documented in EHR   02/26/23 1526           Code Status History     Date Active Date Inactive Code Status Order ID Comments User Context   07/23/2022 1721 07/24/2022 1924 Full Code [id number]  Synetta Fail, MD ED   07/16/2016 0756 07/17/2016 1449 Full Code [id number]  Jerre Simon, PA ED   10/14/2015 2325 10/19/2015 1526 Full Code [id number]  Pearson Grippe, MD Inpatient      Advance Directive Documentation    Flowsheet Row Most Recent Value  Type of Advance Directive Healthcare Power of Attorney, Living will  Pre-existing out of facility DNR order (yellow form or pink MOST form) --  "MOST" Form in Place? --         IV Access:   Peripheral IV   Procedures and diagnostic studies:   ECHOCARDIOGRAM COMPLETE  Result Date: 03/03/2023    ECHOCARDIOGRAM REPORT   [name] Date of Exam: 03/03/2023 Medical Rec #:  [id number]     Height:       72.0 in Accession #:    [id number]    Weight:       155.2 lb Date of Birth:  Nov 01, 1945      BSA:          1.913 m Patient Age:    77 years      BP:  131/83 mmHg Patient Gender: M             HR:           93 bpm. Exam Location:  Inpatient Procedure: 2D Echo, Color Doppler, Cardiac Doppler and Intracardiac            Opacification Agent Indications:    Bacteremia  History:        Patient has prior history of Echocardiogram examinations, most                 recent 04/30/2022. Arrythmias:Atrial Fibrillation; Risk                 Factors:Hypertension.  Sonographer:    Irving Burton Senior RDCS Referring Phys: A CALDWELL POWELL JR  Sonographer Comments: Technically difficult due to significant lung interference URI IMPRESSIONS  1.  Left ventricular ejection fraction, by estimation, is 45 to 50%. The left ventricle has mildly decreased function. The left ventricle demonstrates global hypokinesis. Left ventricular diastolic parameters are indeterminate.  2. Right ventricular systolic function is mildly reduced. The right ventricular size is normal. Tricuspid regurgitation signal is inadequate for assessing PA pressure.  3. Left atrial size was severely dilated.  4. Right atrial size was mildly dilated.  5. The mitral valve is normal in structure. Trivial mitral valve regurgitation.  6. The aortic valve was not well visualized. Aortic valve regurgitation is trivial. No aortic stenosis is present. Conclusion(s)/Recommendation(s): No vegetation seen but technically difficult study. Consider a transesophageal echocardiogram to exclude infective endocarditis if clinically indicated. FINDINGS  Left Ventricle: Left ventricular ejection fraction, by estimation, is 45 to 50%. The left ventricle has mildly decreased function. The left ventricle demonstrates global hypokinesis. Definity contrast agent was given IV to delineate the left ventricular  endocardial borders. The left ventricular internal cavity size was normal in size. There is no left ventricular hypertrophy. Left ventricular diastolic parameters are indeterminate. Right Ventricle: The right ventricular size is normal. Right vetricular wall thickness was not well visualized. Right ventricular systolic function is mildly reduced. Tricuspid regurgitation signal is inadequate for assessing PA pressure. Left Atrium: Left atrial size was severely dilated. Right Atrium: Right atrial size was mildly dilated. Pericardium: There is no evidence of pericardial effusion. Mitral Valve: The mitral valve is normal in structure. Trivial mitral valve regurgitation. Tricuspid Valve: The tricuspid valve is normal in structure. Tricuspid valve regurgitation is trivial. Aortic Valve: The aortic valve was not well  visualized. Aortic valve regurgitation is trivial. No aortic stenosis is present. Pulmonic Valve: The pulmonic valve was not well visualized. Pulmonic valve regurgitation is trivial. Aorta: The aortic root is normal in size and structure. IAS/Shunts: The interatrial septum was not well visualized.  LEFT VENTRICLE PLAX 2D LVIDd:         5.40 cm LVIDs:         4.30 cm LV PW:         0.90 cm LV IVS:        0.90 cm LVOT diam:     2.50 cm LV SV:         48 LV SV Index:   25 LVOT Area:     4.91 cm  RIGHT VENTRICLE RV S prime:     10.40 cm/s TAPSE (M-mode): 1.8 cm LEFT ATRIUM              Index         RIGHT ATRIUM           Index LA  diam:        5.50 cm  2.88 cm/m    RA Area:     24.60 cm LA Vol (A2C):   123.0 ml 64.30 ml/m   RA Volume:   73.80 ml  38.58 ml/m LA Vol (A4C):   203.0 ml 106.12 ml/m LA Biplane Vol: 167.0 ml 87.30 ml/m  AORTIC VALVE LVOT Vmax:   56.80 cm/s LVOT Vmean:  44.150 cm/s LVOT VTI:    0.097 m  AORTA Ao Root diam: 3.80 cm  SHUNTS Systemic VTI:  0.10 m Systemic Diam: 2.50 cm Epifanio Lesches MD Electronically signed by Epifanio Lesches MD Signature Date/Time: 03/03/2023/4:31:12 PM    Final      Medical Consultants:   None.   Subjective:    Ralph Sullivan no new complaints.  Objective:    Vitals:   03/04/23 0129 03/04/23 0144 03/04/23 0405 03/04/23 0521  BP: 134/80 121/68 118/85 (!) 119/92  Pulse:  77 83 93  Resp: 20 20 16 17   Temp: 99 F (37.2 C) 99 F (37.2 C) 98.5 F (36.9 C)   TempSrc: Oral Oral Oral   SpO2: 98% 99% 97%   Weight:      Height:       SpO2: 97 %   Intake/Output Summary (Last 24 hours) at 03/04/2023 0850 Last data filed at 03/04/2023 0408 Gross per 24 hour  Intake --  Output 1450 ml  Net -1450 ml   Filed Weights   02/25/23 1235 02/27/23 1500  Weight: 70.4 kg 70.4 kg    Exam: General exam: In no acute distress. Respiratory system: Good air movement and clear to auscultation. Cardiovascular system: S1 & S2 heard, RRR. No  JVD. Gastrointestinal system: Abdomen is nondistended, soft and nontender.  Extremities: No pedal edema. Skin: No rashes, lesions or ulcers Psychiatry: Judgement and insight appear normal. Mood & affect appropriate. Data Reviewed:    Labs: Basic Metabolic Panel: Recent Labs  Lab 02/25/23 1250 02/27/23 0353 02/28/23 0353 03/01/23 0431 03/03/23 0504  NA 131* 130* 131* 131* 129*  K 4.4 4.2 4.5 4.3 4.1  CL 96* 96* 99 101 91*  CO2 22 23 23 26 26   GLUCOSE 122* 101* 116* 94 101*  BUN 20 22 22 20 16   CREATININE 1.48* 1.25* 1.42* 1.38* 1.39*  CALCIUM 9.9 9.4 9.5 9.0 9.4  MG  --  1.7 1.8 1.8 1.7  PHOS  --  3.6 3.2 3.0 3.1   GFR Estimated Creatinine Clearance: 45.7 mL/min (A) (by C-G formula based on SCr of 1.39 mg/dL (H)). Liver Function Tests: Recent Labs  Lab 02/25/23 1250 02/27/23 0353 03/03/23 0504  AST 21  --  21  ALT 13  --  12  ALKPHOS 66  --  53  BILITOT 0.9  --  0.8  PROT 6.7  --  6.7  ALBUMIN 3.8 3.2* 3.2*   No results for input(s): "LIPASE", "AMYLASE" in the last 168 hours. No results for input(s): "AMMONIA" in the last 168 hours. Coagulation profile Recent Labs  Lab 02/28/23 0353 03/01/23 0431 03/02/23 0725 03/03/23 0503 03/04/23 0508  INR 1.9* 2.3* 2.7* 2.6* 3.5*   COVID-19 Labs  No results for input(s): "DDIMER", "FERRITIN", "LDH", "CRP" in the last 72 hours.  Lab Results  Component Value Date   SARSCOV2NAA NEGATIVE 02/25/2023    CBC: Recent Labs  Lab 02/25/23 1250 02/27/23 0353 02/28/23 0353 03/01/23 0431 03/03/23 0504  WBC 7.7 6.1 5.9 4.8 6.7  NEUTROABS  --   --   --   --  5.1  HGB 12.1* 11.7* 11.5* 10.9* 12.6*  HCT 36.2* 34.9* 34.8* 32.4* 38.3*  MCV 98.4 98.0 98.9 100.0 99.2  PLT 181 158 163 148* 168   Cardiac Enzymes: No results for input(s): "CKTOTAL", "CKMB", "CKMBINDEX", "TROPONINI" in the last 168 hours. BNP (last 3 results) No results for input(s): "PROBNP" in the last 8760 hours. CBG: Recent Labs  Lab 02/25/23 1250   GLUCAP 124*   D-Dimer: No results for input(s): "DDIMER" in the last 72 hours. Hgb A1c: No results for input(s): "HGBA1C" in the last 72 hours. Lipid Profile: No results for input(s): "CHOL", "HDL", "LDLCALC", "TRIG", "CHOLHDL", "LDLDIRECT" in the last 72 hours. Thyroid function studies: No results for input(s): "TSH", "T4TOTAL", "T3FREE", "THYROIDAB" in the last 72 hours.  Invalid input(s): "FREET3" Anemia work up: No results for input(s): "VITAMINB12", "FOLATE", "FERRITIN", "TIBC", "IRON", "RETICCTPCT" in the last 72 hours. Sepsis Labs: Recent Labs  Lab 02/27/23 0353 02/28/23 0353 03/01/23 0431 03/03/23 0504  WBC 6.1 5.9 4.8 6.7   Microbiology Recent Results (from the past 240 hour(s))  Resp panel by RT-PCR (RSV, Flu A&B, Covid) Anterior Nasal Swab     Status: None   Collection Time: 02/25/23  6:39 PM   Specimen: Anterior Nasal Swab  Result Value Ref Range Status   SARS Coronavirus 2 by RT PCR NEGATIVE NEGATIVE Final   Influenza A by PCR NEGATIVE NEGATIVE Final   Influenza B by PCR NEGATIVE NEGATIVE Final    Comment: (NOTE) The Xpert Xpress SARS-CoV-2/FLU/RSV plus assay is intended as an aid in the diagnosis of influenza from Nasopharyngeal swab specimens and should not be used as a sole basis for treatment. Nasal washings and aspirates are unacceptable for Xpert Xpress SARS-CoV-2/FLU/RSV testing.  Fact Sheet for Patients: BloggerCourse.com  Fact Sheet for Healthcare Providers: SeriousBroker.it  This test is not yet approved or cleared by the [place] FDA and has been authorized for detection and/or diagnosis of SARS-CoV-2 by FDA under an Emergency Use Authorization (EUA). This EUA will remain in effect (meaning this test can be used) for the duration of the COVID-19 declaration under Section 564(b)(1) of the Act, 21 U.S.C. section 360bbb-3(b)(1), unless the authorization is terminated or revoked.     Resp  Syncytial Virus by PCR NEGATIVE NEGATIVE Final    Comment: (NOTE) Fact Sheet for Patients: BloggerCourse.com  Fact Sheet for Healthcare Providers: SeriousBroker.it  This test is not yet approved or cleared by the [place] FDA and has been authorized for detection and/or diagnosis of SARS-CoV-2 by FDA under an Emergency Use Authorization (EUA). This EUA will remain in effect (meaning this test can be used) for the duration of the COVID-19 declaration under Section 564(b)(1) of the Act, 21 U.S.C. section 360bbb-3(b)(1), unless the authorization is terminated or revoked.  Performed at [facility], [address], [place], [place] [address]   Culture, blood (Routine X 2) w Reflex to ID Panel     Status: None (Preliminary result)   Collection Time: 03/01/23  5:58 PM   Specimen: BLOOD  Result Value Ref Range Status   Specimen Description BLOOD LEFT ANTECUBITAL  Final   Special Requests   Final    BOTTLES DRAWN AEROBIC AND ANAEROBIC Blood Culture adequate volume   Culture  Setup Time   Final    GRAM POSITIVE COCCI IN PAIRS AND CHAINS IN BOTH AEROBIC AND ANAEROBIC BOTTLES CRITICAL RESULT CALLED TO, READ BACK BY AND VERIFIED WITH: PHARMD J.FRENS AT 1125 ON 03/02/2023 BY T.SAAD.  Culture   Final    GRAM POSITIVE COCCI CULTURE REINCUBATED FOR BETTER GROWTH Performed at [facility], [address], [place], [place] [address]    Report Status PENDING  Incomplete  Blood Culture ID Panel (Reflexed)     Status: Abnormal   Collection Time: 03/01/23  5:58 PM  Result Value Ref Range Status   Enterococcus faecalis DETECTED (A) NOT DETECTED Final    Comment: CRITICAL RESULT CALLED TO, READ BACK BY AND VERIFIED WITH: PHARMD J.FRENS AT 1125 ON 03/02/2023 BY T.SAAD.    Enterococcus Faecium NOT DETECTED NOT DETECTED Final   Listeria monocytogenes NOT DETECTED NOT DETECTED Final   Staphylococcus species DETECTED (A) NOT  DETECTED Final    Comment: CRITICAL RESULT CALLED TO, READ BACK BY AND VERIFIED WITH: PHARMD J.FRENS AT 1125 ON 03/02/2023 BY T.SAAD.    Staphylococcus aureus (BCID) NOT DETECTED NOT DETECTED Final   Staphylococcus epidermidis DETECTED (A) NOT DETECTED Final    Comment: CRITICAL RESULT CALLED TO, READ BACK BY AND VERIFIED WITH: PHARMD J.FRENS AT 1125 ON 03/02/2023 BY T.SAAD.    Staphylococcus lugdunensis NOT DETECTED NOT DETECTED Final   Streptococcus species NOT DETECTED NOT DETECTED Final   Streptococcus agalactiae NOT DETECTED NOT DETECTED Final   Streptococcus pneumoniae NOT DETECTED NOT DETECTED Final   Streptococcus pyogenes NOT DETECTED NOT DETECTED Final   A.calcoaceticus-baumannii NOT DETECTED NOT DETECTED Final   Bacteroides fragilis NOT DETECTED NOT DETECTED Final   Enterobacterales NOT DETECTED NOT DETECTED Final   Enterobacter cloacae complex NOT DETECTED NOT DETECTED Final   Escherichia coli NOT DETECTED NOT DETECTED Final   Klebsiella aerogenes NOT DETECTED NOT DETECTED Final   Klebsiella oxytoca NOT DETECTED NOT DETECTED Final   Klebsiella pneumoniae NOT DETECTED NOT DETECTED Final   Proteus species NOT DETECTED NOT DETECTED Final   Salmonella species NOT DETECTED NOT DETECTED Final   Serratia marcescens NOT DETECTED NOT DETECTED Final   Haemophilus influenzae NOT DETECTED NOT DETECTED Final   Neisseria meningitidis NOT DETECTED NOT DETECTED Final   Pseudomonas aeruginosa NOT DETECTED NOT DETECTED Final   Stenotrophomonas maltophilia NOT DETECTED NOT DETECTED Final   Candida albicans NOT DETECTED NOT DETECTED Final   Candida auris NOT DETECTED NOT DETECTED Final   Candida glabrata NOT DETECTED NOT DETECTED Final   Candida krusei NOT DETECTED NOT DETECTED Final   Candida parapsilosis NOT DETECTED NOT DETECTED Final   Candida tropicalis NOT DETECTED NOT DETECTED Final   Cryptococcus neoformans/gattii NOT DETECTED NOT DETECTED Final   Methicillin resistance mecA/C NOT  DETECTED NOT DETECTED Final   Vancomycin resistance NOT DETECTED NOT DETECTED Final    Comment: Performed at [facility], [address] [place], [place] [address]  Culture, blood (Routine X 2) w Reflex to ID Panel     Status: None (Preliminary result)   Collection Time: 03/01/23  6:07 PM   Specimen: BLOOD LEFT ARM  Result Value Ref Range Status   Specimen Description BLOOD LEFT ARM  Final   Special Requests   Final    BOTTLES DRAWN AEROBIC AND ANAEROBIC Blood Culture adequate volume   Culture  Setup Time   Final    GRAM POSITIVE COCCI IN CHAINS IN BOTH AEROBIC AND ANAEROBIC BOTTLES CRITICAL VALUE NOTED.  VALUE IS CONSISTENT WITH PREVIOUSLY REPORTED AND CALLED VALUE.    Culture   Final    GRAM POSITIVE COCCI IDENTIFICATION AND SUSCEPTIBILITIES TO FOLLOW Performed at [facility], [address], [place], [place] [address]  Report Status PENDING  Incomplete  Urine Culture (for pregnant, neutropenic or urologic patients or patients with an indwelling urinary catheter)     Status: Abnormal   Collection Time: 03/01/23 11:30 PM   Specimen: Urine, Clean Catch  Result Value Ref Range Status   Specimen Description URINE, CLEAN CATCH  Final   Special Requests   Final    NONE Performed at [facility], [address], [place], [place] [address]    Culture (A)  Final    >=100,000 COLONIES/mL ENTEROCOCCUS FAECALIS 40,000 COLONIES/mL KLEBSIELLA OXYTOCA    Report Status 03/04/2023 FINAL  Final   Organism ID, Bacteria ENTEROCOCCUS FAECALIS (A)  Final   Organism ID, Bacteria KLEBSIELLA OXYTOCA (A)  Final      Susceptibility   Enterococcus faecalis - MIC*    AMPICILLIN <=2 SENSITIVE Sensitive     NITROFURANTOIN <=16 SENSITIVE Sensitive     VANCOMYCIN 1 SENSITIVE Sensitive     * >=100,000 COLONIES/mL ENTEROCOCCUS FAECALIS   Klebsiella oxytoca - MIC*    AMPICILLIN >=32 RESISTANT Resistant     CEFEPIME <=0.12 SENSITIVE Sensitive     CEFTRIAXONE <=0.25 SENSITIVE  Sensitive     CIPROFLOXACIN <=0.25 SENSITIVE Sensitive     GENTAMICIN <=1 SENSITIVE Sensitive     IMIPENEM <=0.25 SENSITIVE Sensitive     NITROFURANTOIN 32 SENSITIVE Sensitive     TRIMETH/SULFA <=20 SENSITIVE Sensitive     AMPICILLIN/SULBACTAM 16 INTERMEDIATE Intermediate     PIP/TAZO <=4 SENSITIVE Sensitive ug/mL    * 40,000 COLONIES/mL KLEBSIELLA OXYTOCA  Culture, blood (Routine X 2) w Reflex to ID Panel     Status: None (Preliminary result)   Collection Time: 03/03/23  5:04 AM   Specimen: BLOOD  Result Value Ref Range Status   Specimen Description BLOOD BLOOD RIGHT ARM  Final   Special Requests   Final    BOTTLES DRAWN AEROBIC AND ANAEROBIC Blood Culture adequate volume   Culture   Final    NO GROWTH 1 DAY Performed at [facility], [address], [place], [place] [address]    Report Status PENDING  Incomplete  Culture, blood (Routine X 2) w Reflex to ID Panel     Status: None (Preliminary result)   Collection Time: 03/03/23  5:06 AM   Specimen: BLOOD  Result Value Ref Range Status   Specimen Description BLOOD BLOOD RIGHT ARM  Final   Special Requests   Final    BOTTLES DRAWN AEROBIC AND ANAEROBIC Blood Culture adequate volume   Culture   Final    NO GROWTH 1 DAY Performed at [facility], [address], [place], [place] [address]    Report Status PENDING  Incomplete     Medications:    fluticasone  2 spray Each Nare Daily   lidocaine  1 patch Transdermal Q24H   metoprolol tartrate  25 mg Oral BID   pantoprazole (PROTONIX) IV  40 mg Intravenous Q24H   Warfarin - Pharmacist Dosing Inpatient   Does not apply q1600   Continuous Infusions:  ampicillin (OMNIPEN) IV 2 g (03/04/23 0525)      LOS: 4 days   Marinda Elk  [facility]  03/04/2023, 8:50 AM

## 2023-03-05 ENCOUNTER — Inpatient Hospital Stay (HOSPITAL_COMMUNITY): Payer: Medicare HMO

## 2023-03-05 ENCOUNTER — Inpatient Hospital Stay (HOSPITAL_COMMUNITY): Payer: Medicare HMO | Admitting: Anesthesiology

## 2023-03-05 ENCOUNTER — Encounter (HOSPITAL_COMMUNITY)
Admission: EM | Disposition: A | Discharge status: Skilled Nursing Facility | Payer: Self-pay | Source: Home / Self Care | Attending: Internal Medicine

## 2023-03-05 DIAGNOSIS — R7881 Bacteremia: Secondary | ICD-10-CM

## 2023-03-05 DIAGNOSIS — I340 Nonrheumatic mitral (valve) insufficiency: Secondary | ICD-10-CM | POA: Diagnosis not present

## 2023-03-05 HISTORY — PX: TRANSESOPHAGEAL ECHOCARDIOGRAM (CATH LAB): EP1270

## 2023-03-05 LAB — PROTIME-INR
INR: 4.9 (ref 0.8–1.2)
Prothrombin Time: 46.5 s — ABNORMAL HIGH (ref 11.4–15.2)

## 2023-03-05 LAB — ECHO TEE

## 2023-03-05 SURGERY — TRANSESOPHAGEAL ECHOCARDIOGRAM (TEE) (CATHLAB)
Anesthesia: General

## 2023-03-05 MED ORDER — PHENYLEPHRINE 80 MCG/ML (10ML) SYRINGE FOR IV PUSH (FOR BLOOD PRESSURE SUPPORT)
PREFILLED_SYRINGE | INTRAVENOUS | Status: DC | PRN
  Administered 2023-03-05: 160 ug via INTRAVENOUS
  Administered 2023-03-05: 80 ug via INTRAVENOUS

## 2023-03-05 MED ORDER — ESMOLOL HCL-SODIUM CHLORIDE 2000 MG/100ML IV SOLN
INTRAVENOUS | Status: DC | PRN
  Administered 2023-03-05 (×2): 30 mg via INTRAVENOUS
  Administered 2023-03-05: 10 mg via INTRAVENOUS
  Administered 2023-03-05: 30 mg via INTRAVENOUS

## 2023-03-05 MED ORDER — LIDOCAINE 2% (20 MG/ML) 5 ML SYRINGE
INTRAMUSCULAR | Status: DC | PRN
  Administered 2023-03-05: 60 mg via INTRAVENOUS

## 2023-03-05 MED ORDER — SODIUM CHLORIDE 0.9 % IV SOLN
INTRAVENOUS | Status: DC | PRN
Start: 2023-03-05 — End: 2023-03-05

## 2023-03-05 MED ORDER — PROPOFOL 10 MG/ML IV BOLUS
INTRAVENOUS | Status: DC | PRN
  Administered 2023-03-05: 110 mg via INTRAVENOUS

## 2023-03-05 MED ORDER — SUCCINYLCHOLINE CHLORIDE 200 MG/10ML IV SOSY
PREFILLED_SYRINGE | INTRAVENOUS | Status: DC | PRN
  Administered 2023-03-05: 100 mg via INTRAVENOUS

## 2023-03-05 MED ORDER — ONDANSETRON HCL 4 MG/2ML IJ SOLN
INTRAMUSCULAR | Status: DC | PRN
  Administered 2023-03-05: 4 mg via INTRAVENOUS

## 2023-03-05 NOTE — TOC Progression Note (Signed)
Transition of Care [facility]) - Progression Note    Patient Details  Name: Ralph Sullivan MRN: [id number] Date of Birth: [birth year]  [facility] CM/SW Contact  Delilah Shan, LCSWA Phone Number: 03/05/2023, 10:36 AM  Clinical Narrative:     CSW informed by French Ana with [facility] that insurance authorization has been approved for SNF. CSW informed MD. CSW will continue to follow and assist with patients dc planning needs.  Expected Discharge Plan: Skilled Nursing Facility Barriers to Discharge: Continued Medical Work up  Expected Discharge Plan and Services       Living arrangements for the past 2 months: Single Family Home                                       Social Determinants of Health (SDOH) Interventions SDOH Screenings   Food Insecurity: No Food Insecurity (02/27/2023)  Housing: Low Risk  (02/27/2023)  Transportation Needs: No Transportation Needs (02/27/2023)  Utilities: Not At Risk (02/27/2023)  Tobacco Use: Medium Risk (02/25/2023)    Readmission Risk Interventions     No data to display

## 2023-03-05 NOTE — Progress Notes (Signed)
[facility] PROGRESS NOTE    Progress Note  Ralph Sullivan  ZOX:[id number] DOB: Feb 02, 1946 DOA: 02/25/2023 PCP: Lorenda Ishihara, MD     Brief Narrative:   Ralph Sullivan is an 77 y.o. male past medical history of A-fib on Coumadin chronic kidney disease stage IIIa, CVA comes in with generalized weakness complicated by A-fib with RVR and found to have Enterococcus faecalis bacteremia   Assessment/Plan:   Enterococcus faecalis bacteremia: BC ID positive for Enterococcus, surveillance blood culture on 03/02/2025 negative till date. 2D echo showed an EF of 45%, left ventricular global hypokinesia, no vegetation appreciated. TEE had to be delayed for today.  Urine culture growing 100,000 colonies of Enterococcus faecalis sensitive to ampicillin Continue ampicillin.  Appreciate ID assistance.  Mild left hydroureteronephrosis: Urine cultures showed greater than 100,000 colonies of Enterococcus faecalis. Follow-up with urology as an outpatient.  Small bowel obstruction/small bowel lesion: CT scan of the abdomen pelvis showed partially resolving SBO oral contrast in cecum.  Previously described transition point is no longer appreciated. Surgery on board, have advance diet which she has been tolerated. Resume Coumadin.  A-fib with RVR: Continue metoprolol and Coumadin. Heart rate controlled. Decrease Coumadin INR supratherapeutic.  Sinus bradycardia: Clonidine hold. Heart rate now has been fast and metoprolol has been resumed at low-dose okay to increase.  Generalized weakness and deconditioning: Probably related to cardiac condition and age related. PT OT has been consulted recommended skilled nursing facility.  Essential hypertension/chronic systolic heart failure: Clonidine was held as he was having 3-second pauses. Continue metoprolol.  Chronic kidney disease stage IIIa: With back creatinine at baseline.   DVT prophylaxis: coumadin Family  Communication:none Status is: Inpatient Remains inpatient appropriate because: Enterococcus faecalis bacteremia    Code Status:     Code Status Orders  (From admission, onward)           Start     Ordered   02/26/23 1526  Full code  Continuous       Question:  By:  Answer:  Consent: discussion documented in EHR   02/26/23 1526           Code Status History     Date Active Date Inactive Code Status Order ID Comments User Context   07/23/2022 1721 07/24/2022 1924 Full Code [id number]  Synetta Fail, MD ED   07/16/2016 0756 07/17/2016 1449 Full Code [id number]  Jerre Simon, PA ED   10/14/2015 2325 10/19/2015 1526 Full Code [id number]  Pearson Grippe, MD Inpatient      Advance Directive Documentation    Flowsheet Row Most Recent Value  Type of Advance Directive Healthcare Power of Attorney, Living will  Pre-existing out of facility DNR order (yellow form or pink MOST form) --  "MOST" Form in Place? --         IV Access:   Peripheral IV   Procedures and diagnostic studies:   ECHOCARDIOGRAM COMPLETE  Result Date: 03/03/2023    ECHOCARDIOGRAM REPORT   [name] Date of Exam: 03/03/2023 Medical Rec #:  [id number]     Height:       72.0 in Accession #:    [id number]    Weight:       155.2 lb Date of Birth:  1946/02/05      BSA:          1.913 m Patient Age:    77 years      BP:  131/83 mmHg Patient Gender: M             HR:           93 bpm. Exam Location:  Inpatient Procedure: 2D Echo, Color Doppler, Cardiac Doppler and Intracardiac            Opacification Agent Indications:    Bacteremia  History:        Patient has prior history of Echocardiogram examinations, most                 recent 04/30/2022. Arrythmias:Atrial Fibrillation; Risk                 Factors:Hypertension.  Sonographer:    Irving Burton Senior RDCS Referring Phys: A CALDWELL POWELL JR  Sonographer Comments: Technically difficult due to significant lung interference URI IMPRESSIONS  1.  Left ventricular ejection fraction, by estimation, is 45 to 50%. The left ventricle has mildly decreased function. The left ventricle demonstrates global hypokinesis. Left ventricular diastolic parameters are indeterminate.  2. Right ventricular systolic function is mildly reduced. The right ventricular size is normal. Tricuspid regurgitation signal is inadequate for assessing PA pressure.  3. Left atrial size was severely dilated.  4. Right atrial size was mildly dilated.  5. The mitral valve is normal in structure. Trivial mitral valve regurgitation.  6. The aortic valve was not well visualized. Aortic valve regurgitation is trivial. No aortic stenosis is present. Conclusion(s)/Recommendation(s): No vegetation seen but technically difficult study. Consider a transesophageal echocardiogram to exclude infective endocarditis if clinically indicated. FINDINGS  Left Ventricle: Left ventricular ejection fraction, by estimation, is 45 to 50%. The left ventricle has mildly decreased function. The left ventricle demonstrates global hypokinesis. Definity contrast agent was given IV to delineate the left ventricular  endocardial borders. The left ventricular internal cavity size was normal in size. There is no left ventricular hypertrophy. Left ventricular diastolic parameters are indeterminate. Right Ventricle: The right ventricular size is normal. Right vetricular wall thickness was not well visualized. Right ventricular systolic function is mildly reduced. Tricuspid regurgitation signal is inadequate for assessing PA pressure. Left Atrium: Left atrial size was severely dilated. Right Atrium: Right atrial size was mildly dilated. Pericardium: There is no evidence of pericardial effusion. Mitral Valve: The mitral valve is normal in structure. Trivial mitral valve regurgitation. Tricuspid Valve: The tricuspid valve is normal in structure. Tricuspid valve regurgitation is trivial. Aortic Valve: The aortic valve was not well  visualized. Aortic valve regurgitation is trivial. No aortic stenosis is present. Pulmonic Valve: The pulmonic valve was not well visualized. Pulmonic valve regurgitation is trivial. Aorta: The aortic root is normal in size and structure. IAS/Shunts: The interatrial septum was not well visualized.  LEFT VENTRICLE PLAX 2D LVIDd:         5.40 cm LVIDs:         4.30 cm LV PW:         0.90 cm LV IVS:        0.90 cm LVOT diam:     2.50 cm LV SV:         48 LV SV Index:   25 LVOT Area:     4.91 cm  RIGHT VENTRICLE RV S prime:     10.40 cm/s TAPSE (M-mode): 1.8 cm LEFT ATRIUM              Index         RIGHT ATRIUM           Index LA  diam:        5.50 cm  2.88 cm/m    RA Area:     24.60 cm LA Vol (A2C):   123.0 ml 64.30 ml/m   RA Volume:   73.80 ml  38.58 ml/m LA Vol (A4C):   203.0 ml 106.12 ml/m LA Biplane Vol: 167.0 ml 87.30 ml/m  AORTIC VALVE LVOT Vmax:   56.80 cm/s LVOT Vmean:  44.150 cm/s LVOT VTI:    0.097 m  AORTA Ao Root diam: 3.80 cm  SHUNTS Systemic VTI:  0.10 m Systemic Diam: 2.50 cm Epifanio Lesches MD Electronically signed by Epifanio Lesches MD Signature Date/Time: 03/03/2023/4:31:12 PM    Final      Medical Consultants:   None.   Subjective:    Ralph Sullivan no new complaints.  Objective:    Vitals:   03/04/23 1300 03/04/23 1438 03/04/23 1959 03/05/23 0415  BP: (!) 141/77  121/84 (!) 157/88  Pulse: (!) 111  81 87  Resp:  17 18 18   Temp: (!) 100.4 F (38 C)  99.3 F (37.4 C) 98.1 F (36.7 C)  TempSrc: Oral  Oral Oral  SpO2: 90%  98% 95%  Weight:      Height:       SpO2: 95 %   Intake/Output Summary (Last 24 hours) at 03/05/2023 0715 Last data filed at 03/05/2023 0415 Gross per 24 hour  Intake 507.65 ml  Output 1000 ml  Net -492.35 ml   Filed Weights   02/25/23 1235 02/27/23 1500  Weight: 70.4 kg 70.4 kg    Exam: General exam: In no acute distress. Respiratory system: Good air movement and clear to auscultation. Cardiovascular system: S1 & S2 heard,  RRR. No JVD. Gastrointestinal system: Abdomen is nondistended, soft and nontender.  Extremities: No pedal edema. Skin: No rashes, lesions or ulcers Psychiatry: Judgement and insight appear normal. Mood & affect appropriate. Data Reviewed:    Labs: Basic Metabolic Panel: Recent Labs  Lab 02/27/23 0353 02/28/23 0353 03/01/23 0431 03/03/23 0504  NA 130* 131* 131* 129*  K 4.2 4.5 4.3 4.1  CL 96* 99 101 91*  CO2 23 23 26 26   GLUCOSE 101* 116* 94 101*  BUN 22 22 20 16   CREATININE 1.25* 1.42* 1.38* 1.39*  CALCIUM 9.4 9.5 9.0 9.4  MG 1.7 1.8 1.8 1.7  PHOS 3.6 3.2 3.0 3.1   GFR Estimated Creatinine Clearance: 45.7 mL/min (A) (by C-G formula based on SCr of 1.39 mg/dL (H)). Liver Function Tests: Recent Labs  Lab 02/27/23 0353 03/03/23 0504  AST  --  21  ALT  --  12  ALKPHOS  --  53  BILITOT  --  0.8  PROT  --  6.7  ALBUMIN 3.2* 3.2*   No results for input(s): "LIPASE", "AMYLASE" in the last 168 hours. No results for input(s): "AMMONIA" in the last 168 hours. Coagulation profile Recent Labs  Lab 03/01/23 0431 03/02/23 0725 03/03/23 0503 03/04/23 0508 03/05/23 0350  INR 2.3* 2.7* 2.6* 3.5* 4.9*   COVID-19 Labs  No results for input(s): "DDIMER", "FERRITIN", "LDH", "CRP" in the last 72 hours.  Lab Results  Component Value Date   SARSCOV2NAA NEGATIVE 02/25/2023    CBC: Recent Labs  Lab 02/27/23 0353 02/28/23 0353 03/01/23 0431 03/03/23 0504  WBC 6.1 5.9 4.8 6.7  NEUTROABS  --   --   --  5.1  HGB 11.7* 11.5* 10.9* 12.6*  HCT 34.9* 34.8* 32.4* 38.3*  MCV 98.0 98.9 100.0 99.2  PLT 158 163 148* 168   Cardiac Enzymes: No results for input(s): "CKTOTAL", "CKMB", "CKMBINDEX", "TROPONINI" in the last 168 hours. BNP (last 3 results) No results for input(s): "PROBNP" in the last 8760 hours. CBG: No results for input(s): "GLUCAP" in the last 168 hours.  D-Dimer: No results for input(s): "DDIMER" in the last 72 hours. Hgb A1c: No results for input(s):  "HGBA1C" in the last 72 hours. Lipid Profile: No results for input(s): "CHOL", "HDL", "LDLCALC", "TRIG", "CHOLHDL", "LDLDIRECT" in the last 72 hours. Thyroid function studies: No results for input(s): "TSH", "T4TOTAL", "T3FREE", "THYROIDAB" in the last 72 hours.  Invalid input(s): "FREET3" Anemia work up: No results for input(s): "VITAMINB12", "FOLATE", "FERRITIN", "TIBC", "IRON", "RETICCTPCT" in the last 72 hours. Sepsis Labs: Recent Labs  Lab 02/27/23 0353 02/28/23 0353 03/01/23 0431 03/03/23 0504  WBC 6.1 5.9 4.8 6.7   Microbiology Recent Results (from the past 240 hour(s))  Resp panel by RT-PCR (RSV, Flu A&B, Covid) Anterior Nasal Swab     Status: None   Collection Time: 02/25/23  6:39 PM   Specimen: Anterior Nasal Swab  Result Value Ref Range Status   SARS Coronavirus 2 by RT PCR NEGATIVE NEGATIVE Final   Influenza A by PCR NEGATIVE NEGATIVE Final   Influenza B by PCR NEGATIVE NEGATIVE Final    Comment: (NOTE) The Xpert Xpress SARS-CoV-2/FLU/RSV plus assay is intended as an aid in the diagnosis of influenza from Nasopharyngeal swab specimens and should not be used as a sole basis for treatment. Nasal washings and aspirates are unacceptable for Xpert Xpress SARS-CoV-2/FLU/RSV testing.  Fact Sheet for Patients: BloggerCourse.com  Fact Sheet for Healthcare Providers: SeriousBroker.it  This test is not yet approved or cleared by the [place] FDA and has been authorized for detection and/or diagnosis of SARS-CoV-2 by FDA under an Emergency Use Authorization (EUA). This EUA will remain in effect (meaning this test can be used) for the duration of the COVID-19 declaration under Section 564(b)(1) of the Act, 21 U.S.C. section 360bbb-3(b)(1), unless the authorization is terminated or revoked.     Resp Syncytial Virus by PCR NEGATIVE NEGATIVE Final    Comment: (NOTE) Fact Sheet for  Patients: BloggerCourse.com  Fact Sheet for Healthcare Providers: SeriousBroker.it  This test is not yet approved or cleared by the [place] FDA and has been authorized for detection and/or diagnosis of SARS-CoV-2 by FDA under an Emergency Use Authorization (EUA). This EUA will remain in effect (meaning this test can be used) for the duration of the COVID-19 declaration under Section 564(b)(1) of the Act, 21 U.S.C. section 360bbb-3(b)(1), unless the authorization is terminated or revoked.  Performed at [facility], [address], [place], [place] [address]   Culture, blood (Routine X 2) w Reflex to ID Panel     Status: Abnormal   Collection Time: 03/01/23  5:58 PM   Specimen: BLOOD  Result Value Ref Range Status   Specimen Description BLOOD LEFT ANTECUBITAL  Final   Special Requests   Final    BOTTLES DRAWN AEROBIC AND ANAEROBIC Blood Culture adequate volume   Culture  Setup Time   Final    GRAM POSITIVE COCCI IN PAIRS AND CHAINS IN BOTH AEROBIC AND ANAEROBIC BOTTLES CRITICAL RESULT CALLED TO, READ BACK BY AND VERIFIED WITH: PHARMD J.FRENS AT 1125 ON 03/02/2023 BY T.SAAD.    Culture (A)  Final    ENTEROCOCCUS FAECALIS SUSCEPTIBILITIES PERFORMED ON PREVIOUS CULTURE WITHIN THE LAST 5 DAYS. STAPHYLOCOCCUS EPIDERMIDIS THE SIGNIFICANCE OF ISOLATING THIS  ORGANISM FROM A SINGLE SET OF BLOOD CULTURES WHEN MULTIPLE SETS ARE DRAWN IS UNCERTAIN. PLEASE NOTIFY THE MICROBIOLOGY DEPARTMENT WITHIN ONE WEEK IF SPECIATION AND SENSITIVITIES ARE REQUIRED. Performed at [facility], [address], [place], [place] [address]    Report Status 03/04/2023 FINAL  Final  Blood Culture ID Panel (Reflexed)     Status: Abnormal   Collection Time: 03/01/23  5:58 PM  Result Value Ref Range Status   Enterococcus faecalis DETECTED (A) NOT DETECTED Final    Comment: CRITICAL RESULT CALLED TO, READ BACK BY AND VERIFIED WITH: PHARMD J.FRENS  AT 1125 ON 03/02/2023 BY T.SAAD.    Enterococcus Faecium NOT DETECTED NOT DETECTED Final   Listeria monocytogenes NOT DETECTED NOT DETECTED Final   Staphylococcus species DETECTED (A) NOT DETECTED Final    Comment: CRITICAL RESULT CALLED TO, READ BACK BY AND VERIFIED WITH: PHARMD J.FRENS AT 1125 ON 03/02/2023 BY T.SAAD.    Staphylococcus aureus (BCID) NOT DETECTED NOT DETECTED Final   Staphylococcus epidermidis DETECTED (A) NOT DETECTED Final    Comment: CRITICAL RESULT CALLED TO, READ BACK BY AND VERIFIED WITH: PHARMD J.FRENS AT 1125 ON 03/02/2023 BY T.SAAD.    Staphylococcus lugdunensis NOT DETECTED NOT DETECTED Final   Streptococcus species NOT DETECTED NOT DETECTED Final   Streptococcus agalactiae NOT DETECTED NOT DETECTED Final   Streptococcus pneumoniae NOT DETECTED NOT DETECTED Final   Streptococcus pyogenes NOT DETECTED NOT DETECTED Final   A.calcoaceticus-baumannii NOT DETECTED NOT DETECTED Final   Bacteroides fragilis NOT DETECTED NOT DETECTED Final   Enterobacterales NOT DETECTED NOT DETECTED Final   Enterobacter cloacae complex NOT DETECTED NOT DETECTED Final   Escherichia coli NOT DETECTED NOT DETECTED Final   Klebsiella aerogenes NOT DETECTED NOT DETECTED Final   Klebsiella oxytoca NOT DETECTED NOT DETECTED Final   Klebsiella pneumoniae NOT DETECTED NOT DETECTED Final   Proteus species NOT DETECTED NOT DETECTED Final   Salmonella species NOT DETECTED NOT DETECTED Final   Serratia marcescens NOT DETECTED NOT DETECTED Final   Haemophilus influenzae NOT DETECTED NOT DETECTED Final   Neisseria meningitidis NOT DETECTED NOT DETECTED Final   Pseudomonas aeruginosa NOT DETECTED NOT DETECTED Final   Stenotrophomonas maltophilia NOT DETECTED NOT DETECTED Final   Candida albicans NOT DETECTED NOT DETECTED Final   Candida auris NOT DETECTED NOT DETECTED Final   Candida glabrata NOT DETECTED NOT DETECTED Final   Candida krusei NOT DETECTED NOT DETECTED Final   Candida  parapsilosis NOT DETECTED NOT DETECTED Final   Candida tropicalis NOT DETECTED NOT DETECTED Final   Cryptococcus neoformans/gattii NOT DETECTED NOT DETECTED Final   Methicillin resistance mecA/C NOT DETECTED NOT DETECTED Final   Vancomycin resistance NOT DETECTED NOT DETECTED Final    Comment: Performed at [facility], [address] [place], [place] [address]  Culture, blood (Routine X 2) w Reflex to ID Panel     Status: Abnormal   Collection Time: 03/01/23  6:07 PM   Specimen: BLOOD LEFT ARM  Result Value Ref Range Status   Specimen Description BLOOD LEFT ARM  Final   Special Requests   Final    BOTTLES DRAWN AEROBIC AND ANAEROBIC Blood Culture adequate volume   Culture  Setup Time   Final    GRAM POSITIVE COCCI IN CHAINS IN BOTH AEROBIC AND ANAEROBIC BOTTLES CRITICAL VALUE NOTED.  VALUE IS CONSISTENT WITH PREVIOUSLY REPORTED AND CALLED VALUE. Performed at [facility], [address], [place], [place] [address]    Culture ENTEROCOCCUS  FAECALIS (A)  Final   Report Status 03/04/2023 FINAL  Final   Organism ID, Bacteria ENTEROCOCCUS FAECALIS  Final      Susceptibility   Enterococcus faecalis - MIC*    AMPICILLIN <=2 SENSITIVE Sensitive     VANCOMYCIN 1 SENSITIVE Sensitive     GENTAMICIN SYNERGY SENSITIVE Sensitive     * ENTEROCOCCUS FAECALIS  Urine Culture (for pregnant, neutropenic or urologic patients or patients with an indwelling urinary catheter)     Status: Abnormal   Collection Time: 03/01/23 11:30 PM   Specimen: Urine, Clean Catch  Result Value Ref Range Status   Specimen Description URINE, CLEAN CATCH  Final   Special Requests   Final    NONE Performed at [facility], [address] [place], [place] [address]    Culture (A)  Final    >=100,000 COLONIES/mL ENTEROCOCCUS FAECALIS 40,000 COLONIES/mL KLEBSIELLA OXYTOCA    Report Status 03/04/2023 FINAL  Final   Organism ID, Bacteria ENTEROCOCCUS FAECALIS (A)  Final   Organism ID, Bacteria  KLEBSIELLA OXYTOCA (A)  Final      Susceptibility   Enterococcus faecalis - MIC*    AMPICILLIN <=2 SENSITIVE Sensitive     NITROFURANTOIN <=16 SENSITIVE Sensitive     VANCOMYCIN 1 SENSITIVE Sensitive     * >=100,000 COLONIES/mL ENTEROCOCCUS FAECALIS   Klebsiella oxytoca - MIC*    AMPICILLIN >=32 RESISTANT Resistant     CEFEPIME <=0.12 SENSITIVE Sensitive     CEFTRIAXONE <=0.25 SENSITIVE Sensitive     CIPROFLOXACIN <=0.25 SENSITIVE Sensitive     GENTAMICIN <=1 SENSITIVE Sensitive     IMIPENEM <=0.25 SENSITIVE Sensitive     NITROFURANTOIN 32 SENSITIVE Sensitive     TRIMETH/SULFA <=20 SENSITIVE Sensitive     AMPICILLIN/SULBACTAM 16 INTERMEDIATE Intermediate     PIP/TAZO <=4 SENSITIVE Sensitive ug/mL    * 40,000 COLONIES/mL KLEBSIELLA OXYTOCA  Culture, blood (Routine X 2) w Reflex to ID Panel     Status: None (Preliminary result)   Collection Time: 03/03/23  5:04 AM   Specimen: BLOOD  Result Value Ref Range Status   Specimen Description BLOOD BLOOD RIGHT ARM  Final   Special Requests   Final    BOTTLES DRAWN AEROBIC AND ANAEROBIC Blood Culture adequate volume   Culture   Final    NO GROWTH 1 DAY Performed at [facility], [address] [place], [place] [address]    Report Status PENDING  Incomplete  Culture, blood (Routine X 2) w Reflex to ID Panel     Status: None (Preliminary result)   Collection Time: 03/03/23  5:06 AM   Specimen: BLOOD  Result Value Ref Range Status   Specimen Description BLOOD BLOOD RIGHT ARM  Final   Special Requests   Final    BOTTLES DRAWN AEROBIC AND ANAEROBIC Blood Culture adequate volume   Culture   Final    NO GROWTH 1 DAY Performed at [facility], [address] [place], [place] [address]    Report Status PENDING  Incomplete     Medications:    fluticasone  2 spray Each Nare Daily   lidocaine  1 patch Transdermal Q24H   metoprolol tartrate  25 mg Oral BID   pantoprazole (PROTONIX) IV  40 mg Intravenous Q24H   Warfarin -  Pharmacist Dosing Inpatient   Does not apply q1600   Continuous Infusions:  ampicillin (OMNIPEN) IV 2 g (03/05/23 0555)      LOS: 5 days  Marinda Elk  [facility]  03/05/2023, 7:15 AM

## 2023-03-05 NOTE — Progress Notes (Signed)
Occupational Therapy Treatment Patient Details Name: Ralph Sullivan MRN: [id number] DOB: 1945/06/26 Today's Date: 03/05/2023   History of present illness The pt is a 77 yo male presenting 10/31 with generalized weakness in BLE. PMH includes: atrial fibrillation on metoprolol and warfarin, hyperhidrosis of the hands, and CKD.   OT comments  Patient just received pain meds, and willing to participate with seated grooming this date.  HR and BP continue to be elevated, but HR only to 112 during session.  Patient unable to really tolerate sitting EOB for long, assisted with repositioning and warm blanket provided.  OT will continue efforts in the acute setting and Patient will benefit from continued inpatient follow up therapy, <3 hours/day       If plan is discharge home, recommend the following:  Assist for transportation;Assistance with cooking/housework;A lot of help with bathing/dressing/bathroom;A lot of help with walking and/or transfers   Equipment Recommendations  Other (comment)    Recommendations for Other Services      Precautions / Restrictions Precautions Precautions: Fall Precaution Comments: watch HR and BP Restrictions Weight Bearing Restrictions: No       Mobility Bed Mobility Overal bed mobility: Needs Assistance Bed Mobility: Supine to Sit, Sit to Supine     Supine to sit: Min assist, HOB elevated Sit to supine: Min assist        Transfers                         Balance Overall balance assessment: Needs assistance Sitting-balance support: No upper extremity supported, Feet supported Sitting balance-Leahy Scale: Fair                                     ADL either performed or assessed with clinical judgement   ADL Overall ADL's : Needs assistance/impaired Eating/Feeding: Independent;Sitting   Grooming: Wash/dry hands;Wash/dry face;Supervision/safety;Sitting           Upper Body Dressing : Minimal assistance;Moderate  assistance;Sitting   Lower Body Dressing: Moderate assistance;Bed level                      Extremity/Trunk Assessment Upper Extremity Assessment RUE Deficits / Details: 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   Lower Extremity Assessment Lower Extremity Assessment: Defer to PT evaluation   Cervical / Trunk Assessment Cervical / Trunk Assessment: Kyphotic    Vision Baseline Vision/History: 1 Wears glasses Patient Visual Report: No change from baseline     Perception Perception Perception: Not tested   Praxis Praxis Praxis: Not tested    Cognition Arousal: Alert Behavior During Therapy: WFL for tasks assessed/performed Overall Cognitive Status: Within Functional Limits for tasks assessed                                          Exercises      Shoulder Instructions       General Comments      Pertinent Vitals/ Pain       Pain Assessment Pain Assessment: Faces Faces Pain Scale: Hurts little more Pain Location: low back Pain Descriptors / Indicators: Grimacing, Guarding Pain Intervention(s): Monitored during session  Frequency  Min 1X/week        Progress Toward Goals  OT Goals(current goals can now be found in the care plan section)  Progress towards OT goals: Progressing toward goals  Acute Rehab OT Goals OT Goal Formulation: With patient Time For Goal Achievement: 03/12/23 Potential to Achieve Goals: Good  Plan      Co-evaluation                 AM-PAC OT "6 Clicks" Daily Activity     Outcome Measure   Help from another person eating meals?: None Help from another person taking care of personal grooming?: A Little Help from another person toileting, which includes using toliet, bedpan, or urinal?: A Lot Help from another person bathing (including washing, rinsing, drying)?: A Lot Help from another person to put on and  taking off regular upper body clothing?: A Little Help from another person to put on and taking off regular lower body clothing?: A Lot 6 Click Score: 16    End of Session Equipment Utilized During Treatment: Gait belt;Rolling walker (2 wheels)  OT Visit Diagnosis: Unsteadiness on feet (R26.81);Muscle weakness (generalized) (M62.81);History of falling (Z91.81)   Activity Tolerance Patient tolerated treatment well   Patient Left in bed;with call bell/phone within reach;with nursing/sitter in room   Nurse Communication Mobility status        Time: 1610-9604 OT Time Calculation (min): 19 min  Charges: OT General Charges $OT Visit: 1 Visit OT Treatments $Self Care/Home Management : 8-22 mins  03/05/2023  RP, OTR/L  Acute Rehabilitation Services  Office:  [phone]   Suzanna Obey 03/05/2023, 8:54 AM

## 2023-03-05 NOTE — Anesthesia Preprocedure Evaluation (Signed)
Anesthesia Evaluation  Patient identified by MRN, date of birth, ID band Patient awake    Reviewed: Allergy & Precautions, NPO status , Patient's Chart, lab work & pertinent test results, reviewed documented beta blocker date and time   History of Anesthesia Complications Negative for: history of anesthetic complications  Airway Mallampati: II  TM Distance: >3 FB     Dental no notable dental hx. (+) Partial Upper   Pulmonary neg COPD, former smoker, neg PE   breath sounds clear to auscultation       Cardiovascular hypertension, (-) angina (-) CAD, (-) Past MI and (-) Cardiac Stents + dysrhythmias  Rhythm:Irregular Rate:Normal     Neuro/Psych  Neuromuscular disease CVA    GI/Hepatic ,,,(+) neg Cirrhosis        Endo/Other    Renal/GU CRFRenal disease     Musculoskeletal   Abdominal   Peds  Hematology  (+) Blood dyscrasia, anemia   Anesthesia Other Findings   Reproductive/Obstetrics                             Anesthesia Physical Anesthesia Plan  ASA: 3  Anesthesia Plan: General   Post-op Pain Management:    Induction: Intravenous and Rapid sequence  PONV Risk Score and Plan:   Airway Management Planned: Oral ETT  Additional Equipment:   Intra-op Plan:   Post-operative Plan: Extubation in OR  Informed Consent: I have reviewed the patients History and Physical, chart, labs and discussed the procedure including the risks, benefits and alternatives for the proposed anesthesia with the patient or authorized representative who has indicated his/her understanding and acceptance.     Dental advisory given  Plan Discussed with: CRNA  Anesthesia Plan Comments: (Concern for SBO. Reports he has not had BM in 8+ days. No vomiting this morning but reports that he feels very bloated. Will intubate out of abundance of caution. )       Anesthesia Quick Evaluation

## 2023-03-05 NOTE — Anesthesia Procedure Notes (Signed)
Procedure Name: Intubation Date/Time: 03/05/2023 11:01 AM  Performed by: Mariann Barter, MDPre-anesthesia Checklist: Patient identified, Emergency Drugs available, Suction available and Patient being monitored Patient Re-evaluated:Patient Re-evaluated prior to induction Oxygen Delivery Method: Circle System Utilized Preoxygenation: Pre-oxygenation with 100% oxygen Induction Type: IV induction, Rapid sequence and Cricoid Pressure applied Laryngoscope Size: Miller and 3 Grade View: Grade I Tube type: Oral Tube size: 7.5 mm Number of attempts: 1 Airway Equipment and Method: Stylet and Oral airway Placement Confirmation: ETT inserted through vocal cords under direct vision, positive ETCO2 and breath sounds checked- equal and bilateral Secured at: 23 cm Tube secured with: Tape Dental Injury: Teeth and Oropharynx as per pre-operative assessment

## 2023-03-05 NOTE — CV Procedure (Signed)
   Transesophageal Echocardiogram  Indications:bactetemia  Time out performed  During this procedure the patient was administered propofol under anesthesiology supervision to achieve and maintain moderate sedation.  The patient's heart rate, blood pressure, and oxygen saturation are monitored continuously during the procedure.   Findings:  Left Ventricle: EF 30-35% AFIB RVR  Mitral Valve: Trivial mitral regurgitation  Aortic Valve: Mild aortic regurgitation, trileaflet  Tricuspid Valve: Trivial tricuspid regurgitation  Left Atrium: Moderately dilated, spontaneous contrast noted, no left atrial appendage thrombus  Right Atrium: Moderately dilated  Intraatrial septum: No shunt  Bubble Contrast Study: Not performed  Impression: No evidence of vegetation  Donato Schultz, MD

## 2023-03-05 NOTE — Transfer of Care (Signed)
Immediate Anesthesia Transfer of Care Note  Patient: Ezana K Papania  Procedure(s) Performed: TRANSESOPHAGEAL ECHOCARDIOGRAM  Patient Location: PACU  Anesthesia Type:General  Level of Consciousness: awake, alert , and oriented  Airway & Oxygen Therapy: Patient Spontanous Breathing and Patient connected to face mask oxygen  Post-op Assessment: Report given to RN and Post -op Vital signs reviewed and stable  Post vital signs: Reviewed and stable  Last Vitals:  Vitals Value Taken Time  BP    Temp 36.7 C 03/05/23 1135  Pulse 99 03/05/23 1141  Resp 22 03/05/23 1141  SpO2 99 % 03/05/23 1141  Vitals shown include unfiled device data.  Last Pain:  Vitals:   03/05/23 1135  TempSrc: Temporal  PainSc: 0-No pain      Patients Stated Pain Goal: 0 (03/05/23 0800)  Complications: No notable events documented.

## 2023-03-05 NOTE — Progress Notes (Signed)
PHARMACY - ANTICOAGULATION CONSULT NOTE  Pharmacy Consult for Warfarin Indication: atrial fibrillation  Allergies  Allergen Reactions   Allopurinol Other (See Comments)   Colchicine Other (See Comments)   Duloxetine Other (See Comments)    More nervous   Elemental Sulfur Nausea And Vomiting   Oxycontin [Oxycodone Hcl] Nausea And Vomiting    Patient Measurements: Height: 6' (182.9 cm) Weight: 70.4 kg (155 lb 3.3 oz) IBW/kg (Calculated) : 77.6 Heparin dosing weight: 70.4 kg  Vital Signs: Temp: 98 F (36.7 C) (11/08 1135) Temp Source: Temporal (11/08 1135) BP: 153/94 (11/08 1240) Pulse Rate: 85 (11/08 1240)  Labs: Recent Labs    03/03/23 0503 03/03/23 0504 03/04/23 0508 03/05/23 0350  HGB  --  12.6*  --   --   HCT  --  38.3*  --   --   PLT  --  168  --   --   LABPROT 27.9*  --  35.7* 46.5*  INR 2.6*  --  3.5* 4.9*  CREATININE  --  1.39*  --   --     Estimated Creatinine Clearance: 45.7 mL/min (A) (by C-G formula based on SCr of 1.39 mg/dL (H)).   Medical History: Past Medical History:  Diagnosis Date   Atrial fibrillation (HCC) 02/08/2013   CHADS-VAS - 2 (AGE, HTN). On anticoagulation. Holter 6/14 - 2.3 sec pause. Avg HR 87. 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 Pt has hx AF and CVA and is on warfarin PTA. Warfarin last dose given 11/2, then held with possible SBO. Warfarin resumed 11/5 as there are no plans for procedures  -INR= 2.6> 3.5> 4.9 -increased warfarin sensitivity possibly due to recent SBO (low po intake)  and antibiotics  Home warfarin dose: 5mg  daily (per patient)  Goal of Therapy:  Heparin level 0.3-0.7 units/mL INR 2-3 Monitor platelets by anticoagulation protocol: Yes   Plan:  -hold warfarin today -Daily PT/INR -CBC in am  Harland German, PharmD Clinical Pharmacist **Pharmacist phone directory can now be found on [email or web address] ([email or web address]).  Listed under [facility].

## 2023-03-05 NOTE — Plan of Care (Signed)
  Problem: Education: Goal: Knowledge of General Education information will improve Description: Including pain rating scale, medication(s)/side effects and non-pharmacologic comfort measures Outcome: Progressing   Problem: Clinical Measurements: Goal: Ability to maintain clinical measurements within normal limits will improve Outcome: Progressing   Problem: Clinical Measurements: Goal: Diagnostic test results will improve Outcome: Progressing   Problem: Activity: Goal: Risk for activity intolerance will decrease Outcome: Progressing   Problem: Nutrition: Goal: Adequate nutrition will be maintained Outcome: Progressing   

## 2023-03-05 NOTE — Plan of Care (Signed)

## 2023-03-05 NOTE — Progress Notes (Signed)
   [facility] has been requested to perform a transesophageal echocardiogram on Ralph Sullivan for bacteremia.  After careful review of history and examination, the risks and benefits of transesophageal echocardiogram have been explained including risks of esophageal damage, perforation (1:10,000 risk), bleeding, pharyngeal hematoma as well as other potential complications associated with conscious sedation including aspiration, arrhythmia, respiratory failure and death. Alternatives to treatment were discussed, questions were answered. Patient is willing to proceed.   Procedure is scheduled for today at 1:15pm with Dr. Anne Fu. Will place pre-procedural orders.  Corrin Parker, PA-C  03/05/2023 7:57 AM

## 2023-03-05 NOTE — Progress Notes (Signed)
Physical Therapy Treatment Patient Details Name: Ralph Sullivan MRN: [id number] DOB: January 11, 1946 Today's Date: 03/05/2023   History of Present Illness The pt is a 77 yo male presenting 10/31 with generalized weakness in BLE. Enterococcus faecalis bacteremia getting treated with antibiotics. Pt also with SBO resolving. PMH includes: atrial fibrillation on metoprolol and warfarin, hyperhidrosis of the hands, and CKD.    PT Comments  Pt admitted with above diagnosis. Treatment limited as tech came to get pt for TEE after only exercises had been completed by pt.  Will return at later date.  Slow progress toward goals.  Pt currently with functional limitations due to the deficits listed below (see PT Problem List). Pt will benefit from acute skilled PT to increase their independence and safety with mobility to allow discharge.       If plan is discharge home, recommend the following: A lot of help with walking and/or transfers;A lot of help with bathing/dressing/bathroom;Assistance with cooking/housework;Direct supervision/assist for medications management;Direct supervision/assist for financial management;Assist for transportation;Help with stairs or ramp for entrance   Can travel by private vehicle     No  Equipment Recommendations  None recommended by PT    Recommendations for Other Services OT consult     Precautions / Restrictions Precautions Precautions: Fall Precaution Comments: watch HR and BP Restrictions Weight Bearing Restrictions: No     Mobility  Bed Mobility               General bed mobility comments: Pt did exercises to loosen up with therapists cues and assist.  Just prior to having pt get up, nurse came in and stated that TEE was moved up and pt will be taken for TEE in a few min.  Assisted pt with scooting up in bed, pt to long sitting to remove his tshirt as the tech said he had to go in gown.    Transfers                        Ambulation/Gait                    Stairs             Wheelchair Mobility     Tilt Bed    Modified Rankin (Stroke Patients Only)       Balance                                            Cognition Arousal: Alert Behavior During Therapy: WFL for tasks assessed/performed Overall Cognitive Status: Within Functional Limits for tasks assessed                                          Exercises General Exercises - Lower Extremity Ankle Circles/Pumps: AROM, Both, 10 reps Quad Sets: AROM, Both, 10 reps, Supine Gluteal Sets: AROM, Both, 10 reps, Supine Long Arc Quad: AROM, Both, 10 reps, Seated Heel Slides: AAROM, Both, 10 reps, Supine    General Comments General comments (skin integrity, edema, etc.): VSS with exercises only      Pertinent Vitals/Pain Pain Assessment Pain Assessment: Faces Faces Pain Scale: Hurts little more Pain Location: low back Pain Descriptors / Indicators: Grimacing, Guarding Pain Intervention(s): Limited activity within patient's tolerance,  Monitored during session, Repositioned    Home Living                          Prior Function            PT Goals (current goals can now be found in the care plan section) Acute Rehab PT Goals Patient Stated Goal: improve endurance and return home Progress towards PT goals: Not progressing toward goals - comment (slow progress due to medical issues)    Frequency    Min 1X/week      PT Plan      Co-evaluation              AM-PAC PT "6 Clicks" Mobility   Outcome Measure  Help needed turning from your back to your side while in a flat bed without using bedrails?: Total Help needed moving from lying on your back to sitting on the side of a flat bed without using bedrails?: Total Help needed moving to and from a bed to a chair (including a wheelchair)?: Total Help needed standing up from a chair using your arms (e.g., wheelchair or bedside chair)?:  Total Help needed to walk in hospital room?: Total Help needed climbing 3-5 steps with a railing? : Total 6 Click Score: 6    End of Session Equipment Utilized During Treatment: Gait belt Activity Tolerance: Patient limited by fatigue Patient left: with call bell/phone within reach;in bed;with bed alarm set Nurse Communication: Mobility status;Need for lift equipment PT Visit Diagnosis: Unsteadiness on feet (R26.81);Other abnormalities of gait and mobility (R26.89);Muscle weakness (generalized) (M62.81)     Time: 2725-3664 PT Time Calculation (min) (ACUTE ONLY): 14 min  Charges:    $Therapeutic Exercise: 8-22 mins PT General Charges $$ ACUTE PT VISIT: 1 Visit                     Phinley Schall M,PT Acute Rehab Services [phone]    [name] 03/05/2023, 1:12 PM

## 2023-03-05 NOTE — Progress Notes (Signed)
ID brief note   T max 100.4 at 11/7 1 pm  Repeat blood cx 11/6 NG in 2 days  Clinically improving otherwise  TEE 11/8 no vegetations or endocarditis   Likely source of bacteremia is GU   Plan  - Would monitor fevers resolve, at least afebrile for 24 hrs if planned for DC  - can continue IV ampicillin in house and switch to PO amoxicillin 1g po tid for discharge. EOT 11/19 - Fu with myself at [facility] on 11/21 at 3: 30 pm  - ID will so, please recall if any questions or concerns   Odette Fraction, MD Infectious Disease Physician [facility] [address] [place], [place] [address] Phone: [phone]  Fax: [phone]

## 2023-03-05 NOTE — Anesthesia Postprocedure Evaluation (Signed)
Anesthesia Post Note  Patient: Ralph Sullivan  Procedure(s) Performed: TRANSESOPHAGEAL ECHOCARDIOGRAM     Patient location during evaluation: PACU Anesthesia Type: General Level of consciousness: awake and alert Pain management: pain level controlled Vital Signs Assessment: post-procedure vital signs reviewed and stable Respiratory status: spontaneous breathing, nonlabored ventilation, respiratory function stable and patient connected to nasal cannula oxygen Cardiovascular status: blood pressure returned to baseline and stable Postop Assessment: no apparent nausea or vomiting Anesthetic complications: no   No notable events documented.  Last Vitals:  Vitals:   03/05/23 1230 03/05/23 1240  BP: (!) 149/107 (!) 153/94  Pulse: (!) 107 85  Resp: 19 (!) 24  Temp:    SpO2: (!) 87% 93%    Last Pain:  Vitals:   03/05/23 1220  TempSrc:   PainSc: 0-No pain                 Mariann Barter

## 2023-03-06 DIAGNOSIS — R7881 Bacteremia: Secondary | ICD-10-CM | POA: Diagnosis not present

## 2023-03-06 DIAGNOSIS — B952 Enterococcus as the cause of diseases classified elsewhere: Secondary | ICD-10-CM | POA: Diagnosis not present

## 2023-03-06 DIAGNOSIS — I4811 Longstanding persistent atrial fibrillation: Secondary | ICD-10-CM | POA: Diagnosis not present

## 2023-03-06 LAB — CBC
HCT: 36 % — ABNORMAL LOW (ref 39.0–52.0)
Hemoglobin: 12.1 g/dL — ABNORMAL LOW (ref 13.0–17.0)
MCH: 32.4 pg (ref 26.0–34.0)
MCHC: 33.6 g/dL (ref 30.0–36.0)
MCV: 96.5 fL (ref 80.0–100.0)
Platelets: 199 10*3/uL (ref 150–400)
RBC: 3.73 MIL/uL — ABNORMAL LOW (ref 4.22–5.81)
RDW: 12.9 % (ref 11.5–15.5)
WBC: 6.7 10*3/uL (ref 4.0–10.5)
nRBC: 0 % (ref 0.0–0.2)

## 2023-03-06 LAB — PROTIME-INR
INR: 4.3 (ref 0.8–1.2)
Prothrombin Time: 41.6 s — ABNORMAL HIGH (ref 11.4–15.2)

## 2023-03-06 MED ORDER — ACETAMINOPHEN 325 MG PO TABS
650.0000 mg | ORAL_TABLET | Freq: Four times a day (QID) | ORAL | Status: DC | PRN
  Administered 2023-03-06: 650 mg via ORAL
  Filled 2023-03-06: qty 2

## 2023-03-06 MED ORDER — METOPROLOL TARTRATE 12.5 MG HALF TABLET: 12.5000 mg | ORAL_TABLET | Freq: Two times a day (BID) | ORAL | Status: DC

## 2023-03-06 MED ORDER — CLONIDINE HCL 0.1 MG PO TABS
0.1000 mg | ORAL_TABLET | Freq: Three times a day (TID) | ORAL | Status: DC
  Administered 2023-03-06 – 2023-03-08 (×7): 0.1 mg via ORAL
  Filled 2023-03-06 (×7): qty 1

## 2023-03-06 MED ORDER — BUTALBITAL-APAP-CAFFEINE 50-325-40 MG PO TABS
1.0000 | ORAL_TABLET | Freq: Four times a day (QID) | ORAL | Status: DC | PRN
  Administered 2023-03-06 – 2023-03-07 (×3): 1 via ORAL
  Filled 2023-03-06 (×3): qty 1

## 2023-03-06 NOTE — Plan of Care (Signed)

## 2023-03-06 NOTE — Plan of Care (Signed)
  Problem: Clinical Measurements: Goal: Ability to maintain clinical measurements within normal limits will improve Outcome: Progressing Goal: Will remain free from infection Outcome: Progressing Goal: Respiratory complications will improve Outcome: Progressing   Problem: Elimination: Goal: Will not experience complications related to urinary retention Outcome: Progressing   Problem: Pain Management: Goal: General experience of comfort will improve Outcome: Progressing   Problem: Safety: Goal: Ability to remain free from injury will improve Outcome: Progressing   Problem: Skin Integrity: Goal: Risk for impaired skin integrity will decrease Outcome: Progressing

## 2023-03-06 NOTE — Progress Notes (Addendum)
PHARMACY - ANTICOAGULATION CONSULT NOTE  Pharmacy Consult for Warfarin Indication: atrial fibrillation  Allergies  Allergen Reactions   Allopurinol Other (See Comments)   Colchicine Other (See Comments)   Duloxetine Other (See Comments)    More nervous   Elemental Sulfur Nausea And Vomiting   Oxycontin [Oxycodone Hcl] Nausea And Vomiting    Patient Measurements: Height: 6' (182.9 cm) Weight: 70.4 kg (155 lb 3.3 oz) IBW/kg (Calculated) : 77.6 Heparin dosing weight: 70.4 kg  Vital Signs: Temp: 97.9 F (36.6 C) (11/09 0258) Temp Source: Oral (11/09 0258) BP: 124/93 (11/09 0359) Pulse Rate: 60 (11/09 0359)  Labs: Recent Labs    03/04/23 0508 03/05/23 0350 03/06/23 0330  HGB  --   --  12.1*  HCT  --   --  36.0*  PLT  --   --  199  LABPROT 35.7* 46.5* 41.6*  INR 3.5* 4.9* 4.3*    Estimated Creatinine Clearance: 45.7 mL/min (A) (by C-G formula based on SCr of 1.39 mg/dL (H)).   Medical History: Past Medical History:  Diagnosis Date   Atrial fibrillation (HCC) 02/08/2013   CHADS-VAS - 2 (AGE, HTN). On anticoagulation. Holter 6/14 - 2.3 sec pause. Avg HR 87. 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 Pt has hx AF and CVA and is on warfarin PTA. Warfarin last dose given 11/2, then held with possible SBO. Warfarin resumed 11/5 as there are no plans for procedures  -INR= 2.6> 3.5> 4.9 > 4.3 -increased warfarin sensitivity possibly due to recent SBO (low po intake) and antibiotics  11/9: INR now down trending to 4.3, CBC stable  (Hgb 12.1, Plt 199).  Home warfarin dose: 5mg  daily (per patient)  Goal of Therapy:  INR 2-3 Monitor platelets by anticoagulation protocol: Yes   Plan:  -hold warfarin today -Daily PT/INR  Enos Fling, PharmD PGY-1 Acute Care Pharmacy Resident 03/06/2023 8:08 AM

## 2023-03-06 NOTE — Progress Notes (Signed)
[facility] PROGRESS NOTE    Progress Note  Ralph Sullivan  NWG:[id number] DOB: 1945/08/15 DOA: 02/25/2023 PCP: Lorenda Ishihara, MD     Brief Narrative:   Ralph Sullivan is an 77 y.o. male past medical history of A-fib on Coumadin chronic kidney disease stage IIIa, CVA comes in with generalized weakness complicated by A-fib with RVR and found to have Enterococcus faecalis bacteremia.  Awaiting skilled nursing facility placement.   Assessment/Plan:   Enterococcus faecalis bacteremia: Likely source genitourinary BC ID positive for Enterococcus, surveillance blood culture on 03/02/2025 negative till date. 2D echo showed an EF of 45%, left ventricular global hypokinesia, no vegetation appreciated. TEE showed an EF of 40% there is no evidence of vegetation. ID recommended to transition to oral amoxicillin which she will continue as an outpatient for sensitive to ampicillin He will continue ampicillin orally as an outpatient end date is 03/16/2023 follow-up with infectious disease on 03/18/2023  Mild left hydroureteronephrosis: Urine cultures showed greater than 100,000 colonies of Enterococcus faecalis. Follow-up with urology as an outpatient.  Small bowel obstruction/small bowel lesion: CT scan of the abdomen pelvis showed partially resolving SBO oral contrast in cecum.  Previously described transition point is no longer appreciated. Surgery on board, have advance diet which she has been tolerated. Resume Coumadin.  A-fib with RVR: Continue metoprolol and Coumadin. Heart rate controlled. Hold Coumadin INR supratherapeutic.  Sinus bradycardia: Clonidine hold. Heart rate now has been fast and metoprolol has been resumed at low-dose okay to increase.  Generalized weakness and deconditioning: Probably related to cardiac condition and age related. PT OT has been consulted recommended skilled nursing facility.  Essential hypertension/chronic systolic heart  failure: Clonidine was held as he was having 3-second pauses. Continue metoprolol.  Chronic kidney disease stage IIIa: With back creatinine at baseline.   DVT prophylaxis: coumadin Family Communication:none Status is: Inpatient Remains inpatient appropriate because: Enterococcus faecalis bacteremia    Code Status:     Code Status Orders  (From admission, onward)           Start     Ordered   02/26/23 1526  Full code  Continuous       Question:  By:  Answer:  Consent: discussion documented in EHR   02/26/23 1526           Code Status History     Date Active Date Inactive Code Status Order ID Comments User Context   07/23/2022 1721 07/24/2022 1924 Full Code [id number]  Synetta Fail, MD ED   07/16/2016 0756 07/17/2016 1449 Full Code [id number]  Jerre Simon, PA ED   10/14/2015 2325 10/19/2015 1526 Full Code [id number]  Pearson Grippe, MD Inpatient      Advance Directive Documentation    Flowsheet Row Most Recent Value  Type of Advance Directive Healthcare Power of Attorney, Living will  Pre-existing out of facility DNR order (yellow form or pink MOST form) --  "MOST" Form in Place? --         IV Access:   Peripheral IV   Procedures and diagnostic studies:   ECHO TEE  Result Date: 03/05/2023    TRANSESOPHOGEAL ECHO REPORT   [name] Date of Exam: 03/05/2023 Medical Rec #:  [id number]     Height:       72.0 in Accession #:    [id number]    Weight:       155.2 lb Date of Birth:  1946/01/03  BSA:          1.913 m Patient Age:    77 years      BP:           150/106 mmHg Patient Gender: M             HR:           102 bpm. Exam Location:  Inpatient Procedure: Transesophageal Echo, Cardiac Doppler and Color Doppler Indications:     Bacteremia  History:         Patient has prior history of Echocardiogram examinations, most                  recent 03/03/2023. CKD, Arrythmias:Atrial Fibrillation;                  Signs/Symptoms:Bacteremia.   Sonographer:     [name] Sonographer#2:   [name] Referring Phys:  [id number] Odette Fraction Diagnosing Phys: Donato Schultz MD PROCEDURE: After discussion of the risks and benefits of a TEE, an informed consent was obtained from the patient. The patient was intubated. The transesophogeal probe was passed without difficulty through the esophogus of the patient. Imaged were obtained with the patient in a supine position. Sedation performed by different physician. The patient was monitored while under deep sedation. Anesthestetic sedation was provided intravenously by Anesthesiology: 110mg  of Propofol, 60mg  of Lidocaine. Image quality was good. The patient's vital signs; including heart rate, blood pressure, and oxygen saturation; remained stable throughout the procedure. The patient developed no complications during the procedure.  IMPRESSIONS  1. Left ventricular ejection fraction, by estimation, is 35 to 40%. The left ventricle has moderately decreased function. The left ventricle demonstrates global hypokinesis.  2. Right ventricular systolic function is normal. The right ventricular size is normal.  3. Left atrial size was severely dilated. No left atrial/left atrial appendage thrombus was detected.  4. Right atrial size was severely dilated.  5. The mitral valve is normal in structure. Mild mitral valve regurgitation. No evidence of mitral stenosis.  6. The aortic valve is tricuspid. Aortic valve regurgitation is trivial. No aortic stenosis is present.  7. The inferior vena cava is normal in size with greater than 50% respiratory variability, suggesting right atrial pressure of 3 mmHg. Conclusion(s)/Recommendation(s): No evidence of vegetation/infective endocarditis on this transesophageael echocardiogram. FINDINGS  Left Ventricle: Left ventricular ejection fraction, by estimation, is 35 to 40%. The left ventricle has moderately decreased function. The left ventricle demonstrates global  hypokinesis. The left ventricular internal cavity size was normal in size. There is no left ventricular hypertrophy. Right Ventricle: The right ventricular size is normal. No increase in right ventricular wall thickness. Right ventricular systolic function is normal. Left Atrium: Left atrial size was severely dilated. No left atrial/left atrial appendage thrombus was detected. Right Atrium: Right atrial size was severely dilated. Pericardium: There is no evidence of pericardial effusion. Mitral Valve: The mitral valve is normal in structure. Mild mitral valve regurgitation. No evidence of mitral valve stenosis. Tricuspid Valve: The tricuspid valve is normal in structure. Tricuspid valve regurgitation is not demonstrated. No evidence of tricuspid stenosis. Aortic Valve: The aortic valve is tricuspid. Aortic valve regurgitation is trivial. No aortic stenosis is present. Pulmonic Valve: The pulmonic valve was normal in structure. Pulmonic valve regurgitation is not visualized. No evidence of pulmonic stenosis. Aorta: The aortic root is normal in size and structure. Venous: The inferior vena cava is normal in size with greater than 50% respiratory variability,  suggesting right atrial pressure of 3 mmHg. IAS/Shunts: No atrial level shunt detected by color flow Doppler. Additional Comments: Spectral Doppler performed. Donato Schultz MD Electronically signed by Donato Schultz MD Signature Date/Time: 03/05/2023/12:26:33 PM    Final    EP STUDY  Result Date: 03/05/2023 See surgical note for result.    Medical Consultants:   None.   Subjective:    Ralph Sullivan no complaints.  Objective:    Vitals:   03/05/23 1947 03/05/23 2001 03/06/23 0258 03/06/23 0359  BP: (!) 150/94 (!) 150/94 (!) 161/107 (!) 124/93  Pulse: 87 (!) 101 94 60  Resp: 16  18 16   Temp: 98.7 F (37.1 C)  97.9 F (36.6 C)   TempSrc: Oral  Oral   SpO2: 97%  99% 95%  Weight:      Height:       SpO2: 95 % O2 Flow Rate (L/min): 4  L/min   Intake/Output Summary (Last 24 hours) at 03/06/2023 0812 Last data filed at 03/06/2023 0700 Gross per 24 hour  Intake 200 ml  Output 2000 ml  Net -1800 ml   Filed Weights   02/25/23 1235 02/27/23 1500  Weight: 70.4 kg 70.4 kg    Exam: General exam: In no acute distress. Respiratory system: Good air movement and clear to auscultation. Cardiovascular system: S1 & S2 heard, RRR. No JVD. Gastrointestinal system: Abdomen is nondistended, soft and nontender.  Extremities: No pedal edema. Skin: No rashes, lesions or ulcers Psychiatry: Judgement and insight appear normal. Mood & affect appropriate. Data Reviewed:    Labs: Basic Metabolic Panel: Recent Labs  Lab 02/28/23 0353 03/01/23 0431 03/03/23 0504  NA 131* 131* 129*  K 4.5 4.3 4.1  CL 99 101 91*  CO2 23 26 26   GLUCOSE 116* 94 101*  BUN 22 20 16   CREATININE 1.42* 1.38* 1.39*  CALCIUM 9.5 9.0 9.4  MG 1.8 1.8 1.7  PHOS 3.2 3.0 3.1   GFR Estimated Creatinine Clearance: 45.7 mL/min (A) (by C-G formula based on SCr of 1.39 mg/dL (H)). Liver Function Tests: Recent Labs  Lab 03/03/23 0504  AST 21  ALT 12  ALKPHOS 53  BILITOT 0.8  PROT 6.7  ALBUMIN 3.2*   No results for input(s): "LIPASE", "AMYLASE" in the last 168 hours. No results for input(s): "AMMONIA" in the last 168 hours. Coagulation profile Recent Labs  Lab 03/02/23 0725 03/03/23 0503 03/04/23 0508 03/05/23 0350 03/06/23 0330  INR 2.7* 2.6* 3.5* 4.9* 4.3*   COVID-19 Labs  No results for input(s): "DDIMER", "FERRITIN", "LDH", "CRP" in the last 72 hours.  Lab Results  Component Value Date   SARSCOV2NAA NEGATIVE 02/25/2023    CBC: Recent Labs  Lab 02/28/23 0353 03/01/23 0431 03/03/23 0504 03/06/23 0330  WBC 5.9 4.8 6.7 6.7  NEUTROABS  --   --  5.1  --   HGB 11.5* 10.9* 12.6* 12.1*  HCT 34.8* 32.4* 38.3* 36.0*  MCV 98.9 100.0 99.2 96.5  PLT 163 148* 168 199   Cardiac Enzymes: No results for input(s): "CKTOTAL", "CKMB",  "CKMBINDEX", "TROPONINI" in the last 168 hours. BNP (last 3 results) No results for input(s): "PROBNP" in the last 8760 hours. CBG: No results for input(s): "GLUCAP" in the last 168 hours.  D-Dimer: No results for input(s): "DDIMER" in the last 72 hours. Hgb A1c: No results for input(s): "HGBA1C" in the last 72 hours. Lipid Profile: No results for input(s): "CHOL", "HDL", "LDLCALC", "TRIG", "CHOLHDL", "LDLDIRECT" in the last 72 hours. Thyroid function studies:  No results for input(s): "TSH", "T4TOTAL", "T3FREE", "THYROIDAB" in the last 72 hours.  Invalid input(s): "FREET3" Anemia work up: No results for input(s): "VITAMINB12", "FOLATE", "FERRITIN", "TIBC", "IRON", "RETICCTPCT" in the last 72 hours. Sepsis Labs: Recent Labs  Lab 02/28/23 0353 03/01/23 0431 03/03/23 0504 03/06/23 0330  WBC 5.9 4.8 6.7 6.7   Microbiology Recent Results (from the past 240 hour(s))  Resp panel by RT-PCR (RSV, Flu A&B, Covid) Anterior Nasal Swab     Status: None   Collection Time: 02/25/23  6:39 PM   Specimen: Anterior Nasal Swab  Result Value Ref Range Status   SARS Coronavirus 2 by RT PCR NEGATIVE NEGATIVE Final   Influenza A by PCR NEGATIVE NEGATIVE Final   Influenza B by PCR NEGATIVE NEGATIVE Final    Comment: (NOTE) The Xpert Xpress SARS-CoV-2/FLU/RSV plus assay is intended as an aid in the diagnosis of influenza from Nasopharyngeal swab specimens and should not be used as a sole basis for treatment. Nasal washings and aspirates are unacceptable for Xpert Xpress SARS-CoV-2/FLU/RSV testing.  Fact Sheet for Patients: BloggerCourse.com  Fact Sheet for Healthcare Providers: SeriousBroker.it  This test is not yet approved or cleared by the [place] FDA and has been authorized for detection and/or diagnosis of SARS-CoV-2 by FDA under an Emergency Use Authorization (EUA). This EUA will remain in effect (meaning this test can be used)  for the duration of the COVID-19 declaration under Section 564(b)(1) of the Act, 21 U.S.C. section 360bbb-3(b)(1), unless the authorization is terminated or revoked.     Resp Syncytial Virus by PCR NEGATIVE NEGATIVE Final    Comment: (NOTE) Fact Sheet for Patients: BloggerCourse.com  Fact Sheet for Healthcare Providers: SeriousBroker.it  This test is not yet approved or cleared by the [place] FDA and has been authorized for detection and/or diagnosis of SARS-CoV-2 by FDA under an Emergency Use Authorization (EUA). This EUA will remain in effect (meaning this test can be used) for the duration of the COVID-19 declaration under Section 564(b)(1) of the Act, 21 U.S.C. section 360bbb-3(b)(1), unless the authorization is terminated or revoked.  Performed at [facility], [address] [place], [place] [address]   Culture, blood (Routine X 2) w Reflex to ID Panel     Status: Abnormal   Collection Time: 03/01/23  5:58 PM   Specimen: BLOOD  Result Value Ref Range Status   Specimen Description BLOOD LEFT ANTECUBITAL  Final   Special Requests   Final    BOTTLES DRAWN AEROBIC AND ANAEROBIC Blood Culture adequate volume   Culture  Setup Time   Final    GRAM POSITIVE COCCI IN PAIRS AND CHAINS IN BOTH AEROBIC AND ANAEROBIC BOTTLES CRITICAL RESULT CALLED TO, READ BACK BY AND VERIFIED WITH: PHARMD J.FRENS AT 1125 ON 03/02/2023 BY T.SAAD.    Culture (A)  Final    ENTEROCOCCUS FAECALIS SUSCEPTIBILITIES PERFORMED ON PREVIOUS CULTURE WITHIN THE LAST 5 DAYS. STAPHYLOCOCCUS EPIDERMIDIS THE SIGNIFICANCE OF ISOLATING THIS ORGANISM FROM A SINGLE SET OF BLOOD CULTURES WHEN MULTIPLE SETS ARE DRAWN IS UNCERTAIN. PLEASE NOTIFY THE MICROBIOLOGY DEPARTMENT WITHIN ONE WEEK IF SPECIATION AND SENSITIVITIES ARE REQUIRED. Performed at [facility], [address], [place], [place] [address]    Report Status 03/04/2023 FINAL  Final  Blood  Culture ID Panel (Reflexed)     Status: Abnormal   Collection Time: 03/01/23  5:58 PM  Result Value Ref Range Status   Enterococcus faecalis DETECTED (A) NOT DETECTED Final    Comment: CRITICAL  RESULT CALLED TO, READ BACK BY AND VERIFIED WITH: PHARMD J.FRENS AT 1125 ON 03/02/2023 BY T.SAAD.    Enterococcus Faecium NOT DETECTED NOT DETECTED Final   Listeria monocytogenes NOT DETECTED NOT DETECTED Final   Staphylococcus species DETECTED (A) NOT DETECTED Final    Comment: CRITICAL RESULT CALLED TO, READ BACK BY AND VERIFIED WITH: PHARMD J.FRENS AT 1125 ON 03/02/2023 BY T.SAAD.    Staphylococcus aureus (BCID) NOT DETECTED NOT DETECTED Final   Staphylococcus epidermidis DETECTED (A) NOT DETECTED Final    Comment: CRITICAL RESULT CALLED TO, READ BACK BY AND VERIFIED WITH: PHARMD J.FRENS AT 1125 ON 03/02/2023 BY T.SAAD.    Staphylococcus lugdunensis NOT DETECTED NOT DETECTED Final   Streptococcus species NOT DETECTED NOT DETECTED Final   Streptococcus agalactiae NOT DETECTED NOT DETECTED Final   Streptococcus pneumoniae NOT DETECTED NOT DETECTED Final   Streptococcus pyogenes NOT DETECTED NOT DETECTED Final   A.calcoaceticus-baumannii NOT DETECTED NOT DETECTED Final   Bacteroides fragilis NOT DETECTED NOT DETECTED Final   Enterobacterales NOT DETECTED NOT DETECTED Final   Enterobacter cloacae complex NOT DETECTED NOT DETECTED Final   Escherichia coli NOT DETECTED NOT DETECTED Final   Klebsiella aerogenes NOT DETECTED NOT DETECTED Final   Klebsiella oxytoca NOT DETECTED NOT DETECTED Final   Klebsiella pneumoniae NOT DETECTED NOT DETECTED Final   Proteus species NOT DETECTED NOT DETECTED Final   Salmonella species NOT DETECTED NOT DETECTED Final   Serratia marcescens NOT DETECTED NOT DETECTED Final   Haemophilus influenzae NOT DETECTED NOT DETECTED Final   Neisseria meningitidis NOT DETECTED NOT DETECTED Final   Pseudomonas aeruginosa NOT DETECTED NOT DETECTED Final   Stenotrophomonas  maltophilia NOT DETECTED NOT DETECTED Final   Candida albicans NOT DETECTED NOT DETECTED Final   Candida auris NOT DETECTED NOT DETECTED Final   Candida glabrata NOT DETECTED NOT DETECTED Final   Candida krusei NOT DETECTED NOT DETECTED Final   Candida parapsilosis NOT DETECTED NOT DETECTED Final   Candida tropicalis NOT DETECTED NOT DETECTED Final   Cryptococcus neoformans/gattii NOT DETECTED NOT DETECTED Final   Methicillin resistance mecA/C NOT DETECTED NOT DETECTED Final   Vancomycin resistance NOT DETECTED NOT DETECTED Final    Comment: Performed at [facility], [address] [place], [place] [address]  Culture, blood (Routine X 2) w Reflex to ID Panel     Status: Abnormal   Collection Time: 03/01/23  6:07 PM   Specimen: BLOOD LEFT ARM  Result Value Ref Range Status   Specimen Description BLOOD LEFT ARM  Final   Special Requests   Final    BOTTLES DRAWN AEROBIC AND ANAEROBIC Blood Culture adequate volume   Culture  Setup Time   Final    GRAM POSITIVE COCCI IN CHAINS IN BOTH AEROBIC AND ANAEROBIC BOTTLES CRITICAL VALUE NOTED.  VALUE IS CONSISTENT WITH PREVIOUSLY REPORTED AND CALLED VALUE. Performed at [facility], [address] [place], [place] [address]    Culture ENTEROCOCCUS FAECALIS (A)  Final   Report Status 03/04/2023 FINAL  Final   Organism ID, Bacteria ENTEROCOCCUS FAECALIS  Final      Susceptibility   Enterococcus faecalis - MIC*    AMPICILLIN <=2 SENSITIVE Sensitive     VANCOMYCIN 1 SENSITIVE Sensitive     GENTAMICIN SYNERGY SENSITIVE Sensitive     * ENTEROCOCCUS FAECALIS  Urine Culture (for pregnant, neutropenic or urologic patients or patients with an indwelling urinary catheter)     Status: Abnormal   Collection Time: 03/01/23 11:30 PM  Specimen: Urine, Clean Catch  Result Value Ref Range Status   Specimen Description URINE, CLEAN CATCH  Final   Special Requests   Final    NONE Performed at [facility], [address]  [place], [place] [address]    Culture (A)  Final    >=100,000 COLONIES/mL ENTEROCOCCUS FAECALIS 40,000 COLONIES/mL KLEBSIELLA OXYTOCA    Report Status 03/04/2023 FINAL  Final   Organism ID, Bacteria ENTEROCOCCUS FAECALIS (A)  Final   Organism ID, Bacteria KLEBSIELLA OXYTOCA (A)  Final      Susceptibility   Enterococcus faecalis - MIC*    AMPICILLIN <=2 SENSITIVE Sensitive     NITROFURANTOIN <=16 SENSITIVE Sensitive     VANCOMYCIN 1 SENSITIVE Sensitive     * >=100,000 COLONIES/mL ENTEROCOCCUS FAECALIS   Klebsiella oxytoca - MIC*    AMPICILLIN >=32 RESISTANT Resistant     CEFEPIME <=0.12 SENSITIVE Sensitive     CEFTRIAXONE <=0.25 SENSITIVE Sensitive     CIPROFLOXACIN <=0.25 SENSITIVE Sensitive     GENTAMICIN <=1 SENSITIVE Sensitive     IMIPENEM <=0.25 SENSITIVE Sensitive     NITROFURANTOIN 32 SENSITIVE Sensitive     TRIMETH/SULFA <=20 SENSITIVE Sensitive     AMPICILLIN/SULBACTAM 16 INTERMEDIATE Intermediate     PIP/TAZO <=4 SENSITIVE Sensitive ug/mL    * 40,000 COLONIES/mL KLEBSIELLA OXYTOCA  Culture, blood (Routine X 2) w Reflex to ID Panel     Status: None (Preliminary result)   Collection Time: 03/03/23  5:04 AM   Specimen: BLOOD  Result Value Ref Range Status   Specimen Description BLOOD BLOOD RIGHT ARM  Final   Special Requests   Final    BOTTLES DRAWN AEROBIC AND ANAEROBIC Blood Culture adequate volume   Culture   Final    NO GROWTH 2 DAYS Performed at [facility], [address] [place], [place] [address]    Report Status PENDING  Incomplete  Culture, blood (Routine X 2) w Reflex to ID Panel     Status: None (Preliminary result)   Collection Time: 03/03/23  5:06 AM   Specimen: BLOOD  Result Value Ref Range Status   Specimen Description BLOOD BLOOD RIGHT ARM  Final   Special Requests   Final    BOTTLES DRAWN AEROBIC AND ANAEROBIC Blood Culture adequate volume   Culture   Final    NO GROWTH 2 DAYS Performed at [facility], [address], [place],  [place] [address]    Report Status PENDING  Incomplete     Medications:    fluticasone  2 spray Each Nare Daily   lidocaine  1 patch Transdermal Q24H   metoprolol tartrate  25 mg Oral BID   pantoprazole (PROTONIX) IV  40 mg Intravenous Q24H   Warfarin - Pharmacist Dosing Inpatient   Does not apply q1600   Continuous Infusions:  ampicillin (OMNIPEN) IV 2 g (03/06/23 0628)      LOS: 6 days   Marinda Elk  [facility]  03/06/2023, 8:12 AM

## 2023-03-07 DIAGNOSIS — R531 Weakness: Secondary | ICD-10-CM | POA: Diagnosis not present

## 2023-03-07 DIAGNOSIS — G6289 Other specified polyneuropathies: Secondary | ICD-10-CM | POA: Diagnosis not present

## 2023-03-07 DIAGNOSIS — I4811 Longstanding persistent atrial fibrillation: Secondary | ICD-10-CM | POA: Diagnosis not present

## 2023-03-07 DIAGNOSIS — I4891 Unspecified atrial fibrillation: Secondary | ICD-10-CM | POA: Diagnosis not present

## 2023-03-07 LAB — PROTIME-INR
INR: 2.7 — ABNORMAL HIGH (ref 0.8–1.2)
Prothrombin Time: 29 s — ABNORMAL HIGH (ref 11.4–15.2)

## 2023-03-07 MED ORDER — SORBITOL 70 % SOLN
960.0000 mL | TOPICAL_OIL | Freq: Once | ORAL | Status: AC
  Administered 2023-03-07: 960 mL via RECTAL
  Filled 2023-03-07: qty 240

## 2023-03-07 MED ORDER — WARFARIN SODIUM 2.5 MG PO TABS
2.5000 mg | ORAL_TABLET | Freq: Once | ORAL | Status: AC
  Administered 2023-03-07: 2.5 mg via ORAL
  Filled 2023-03-07 (×2): qty 1

## 2023-03-07 MED ORDER — SORBITOL 70 % SOLN
30.0000 mL | Freq: Once | Status: DC
  Filled 2023-03-07: qty 30

## 2023-03-07 MED ORDER — DOCUSATE SODIUM 283 MG RE ENEM
1.0000 | ENEMA | Freq: Once | RECTAL | Status: DC
  Filled 2023-03-07: qty 1

## 2023-03-07 MED ORDER — POLYETHYLENE GLYCOL 3350 17 G PO PACK
17.0000 g | PACK | Freq: Two times a day (BID) | ORAL | Status: DC
  Administered 2023-03-07 (×2): 17 g via ORAL
  Filled 2023-03-07 (×3): qty 1

## 2023-03-07 NOTE — Plan of Care (Signed)
  Problem: Pain Management: Goal: General experience of comfort will improve Outcome: Progressing   Problem: Safety: Goal: Ability to remain free from injury will improve Outcome: Progressing

## 2023-03-07 NOTE — Progress Notes (Signed)
PHARMACY - ANTICOAGULATION CONSULT NOTE  Pharmacy Consult for Warfarin Indication: atrial fibrillation  Allergies  Allergen Reactions   Allopurinol Other (See Comments)   Colchicine Other (See Comments)   Duloxetine Other (See Comments)    More nervous   Elemental Sulfur Nausea And Vomiting   Oxycontin [Oxycodone Hcl] Nausea And Vomiting    Patient Measurements: Height: 6' (182.9 cm) Weight: 70.4 kg (155 lb 3.3 oz) IBW/kg (Calculated) : 77.6 Heparin dosing weight: 70.4 kg  Vital Signs: Temp: 97.8 F (36.6 C) (11/10 0748) Temp Source: Oral (11/10 0748) BP: 104/89 (11/10 0748) Pulse Rate: 84 (11/09 2056)  Labs: Recent Labs    03/05/23 0350 03/06/23 0330 03/07/23 0411  HGB  --  12.1*  --   HCT  --  36.0*  --   PLT  --  199  --   LABPROT 46.5* 41.6* 29.0*  INR 4.9* 4.3* 2.7*    Estimated Creatinine Clearance: 45.7 mL/min (A) (by C-G formula based on SCr of 1.39 mg/dL (H)).   Medical History: Past Medical History:  Diagnosis Date   Atrial fibrillation (HCC) 02/08/2013   CHADS-VAS - 2 (AGE, HTN). On anticoagulation. Holter 6/14 - 2.3 sec pause. Avg HR 87. 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 Pt has hx AF and CVA and is on warfarin PTA. Warfarin last dose given 11/2, then held with possible SBO. Warfarin resumed 11/5 as there are no plans for procedures  -INR= 2.6> 3.5> 4.9 > 4.3> 2.7 -increased warfarin sensitivity possibly due to recent SBO (low po intake) and antibiotics  11/10: INR now down trending to 2.7. CBC  stable (Hgb 12.1, Plt 199).  Home warfarin dose: 5mg  daily (per patient)  Goal of Therapy:  INR 2-3 Monitor platelets by anticoagulation protocol: Yes   Plan:  Warfarin 2.5 mg x1 -Daily PT/INR -recheck CBC tomorrow AM  Enos Fling, PharmD PGY-1 Acute Care Pharmacy Resident 03/07/2023 8:44 AM

## 2023-03-07 NOTE — Progress Notes (Signed)
Mobility Specialist Progress Note:   03/07/23 1306  Mobility  Activity Transferred from bed to chair  Level of Assistance Minimal assist, patient does 75% or more (+2)  Assistive Device Front wheel walker  Distance Ambulated (ft) 3 ft  Activity Response Tolerated well  Mobility Referral Yes  $Mobility charge 1 Mobility  Mobility Specialist Start Time (ACUTE ONLY) 1215  Mobility Specialist Stop Time (ACUTE ONLY) 1225  Mobility Specialist Time Calculation (min) (ACUTE ONLY) 10 min   Pre Mobility: 85 HR  During Mobility: 162 HR  Post Mobility: 105 HR  Pt received in bed, agreeable to transfer to chair. SB bed mobility. MinA to stand and pivot from elevated bed height. NT present to assist with transfer but +2 was not needed. HR peaked at 162 bpm during mobility. Pt asymptomatic throughout session. Pt left in chair with NT still present in room. RN notified.   Leory Plowman  Mobility Specialist Please contact via [employer] office at [phone]

## 2023-03-07 NOTE — Progress Notes (Signed)
[facility] PROGRESS NOTE    Progress Note  Ralph Sullivan  JJO:[id number] DOB: 1946/04/07 DOA: 02/25/2023 PCP: Lorenda Ishihara, MD     Brief Narrative:   Ralph Sullivan is an 77 y.o. male past medical history of A-fib on Coumadin chronic kidney disease stage IIIa, CVA comes in with generalized weakness complicated by A-fib with RVR and found to have Enterococcus faecalis bacteremia.  Awaiting skilled nursing facility placement.   Assessment/Plan:   Enterococcus faecalis bacteremia: Likely source genitourinary BC ID positive for Enterococcus, surveillance blood culture on 03/02/2025 negative till date. 2D echo showed an EF of 45%, left ventricular global hypokinesia, no vegetation appreciated. TEE showed an EF of 40% there is no evidence of vegetation. ID recommended to transition to oral amoxicillin which she will continue as an outpatient for sensitive to ampicillin He will continue ampicillin orally as an outpatient end date is 03/16/2023 follow-up with infectious disease on 03/18/2023  Mild left hydroureteronephrosis: Urine cultures showed greater than 100,000 colonies of Enterococcus faecalis. Follow-up with urology as an outpatient.  Small bowel obstruction/small bowel lesion: CT scan of the abdomen pelvis showed partially resolving SBO oral contrast in cecum.  Previously described transition point is no longer appreciated. Surgery on board, have advance diet which she has been tolerated. Resume Coumadin.  A-fib with RVR: Continue metoprolol and Coumadin. Heart rate controlled. Resume Coumadin INR 2.7.  Sinus bradycardia: Clonidine hold. Heart rate now has been fast and metoprolol has been resumed at low-dose okay to increase.  Generalized weakness and deconditioning: Probably related to cardiac condition and age related. PT OT has been consulted recommended skilled nursing facility.  Essential hypertension/chronic systolic heart failure: Clonidine was  held as he was having 3-second pauses. Continue metoprolol.  Chronic kidney disease stage IIIa: With back creatinine at baseline.  Constipation: As per patient has not had a bowel movement in 7 days he is just passing gas and had 1 small real hard bowel movement. Give him a smog MiraLAX p.o. twice daily.  DVT prophylaxis: coumadin Family Communication:none Status is: Inpatient Remains inpatient appropriate because: Enterococcus faecalis bacteremia    Code Status:     Code Status Orders  (From admission, onward)           Start     Ordered   02/26/23 1526  Full code  Continuous       Question:  By:  Answer:  Consent: discussion documented in EHR   02/26/23 1526           Code Status History     Date Active Date Inactive Code Status Order ID Comments User Context   07/23/2022 1721 07/24/2022 1924 Full Code [id number]  Synetta Fail, MD ED   07/16/2016 0756 07/17/2016 1449 Full Code [id number]  Jerre Simon, PA ED   10/14/2015 2325 10/19/2015 1526 Full Code [id number]  Pearson Grippe, MD Inpatient      Advance Directive Documentation    Flowsheet Row Most Recent Value  Type of Advance Directive Healthcare Power of Attorney, Living will  Pre-existing out of facility DNR order (yellow form or pink MOST form) --  "MOST" Form in Place? --         IV Access:   Peripheral IV   Procedures and diagnostic studies:   ECHO TEE  Result Date: 03/05/2023    TRANSESOPHOGEAL ECHO REPORT   [name] Date of Exam: 03/05/2023 Medical Rec #:  [id number]     Height:  72.0 in Accession #:    [id number]    Weight:       155.2 lb Date of Birth:  1946-03-06      BSA:          1.913 m Patient Age:    77 years      BP:           150/106 mmHg Patient Gender: M             HR:           102 bpm. Exam Location:  Inpatient Procedure: Transesophageal Echo, Cardiac Doppler and Color Doppler Indications:     Bacteremia  History:         Patient has prior history of  Echocardiogram examinations, most                  recent 03/03/2023. CKD, Arrythmias:Atrial Fibrillation;                  Signs/Symptoms:Bacteremia.  Sonographer:     [name] Sonographer#2:   [name] Referring Phys:  [id number] Odette Fraction Diagnosing Phys: Donato Schultz MD PROCEDURE: After discussion of the risks and benefits of a TEE, an informed consent was obtained from the patient. The patient was intubated. The transesophogeal probe was passed without difficulty through the esophogus of the patient. Imaged were obtained with the patient in a supine position. Sedation performed by different physician. The patient was monitored while under deep sedation. Anesthestetic sedation was provided intravenously by Anesthesiology: 110mg  of Propofol, 60mg  of Lidocaine. Image quality was good. The patient's vital signs; including heart rate, blood pressure, and oxygen saturation; remained stable throughout the procedure. The patient developed no complications during the procedure.  IMPRESSIONS  1. Left ventricular ejection fraction, by estimation, is 35 to 40%. The left ventricle has moderately decreased function. The left ventricle demonstrates global hypokinesis.  2. Right ventricular systolic function is normal. The right ventricular size is normal.  3. Left atrial size was severely dilated. No left atrial/left atrial appendage thrombus was detected.  4. Right atrial size was severely dilated.  5. The mitral valve is normal in structure. Mild mitral valve regurgitation. No evidence of mitral stenosis.  6. The aortic valve is tricuspid. Aortic valve regurgitation is trivial. No aortic stenosis is present.  7. The inferior vena cava is normal in size with greater than 50% respiratory variability, suggesting right atrial pressure of 3 mmHg. Conclusion(s)/Recommendation(s): No evidence of vegetation/infective endocarditis on this transesophageael echocardiogram. FINDINGS  Left Ventricle: Left  ventricular ejection fraction, by estimation, is 35 to 40%. The left ventricle has moderately decreased function. The left ventricle demonstrates global hypokinesis. The left ventricular internal cavity size was normal in size. There is no left ventricular hypertrophy. Right Ventricle: The right ventricular size is normal. No increase in right ventricular wall thickness. Right ventricular systolic function is normal. Left Atrium: Left atrial size was severely dilated. No left atrial/left atrial appendage thrombus was detected. Right Atrium: Right atrial size was severely dilated. Pericardium: There is no evidence of pericardial effusion. Mitral Valve: The mitral valve is normal in structure. Mild mitral valve regurgitation. No evidence of mitral valve stenosis. Tricuspid Valve: The tricuspid valve is normal in structure. Tricuspid valve regurgitation is not demonstrated. No evidence of tricuspid stenosis. Aortic Valve: The aortic valve is tricuspid. Aortic valve regurgitation is trivial. No aortic stenosis is present. Pulmonic Valve: The pulmonic valve was normal in structure. Pulmonic valve regurgitation is not visualized.  No evidence of pulmonic stenosis. Aorta: The aortic root is normal in size and structure. Venous: The inferior vena cava is normal in size with greater than 50% respiratory variability, suggesting right atrial pressure of 3 mmHg. IAS/Shunts: No atrial level shunt detected by color flow Doppler. Additional Comments: Spectral Doppler performed. Donato Schultz MD Electronically signed by Donato Schultz MD Signature Date/Time: 03/05/2023/12:26:33 PM    Final    EP STUDY  Result Date: 03/05/2023 See surgical note for result.    Medical Consultants:   None.   Subjective:    Sherod K Barasch no complaints.  Objective:    Vitals:   03/06/23 1953 03/06/23 2056 03/07/23 0531 03/07/23 0748  BP: (!) 134/96 125/79 103/74 104/89  Pulse: 77 84    Resp: 20  16   Temp: 98.2 F (36.8 C)  97.6 F  (36.4 C) 97.8 F (36.6 C)  TempSrc: Oral  Oral Oral  SpO2: 94%  98%   Weight:      Height:       SpO2: 98 % O2 Flow Rate (L/min): 4 L/min   Intake/Output Summary (Last 24 hours) at 03/07/2023 0820 Last data filed at 03/06/2023 2000 Gross per 24 hour  Intake --  Output 1050 ml  Net -1050 ml   Filed Weights   02/25/23 1235 02/27/23 1500  Weight: 70.4 kg 70.4 kg    Exam: General exam: In no acute distress. Respiratory system: Good air movement and clear to auscultation. Cardiovascular system: S1 & S2 heard, RRR. No JVD. Gastrointestinal system: Abdomen is nondistended, soft and nontender.  Extremities: No pedal edema. Skin: No rashes, lesions or ulcers Psychiatry: Judgement and insight appear normal. Mood & affect appropriate. Data Reviewed:    Labs: Basic Metabolic Panel: Recent Labs  Lab 03/01/23 0431 03/03/23 0504  NA 131* 129*  K 4.3 4.1  CL 101 91*  CO2 26 26  GLUCOSE 94 101*  BUN 20 16  CREATININE 1.38* 1.39*  CALCIUM 9.0 9.4  MG 1.8 1.7  PHOS 3.0 3.1   GFR Estimated Creatinine Clearance: 45.7 mL/min (A) (by C-G formula based on SCr of 1.39 mg/dL (H)). Liver Function Tests: Recent Labs  Lab 03/03/23 0504  AST 21  ALT 12  ALKPHOS 53  BILITOT 0.8  PROT 6.7  ALBUMIN 3.2*   No results for input(s): "LIPASE", "AMYLASE" in the last 168 hours. No results for input(s): "AMMONIA" in the last 168 hours. Coagulation profile Recent Labs  Lab 03/03/23 0503 03/04/23 0508 03/05/23 0350 03/06/23 0330 03/07/23 0411  INR 2.6* 3.5* 4.9* 4.3* 2.7*   COVID-19 Labs  No results for input(s): "DDIMER", "FERRITIN", "LDH", "CRP" in the last 72 hours.  Lab Results  Component Value Date   SARSCOV2NAA NEGATIVE 02/25/2023    CBC: Recent Labs  Lab 03/01/23 0431 03/03/23 0504 03/06/23 0330  WBC 4.8 6.7 6.7  NEUTROABS  --  5.1  --   HGB 10.9* 12.6* 12.1*  HCT 32.4* 38.3* 36.0*  MCV 100.0 99.2 96.5  PLT 148* 168 199   Cardiac Enzymes: No results for  input(s): "CKTOTAL", "CKMB", "CKMBINDEX", "TROPONINI" in the last 168 hours. BNP (last 3 results) No results for input(s): "PROBNP" in the last 8760 hours. CBG: No results for input(s): "GLUCAP" in the last 168 hours.  D-Dimer: No results for input(s): "DDIMER" in the last 72 hours. Hgb A1c: No results for input(s): "HGBA1C" in the last 72 hours. Lipid Profile: No results for input(s): "CHOL", "HDL", "LDLCALC", "TRIG", "CHOLHDL", "LDLDIRECT" in the  last 72 hours. Thyroid function studies: No results for input(s): "TSH", "T4TOTAL", "T3FREE", "THYROIDAB" in the last 72 hours.  Invalid input(s): "FREET3" Anemia work up: No results for input(s): "VITAMINB12", "FOLATE", "FERRITIN", "TIBC", "IRON", "RETICCTPCT" in the last 72 hours. Sepsis Labs: Recent Labs  Lab 03/01/23 0431 03/03/23 0504 03/06/23 0330  WBC 4.8 6.7 6.7   Microbiology Recent Results (from the past 240 hour(s))  Resp panel by RT-PCR (RSV, Flu A&B, Covid) Anterior Nasal Swab     Status: None   Collection Time: 02/25/23  6:39 PM   Specimen: Anterior Nasal Swab  Result Value Ref Range Status   SARS Coronavirus 2 by RT PCR NEGATIVE NEGATIVE Final   Influenza A by PCR NEGATIVE NEGATIVE Final   Influenza B by PCR NEGATIVE NEGATIVE Final    Comment: (NOTE) The Xpert Xpress SARS-CoV-2/FLU/RSV plus assay is intended as an aid in the diagnosis of influenza from Nasopharyngeal swab specimens and should not be used as a sole basis for treatment. Nasal washings and aspirates are unacceptable for Xpert Xpress SARS-CoV-2/FLU/RSV testing.  Fact Sheet for Patients: BloggerCourse.com  Fact Sheet for Healthcare Providers: SeriousBroker.it  This test is not yet approved or cleared by the [place] FDA and has been authorized for detection and/or diagnosis of SARS-CoV-2 by FDA under an Emergency Use Authorization (EUA). This EUA will remain in effect (meaning this test can  be used) for the duration of the COVID-19 declaration under Section 564(b)(1) of the Act, 21 U.S.C. section 360bbb-3(b)(1), unless the authorization is terminated or revoked.     Resp Syncytial Virus by PCR NEGATIVE NEGATIVE Final    Comment: (NOTE) Fact Sheet for Patients: BloggerCourse.com  Fact Sheet for Healthcare Providers: SeriousBroker.it  This test is not yet approved or cleared by the [place] FDA and has been authorized for detection and/or diagnosis of SARS-CoV-2 by FDA under an Emergency Use Authorization (EUA). This EUA will remain in effect (meaning this test can be used) for the duration of the COVID-19 declaration under Section 564(b)(1) of the Act, 21 U.S.C. section 360bbb-3(b)(1), unless the authorization is terminated or revoked.  Performed at [facility], [address] [place], [place] [address]   Culture, blood (Routine X 2) w Reflex to ID Panel     Status: Abnormal   Collection Time: 03/01/23  5:58 PM   Specimen: BLOOD  Result Value Ref Range Status   Specimen Description BLOOD LEFT ANTECUBITAL  Final   Special Requests   Final    BOTTLES DRAWN AEROBIC AND ANAEROBIC Blood Culture adequate volume   Culture  Setup Time   Final    GRAM POSITIVE COCCI IN PAIRS AND CHAINS IN BOTH AEROBIC AND ANAEROBIC BOTTLES CRITICAL RESULT CALLED TO, READ BACK BY AND VERIFIED WITH: PHARMD J.FRENS AT 1125 ON 03/02/2023 BY T.SAAD.    Culture (A)  Final    ENTEROCOCCUS FAECALIS SUSCEPTIBILITIES PERFORMED ON PREVIOUS CULTURE WITHIN THE LAST 5 DAYS. STAPHYLOCOCCUS EPIDERMIDIS THE SIGNIFICANCE OF ISOLATING THIS ORGANISM FROM A SINGLE SET OF BLOOD CULTURES WHEN MULTIPLE SETS ARE DRAWN IS UNCERTAIN. PLEASE NOTIFY THE MICROBIOLOGY DEPARTMENT WITHIN ONE WEEK IF SPECIATION AND SENSITIVITIES ARE REQUIRED. Performed at [facility], [address] [place], [place] [address]    Report Status 03/04/2023 FINAL  Final   Blood Culture ID Panel (Reflexed)     Status: Abnormal   Collection Time: 03/01/23  5:58 PM  Result Value Ref Range Status   Enterococcus faecalis DETECTED (A) NOT DETECTED Final  Comment: CRITICAL RESULT CALLED TO, READ BACK BY AND VERIFIED WITH: PHARMD J.FRENS AT 1125 ON 03/02/2023 BY T.SAAD.    Enterococcus Faecium NOT DETECTED NOT DETECTED Final   Listeria monocytogenes NOT DETECTED NOT DETECTED Final   Staphylococcus species DETECTED (A) NOT DETECTED Final    Comment: CRITICAL RESULT CALLED TO, READ BACK BY AND VERIFIED WITH: PHARMD J.FRENS AT 1125 ON 03/02/2023 BY T.SAAD.    Staphylococcus aureus (BCID) NOT DETECTED NOT DETECTED Final   Staphylococcus epidermidis DETECTED (A) NOT DETECTED Final    Comment: CRITICAL RESULT CALLED TO, READ BACK BY AND VERIFIED WITH: PHARMD J.FRENS AT 1125 ON 03/02/2023 BY T.SAAD.    Staphylococcus lugdunensis NOT DETECTED NOT DETECTED Final   Streptococcus species NOT DETECTED NOT DETECTED Final   Streptococcus agalactiae NOT DETECTED NOT DETECTED Final   Streptococcus pneumoniae NOT DETECTED NOT DETECTED Final   Streptococcus pyogenes NOT DETECTED NOT DETECTED Final   A.calcoaceticus-baumannii NOT DETECTED NOT DETECTED Final   Bacteroides fragilis NOT DETECTED NOT DETECTED Final   Enterobacterales NOT DETECTED NOT DETECTED Final   Enterobacter cloacae complex NOT DETECTED NOT DETECTED Final   Escherichia coli NOT DETECTED NOT DETECTED Final   Klebsiella aerogenes NOT DETECTED NOT DETECTED Final   Klebsiella oxytoca NOT DETECTED NOT DETECTED Final   Klebsiella pneumoniae NOT DETECTED NOT DETECTED Final   Proteus species NOT DETECTED NOT DETECTED Final   Salmonella species NOT DETECTED NOT DETECTED Final   Serratia marcescens NOT DETECTED NOT DETECTED Final   Haemophilus influenzae NOT DETECTED NOT DETECTED Final   Neisseria meningitidis NOT DETECTED NOT DETECTED Final   Pseudomonas aeruginosa NOT DETECTED NOT DETECTED Final    Stenotrophomonas maltophilia NOT DETECTED NOT DETECTED Final   Candida albicans NOT DETECTED NOT DETECTED Final   Candida auris NOT DETECTED NOT DETECTED Final   Candida glabrata NOT DETECTED NOT DETECTED Final   Candida krusei NOT DETECTED NOT DETECTED Final   Candida parapsilosis NOT DETECTED NOT DETECTED Final   Candida tropicalis NOT DETECTED NOT DETECTED Final   Cryptococcus neoformans/gattii NOT DETECTED NOT DETECTED Final   Methicillin resistance mecA/C NOT DETECTED NOT DETECTED Final   Vancomycin resistance NOT DETECTED NOT DETECTED Final    Comment: Performed at [facility], [address] [place], [place] [address]  Culture, blood (Routine X 2) w Reflex to ID Panel     Status: Abnormal   Collection Time: 03/01/23  6:07 PM   Specimen: BLOOD LEFT ARM  Result Value Ref Range Status   Specimen Description BLOOD LEFT ARM  Final   Special Requests   Final    BOTTLES DRAWN AEROBIC AND ANAEROBIC Blood Culture adequate volume   Culture  Setup Time   Final    GRAM POSITIVE COCCI IN CHAINS IN BOTH AEROBIC AND ANAEROBIC BOTTLES CRITICAL VALUE NOTED.  VALUE IS CONSISTENT WITH PREVIOUSLY REPORTED AND CALLED VALUE. Performed at [facility], [address] [place], [place] [address]    Culture ENTEROCOCCUS FAECALIS (A)  Final   Report Status 03/04/2023 FINAL  Final   Organism ID, Bacteria ENTEROCOCCUS FAECALIS  Final      Susceptibility   Enterococcus faecalis - MIC*    AMPICILLIN <=2 SENSITIVE Sensitive     VANCOMYCIN 1 SENSITIVE Sensitive     GENTAMICIN SYNERGY SENSITIVE Sensitive     * ENTEROCOCCUS FAECALIS  Urine Culture (for pregnant, neutropenic or urologic patients or patients with an indwelling urinary catheter)     Status: Abnormal   Collection Time: 03/01/23  11:30 PM   Specimen: Urine, Clean Catch  Result Value Ref Range Status   Specimen Description URINE, CLEAN CATCH  Final   Special Requests   Final    NONE Performed at [facility], [address], [place], [place] [address]    Culture (A)  Final    >=100,000 COLONIES/mL ENTEROCOCCUS FAECALIS 40,000 COLONIES/mL KLEBSIELLA OXYTOCA    Report Status 03/04/2023 FINAL  Final   Organism ID, Bacteria ENTEROCOCCUS FAECALIS (A)  Final   Organism ID, Bacteria KLEBSIELLA OXYTOCA (A)  Final      Susceptibility   Enterococcus faecalis - MIC*    AMPICILLIN <=2 SENSITIVE Sensitive     NITROFURANTOIN <=16 SENSITIVE Sensitive     VANCOMYCIN 1 SENSITIVE Sensitive     * >=100,000 COLONIES/mL ENTEROCOCCUS FAECALIS   Klebsiella oxytoca - MIC*    AMPICILLIN >=32 RESISTANT Resistant     CEFEPIME <=0.12 SENSITIVE Sensitive     CEFTRIAXONE <=0.25 SENSITIVE Sensitive     CIPROFLOXACIN <=0.25 SENSITIVE Sensitive     GENTAMICIN <=1 SENSITIVE Sensitive     IMIPENEM <=0.25 SENSITIVE Sensitive     NITROFURANTOIN 32 SENSITIVE Sensitive     TRIMETH/SULFA <=20 SENSITIVE Sensitive     AMPICILLIN/SULBACTAM 16 INTERMEDIATE Intermediate     PIP/TAZO <=4 SENSITIVE Sensitive ug/mL    * 40,000 COLONIES/mL KLEBSIELLA OXYTOCA  Culture, blood (Routine X 2) w Reflex to ID Panel     Status: None (Preliminary result)   Collection Time: 03/03/23  5:04 AM   Specimen: BLOOD  Result Value Ref Range Status   Specimen Description BLOOD BLOOD RIGHT ARM  Final   Special Requests   Final    BOTTLES DRAWN AEROBIC AND ANAEROBIC Blood Culture adequate volume   Culture   Final    NO GROWTH 3 DAYS Performed at [facility], [address] [place], [place] [address]    Report Status PENDING  Incomplete  Culture, blood (Routine X 2) w Reflex to ID Panel     Status: None (Preliminary result)   Collection Time: 03/03/23  5:06 AM   Specimen: BLOOD  Result Value Ref Range Status   Specimen Description BLOOD BLOOD RIGHT ARM  Final   Special Requests   Final    BOTTLES DRAWN AEROBIC AND ANAEROBIC Blood Culture adequate volume   Culture   Final    NO GROWTH 3 DAYS Performed at [facility], [address]  [place], [place] [address]    Report Status PENDING  Incomplete     Medications:    cloNIDine  0.1 mg Oral TID   fluticasone  2 spray Each Nare Daily   lidocaine  1 patch Transdermal Q24H   metoprolol tartrate  25 mg Oral BID   pantoprazole (PROTONIX) IV  40 mg Intravenous Q24H   Warfarin - Pharmacist Dosing Inpatient   Does not apply q1600   Continuous Infusions:  ampicillin (OMNIPEN) IV 2 g (03/07/23 0531)      LOS: 7 days   Marinda Elk  [facility]  03/07/2023, 8:20 AM

## 2023-03-08 ENCOUNTER — Encounter (HOSPITAL_COMMUNITY): Payer: Self-pay | Admitting: Cardiology

## 2023-03-08 DIAGNOSIS — L700 Acne vulgaris: Secondary | ICD-10-CM | POA: Diagnosis not present

## 2023-03-08 DIAGNOSIS — M6281 Muscle weakness (generalized): Secondary | ICD-10-CM | POA: Diagnosis not present

## 2023-03-08 DIAGNOSIS — I639 Cerebral infarction, unspecified: Secondary | ICD-10-CM | POA: Diagnosis not present

## 2023-03-08 DIAGNOSIS — T7840XD Allergy, unspecified, subsequent encounter: Secondary | ICD-10-CM | POA: Diagnosis not present

## 2023-03-08 DIAGNOSIS — Z7401 Bed confinement status: Secondary | ICD-10-CM | POA: Diagnosis not present

## 2023-03-08 DIAGNOSIS — B952 Enterococcus as the cause of diseases classified elsewhere: Secondary | ICD-10-CM | POA: Diagnosis not present

## 2023-03-08 DIAGNOSIS — R7881 Bacteremia: Secondary | ICD-10-CM | POA: Diagnosis not present

## 2023-03-08 DIAGNOSIS — I10 Essential (primary) hypertension: Secondary | ICD-10-CM | POA: Diagnosis not present

## 2023-03-08 DIAGNOSIS — I4891 Unspecified atrial fibrillation: Secondary | ICD-10-CM | POA: Diagnosis not present

## 2023-03-08 DIAGNOSIS — N1330 Unspecified hydronephrosis: Secondary | ICD-10-CM | POA: Diagnosis not present

## 2023-03-08 DIAGNOSIS — I5022 Chronic systolic (congestive) heart failure: Secondary | ICD-10-CM | POA: Diagnosis not present

## 2023-03-08 DIAGNOSIS — N1831 Chronic kidney disease, stage 3a: Secondary | ICD-10-CM | POA: Diagnosis not present

## 2023-03-08 DIAGNOSIS — R64 Cachexia: Secondary | ICD-10-CM | POA: Diagnosis not present

## 2023-03-08 DIAGNOSIS — G6289 Other specified polyneuropathies: Secondary | ICD-10-CM | POA: Diagnosis not present

## 2023-03-08 DIAGNOSIS — Z7901 Long term (current) use of anticoagulants: Secondary | ICD-10-CM | POA: Diagnosis not present

## 2023-03-08 DIAGNOSIS — A4181 Sepsis due to Enterococcus: Secondary | ICD-10-CM | POA: Diagnosis not present

## 2023-03-08 DIAGNOSIS — Z23 Encounter for immunization: Secondary | ICD-10-CM | POA: Diagnosis not present

## 2023-03-08 DIAGNOSIS — I4811 Longstanding persistent atrial fibrillation: Secondary | ICD-10-CM | POA: Diagnosis not present

## 2023-03-08 DIAGNOSIS — R531 Weakness: Secondary | ICD-10-CM | POA: Diagnosis not present

## 2023-03-08 DIAGNOSIS — H04123 Dry eye syndrome of bilateral lacrimal glands: Secondary | ICD-10-CM | POA: Diagnosis not present

## 2023-03-08 LAB — CBC
HCT: 32 % — ABNORMAL LOW (ref 39.0–52.0)
Hemoglobin: 10.3 g/dL — ABNORMAL LOW (ref 13.0–17.0)
MCH: 32.5 pg (ref 26.0–34.0)
MCHC: 32.2 g/dL (ref 30.0–36.0)
MCV: 100.9 fL — ABNORMAL HIGH (ref 80.0–100.0)
Platelets: 211 10*3/uL (ref 150–400)
RBC: 3.17 MIL/uL — ABNORMAL LOW (ref 4.22–5.81)
RDW: 12.9 % (ref 11.5–15.5)
WBC: 6.2 10*3/uL (ref 4.0–10.5)
nRBC: 0 % (ref 0.0–0.2)

## 2023-03-08 LAB — CULTURE, BLOOD (ROUTINE X 2)
Culture: NO GROWTH
Culture: NO GROWTH
Special Requests: ADEQUATE
Special Requests: ADEQUATE

## 2023-03-08 LAB — PROTIME-INR
INR: 1.6 — ABNORMAL HIGH (ref 0.8–1.2)
Prothrombin Time: 19 s — ABNORMAL HIGH (ref 11.4–15.2)

## 2023-03-08 MED ORDER — AMOXICILLIN 500 MG PO TABS: 500.0000 mg | ORAL_TABLET | Freq: Two times a day (BID) | ORAL | 0 refills | Status: AC

## 2023-03-08 MED ORDER — WARFARIN SODIUM 2 MG PO TABS: 4.0000 mg | ORAL_TABLET | Freq: Once | ORAL | Status: DC

## 2023-03-08 NOTE — Progress Notes (Signed)
Patient had a large bowel movement following the ordered enema. Patient states his pain is back at the level it was before admission and is reconsidering his decision to not have surgery. Day shift present during discussion and will notify day team.

## 2023-03-08 NOTE — Progress Notes (Signed)
Physical Therapy Treatment Patient Details Name: Ralph Sullivan MRN: [id number] DOB: Mar 11, 1946 Today's Date: 03/08/2023   History of Present Illness The pt is a 77 yo male presenting 10/31 with generalized weakness in BLE. Enterococcus faecalis bacteremia getting treated with antibiotics. Pt also with SBO resolving. PMH includes: atrial fibrillation on metoprolol and warfarin, hyperhidrosis of the hands, and CKD.    PT Comments  Pt complaining of increased abdominal pain despite getting pain medication. Pt agreeable to get to EoB but is unsure he can get to recliner. Pt is contact guard for bed mobility. Once there able to perform seated LE exercise with only minor complaints of pain. Agreeable to try to stand, however pt max A for power up and is unable to maintain with bilateral knee buckling, sat back on EoB with complains of increased abdominal pain. Pt returns LE to bed without assist and is able to pull him self up in the bed. D/c plans remain appropriate at this time. PT will continue to follow acutely.     If plan is discharge home, recommend the following: A lot of help with walking and/or transfers;A lot of help with bathing/dressing/bathroom;Assistance with cooking/housework;Direct supervision/assist for medications management;Direct supervision/assist for financial management;Assist for transportation;Help with stairs or ramp for entrance   Can travel by private vehicle     No  Equipment Recommendations  None recommended by PT    Recommendations for Other Services OT consult     Precautions / Restrictions Precautions Precautions: Fall Precaution Comments: watch HR and BP Restrictions Weight Bearing Restrictions: No     Mobility  Bed Mobility Overal bed mobility: Needs Assistance Bed Mobility: Supine to Sit, Sit to Supine     Supine to sit: Contact guard, HOB elevated, Used rails Sit to supine: Contact guard assist, Used rails   General bed mobility comments: pt able  to use bed rail to pull to EoB, additional pad scoot of hips given as pt is afraid his condom cath will become dislodged, sat EoB for ~15 minutes to perform LE exercise,    Transfers Overall transfer level: Needs assistance Equipment used: Rolling walker (2 wheels) Transfers: Sit to/from Stand Sit to Stand: Max assist, From elevated surface           General transfer comment: 2 attempts and max effort to help pt to come to stand EoB in RW, pt with bilateral knee buckling and unable to take steps to chair        Balance Overall balance assessment: Needs assistance Sitting-balance support: No upper extremity supported, Feet supported Sitting balance-Leahy Scale: Fair Sitting balance - Comments: able to sit EoB with B UE to no UE support to perform seated exercises   Standing balance support: Bilateral upper extremity supported, During functional activity Standing balance-Leahy Scale: Poor Standing balance comment: BUE support and assist x1                            Cognition Arousal: Alert Behavior During Therapy: WFL for tasks assessed/performed, Lability Overall Cognitive Status: Within Functional Limits for tasks assessed                                 General Comments: tearful at end of session due to pain and inability to get up        Exercises General Exercises - Lower Extremity Ankle Circles/Pumps: AROM, Both, 10 reps  Quad Sets: AROM, Both, 10 reps, Supine Gluteal Sets: AROM, Both, 10 reps, Supine Long Arc Quad: AROM, Both, 10 reps, Seated Heel Slides: AAROM, Both, 10 reps, Supine Hip ABduction/ADduction: AROM, Both, 10 reps, Seated    General Comments General comments (skin integrity, edema, etc.): VSS with exercise      Pertinent Vitals/Pain Pain Assessment Pain Assessment: Faces Faces Pain Scale: Hurts even more Pain Location: abdomen Pain Descriptors / Indicators: Grimacing, Guarding Pain Intervention(s): Limited activity  within patient's tolerance, Monitored during session, Repositioned     PT Goals (current goals can now be found in the care plan section) Acute Rehab PT Goals Patient Stated Goal: improve endurance and return home PT Goal Formulation: With patient Time For Goal Achievement: 03/12/23 Potential to Achieve Goals: Good Progress towards PT goals: Not progressing toward goals - comment (limited by pain)    Frequency    Min 1X/week       AM-PAC PT "6 Clicks" Mobility   Outcome Measure  Help needed turning from your back to your side while in a flat bed without using bedrails?: Total Help needed moving from lying on your back to sitting on the side of a flat bed without using bedrails?: Total Help needed moving to and from a bed to a chair (including a wheelchair)?: Total Help needed standing up from a chair using your arms (e.g., wheelchair or bedside chair)?: Total Help needed to walk in hospital room?: Total Help needed climbing 3-5 steps with a railing? : Total 6 Click Score: 6    End of Session Equipment Utilized During Treatment: Gait belt Activity Tolerance: Patient limited by fatigue Patient left: with call bell/phone within reach;in bed;with bed alarm set Nurse Communication: Mobility status;Need for lift equipment PT Visit Diagnosis: Unsteadiness on feet (R26.81);Other abnormalities of gait and mobility (R26.89);Muscle weakness (generalized) (M62.81)     Time: 1030-1059 PT Time Calculation (min) (ACUTE ONLY): 29 min  Charges:    $Therapeutic Exercise: 8-22 mins $Therapeutic Activity: 8-22 mins PT General Charges $$ ACUTE PT VISIT: 1 Visit                     Laurell Coalson B. Beverely Risen PT, DPT Acute Rehabilitation Services Please use secure chat or  Call Office [phone]    [facility] 03/08/2023, 12:51 PM

## 2023-03-08 NOTE — Progress Notes (Signed)
Report called to Leeroy Bock, Charity fundraiser at [facility] by phone.  Patient awaiting transport via [facility] to facility.

## 2023-03-08 NOTE — Discharge Summary (Signed)
Physician Discharge Summary  Ralph Sullivan:[id number] DOB: [birth year] DOA: 02/25/2023  PCP: Lorenda Ishihara, MD  Admit date: 02/25/2023 Discharge date: 03/08/2023  Admitted From: Home Disposition:  SNF  Recommendations for Outpatient Follow-up:  Follow up with PCP in 1-2 weeks Please obtain BMP/CBC in one week End date of antibiotics 03/16/2023  Home Health:No Equipment/Devices:None  Discharge Condition:Stable CODE STATUS:Full Diet recommendation: Heart Healthy   Brief/Interim Summary: 77 y.o. male past medical history of A-fib on Coumadin chronic kidney disease stage IIIa, CVA comes in with generalized weakness complicated by A-fib with RVR and found to have Enterococcus faecalis bacteremia.   Discharge Diagnoses:  Principal Problem:   A-fib [facility]) Active Problems:   Weakness   Atrial fibrillation with RVR (HCC)   Bacteremia due to Enterococcus   Bacteremia  Enterococcus faecalis bacteremia: Likely source is genitourinary blood cultures grew Enterococcus sensitive to ampicillin surveillance blood culture on 03/02/2025 were negative. 2D echo showed an EF of 45% with global hypokinesia and a vegetation TEE showed no evidence of vegetation ID was consulted who recommended to continue antibiotics and outpatient transition to oral amoxicillin with an end date on 03/16/2023.  Mild left ureteral hydronephrosis: Urine culture grew Enterococcus we will continue amoxicillin as an outpatient follow-up with urology as an outpatient.  Small bowel obstruction/small lesion: CT scan of the abdomen pelvis showed partially resolving SBO oral contrast within the cecum. He was allow a diet which she tolerated. Surgery was consulted recommended to continue diet.  A-fib with RVR: Continue metoprolol and Coumadin INR is therapeutic.  Sinus bradycardia: Clonidine was held temporarily his heart rate went up he was restarted back on metoprolol. He will go home off  clonidine.  Generalized weakness: Deconditioning PT was consulted he will need to go to skilled nursing facility.  Essential hypertension/chronic systolic heart failure: Clonidine was discontinued he was continue metoprolol and he remained euvolemic.  Chronic kidney see stage IIIa: Creatinine remained at baseline.  Constipation: He was started on a bowel regimen MiraLAX and had several bowel movement.  Discharge Instructions  Discharge Instructions     Diet - low sodium heart healthy   Complete by: As directed    Increase activity slowly   Complete by: As directed       Allergies as of 03/08/2023       Reactions   Allopurinol Other (See Comments)   Colchicine Other (See Comments)   Duloxetine Other (See Comments)   More nervous   Elemental Sulfur Nausea And Vomiting   Oxycontin [oxycodone Hcl] Nausea And Vomiting        Medication List     TAKE these medications    acetaminophen 500 MG tablet Commonly known as: TYLENOL Take 1,000 mg by mouth every 6 (six) hours as needed for mild pain (pain score 1-3) (Use for pain in hands, legs, and back).   amoxicillin 500 MG tablet Commonly known as: AMOXIL Take 1 tablet (500 mg total) by mouth 2 (two) times daily for 8 days.   CINNAMON PO Take 1,000 mg by mouth in the morning and at bedtime.   cloNIDine 0.1 MG tablet Commonly known as: CATAPRES Take 1 tablet (0.1 mg total) by mouth 3 (three) times daily. Take between 0.2mg  dose. What changed: Another medication with the same name was removed. Continue taking this medication, and follow the directions you see here.   cyanocobalamin 500 MCG tablet Commonly known as: VITAMIN B12 Take 500 mcg by mouth at bedtime.   fluticasone 50 MCG/ACT nasal  spray Commonly known as: FLONASE Place 2 sprays into both nostrils daily.   lactose free nutrition Liqd Take 237 mLs by mouth 2 (two) times daily between meals.   loratadine 10 MG tablet Commonly known as: CLARITIN Take  10 mg by mouth daily.   metoprolol tartrate 25 MG tablet Commonly known as: LOPRESSOR Take 25 mg by mouth daily as needed (Take when heart rate is over 100bpm).   Omega 3 1000 MG Caps Take 1 capsule by mouth 2 (two) times daily.   polyvinyl alcohol 1.4 % ophthalmic solution Commonly known as: LIQUIFILM TEARS Place 1 drop into both eyes 2 (two) times daily as needed for dry eyes.   vitamin E 180 MG (400 UNITS) capsule Take 400 Units by mouth daily.   warfarin 5 MG tablet Commonly known as: COUMADIN Take as directed. If you are unsure how to take this medication, talk to your nurse or doctor. Original instructions: TAKE 1 TABLET BY MOUTH ONCE DAILY OR AS DIRECTED BY THE COUMADIN CLINIC What changed: See the new instructions.        Allergies  Allergen Reactions   Allopurinol Other (See Comments)   Colchicine Other (See Comments)   Duloxetine Other (See Comments)    More nervous   Elemental Sulfur Nausea And Vomiting   Oxycontin [Oxycodone Hcl] Nausea And Vomiting    Consultations: General Surgery   Procedures/Studies: ECHO TEE  Result Date: 03/05/2023    TRANSESOPHOGEAL ECHO REPORT   [name] Date of Exam: 03/05/2023 Medical Rec #:  [id number]     Height:       72.0 in Accession #:    [id number]    Weight:       155.2 lb Date of Birth:  1945/12/04      BSA:          1.913 m Patient Age:    77 years      BP:           150/106 mmHg Patient Gender: M             HR:           102 bpm. Exam Location:  Inpatient Procedure: Transesophageal Echo, Cardiac Doppler and Color Doppler Indications:     Bacteremia  History:         Patient has prior history of Echocardiogram examinations, most                  recent 03/03/2023. CKD, Arrythmias:Atrial Fibrillation;                  Signs/Symptoms:Bacteremia.  Sonographer:     [name] Sonographer#2:   [name] Referring Phys:  [id number] Odette Fraction Diagnosing Phys: Donato Schultz MD PROCEDURE: After  discussion of the risks and benefits of a TEE, an informed consent was obtained from the patient. The patient was intubated. The transesophogeal probe was passed without difficulty through the esophogus of the patient. Imaged were obtained with the patient in a supine position. Sedation performed by different physician. The patient was monitored while under deep sedation. Anesthestetic sedation was provided intravenously by Anesthesiology: 110mg  of Propofol, 60mg  of Lidocaine. Image quality was good. The patient's vital signs; including heart rate, blood pressure, and oxygen saturation; remained stable throughout the procedure. The patient developed no complications during the procedure.  IMPRESSIONS  1. Left ventricular ejection fraction, by estimation, is 35 to 40%. The left ventricle has moderately decreased function.  The left ventricle demonstrates global hypokinesis.  2. Right ventricular systolic function is normal. The right ventricular size is normal.  3. Left atrial size was severely dilated. No left atrial/left atrial appendage thrombus was detected.  4. Right atrial size was severely dilated.  5. The mitral valve is normal in structure. Mild mitral valve regurgitation. No evidence of mitral stenosis.  6. The aortic valve is tricuspid. Aortic valve regurgitation is trivial. No aortic stenosis is present.  7. The inferior vena cava is normal in size with greater than 50% respiratory variability, suggesting right atrial pressure of 3 mmHg. Conclusion(s)/Recommendation(s): No evidence of vegetation/infective endocarditis on this transesophageael echocardiogram. FINDINGS  Left Ventricle: Left ventricular ejection fraction, by estimation, is 35 to 40%. The left ventricle has moderately decreased function. The left ventricle demonstrates global hypokinesis. The left ventricular internal cavity size was normal in size. There is no left ventricular hypertrophy. Right Ventricle: The right ventricular size is  normal. No increase in right ventricular wall thickness. Right ventricular systolic function is normal. Left Atrium: Left atrial size was severely dilated. No left atrial/left atrial appendage thrombus was detected. Right Atrium: Right atrial size was severely dilated. Pericardium: There is no evidence of pericardial effusion. Mitral Valve: The mitral valve is normal in structure. Mild mitral valve regurgitation. No evidence of mitral valve stenosis. Tricuspid Valve: The tricuspid valve is normal in structure. Tricuspid valve regurgitation is not demonstrated. No evidence of tricuspid stenosis. Aortic Valve: The aortic valve is tricuspid. Aortic valve regurgitation is trivial. No aortic stenosis is present. Pulmonic Valve: The pulmonic valve was normal in structure. Pulmonic valve regurgitation is not visualized. No evidence of pulmonic stenosis. Aorta: The aortic root is normal in size and structure. Venous: The inferior vena cava is normal in size with greater than 50% respiratory variability, suggesting right atrial pressure of 3 mmHg. IAS/Shunts: No atrial level shunt detected by color flow Doppler. Additional Comments: Spectral Doppler performed. Donato Schultz MD Electronically signed by Donato Schultz MD Signature Date/Time: 03/05/2023/12:26:33 PM    Final    EP STUDY  Result Date: 03/05/2023 See surgical note for result.  ECHOCARDIOGRAM COMPLETE  Result Date: 03/03/2023    ECHOCARDIOGRAM REPORT   [name] Date of Exam: 03/03/2023 Medical Rec #:  [id number]     Height:       72.0 in Accession #:    [id number]    Weight:       155.2 lb Date of Birth:  1945-05-24      BSA:          1.913 m Patient Age:    77 years      BP:           131/83 mmHg Patient Gender: M             HR:           93 bpm. Exam Location:  Inpatient Procedure: 2D Echo, Color Doppler, Cardiac Doppler and Intracardiac            Opacification Agent Indications:    Bacteremia  History:        Patient has prior history of  Echocardiogram examinations, most                 recent 04/30/2022. Arrythmias:Atrial Fibrillation; Risk                 Factors:Hypertension.  Sonographer:    Irving Burton Senior RDCS Referring Phys: Zigmund Daniel  Sonographer Comments: Technically difficult due to significant lung interference URI IMPRESSIONS  1. Left ventricular ejection fraction, by estimation, is 45 to 50%. The left ventricle has mildly decreased function. The left ventricle demonstrates global hypokinesis. Left ventricular diastolic parameters are indeterminate.  2. Right ventricular systolic function is mildly reduced. The right ventricular size is normal. Tricuspid regurgitation signal is inadequate for assessing PA pressure.  3. Left atrial size was severely dilated.  4. Right atrial size was mildly dilated.  5. The mitral valve is normal in structure. Trivial mitral valve regurgitation.  6. The aortic valve was not well visualized. Aortic valve regurgitation is trivial. No aortic stenosis is present. Conclusion(s)/Recommendation(s): No vegetation seen but technically difficult study. Consider a transesophageal echocardiogram to exclude infective endocarditis if clinically indicated. FINDINGS  Left Ventricle: Left ventricular ejection fraction, by estimation, is 45 to 50%. The left ventricle has mildly decreased function. The left ventricle demonstrates global hypokinesis. Definity contrast agent was given IV to delineate the left ventricular  endocardial borders. The left ventricular internal cavity size was normal in size. There is no left ventricular hypertrophy. Left ventricular diastolic parameters are indeterminate. Right Ventricle: The right ventricular size is normal. Right vetricular wall thickness was not well visualized. Right ventricular systolic function is mildly reduced. Tricuspid regurgitation signal is inadequate for assessing PA pressure. Left Atrium: Left atrial size was severely dilated. Right Atrium: Right atrial size  was mildly dilated. Pericardium: There is no evidence of pericardial effusion. Mitral Valve: The mitral valve is normal in structure. Trivial mitral valve regurgitation. Tricuspid Valve: The tricuspid valve is normal in structure. Tricuspid valve regurgitation is trivial. Aortic Valve: The aortic valve was not well visualized. Aortic valve regurgitation is trivial. No aortic stenosis is present. Pulmonic Valve: The pulmonic valve was not well visualized. Pulmonic valve regurgitation is trivial. Aorta: The aortic root is normal in size and structure. IAS/Shunts: The interatrial septum was not well visualized.  LEFT VENTRICLE PLAX 2D LVIDd:         5.40 cm LVIDs:         4.30 cm LV PW:         0.90 cm LV IVS:        0.90 cm LVOT diam:     2.50 cm LV SV:         48 LV SV Index:   25 LVOT Area:     4.91 cm  RIGHT VENTRICLE RV S prime:     10.40 cm/s TAPSE (M-mode): 1.8 cm LEFT ATRIUM              Index         RIGHT ATRIUM           Index LA diam:        5.50 cm  2.88 cm/m    RA Area:     24.60 cm LA Vol (A2C):   123.0 ml 64.30 ml/m   RA Volume:   73.80 ml  38.58 ml/m LA Vol (A4C):   203.0 ml 106.12 ml/m LA Biplane Vol: 167.0 ml 87.30 ml/m  AORTIC VALVE LVOT Vmax:   56.80 cm/s LVOT Vmean:  44.150 cm/s LVOT VTI:    0.097 m  AORTA Ao Root diam: 3.80 cm  SHUNTS Systemic VTI:  0.10 m Systemic Diam: 2.50 cm Epifanio Lesches MD Electronically signed by Epifanio Lesches MD Signature Date/Time: 03/03/2023/4:31:12 PM    Final    DG CHEST PORT 1 VIEW  Result Date: 03/01/2023 CLINICAL DATA:  Fever  EXAM: PORTABLE CHEST 1 VIEW COMPARISON:  02/25/2023 FINDINGS: The heart size and mediastinal contours are within normal limits. Both lungs are clear. The visualized skeletal structures are unremarkable. IMPRESSION: No active disease. Electronically Signed   By: Helyn Numbers M.D.   On: 03/01/2023 21:55   CT ABDOMEN PELVIS WO CONTRAST  Result Date: 03/01/2023 CLINICAL DATA:  Inpatient. Follow-up small bowel  obstruction. Acute abdominal pain. EXAM: CT ABDOMEN AND PELVIS WITHOUT CONTRAST TECHNIQUE: Multidetector CT imaging of the abdomen and pelvis was performed following the standard protocol without IV contrast. RADIATION DOSE REDUCTION: This exam was performed according to the departmental dose-optimization program which includes automated exposure control, adjustment of the mA and/or kV according to patient size and/or use of iterative reconstruction technique. COMPARISON:  02/27/2023 CT abdomen/pelvis. FINDINGS: Portions of the upper abdomen including superior liver, spleen and stomach are inadvertently excluded from the study. Lower chest: No acute abnormality. Hepatobiliary: Normal liver size. No liver mass. Cholelithiasis. No gallbladder distention, gallbladder wall thickening or pericholecystic fluid. No biliary ductal dilatation. Pancreas: Normal, with no mass or duct dilation. Spleen: Normal size. No mass. Adrenals/Urinary Tract: Normal adrenals. Nonobstructing 7 mm and 2 mm mid to upper right renal stones. No right hydronephrosis. New mild left hydroureteronephrosis to the level of the upper left pelvic ureter. A few tiny nonobstructing left renal stones, largest 2 mm in the upper left kidney. Numerous small simple bilateral renal cysts and subcentimeter hypodense bilateral renal cortical lesions that are too small to characterize, largest 3.0 cm in the lower left kidney, for which no follow-up imaging is recommended. Normal caliber right ureter. No ureteral stones. Chronic mild diffuse bladder wall thickening. No bladder stones. Stomach/Bowel: Normal non-distended stomach. Oral contrast transits to the cecum. Normal appendix with oral contrast within the lumen of the appendix. A few persistent mildly dilated mid to distal small bowel loops up to 3.4 cm diameter, improved. Previously described transition point in the right abdomen is no longer discretely visualized. No definite small bowel wall thickening. No  pneumatosis. Mild sigmoid diverticulosis with no large bowel wall thickening or significant pericolonic fat stranding. Vascular/Lymphatic: Atherosclerotic nonaneurysmal abdominal aorta. No pathologically enlarged lymph nodes in the abdomen or pelvis. Reproductive: Normal size prostate. No pneumoperitoneum. Trace deep pelvic ascites is similar. No focal fluid collection. Other: No pneumoperitoneum, ascites or focal fluid collection. Musculoskeletal: No aggressive appearing focal osseous lesions. Marked multilevel lumbar degenerative disc disease. Partially visualized fixation hardware in the proximal left femur. IMPRESSION: 1. Resolving partial distal small bowel obstruction. Oral contrast transits to the cecum. Previously described transition point in the right abdomen is no longer discretely visualized. Mildly dilated small bowel loops are improved. 2. Nonobstructing bilateral nephrolithiasis. New mild left hydroureteronephrosis to the level of the upper left pelvic ureter, with no ureteral or bladder stones. Chronic mild diffuse bladder wall thickening. 3. Cholelithiasis. 4. Mild sigmoid diverticulosis. 5.  Aortic Atherosclerosis (ICD10-I70.0). Electronically Signed   By: Delbert Phenix M.D.   On: 03/01/2023 18:18   CT ABDOMEN PELVIS WO CONTRAST  Result Date: 02/27/2023 CLINICAL DATA:  Abdominal pain, bowel obstruction suspected EXAM: CT ABDOMEN AND PELVIS WITHOUT CONTRAST TECHNIQUE: Multidetector CT imaging of the abdomen and pelvis was performed following the standard protocol without IV contrast. RADIATION DOSE REDUCTION: This exam was performed according to the departmental dose-optimization program which includes automated exposure control, adjustment of the mA and/or kV according to patient size and/or use of iterative reconstruction technique. COMPARISON:  Abdominal radiograph dated 12/28/2022. CT abdomen/pelvis dated 07/16/2016. FINDINGS:  Lower chest: Mild scarring in the left lower lobe. Hepatobiliary:  Unenhanced liver is unremarkable, noting a benign calcified granuloma. Layering tiny gallstones (series 3/image 29), without associated inflammatory changes. No intrahepatic or extrahepatic ductal dilatation. Pancreas: Within normal limits. Spleen: Within normal limits. Adrenals/Urinary Tract: Adrenal glands are within normal limits. Numerous bilateral renal cysts, measuring up to 3.4 cm in the left lower kidney (series 3/image 41), benign (Bosniak I). No follow-up is recommended. Bilateral renal cortical atrophy. Multiple nonobstructing bilateral renal calculi measuring up to 8 mm in the right upper kidney (series 3/image 33). No ureteral or bladder calculi. No hydronephrosis. Bladder is within normal limits. Stomach/Bowel: Stomach is within normal limits. Dilated small bowel throughout the abdomen. Transition point in distal ileum where there is short segment narrowing (series 3/image 35; coronal image 61). An underlying small-bowel lesion is possible. Normal appendix (series 3/image 3). Left colonic diverticulosis, without evidence of diverticulitis. Vascular/Lymphatic: No evidence of abdominal aortic aneurysm. Atherosclerotic calcifications of the abdominal aorta and branch vessels. No suspicious abdominopelvic lymphadenopathy. Reproductive: Dystrophic calcifications in the prostate. Other: No abdominopelvic ascites. Tiny fat containing periumbilical hernia (series 3/image 46). Musculoskeletal: Degenerative changes of the visualized thoracolumbar spine. Status post ORIF of the left hip. IMPRESSION: Small bowel obstruction with transition point in the distal ileum. An underlying small bowel lesion is possible. Additional ancillary findings as above. Electronically Signed   By: Charline Bills M.D.   On: 02/27/2023 21:19   DG Abd 1 View  Result Date: 02/27/2023 CLINICAL DATA:  Abdominal pain. EXAM: ABDOMEN - 1 VIEW COMPARISON:  Abdominopelvic CT 07/16/2016. FINDINGS: 1600 hours. Single portable supine view  of the abdomen demonstrates mildly dilated small bowel loops in the central abdomen, asymmetric to the right. The stomach appears mildly distended. No significant colonic distention identified. There is no supine evidence of pneumoperitoneum. Previous CT demonstrated a left inguinal hernia which is not visualized on this examination. Possible right renal and bladder calculi. Severe lumbar spondylosis associated with a convex left scoliosis. IMPRESSION: Mildly dilated small bowel loops in the central abdomen, asymmetric to the right, suboptimally evaluated on this single supine examination. Findings could be secondary to an ileus versus early or partial small bowel obstruction. Correlate clinically. Consider further evaluation with follow-up abdominopelvic CT. Electronically Signed   By: Carey Bullocks M.D.   On: 02/27/2023 16:26   DG Chest 2 View  Result Date: 02/25/2023 CLINICAL DATA:  Chills. EXAM: CHEST - 2 VIEW COMPARISON:  July 23, 2022 FINDINGS: The cardiac silhouette is within normal limits. There is marked severity tortuosity of the descending thoracic aorta. Both lungs are clear. Multilevel degenerative changes are noted throughout the thoracic spine. IMPRESSION: No active cardiopulmonary disease. Electronically Signed   By: Aram Candela M.D.   On: 02/25/2023 18:12   (Echo, Carotid, EGD, Colonoscopy, ERCP)    Subjective: No complaints  Discharge Exam: Vitals:   03/08/23 0445 03/08/23 1143  BP: 96/68 94/81  Pulse: (!) 57 64  Resp: 16   Temp: (!) 97.5 F (36.4 C) 97.7 F (36.5 C)  SpO2: 100%    Vitals:   03/07/23 2029 03/07/23 2158 03/08/23 0445 03/08/23 1143  BP: 92/69 101/76 96/68 94/81   Pulse: 72 66 (!) 57 64  Resp: 16  16   Temp: 98 F (36.7 C)  (!) 97.5 F (36.4 C) 97.7 F (36.5 C)  TempSrc: Oral  Oral Oral  SpO2: 98%  100%   Weight:      Height:  General: Pt is alert, awake, not in acute distress Cardiovascular: RRR, S1/S2 +, no rubs, no  gallops Respiratory: CTA bilaterally, no wheezing, no rhonchi Abdominal: Soft, NT, ND, bowel sounds + Extremities: no edema, no cyanosis    The results of significant diagnostics from this hospitalization (including imaging, microbiology, ancillary and laboratory) are listed below for reference.     Microbiology: Recent Results (from the past 240 hour(s))  Culture, blood (Routine X 2) w Reflex to ID Panel     Status: Abnormal   Collection Time: 03/01/23  5:58 PM   Specimen: BLOOD  Result Value Ref Range Status   Specimen Description BLOOD LEFT ANTECUBITAL  Final   Special Requests   Final    BOTTLES DRAWN AEROBIC AND ANAEROBIC Blood Culture adequate volume   Culture  Setup Time   Final    GRAM POSITIVE COCCI IN PAIRS AND CHAINS IN BOTH AEROBIC AND ANAEROBIC BOTTLES CRITICAL RESULT CALLED TO, READ BACK BY AND VERIFIED WITH: PHARMD J.FRENS AT 1125 ON 03/02/2023 BY T.SAAD.    Culture (A)  Final    ENTEROCOCCUS FAECALIS SUSCEPTIBILITIES PERFORMED ON PREVIOUS CULTURE WITHIN THE LAST 5 DAYS. STAPHYLOCOCCUS EPIDERMIDIS THE SIGNIFICANCE OF ISOLATING THIS ORGANISM FROM A SINGLE SET OF BLOOD CULTURES WHEN MULTIPLE SETS ARE DRAWN IS UNCERTAIN. PLEASE NOTIFY THE MICROBIOLOGY DEPARTMENT WITHIN ONE WEEK IF SPECIATION AND SENSITIVITIES ARE REQUIRED. Performed at [facility], [address] [place], [place] [address]    Report Status 03/04/2023 FINAL  Final  Blood Culture ID Panel (Reflexed)     Status: Abnormal   Collection Time: 03/01/23  5:58 PM  Result Value Ref Range Status   Enterococcus faecalis DETECTED (A) NOT DETECTED Final    Comment: CRITICAL RESULT CALLED TO, READ BACK BY AND VERIFIED WITH: PHARMD J.FRENS AT 1125 ON 03/02/2023 BY T.SAAD.    Enterococcus Faecium NOT DETECTED NOT DETECTED Final   Listeria monocytogenes NOT DETECTED NOT DETECTED Final   Staphylococcus species DETECTED (A) NOT DETECTED Final    Comment: CRITICAL RESULT CALLED TO, READ BACK BY AND VERIFIED  WITH: PHARMD J.FRENS AT 1125 ON 03/02/2023 BY T.SAAD.    Staphylococcus aureus (BCID) NOT DETECTED NOT DETECTED Final   Staphylococcus epidermidis DETECTED (A) NOT DETECTED Final    Comment: CRITICAL RESULT CALLED TO, READ BACK BY AND VERIFIED WITH: PHARMD J.FRENS AT 1125 ON 03/02/2023 BY T.SAAD.    Staphylococcus lugdunensis NOT DETECTED NOT DETECTED Final   Streptococcus species NOT DETECTED NOT DETECTED Final   Streptococcus agalactiae NOT DETECTED NOT DETECTED Final   Streptococcus pneumoniae NOT DETECTED NOT DETECTED Final   Streptococcus pyogenes NOT DETECTED NOT DETECTED Final   A.calcoaceticus-baumannii NOT DETECTED NOT DETECTED Final   Bacteroides fragilis NOT DETECTED NOT DETECTED Final   Enterobacterales NOT DETECTED NOT DETECTED Final   Enterobacter cloacae complex NOT DETECTED NOT DETECTED Final   Escherichia coli NOT DETECTED NOT DETECTED Final   Klebsiella aerogenes NOT DETECTED NOT DETECTED Final   Klebsiella oxytoca NOT DETECTED NOT DETECTED Final   Klebsiella pneumoniae NOT DETECTED NOT DETECTED Final   Proteus species NOT DETECTED NOT DETECTED Final   Salmonella species NOT DETECTED NOT DETECTED Final   Serratia marcescens NOT DETECTED NOT DETECTED Final   Haemophilus influenzae NOT DETECTED NOT DETECTED Final   Neisseria meningitidis NOT DETECTED NOT DETECTED Final   Pseudomonas aeruginosa NOT DETECTED NOT DETECTED Final   Stenotrophomonas maltophilia NOT DETECTED NOT DETECTED Final   Candida albicans NOT DETECTED NOT DETECTED Final   Candida auris  NOT DETECTED NOT DETECTED Final   Candida glabrata NOT DETECTED NOT DETECTED Final   Candida krusei NOT DETECTED NOT DETECTED Final   Candida parapsilosis NOT DETECTED NOT DETECTED Final   Candida tropicalis NOT DETECTED NOT DETECTED Final   Cryptococcus neoformans/gattii NOT DETECTED NOT DETECTED Final   Methicillin resistance mecA/C NOT DETECTED NOT DETECTED Final   Vancomycin resistance NOT DETECTED NOT DETECTED  Final    Comment: Performed at [facility], [address], [place], [place] [address]  Culture, blood (Routine X 2) w Reflex to ID Panel     Status: Abnormal   Collection Time: 03/01/23  6:07 PM   Specimen: BLOOD LEFT ARM  Result Value Ref Range Status   Specimen Description BLOOD LEFT ARM  Final   Special Requests   Final    BOTTLES DRAWN AEROBIC AND ANAEROBIC Blood Culture adequate volume   Culture  Setup Time   Final    GRAM POSITIVE COCCI IN CHAINS IN BOTH AEROBIC AND ANAEROBIC BOTTLES CRITICAL VALUE NOTED.  VALUE IS CONSISTENT WITH PREVIOUSLY REPORTED AND CALLED VALUE. Performed at [facility], [address], [place], [place] [address]    Culture ENTEROCOCCUS FAECALIS (A)  Final   Report Status 03/04/2023 FINAL  Final   Organism ID, Bacteria ENTEROCOCCUS FAECALIS  Final      Susceptibility   Enterococcus faecalis - MIC*    AMPICILLIN <=2 SENSITIVE Sensitive     VANCOMYCIN 1 SENSITIVE Sensitive     GENTAMICIN SYNERGY SENSITIVE Sensitive     * ENTEROCOCCUS FAECALIS  Urine Culture (for pregnant, neutropenic or urologic patients or patients with an indwelling urinary catheter)     Status: Abnormal   Collection Time: 03/01/23 11:30 PM   Specimen: Urine, Clean Catch  Result Value Ref Range Status   Specimen Description URINE, CLEAN CATCH  Final   Special Requests   Final    NONE Performed at [facility], [address], [place], [place] [address]    Culture (A)  Final    >=100,000 COLONIES/mL ENTEROCOCCUS FAECALIS 40,000 COLONIES/mL KLEBSIELLA OXYTOCA    Report Status 03/04/2023 FINAL  Final   Organism ID, Bacteria ENTEROCOCCUS FAECALIS (A)  Final   Organism ID, Bacteria KLEBSIELLA OXYTOCA (A)  Final      Susceptibility   Enterococcus faecalis - MIC*    AMPICILLIN <=2 SENSITIVE Sensitive     NITROFURANTOIN <=16 SENSITIVE Sensitive     VANCOMYCIN 1 SENSITIVE Sensitive     * >=100,000 COLONIES/mL ENTEROCOCCUS FAECALIS   Klebsiella oxytoca - MIC*     AMPICILLIN >=32 RESISTANT Resistant     CEFEPIME <=0.12 SENSITIVE Sensitive     CEFTRIAXONE <=0.25 SENSITIVE Sensitive     CIPROFLOXACIN <=0.25 SENSITIVE Sensitive     GENTAMICIN <=1 SENSITIVE Sensitive     IMIPENEM <=0.25 SENSITIVE Sensitive     NITROFURANTOIN 32 SENSITIVE Sensitive     TRIMETH/SULFA <=20 SENSITIVE Sensitive     AMPICILLIN/SULBACTAM 16 INTERMEDIATE Intermediate     PIP/TAZO <=4 SENSITIVE Sensitive ug/mL    * 40,000 COLONIES/mL KLEBSIELLA OXYTOCA  Culture, blood (Routine X 2) w Reflex to ID Panel     Status: None   Collection Time: 03/03/23  5:04 AM   Specimen: BLOOD  Result Value Ref Range Status   Specimen Description BLOOD BLOOD RIGHT ARM  Final   Special Requests   Final    BOTTLES DRAWN AEROBIC AND ANAEROBIC Blood Culture adequate volume   Culture   Final  NO GROWTH 5 DAYS Performed at [facility], [address] [place], [place] [address]    Report Status 03/08/2023 FINAL  Final  Culture, blood (Routine X 2) w Reflex to ID Panel     Status: None   Collection Time: 03/03/23  5:06 AM   Specimen: BLOOD  Result Value Ref Range Status   Specimen Description BLOOD BLOOD RIGHT ARM  Final   Special Requests   Final    BOTTLES DRAWN AEROBIC AND ANAEROBIC Blood Culture adequate volume   Culture   Final    NO GROWTH 5 DAYS Performed at [facility], [address], [place], [place] [address]    Report Status 03/08/2023 FINAL  Final     Labs: BNP (last 3 results) Recent Labs    02/25/23 1250  BNP 194.1*   Basic Metabolic Panel: Recent Labs  Lab 03/03/23 0504  NA 129*  K 4.1  CL 91*  CO2 26  GLUCOSE 101*  BUN 16  CREATININE 1.39*  CALCIUM 9.4  MG 1.7  PHOS 3.1   Liver Function Tests: Recent Labs  Lab 03/03/23 0504  AST 21  ALT 12  ALKPHOS 53  BILITOT 0.8  PROT 6.7  ALBUMIN 3.2*   No results for input(s): "LIPASE", "AMYLASE" in the last 168 hours. No results for input(s): "AMMONIA" in the last 168 hours. CBC: Recent  Labs  Lab 03/03/23 0504 03/06/23 0330 03/08/23 0510  WBC 6.7 6.7 6.2  NEUTROABS 5.1  --   --   HGB 12.6* 12.1* 10.3*  HCT 38.3* 36.0* 32.0*  MCV 99.2 96.5 100.9*  PLT 168 199 211   Cardiac Enzymes: No results for input(s): "CKTOTAL", "CKMB", "CKMBINDEX", "TROPONINI" in the last 168 hours. BNP: Invalid input(s): "POCBNP" CBG: No results for input(s): "GLUCAP" in the last 168 hours. D-Dimer No results for input(s): "DDIMER" in the last 72 hours. Hgb A1c No results for input(s): "HGBA1C" in the last 72 hours. Lipid Profile No results for input(s): "CHOL", "HDL", "LDLCALC", "TRIG", "CHOLHDL", "LDLDIRECT" in the last 72 hours. Thyroid function studies No results for input(s): "TSH", "T4TOTAL", "T3FREE", "THYROIDAB" in the last 72 hours.  Invalid input(s): "FREET3" Anemia work up No results for input(s): "VITAMINB12", "FOLATE", "FERRITIN", "TIBC", "IRON", "RETICCTPCT" in the last 72 hours. Urinalysis    Component Value Date/Time   COLORURINE STRAW (A) 03/01/2023 1743   APPEARANCEUR CLEAR 03/01/2023 1743   LABSPEC 1.010 03/01/2023 1743   PHURINE 5.0 03/01/2023 1743   GLUCOSEU NEGATIVE 03/01/2023 1743   HGBUR MODERATE (A) 03/01/2023 1743   BILIRUBINUR NEGATIVE 03/01/2023 1743   KETONESUR 5 (A) 03/01/2023 1743   PROTEINUR 30 (A) 03/01/2023 1743   NITRITE NEGATIVE 03/01/2023 1743   LEUKOCYTESUR TRACE (A) 03/01/2023 1743   Sepsis Labs Recent Labs  Lab 03/03/23 0504 03/06/23 0330 03/08/23 0510  WBC 6.7 6.7 6.2   Microbiology Recent Results (from the past 240 hour(s))  Culture, blood (Routine X 2) w Reflex to ID Panel     Status: Abnormal   Collection Time: 03/01/23  5:58 PM   Specimen: BLOOD  Result Value Ref Range Status   Specimen Description BLOOD LEFT ANTECUBITAL  Final   Special Requests   Final    BOTTLES DRAWN AEROBIC AND ANAEROBIC Blood Culture adequate volume   Culture  Setup Time   Final    GRAM POSITIVE COCCI IN PAIRS AND CHAINS IN BOTH AEROBIC AND  ANAEROBIC BOTTLES CRITICAL RESULT CALLED TO, READ BACK BY AND VERIFIED WITH: PHARMD  J.FRENS AT 1125 ON 03/02/2023 BY T.SAAD.    Culture (A)  Final    ENTEROCOCCUS FAECALIS SUSCEPTIBILITIES PERFORMED ON PREVIOUS CULTURE WITHIN THE LAST 5 DAYS. STAPHYLOCOCCUS EPIDERMIDIS THE SIGNIFICANCE OF ISOLATING THIS ORGANISM FROM A SINGLE SET OF BLOOD CULTURES WHEN MULTIPLE SETS ARE DRAWN IS UNCERTAIN. PLEASE NOTIFY THE MICROBIOLOGY DEPARTMENT WITHIN ONE WEEK IF SPECIATION AND SENSITIVITIES ARE REQUIRED. Performed at [facility], [address] [place], [place] [address]    Report Status 03/04/2023 FINAL  Final  Blood Culture ID Panel (Reflexed)     Status: Abnormal   Collection Time: 03/01/23  5:58 PM  Result Value Ref Range Status   Enterococcus faecalis DETECTED (A) NOT DETECTED Final    Comment: CRITICAL RESULT CALLED TO, READ BACK BY AND VERIFIED WITH: PHARMD J.FRENS AT 1125 ON 03/02/2023 BY T.SAAD.    Enterococcus Faecium NOT DETECTED NOT DETECTED Final   Listeria monocytogenes NOT DETECTED NOT DETECTED Final   Staphylococcus species DETECTED (A) NOT DETECTED Final    Comment: CRITICAL RESULT CALLED TO, READ BACK BY AND VERIFIED WITH: PHARMD J.FRENS AT 1125 ON 03/02/2023 BY T.SAAD.    Staphylococcus aureus (BCID) NOT DETECTED NOT DETECTED Final   Staphylococcus epidermidis DETECTED (A) NOT DETECTED Final    Comment: CRITICAL RESULT CALLED TO, READ BACK BY AND VERIFIED WITH: PHARMD J.FRENS AT 1125 ON 03/02/2023 BY T.SAAD.    Staphylococcus lugdunensis NOT DETECTED NOT DETECTED Final   Streptococcus species NOT DETECTED NOT DETECTED Final   Streptococcus agalactiae NOT DETECTED NOT DETECTED Final   Streptococcus pneumoniae NOT DETECTED NOT DETECTED Final   Streptococcus pyogenes NOT DETECTED NOT DETECTED Final   A.calcoaceticus-baumannii NOT DETECTED NOT DETECTED Final   Bacteroides fragilis NOT DETECTED NOT DETECTED Final   Enterobacterales NOT DETECTED NOT DETECTED Final    Enterobacter cloacae complex NOT DETECTED NOT DETECTED Final   Escherichia coli NOT DETECTED NOT DETECTED Final   Klebsiella aerogenes NOT DETECTED NOT DETECTED Final   Klebsiella oxytoca NOT DETECTED NOT DETECTED Final   Klebsiella pneumoniae NOT DETECTED NOT DETECTED Final   Proteus species NOT DETECTED NOT DETECTED Final   Salmonella species NOT DETECTED NOT DETECTED Final   Serratia marcescens NOT DETECTED NOT DETECTED Final   Haemophilus influenzae NOT DETECTED NOT DETECTED Final   Neisseria meningitidis NOT DETECTED NOT DETECTED Final   Pseudomonas aeruginosa NOT DETECTED NOT DETECTED Final   Stenotrophomonas maltophilia NOT DETECTED NOT DETECTED Final   Candida albicans NOT DETECTED NOT DETECTED Final   Candida auris NOT DETECTED NOT DETECTED Final   Candida glabrata NOT DETECTED NOT DETECTED Final   Candida krusei NOT DETECTED NOT DETECTED Final   Candida parapsilosis NOT DETECTED NOT DETECTED Final   Candida tropicalis NOT DETECTED NOT DETECTED Final   Cryptococcus neoformans/gattii NOT DETECTED NOT DETECTED Final   Methicillin resistance mecA/C NOT DETECTED NOT DETECTED Final   Vancomycin resistance NOT DETECTED NOT DETECTED Final    Comment: Performed at [facility], [address], [place], [place] [address]  Culture, blood (Routine X 2) w Reflex to ID Panel     Status: Abnormal   Collection Time: 03/01/23  6:07 PM   Specimen: BLOOD LEFT ARM  Result Value Ref Range Status   Specimen Description BLOOD LEFT ARM  Final   Special Requests   Final    BOTTLES DRAWN AEROBIC AND ANAEROBIC Blood Culture adequate volume   Culture  Setup Time   Final    GRAM POSITIVE COCCI IN CHAINS IN BOTH  AEROBIC AND ANAEROBIC BOTTLES CRITICAL VALUE NOTED.  VALUE IS CONSISTENT WITH PREVIOUSLY REPORTED AND CALLED VALUE. Performed at [facility], [address] [place], [place] [address]    Culture ENTEROCOCCUS FAECALIS (A)  Final   Report Status 03/04/2023 FINAL  Final   Organism  ID, Bacteria ENTEROCOCCUS FAECALIS  Final      Susceptibility   Enterococcus faecalis - MIC*    AMPICILLIN <=2 SENSITIVE Sensitive     VANCOMYCIN 1 SENSITIVE Sensitive     GENTAMICIN SYNERGY SENSITIVE Sensitive     * ENTEROCOCCUS FAECALIS  Urine Culture (for pregnant, neutropenic or urologic patients or patients with an indwelling urinary catheter)     Status: Abnormal   Collection Time: 03/01/23 11:30 PM   Specimen: Urine, Clean Catch  Result Value Ref Range Status   Specimen Description URINE, CLEAN CATCH  Final   Special Requests   Final    NONE Performed at [facility], [address] [place], [place] [address]    Culture (A)  Final    >=100,000 COLONIES/mL ENTEROCOCCUS FAECALIS 40,000 COLONIES/mL KLEBSIELLA OXYTOCA    Report Status 03/04/2023 FINAL  Final   Organism ID, Bacteria ENTEROCOCCUS FAECALIS (A)  Final   Organism ID, Bacteria KLEBSIELLA OXYTOCA (A)  Final      Susceptibility   Enterococcus faecalis - MIC*    AMPICILLIN <=2 SENSITIVE Sensitive     NITROFURANTOIN <=16 SENSITIVE Sensitive     VANCOMYCIN 1 SENSITIVE Sensitive     * >=100,000 COLONIES/mL ENTEROCOCCUS FAECALIS   Klebsiella oxytoca - MIC*    AMPICILLIN >=32 RESISTANT Resistant     CEFEPIME <=0.12 SENSITIVE Sensitive     CEFTRIAXONE <=0.25 SENSITIVE Sensitive     CIPROFLOXACIN <=0.25 SENSITIVE Sensitive     GENTAMICIN <=1 SENSITIVE Sensitive     IMIPENEM <=0.25 SENSITIVE Sensitive     NITROFURANTOIN 32 SENSITIVE Sensitive     TRIMETH/SULFA <=20 SENSITIVE Sensitive     AMPICILLIN/SULBACTAM 16 INTERMEDIATE Intermediate     PIP/TAZO <=4 SENSITIVE Sensitive ug/mL    * 40,000 COLONIES/mL KLEBSIELLA OXYTOCA  Culture, blood (Routine X 2) w Reflex to ID Panel     Status: None   Collection Time: 03/03/23  5:04 AM   Specimen: BLOOD  Result Value Ref Range Status   Specimen Description BLOOD BLOOD RIGHT ARM  Final   Special Requests   Final    BOTTLES DRAWN AEROBIC AND ANAEROBIC Blood Culture  adequate volume   Culture   Final    NO GROWTH 5 DAYS Performed at [facility], [address] [place], [place] [address]    Report Status 03/08/2023 FINAL  Final  Culture, blood (Routine X 2) w Reflex to ID Panel     Status: None   Collection Time: 03/03/23  5:06 AM   Specimen: BLOOD  Result Value Ref Range Status   Specimen Description BLOOD BLOOD RIGHT ARM  Final   Special Requests   Final    BOTTLES DRAWN AEROBIC AND ANAEROBIC Blood Culture adequate volume   Culture   Final    NO GROWTH 5 DAYS Performed at [facility], [address], [place], [place] [address]    Report Status 03/08/2023 FINAL  Final     Time coordinating discharge: Over 35 minutes  SIGNED:   Marinda Elk, MD  [facility] 03/08/2023, 1:06 PM Pager   If 7PM-7AM, please contact night-coverage [email or web address] Password [email or web address]

## 2023-03-08 NOTE — TOC Progression Note (Signed)
Transition of Care [facility]) - Progression Note    Patient Details  Name: Ralph Sullivan MRN: [id number] Date of Birth: 06-05-1945  Transition of Care [facility]) CM/SW Contact  Delilah Shan, LCSWA Phone Number: 03/08/2023, 12:09 PM  Clinical Narrative:     Patient has SNF bed at [facility] when medically ready.Insurance authorization has been approved. French Ana with [facility] confirmed patient can dc over today if medically ready. CSW informed MD.  Expected Discharge Plan: Skilled Nursing Facility Barriers to Discharge: Continued Medical Work up  Expected Discharge Plan and Services       Living arrangements for the past 2 months: Single Family Home                                       Social Determinants of Health (SDOH) Interventions SDOH Screenings   Food Insecurity: No Food Insecurity (02/27/2023)  Housing: Low Risk  (02/27/2023)  Transportation Needs: No Transportation Needs (02/27/2023)  Utilities: Not At Risk (02/27/2023)  Tobacco Use: Medium Risk (02/25/2023)    Readmission Risk Interventions     No data to display

## 2023-03-08 NOTE — Progress Notes (Signed)
[facility] PROGRESS NOTE    Progress Note  Ralph Sullivan  DGL:[id number] DOB: 10-Feb-1946 DOA: 02/25/2023 PCP: Lorenda Ishihara, MD     Brief Narrative:   Ralph Sullivan is an 77 y.o. male past medical history of A-fib on Coumadin chronic kidney disease stage IIIa, CVA comes in with generalized weakness complicated by A-fib with RVR and found to have Enterococcus faecalis bacteremia.  Awaiting skilled nursing facility placement.   Assessment/Plan:   Enterococcus faecalis bacteremia: Likely source genitourinary BC ID positive for Enterococcus, surveillance blood culture on 03/02/2025 negative till date. 2D echo showed an EF of 45%, left ventricular global hypokinesia, no vegetation appreciated. TEE showed an EF of 40% there is no evidence of vegetation. ID recommended to transition to oral amoxicillin which she will continue as an outpatient for sensitive to ampicillin He will continue ampicillin orally as an outpatient end date is 03/16/2023 follow-up with infectious disease on 03/18/2023  Mild left hydroureteronephrosis: Urine cultures showed greater than 100,000 colonies of Enterococcus faecalis. Follow-up with urology as an outpatient.  Small bowel obstruction/small bowel lesion: CT scan of the abdomen pelvis showed partially resolving SBO oral contrast in cecum.  Previously described transition point is no longer appreciated. Surgery on board, have advance diet which she has been tolerated. Resume Coumadin.  A-fib with RVR: Continue metoprolol and Coumadin. Heart rate controlled. Resume Coumadin INR 2.7.  Sinus bradycardia: Clonidine hold. Heart rate now has been fast and metoprolol has been resumed at low-dose okay to increase.  Generalized weakness and deconditioning: Probably related to cardiac condition and age related. PT OT has been consulted recommended skilled nursing facility.  Essential hypertension/chronic systolic heart failure: Clonidine was  held as he was having 3-second pauses. Continue metoprolol.  Chronic kidney disease stage IIIa: With back creatinine at baseline.  Constipation: As per patient has not had a bowel movement in 7 days he is just passing gas and had 1 small real hard bowel movement. Give him a smog MiraLAX p.o. twice daily.  DVT prophylaxis: coumadin Family Communication:none Status is: Inpatient Remains inpatient appropriate because: Enterococcus faecalis bacteremia    Code Status:     Code Status Orders  (From admission, onward)           Start     Ordered   02/26/23 1526  Full code  Continuous       Question:  By:  Answer:  Consent: discussion documented in EHR   02/26/23 1526           Code Status History     Date Active Date Inactive Code Status Order ID Comments User Context   07/23/2022 1721 07/24/2022 1924 Full Code [id number]  Synetta Fail, MD ED   07/16/2016 0756 07/17/2016 1449 Full Code [id number]  Jerre Simon, PA ED   10/14/2015 2325 10/19/2015 1526 Full Code [id number]  Pearson Grippe, MD Inpatient      Advance Directive Documentation    Flowsheet Row Most Recent Value  Type of Advance Directive Healthcare Power of Attorney, Living will  Pre-existing out of facility DNR order (yellow form or pink MOST form) --  "MOST" Form in Place? --         IV Access:   Peripheral IV   Procedures and diagnostic studies:   No results found.   Medical Consultants:   None.   Subjective:    Ralph Sullivan no complaints.  Objective:    Vitals:   03/07/23 1702 03/07/23 2029 03/07/23 2158 03/08/23  0445  BP: 111/72 92/69 101/76 96/68  Pulse:  72 66 (!) 57  Resp:  16  16  Temp:  98 F (36.7 C)  (!) 97.5 F (36.4 C)  TempSrc:  Oral  Oral  SpO2:  98%  100%  Weight:      Height:       SpO2: 100 % O2 Flow Rate (L/min): 4 L/min   Intake/Output Summary (Last 24 hours) at 03/08/2023 0905 Last data filed at 03/08/2023 0000 Gross per 24 hour  Intake  1237.63 ml  Output 300 ml  Net 937.63 ml   Filed Weights   02/25/23 1235 02/27/23 1500  Weight: 70.4 kg 70.4 kg    Exam: General exam: In no acute distress. Respiratory system: Good air movement and clear to auscultation. Cardiovascular system: S1 & S2 heard, RRR. No JVD. Gastrointestinal system: Abdomen is nondistended, soft and nontender.  No hernia palpated Extremities: No pedal edema. Skin: No rashes, lesions or ulcers Psychiatry: Judgement and insight appear normal. Mood & affect appropriate. Data Reviewed:    Labs: Basic Metabolic Panel: Recent Labs  Lab 03/03/23 0504  NA 129*  K 4.1  CL 91*  CO2 26  GLUCOSE 101*  BUN 16  CREATININE 1.39*  CALCIUM 9.4  MG 1.7  PHOS 3.1   GFR Estimated Creatinine Clearance: 45.7 mL/min (A) (by C-G formula based on SCr of 1.39 mg/dL (H)). Liver Function Tests: Recent Labs  Lab 03/03/23 0504  AST 21  ALT 12  ALKPHOS 53  BILITOT 0.8  PROT 6.7  ALBUMIN 3.2*   No results for input(s): "LIPASE", "AMYLASE" in the last 168 hours. No results for input(s): "AMMONIA" in the last 168 hours. Coagulation profile Recent Labs  Lab 03/04/23 0508 03/05/23 0350 03/06/23 0330 03/07/23 0411 03/08/23 0510  INR 3.5* 4.9* 4.3* 2.7* 1.6*   COVID-19 Labs  No results for input(s): "DDIMER", "FERRITIN", "LDH", "CRP" in the last 72 hours.  Lab Results  Component Value Date   SARSCOV2NAA NEGATIVE 02/25/2023    CBC: Recent Labs  Lab 03/03/23 0504 03/06/23 0330 03/08/23 0510  WBC 6.7 6.7 6.2  NEUTROABS 5.1  --   --   HGB 12.6* 12.1* 10.3*  HCT 38.3* 36.0* 32.0*  MCV 99.2 96.5 100.9*  PLT 168 199 211   Cardiac Enzymes: No results for input(s): "CKTOTAL", "CKMB", "CKMBINDEX", "TROPONINI" in the last 168 hours. BNP (last 3 results) No results for input(s): "PROBNP" in the last 8760 hours. CBG: No results for input(s): "GLUCAP" in the last 168 hours.  D-Dimer: No results for input(s): "DDIMER" in the last 72 hours. Hgb  A1c: No results for input(s): "HGBA1C" in the last 72 hours. Lipid Profile: No results for input(s): "CHOL", "HDL", "LDLCALC", "TRIG", "CHOLHDL", "LDLDIRECT" in the last 72 hours. Thyroid function studies: No results for input(s): "TSH", "T4TOTAL", "T3FREE", "THYROIDAB" in the last 72 hours.  Invalid input(s): "FREET3" Anemia work up: No results for input(s): "VITAMINB12", "FOLATE", "FERRITIN", "TIBC", "IRON", "RETICCTPCT" in the last 72 hours. Sepsis Labs: Recent Labs  Lab 03/03/23 0504 03/06/23 0330 03/08/23 0510  WBC 6.7 6.7 6.2   Microbiology Recent Results (from the past 240 hour(s))  Culture, blood (Routine X 2) w Reflex to ID Panel     Status: Abnormal   Collection Time: 03/01/23  5:58 PM   Specimen: BLOOD  Result Value Ref Range Status   Specimen Description BLOOD LEFT ANTECUBITAL  Final   Special Requests   Final    BOTTLES DRAWN AEROBIC AND ANAEROBIC  Blood Culture adequate volume   Culture  Setup Time   Final    GRAM POSITIVE COCCI IN PAIRS AND CHAINS IN BOTH AEROBIC AND ANAEROBIC BOTTLES CRITICAL RESULT CALLED TO, READ BACK BY AND VERIFIED WITH: PHARMD J.FRENS AT 1125 ON 03/02/2023 BY T.SAAD.    Culture (A)  Final    ENTEROCOCCUS FAECALIS SUSCEPTIBILITIES PERFORMED ON PREVIOUS CULTURE WITHIN THE LAST 5 DAYS. STAPHYLOCOCCUS EPIDERMIDIS THE SIGNIFICANCE OF ISOLATING THIS ORGANISM FROM A SINGLE SET OF BLOOD CULTURES WHEN MULTIPLE SETS ARE DRAWN IS UNCERTAIN. PLEASE NOTIFY THE MICROBIOLOGY DEPARTMENT WITHIN ONE WEEK IF SPECIATION AND SENSITIVITIES ARE REQUIRED. Performed at [facility], [address] [place], [place] [address]    Report Status 03/04/2023 FINAL  Final  Blood Culture ID Panel (Reflexed)     Status: Abnormal   Collection Time: 03/01/23  5:58 PM  Result Value Ref Range Status   Enterococcus faecalis DETECTED (A) NOT DETECTED Final    Comment: CRITICAL RESULT CALLED TO, READ BACK BY AND VERIFIED WITH: PHARMD J.FRENS AT 1125 ON 03/02/2023 BY  T.SAAD.    Enterococcus Faecium NOT DETECTED NOT DETECTED Final   Listeria monocytogenes NOT DETECTED NOT DETECTED Final   Staphylococcus species DETECTED (A) NOT DETECTED Final    Comment: CRITICAL RESULT CALLED TO, READ BACK BY AND VERIFIED WITH: PHARMD J.FRENS AT 1125 ON 03/02/2023 BY T.SAAD.    Staphylococcus aureus (BCID) NOT DETECTED NOT DETECTED Final   Staphylococcus epidermidis DETECTED (A) NOT DETECTED Final    Comment: CRITICAL RESULT CALLED TO, READ BACK BY AND VERIFIED WITH: PHARMD J.FRENS AT 1125 ON 03/02/2023 BY T.SAAD.    Staphylococcus lugdunensis NOT DETECTED NOT DETECTED Final   Streptococcus species NOT DETECTED NOT DETECTED Final   Streptococcus agalactiae NOT DETECTED NOT DETECTED Final   Streptococcus pneumoniae NOT DETECTED NOT DETECTED Final   Streptococcus pyogenes NOT DETECTED NOT DETECTED Final   A.calcoaceticus-baumannii NOT DETECTED NOT DETECTED Final   Bacteroides fragilis NOT DETECTED NOT DETECTED Final   Enterobacterales NOT DETECTED NOT DETECTED Final   Enterobacter cloacae complex NOT DETECTED NOT DETECTED Final   Escherichia coli NOT DETECTED NOT DETECTED Final   Klebsiella aerogenes NOT DETECTED NOT DETECTED Final   Klebsiella oxytoca NOT DETECTED NOT DETECTED Final   Klebsiella pneumoniae NOT DETECTED NOT DETECTED Final   Proteus species NOT DETECTED NOT DETECTED Final   Salmonella species NOT DETECTED NOT DETECTED Final   Serratia marcescens NOT DETECTED NOT DETECTED Final   Haemophilus influenzae NOT DETECTED NOT DETECTED Final   Neisseria meningitidis NOT DETECTED NOT DETECTED Final   Pseudomonas aeruginosa NOT DETECTED NOT DETECTED Final   Stenotrophomonas maltophilia NOT DETECTED NOT DETECTED Final   Candida albicans NOT DETECTED NOT DETECTED Final   Candida auris NOT DETECTED NOT DETECTED Final   Candida glabrata NOT DETECTED NOT DETECTED Final   Candida krusei NOT DETECTED NOT DETECTED Final   Candida parapsilosis NOT DETECTED NOT  DETECTED Final   Candida tropicalis NOT DETECTED NOT DETECTED Final   Cryptococcus neoformans/gattii NOT DETECTED NOT DETECTED Final   Methicillin resistance mecA/C NOT DETECTED NOT DETECTED Final   Vancomycin resistance NOT DETECTED NOT DETECTED Final    Comment: Performed at [facility], [address], [place], [place] [address]  Culture, blood (Routine X 2) w Reflex to ID Panel     Status: Abnormal   Collection Time: 03/01/23  6:07 PM   Specimen: BLOOD LEFT ARM  Result Value Ref Range Status   Specimen Description BLOOD LEFT  ARM  Final   Special Requests   Final    BOTTLES DRAWN AEROBIC AND ANAEROBIC Blood Culture adequate volume   Culture  Setup Time   Final    GRAM POSITIVE COCCI IN CHAINS IN BOTH AEROBIC AND ANAEROBIC BOTTLES CRITICAL VALUE NOTED.  VALUE IS CONSISTENT WITH PREVIOUSLY REPORTED AND CALLED VALUE. Performed at [facility], [address] [place], [place] [address]    Culture ENTEROCOCCUS FAECALIS (A)  Final   Report Status 03/04/2023 FINAL  Final   Organism ID, Bacteria ENTEROCOCCUS FAECALIS  Final      Susceptibility   Enterococcus faecalis - MIC*    AMPICILLIN <=2 SENSITIVE Sensitive     VANCOMYCIN 1 SENSITIVE Sensitive     GENTAMICIN SYNERGY SENSITIVE Sensitive     * ENTEROCOCCUS FAECALIS  Urine Culture (for pregnant, neutropenic or urologic patients or patients with an indwelling urinary catheter)     Status: Abnormal   Collection Time: 03/01/23 11:30 PM   Specimen: Urine, Clean Catch  Result Value Ref Range Status   Specimen Description URINE, CLEAN CATCH  Final   Special Requests   Final    NONE Performed at [facility], [address] [place], [place] [address]    Culture (A)  Final    >=100,000 COLONIES/mL ENTEROCOCCUS FAECALIS 40,000 COLONIES/mL KLEBSIELLA OXYTOCA    Report Status 03/04/2023 FINAL  Final   Organism ID, Bacteria ENTEROCOCCUS FAECALIS (A)  Final   Organism ID, Bacteria KLEBSIELLA OXYTOCA (A)  Final       Susceptibility   Enterococcus faecalis - MIC*    AMPICILLIN <=2 SENSITIVE Sensitive     NITROFURANTOIN <=16 SENSITIVE Sensitive     VANCOMYCIN 1 SENSITIVE Sensitive     * >=100,000 COLONIES/mL ENTEROCOCCUS FAECALIS   Klebsiella oxytoca - MIC*    AMPICILLIN >=32 RESISTANT Resistant     CEFEPIME <=0.12 SENSITIVE Sensitive     CEFTRIAXONE <=0.25 SENSITIVE Sensitive     CIPROFLOXACIN <=0.25 SENSITIVE Sensitive     GENTAMICIN <=1 SENSITIVE Sensitive     IMIPENEM <=0.25 SENSITIVE Sensitive     NITROFURANTOIN 32 SENSITIVE Sensitive     TRIMETH/SULFA <=20 SENSITIVE Sensitive     AMPICILLIN/SULBACTAM 16 INTERMEDIATE Intermediate     PIP/TAZO <=4 SENSITIVE Sensitive ug/mL    * 40,000 COLONIES/mL KLEBSIELLA OXYTOCA  Culture, blood (Routine X 2) w Reflex to ID Panel     Status: None   Collection Time: 03/03/23  5:04 AM   Specimen: BLOOD  Result Value Ref Range Status   Specimen Description BLOOD BLOOD RIGHT ARM  Final   Special Requests   Final    BOTTLES DRAWN AEROBIC AND ANAEROBIC Blood Culture adequate volume   Culture   Final    NO GROWTH 5 DAYS Performed at [facility], [address], [place], [place] [address]    Report Status 03/08/2023 FINAL  Final  Culture, blood (Routine X 2) w Reflex to ID Panel     Status: None   Collection Time: 03/03/23  5:06 AM   Specimen: BLOOD  Result Value Ref Range Status   Specimen Description BLOOD BLOOD RIGHT ARM  Final   Special Requests   Final    BOTTLES DRAWN AEROBIC AND ANAEROBIC Blood Culture adequate volume   Culture   Final    NO GROWTH 5 DAYS Performed at [facility], [address] [place], [place] [address]    Report Status 03/08/2023 FINAL  Final  Medications:    cloNIDine  0.1 mg Oral TID   fluticasone  2 spray Each Nare Daily   lidocaine  1 patch Transdermal Q24H   metoprolol tartrate  25 mg Oral BID   pantoprazole (PROTONIX) IV  40 mg Intravenous Q24H   polyethylene glycol  17 g Oral BID   warfarin  4 mg  Oral ONCE-1600   Warfarin - Pharmacist Dosing Inpatient   Does not apply q1600   Continuous Infusions:  ampicillin (OMNIPEN) IV 2 g (03/08/23 0542)      LOS: 8 days   Marinda Elk  [facility]  03/08/2023, 9:05 AM

## 2023-03-08 NOTE — Plan of Care (Signed)

## 2023-03-08 NOTE — Care Management Important Message (Signed)
Important Message  Patient Details  Name: Ralph Sullivan MRN: [id number] Date of Birth: 05-27-1945   Important Message Given:  Yes - Medicare IM     Sherilyn Banker 03/08/2023, 3:08 PM

## 2023-03-08 NOTE — TOC Transition Note (Signed)
Transition of Care [facility]) - CM/SW Discharge Note   Patient Details  Name: Ralph Sullivan MRN: [id number] Date of Birth: December 15, 1945  Transition of Care [facility]) CM/SW Contact:  Delilah Shan, LCSWA Phone Number: 03/08/2023, 1:32 PM   Clinical Narrative:     Patient will DC to: [facility]  Anticipated DC date: 03/08/2023  Family notified : Patient gave CSW permission to contact his friend Metallurgist by: Sharin Mons  ?  Per MD patient ready for DC to [facility] . RN, patient, patient's family, and facility notified of DC. Discharge Summary sent to facility. RN given number for report tele# [phone] RM# [phone]. DC packet on chart. Ambulance transport requested for patient.  CSW signing off.   Final next level of care: Skilled Nursing Facility Barriers to Discharge: No Barriers Identified   Patient Goals and CMS Choice CMS Medicare.gov Compare Post Acute Care list provided to:: Patient Choice offered to / list presented to : Patient  Discharge Placement                Patient chooses bed at: [facility] [place] Patient to be transferred to facility by: [facility]   Patient and family notified of of transfer: 03/08/23  Discharge Plan and Services Additional resources added to the After Visit Summary for                                       Social Determinants of Health (SDOH) Interventions SDOH Screenings   Food Insecurity: No Food Insecurity (02/27/2023)  Housing: Low Risk  (02/27/2023)  Transportation Needs: No Transportation Needs (02/27/2023)  Utilities: Not At Risk (02/27/2023)  Tobacco Use: Medium Risk (02/25/2023)     Readmission Risk Interventions     No data to display

## 2023-03-08 NOTE — Plan of Care (Signed)

## 2023-03-08 NOTE — Progress Notes (Signed)
PHARMACY - ANTICOAGULATION CONSULT NOTE  Pharmacy Consult for Warfarin Indication: atrial fibrillation  Allergies  Allergen Reactions   Allopurinol Other (See Comments)   Colchicine Other (See Comments)   Duloxetine Other (See Comments)    More nervous   Elemental Sulfur Nausea And Vomiting   Oxycontin [Oxycodone Hcl] Nausea And Vomiting    Patient Measurements: Height: 6' (182.9 cm) Weight: 70.4 kg (155 lb 3.3 oz) IBW/kg (Calculated) : 77.6 Heparin dosing weight: 70.4 kg  Vital Signs: Temp: 97.5 F (36.4 C) (11/11 0445) Temp Source: Oral (11/11 0445) BP: 96/68 (11/11 0445) Pulse Rate: 57 (11/11 0445)  Labs: Recent Labs    03/06/23 0330 03/07/23 0411 03/08/23 0510  HGB 12.1*  --  10.3*  HCT 36.0*  --  32.0*  PLT 199  --  211  LABPROT 41.6* 29.0* 19.0*  INR 4.3* 2.7* 1.6*    Estimated Creatinine Clearance: 45.7 mL/min (A) (by C-G formula based on SCr of 1.39 mg/dL (H)).   Medical History: Past Medical History:  Diagnosis Date   Atrial fibrillation (HCC) 02/08/2013   CHADS-VAS - 2 (AGE, HTN). On anticoagulation. Holter 6/14 - 2.3 sec pause. Avg HR 87. 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 Pt has hx AF and CVA and is on warfarin PTA. Warfarin last dose given 11/2, then held with possible SBO. Warfarin resumed 11/5 as there are no plans for procedures  INR today is subtherapeutic at 1.6, CBC ok. INR has been labile this admission.  Home warfarin dose: 5mg  daily (per patient)  Goal of Therapy:  INR 2-3 Monitor  platelets by anticoagulation protocol: Yes   Plan:  -Warfarin 4mg  x1 -Daily PT/INR  Fredonia Highland, PharmD, BCPS, [facility] Clinical Pharmacist [phone] Please check AMION for all [facility] numbers 03/08/2023

## 2023-03-11 DIAGNOSIS — A4181 Sepsis due to Enterococcus: Secondary | ICD-10-CM | POA: Diagnosis not present

## 2023-03-11 DIAGNOSIS — I5022 Chronic systolic (congestive) heart failure: Secondary | ICD-10-CM | POA: Diagnosis not present

## 2023-03-11 DIAGNOSIS — I639 Cerebral infarction, unspecified: Secondary | ICD-10-CM | POA: Diagnosis not present

## 2023-03-11 DIAGNOSIS — R64 Cachexia: Secondary | ICD-10-CM | POA: Diagnosis not present

## 2023-03-11 DIAGNOSIS — N1330 Unspecified hydronephrosis: Secondary | ICD-10-CM | POA: Diagnosis not present

## 2023-03-11 DIAGNOSIS — R531 Weakness: Secondary | ICD-10-CM | POA: Diagnosis not present

## 2023-03-11 DIAGNOSIS — I4811 Longstanding persistent atrial fibrillation: Secondary | ICD-10-CM | POA: Diagnosis not present

## 2023-03-11 DIAGNOSIS — Z7901 Long term (current) use of anticoagulants: Secondary | ICD-10-CM | POA: Diagnosis not present

## 2023-03-11 DIAGNOSIS — R7881 Bacteremia: Secondary | ICD-10-CM | POA: Diagnosis not present

## 2023-03-16 ENCOUNTER — Other Ambulatory Visit: Payer: Self-pay | Admitting: *Deleted

## 2023-03-16 NOTE — Patient Outreach (Signed)
Post- Acute Care Manager follow up. Mr. Gindhart resides in [facility] skilled nursing facility.  Screening for potential chronic care management services as a benefit of health plan and primary care provider.  Collaboration with Jill Side, [facility] Child psychotherapist. Care plan meeting is scheduled for today. Anticipated transition plan is to return home. Has supportive family, friends, and church members that assist. Reports Mr. Bartelson is doing very well with therapy.   Raiford Noble, MSN, RN, BSN [place]  [facility], [facility] RN Post- Acute Care Manager Direct Dial: [phone]

## 2023-03-18 ENCOUNTER — Inpatient Hospital Stay: Payer: Self-pay | Admitting: Infectious Diseases

## 2023-04-01 ENCOUNTER — Telehealth: Payer: Self-pay | Admitting: Cardiology

## 2023-04-01 NOTE — Telephone Encounter (Signed)
Pt is asking for INR orders to be sent to his Home heath agency since with his arthritis the cold weather makes it difficult for him to leave the house... will forward to our anticoagulation clinic and Dr Anne Fu.

## 2023-04-01 NOTE — Telephone Encounter (Signed)
Patient stated he will need orders for [facility] to provide home health services.  Patient stated he cannot walk or stand up very well at this time. Patient stated can call [facility] at [phone].  Fax# [phone].

## 2023-04-01 NOTE — Telephone Encounter (Signed)
Returned a call to the pt and he advised that he is receiving  Home Health services and they can check his INR and advised that he can give them our number to reach [place] so we can advise on checking an INR while in the home if he is receiving nursing services. He was able to write down our number and will give it to the Home Health team when they come out. Advised pt that he does have to be receiving nursing services in order to add on INR checks.    Received a call back from [facility] health and gave orders to [facility] to obtain POC INR on 04/06/23 and call to [phone]

## 2023-04-06 ENCOUNTER — Ambulatory Visit (INDEPENDENT_AMBULATORY_CARE_PROVIDER_SITE_OTHER): Payer: Self-pay | Admitting: Cardiovascular Disease

## 2023-04-06 DIAGNOSIS — I4819 Other persistent atrial fibrillation: Secondary | ICD-10-CM | POA: Diagnosis not present

## 2023-04-06 DIAGNOSIS — Z5181 Encounter for therapeutic drug level monitoring: Secondary | ICD-10-CM

## 2023-04-06 LAB — POCT INR: INR: 1.8 — AB (ref 2.0–3.0)

## 2023-04-13 ENCOUNTER — Ambulatory Visit (INDEPENDENT_AMBULATORY_CARE_PROVIDER_SITE_OTHER): Payer: Self-pay | Admitting: *Deleted

## 2023-04-13 DIAGNOSIS — Z5181 Encounter for therapeutic drug level monitoring: Secondary | ICD-10-CM | POA: Diagnosis not present

## 2023-04-13 DIAGNOSIS — I4819 Other persistent atrial fibrillation: Secondary | ICD-10-CM

## 2023-04-13 LAB — POCT INR: INR: 2.7 (ref 2.0–3.0)

## 2023-04-13 NOTE — Patient Instructions (Signed)
Description   Spoke with Merry Proud [facility]) and advised pt to continue taking Warfarin 1 tablet daily except for 1.5 tablets on Tuesdays.   Recheck INR in 2 weeks.  Drinking 2 boost daily. Call us with any medication changes or questions to Coumadin Clinic # [phone].

## 2023-04-27 ENCOUNTER — Ambulatory Visit (INDEPENDENT_AMBULATORY_CARE_PROVIDER_SITE_OTHER): Payer: Medicare HMO | Admitting: *Deleted

## 2023-04-27 DIAGNOSIS — Z5181 Encounter for therapeutic drug level monitoring: Secondary | ICD-10-CM

## 2023-04-27 DIAGNOSIS — I4819 Other persistent atrial fibrillation: Secondary | ICD-10-CM

## 2023-04-27 LAB — POCT INR: INR: 2.5 (ref 2.0–3.0)

## 2023-04-27 NOTE — Patient Instructions (Signed)
 Description   Spoke with Levon Che LPN [facility]) and advised pt to continue taking Warfarin 1 tablet daily except for 1.5 tablets on Tuesdays.   Recheck INR in 3 weeks.  Drinking 2 boost daily. Call us  with any medication changes or questions to Coumadin  Clinic # [phone].

## 2023-05-18 ENCOUNTER — Ambulatory Visit (INDEPENDENT_AMBULATORY_CARE_PROVIDER_SITE_OTHER): Payer: Self-pay | Admitting: *Deleted

## 2023-05-18 DIAGNOSIS — Z5181 Encounter for therapeutic drug level monitoring: Secondary | ICD-10-CM | POA: Diagnosis not present

## 2023-05-18 DIAGNOSIS — I4819 Other persistent atrial fibrillation: Secondary | ICD-10-CM

## 2023-05-18 LAB — POCT INR: INR: 2 (ref 2.0–3.0)

## 2023-05-18 NOTE — Patient Instructions (Addendum)
Description   Spoke with Gearldine Bienenstock, RN [facility]) and advised pt to take 2 tablets of warfarin today continue taking Warfarin 1 tablet daily except for 1.5 tablets on Tuesdays.   Recheck INR in 4 weeks.  Drinking 2 boost daily. Call us with any medication changes or questions to Coumadin Clinic # [phone].

## 2023-06-02 DIAGNOSIS — N1832 Chronic kidney disease, stage 3b: Secondary | ICD-10-CM | POA: Diagnosis not present

## 2023-06-02 DIAGNOSIS — M1990 Unspecified osteoarthritis, unspecified site: Secondary | ICD-10-CM | POA: Diagnosis not present

## 2023-06-02 DIAGNOSIS — I4891 Unspecified atrial fibrillation: Secondary | ICD-10-CM | POA: Diagnosis not present

## 2023-06-02 DIAGNOSIS — K219 Gastro-esophageal reflux disease without esophagitis: Secondary | ICD-10-CM | POA: Diagnosis not present

## 2023-06-02 DIAGNOSIS — Z8249 Family history of ischemic heart disease and other diseases of the circulatory system: Secondary | ICD-10-CM | POA: Diagnosis not present

## 2023-06-02 DIAGNOSIS — E785 Hyperlipidemia, unspecified: Secondary | ICD-10-CM | POA: Diagnosis not present

## 2023-06-02 DIAGNOSIS — I129 Hypertensive chronic kidney disease with stage 1 through stage 4 chronic kidney disease, or unspecified chronic kidney disease: Secondary | ICD-10-CM | POA: Diagnosis not present

## 2023-06-02 DIAGNOSIS — Z9181 History of falling: Secondary | ICD-10-CM | POA: Diagnosis not present

## 2023-06-02 DIAGNOSIS — D6869 Other thrombophilia: Secondary | ICD-10-CM | POA: Diagnosis not present

## 2023-06-02 DIAGNOSIS — G629 Polyneuropathy, unspecified: Secondary | ICD-10-CM | POA: Diagnosis not present

## 2023-06-02 DIAGNOSIS — Z87891 Personal history of nicotine dependence: Secondary | ICD-10-CM | POA: Diagnosis not present

## 2023-06-02 DIAGNOSIS — I2510 Atherosclerotic heart disease of native coronary artery without angina pectoris: Secondary | ICD-10-CM | POA: Diagnosis not present

## 2023-06-02 DIAGNOSIS — Z008 Encounter for other general examination: Secondary | ICD-10-CM | POA: Diagnosis not present

## 2023-06-09 ENCOUNTER — Other Ambulatory Visit: Payer: Self-pay | Admitting: Cardiology

## 2023-06-09 DIAGNOSIS — I4819 Other persistent atrial fibrillation: Secondary | ICD-10-CM

## 2023-06-09 DIAGNOSIS — Z5181 Encounter for therapeutic drug level monitoring: Secondary | ICD-10-CM

## 2023-06-09 NOTE — Telephone Encounter (Signed)
Warfarin 5mg  refill Afib Last INR 05/18/23 Last OV 01/04/23

## 2023-06-15 ENCOUNTER — Ambulatory Visit (INDEPENDENT_AMBULATORY_CARE_PROVIDER_SITE_OTHER): Payer: Self-pay | Admitting: *Deleted

## 2023-06-15 DIAGNOSIS — Z5181 Encounter for therapeutic drug level monitoring: Secondary | ICD-10-CM

## 2023-06-15 DIAGNOSIS — I4819 Other persistent atrial fibrillation: Secondary | ICD-10-CM

## 2023-06-15 NOTE — Patient Instructions (Addendum)
Description   Spoke with Gearldine Bienenstock, RN [facility]) and advised pt to take 2 tablets of warfarin today then continue taking Warfarin 1 tablet daily except for 1.5 tablets on Tuesdays.   Recheck INR in 2 weeks.  Drinking 2 boost daily. Call us with any medication changes or questions to Coumadin Clinic # [phone].

## 2023-06-29 ENCOUNTER — Ambulatory Visit (INDEPENDENT_AMBULATORY_CARE_PROVIDER_SITE_OTHER): Payer: Self-pay | Admitting: *Deleted

## 2023-06-29 ENCOUNTER — Telehealth: Payer: Self-pay | Admitting: *Deleted

## 2023-06-29 DIAGNOSIS — Z5181 Encounter for therapeutic drug level monitoring: Secondary | ICD-10-CM | POA: Diagnosis not present

## 2023-06-29 DIAGNOSIS — I4819 Other persistent atrial fibrillation: Secondary | ICD-10-CM | POA: Diagnosis not present

## 2023-06-29 LAB — POCT INR: INR: 2.6 (ref 2.0–3.0)

## 2023-06-29 NOTE — Patient Instructions (Signed)
 Description   Spoke with Gearldine Bienenstock, RN [facility]) and advised pt to continue taking Warfarin 1 tablet daily except for 1.5 tablets on Tuesdays.   Recheck INR in 3 weeks.  Drinking 2 boost daily. Call us with any medication changes or questions to Coumadin Clinic # [phone].

## 2023-06-29 NOTE — Telephone Encounter (Signed)
 Called Brandy nurse with [facility] to inquire if INR had been done and she stated she did not him today but will call the nurse that did and inquire. She states she will call back to update. Will await/follow up.

## 2023-07-05 ENCOUNTER — Other Ambulatory Visit: Payer: Self-pay | Admitting: Cardiology

## 2023-07-06 DIAGNOSIS — M1711 Unilateral primary osteoarthritis, right knee: Secondary | ICD-10-CM | POA: Diagnosis not present

## 2023-07-06 DIAGNOSIS — R262 Difficulty in walking, not elsewhere classified: Secondary | ICD-10-CM | POA: Diagnosis not present

## 2023-07-06 DIAGNOSIS — M25662 Stiffness of left knee, not elsewhere classified: Secondary | ICD-10-CM | POA: Diagnosis not present

## 2023-07-06 DIAGNOSIS — M25661 Stiffness of right knee, not elsewhere classified: Secondary | ICD-10-CM | POA: Diagnosis not present

## 2023-07-06 DIAGNOSIS — M1712 Unilateral primary osteoarthritis, left knee: Secondary | ICD-10-CM | POA: Diagnosis not present

## 2023-07-06 DIAGNOSIS — M25562 Pain in left knee: Secondary | ICD-10-CM | POA: Diagnosis not present

## 2023-07-06 DIAGNOSIS — M25561 Pain in right knee: Secondary | ICD-10-CM | POA: Diagnosis not present

## 2023-07-11 ENCOUNTER — Other Ambulatory Visit: Payer: Self-pay | Admitting: Cardiology

## 2023-07-14 ENCOUNTER — Telehealth: Payer: Self-pay | Admitting: Cardiology

## 2023-07-14 ENCOUNTER — Telehealth: Payer: Self-pay

## 2023-07-14 ENCOUNTER — Ambulatory Visit: Admitting: Cardiology

## 2023-07-14 NOTE — Telephone Encounter (Signed)
 Pt of Dr. Anne Fu is requesting a refill of Clonidin. This RX is not in his chart. Can you put it back in the chart. As of 01/04/23 he's supposed to be on this. Please advise

## 2023-07-14 NOTE — Telephone Encounter (Signed)
*  STAT* If patient is at the pharmacy, call can be transferred to refill team.   1. Which medications need to be refilled? (please list name of each medication and dose if known)   cloNIDine (CATAPRES) tablet 0.1 mg     2. Would you like to learn more about the convenience, safety, & potential cost savings by using the [facility]?   3. Are you open to using the [facility] (Type [facility]. ).  4. Which pharmacy/location (including street and city if local pharmacy) is medication to be sent to?  [facility] [address] [place], [place] - [address] [address]   5. Do they need a 30 day or 90 day supply?   30 day  Patient stated he is completely out of this medication.

## 2023-07-15 ENCOUNTER — Telehealth: Payer: Self-pay | Admitting: Cardiology

## 2023-07-15 NOTE — Telephone Encounter (Signed)
 Spoke with [name] regarding pt's medications.  She reports Ralph Sullivan has been taking Clonidine 0.1 mg TID continually, even though it is documented it was to be discontinued while in hospital back in November 2024. She reports his blood pressures have been within normal limits while taking the medication ranging from 114/66 to 140/68.  Today is was 130/68 HR 79.  Instructed I will forward this information to Dr Anne Fu to review and will call back with further instructions/orders.

## 2023-07-15 NOTE — Telephone Encounter (Signed)
 No answer when attempt made to contact Brice.  Left message to call back to discuss.  According to documentation in chart, Clonidine was d/ced when pt was in the hospital in November due to 3 second pauses.  They is no documentation of this office restarting the medication.

## 2023-07-15 NOTE — Telephone Encounter (Signed)
 Pt c/o medication issue:  1. Name of Medication: cloNIDine (CATAPRES) 0.1 MG tablet   2. How are you currently taking this medication (dosage and times per day)? Take 1 tablet (0.2 mg total) by mouth 3 (three) times daily.   3. Are you having a reaction (difficulty breathing--STAT)? No  4. What is your medication issue? Ralph Sullivan is requesting a callback regarding needing clarity on pt being on this medication or not currently. Please advise

## 2023-07-15 NOTE — Telephone Encounter (Signed)
 Clonidine was discontinued when pt was in the hospital in November 2024 d/t 3 second pauses.  This office has not been filling this medication.

## 2023-07-16 MED ORDER — CLONIDINE HCL 0.1 MG PO TABS: 0.1000 mg | ORAL_TABLET | Freq: Three times a day (TID) | ORAL | 1 refills | Status: DC

## 2023-07-16 NOTE — Telephone Encounter (Signed)
 Pt is requesting a callback regarding him stating that the pharmacy told him that his refill was denied for Clonidine 0.1mg . So how is he supposed to get his medication if MD has denied it. Please advise

## 2023-07-16 NOTE — Telephone Encounter (Signed)
 Called pt advised of MD response: ay to continue with clonidine 0.1 mg 3 times daily.  Watch for any signals of syncope/loss of consciousness as he did have a 3-second pause during previous hospitalization.   Advised pt to stop medication and call our office if he feels like he will pass out.  Pt reports BP are good therapy checks during visits.  Reports HR changes d/t hx of AF.  Script sent into pharmacy of pt choice.

## 2023-07-20 ENCOUNTER — Ambulatory Visit (INDEPENDENT_AMBULATORY_CARE_PROVIDER_SITE_OTHER): Payer: Self-pay | Admitting: *Deleted

## 2023-07-20 DIAGNOSIS — Z5181 Encounter for therapeutic drug level monitoring: Secondary | ICD-10-CM

## 2023-07-20 DIAGNOSIS — I4819 Other persistent atrial fibrillation: Secondary | ICD-10-CM | POA: Diagnosis not present

## 2023-07-20 LAB — POCT INR: INR: 2.2 (ref 2.0–3.0)

## 2023-07-20 NOTE — Patient Instructions (Signed)
 Description   Spoke with Gearldine Bienenstock, RN [facility]) and advised pt to continue taking Warfarin 1 tablet daily except for 1.5 tablets on Tuesdays.   Recheck INR in 4 weeks.  Drinking 2 boost daily. Call us with any medication changes or questions to Coumadin Clinic # [phone].

## 2023-08-17 ENCOUNTER — Ambulatory Visit (INDEPENDENT_AMBULATORY_CARE_PROVIDER_SITE_OTHER): Payer: Self-pay | Admitting: *Deleted

## 2023-08-17 DIAGNOSIS — Z5181 Encounter for therapeutic drug level monitoring: Secondary | ICD-10-CM | POA: Diagnosis not present

## 2023-08-17 DIAGNOSIS — I4819 Other persistent atrial fibrillation: Secondary | ICD-10-CM | POA: Diagnosis not present

## 2023-08-17 LAB — POCT INR: INR: 2 (ref 2.0–3.0)

## 2023-08-17 NOTE — Patient Instructions (Signed)
 Description   Spoke with Normand Beckwith, RN [facility]) and advised pt to take 2 tablets of warfarin today then continue taking Warfarin 1 tablet daily except for 1.5 tablets on Tuesdays.   Recheck INR in 4 weeks.  Drinking 2 boost daily. Call us  with any medication changes or questions to Coumadin  Clinic # [phone].

## 2023-08-24 DIAGNOSIS — E46 Unspecified protein-calorie malnutrition: Secondary | ICD-10-CM | POA: Diagnosis not present

## 2023-08-24 DIAGNOSIS — I482 Chronic atrial fibrillation, unspecified: Secondary | ICD-10-CM | POA: Diagnosis not present

## 2023-08-24 DIAGNOSIS — I129 Hypertensive chronic kidney disease with stage 1 through stage 4 chronic kidney disease, or unspecified chronic kidney disease: Secondary | ICD-10-CM | POA: Diagnosis not present

## 2023-08-24 DIAGNOSIS — I5022 Chronic systolic (congestive) heart failure: Secondary | ICD-10-CM | POA: Diagnosis not present

## 2023-08-24 DIAGNOSIS — N1832 Chronic kidney disease, stage 3b: Secondary | ICD-10-CM | POA: Diagnosis not present

## 2023-08-24 DIAGNOSIS — R634 Abnormal weight loss: Secondary | ICD-10-CM | POA: Diagnosis not present

## 2023-08-24 DIAGNOSIS — Z23 Encounter for immunization: Secondary | ICD-10-CM | POA: Diagnosis not present

## 2023-08-24 DIAGNOSIS — R54 Age-related physical debility: Secondary | ICD-10-CM | POA: Diagnosis not present

## 2023-08-24 DIAGNOSIS — E1169 Type 2 diabetes mellitus with other specified complication: Secondary | ICD-10-CM | POA: Diagnosis not present

## 2023-09-14 ENCOUNTER — Ambulatory Visit: Attending: Cardiovascular Disease

## 2023-09-14 DIAGNOSIS — Z5181 Encounter for therapeutic drug level monitoring: Secondary | ICD-10-CM | POA: Diagnosis not present

## 2023-09-14 DIAGNOSIS — I4819 Other persistent atrial fibrillation: Secondary | ICD-10-CM | POA: Diagnosis not present

## 2023-09-14 LAB — POCT INR: INR: 2.3 (ref 2.0–3.0)

## 2023-09-14 NOTE — Patient Instructions (Signed)
Description   Continue taking Warfarin 1 tablet daily except for 1.5 tablets on Tuesdays.   Recheck INR in 6 weeks.  Drinking 2 boost daily.  Call us with any medication changes or questions to Coumadin Clinic # [phone].

## 2023-09-29 ENCOUNTER — Ambulatory Visit: Admitting: Cardiology

## 2023-10-26 ENCOUNTER — Encounter

## 2023-11-02 ENCOUNTER — Ambulatory Visit: Attending: Cardiology

## 2023-11-02 DIAGNOSIS — Z5181 Encounter for therapeutic drug level monitoring: Secondary | ICD-10-CM

## 2023-11-02 LAB — POCT INR: INR: 1.2 — AB (ref 2.0–3.0)

## 2023-11-02 NOTE — Progress Notes (Signed)
Please see anticoagulation encounter.

## 2023-11-02 NOTE — Patient Instructions (Signed)
 Description   Take 2 tablets today and 1.5 tablets tomorrow and then continue taking Warfarin 1 tablet daily except for 1.5 tablets on Tuesdays.   Recheck INR in 2 weeks.  Drinking 2 boost daily. Call us  with any medication changes or questions to Coumadin  Clinic # [phone].

## 2023-11-16 ENCOUNTER — Ambulatory Visit

## 2023-11-16 DIAGNOSIS — Z5181 Encounter for therapeutic drug level monitoring: Secondary | ICD-10-CM

## 2023-11-16 LAB — POCT INR: INR: 2.5 (ref 2.0–3.0)

## 2023-11-16 NOTE — Progress Notes (Signed)
Please see anticoagulation encounter.

## 2023-11-16 NOTE — Patient Instructions (Signed)
 Description   Continue taking Warfarin 1 tablet daily except for 1.5 tablets on Tuesdays.   Recheck INR in 4 weeks.  Drinking 2 boost daily. Call us  with any medication changes or questions to Coumadin  Clinic # [phone].

## 2023-12-13 ENCOUNTER — Other Ambulatory Visit: Payer: Self-pay | Admitting: Cardiology

## 2023-12-13 DIAGNOSIS — I4819 Other persistent atrial fibrillation: Secondary | ICD-10-CM

## 2023-12-13 DIAGNOSIS — Z5181 Encounter for therapeutic drug level monitoring: Secondary | ICD-10-CM

## 2023-12-14 ENCOUNTER — Ambulatory Visit: Attending: Cardiology

## 2023-12-14 DIAGNOSIS — Z5181 Encounter for therapeutic drug level monitoring: Secondary | ICD-10-CM

## 2023-12-14 LAB — POCT INR: INR: 1.9 — AB (ref 2.0–3.0)

## 2023-12-14 NOTE — Telephone Encounter (Signed)
 Warfarin refill completed by another user. Will close this  encounter.

## 2023-12-14 NOTE — Patient Instructions (Signed)
 Description   Take 2 tablets today and then continue taking Warfarin 1 tablet daily except for 1.5 tablets on Tuesdays.   Recheck INR in 6 weeks (per pt request, aware of risk)  Drinking 2 boost daily. Call us  with any medication changes or questions to Coumadin  Clinic # [phone].

## 2023-12-14 NOTE — Progress Notes (Signed)
 INR 1.9; Please see anticoagulation encounter

## 2024-01-09 ENCOUNTER — Other Ambulatory Visit: Payer: Self-pay | Admitting: Cardiology

## 2024-01-10 ENCOUNTER — Encounter: Payer: Self-pay | Admitting: Cardiology

## 2024-01-10 ENCOUNTER — Ambulatory Visit: Attending: Cardiology | Admitting: Cardiology

## 2024-01-10 VITALS — BP 156/91 | HR 78 | Ht 72.0 in | Wt 140.0 lb

## 2024-01-10 DIAGNOSIS — I4819 Other persistent atrial fibrillation: Secondary | ICD-10-CM

## 2024-01-10 DIAGNOSIS — I5022 Chronic systolic (congestive) heart failure: Secondary | ICD-10-CM

## 2024-01-10 MED ORDER — ALUMINUM CHLORIDE 20 % EX SOLN: Freq: Every day | CUTANEOUS | 3 refills | Status: AC

## 2024-01-10 NOTE — Progress Notes (Signed)
  Cardiology Office Note:  .   Date:  01/10/2024  ID:  Ralph Sullivan, DOB 05/16/1945, MRN [id number] PCP: Elliot Charm, MD  [facility] Providers Cardiologist:  Oneil Parchment, MD     History of Present Illness: .   Ralph Sullivan is a 78 y.o. male Discussed the use of AI scribe software   History of Present Illness Ralph Sullivan is a 78 year old male who presents with excessive sweating and hyperhidrosis of the hands.  He experiences excessive sweating and hyperhidrosis of the hands, which has progressively worsened. This condition impairs his ability to hold objects and sign his name. He has tried various creams, including one recommended by his healthcare provider, but they have been ineffective and increased sweating. He is seeking further treatment options to manage this condition.  He takes metoprolol  25 mg as needed if his heart rate exceeds 100 beats per minute and is on Coumadin  for anticoagulation management.  His hypertension is managed with amlodipine , spironolactone , and clonidine .  He experiences cold chills and hand tremors, which he attributes to nerves and anxiety. He also mentions having accidents related to bathroom use, which he finds distressing and has led him to wear protective garments when going out.  He has a history of a prolonged hospital stay and subsequent nursing care due to a kidney infection and heart issues. He expresses a strong connection to his healthcare provider and a desire to continue receiving care from him.      Studies Reviewed: .        Results   Risk Assessment/Calculations:           Physical Exam:   VS:  BP (!) 156/91 (BP Location: Left Arm, Patient Position: Sitting, Cuff Size: Normal)   Pulse 78   Ht 6' (1.829 m)   Wt 140 lb (63.5 kg)   SpO2 97%   BMI 18.99 kg/m    Wt Readings from Last 3 Encounters:  01/10/24 140 lb (63.5 kg)  02/27/23 155 lb 3.3 oz (70.4 kg)  01/04/23 155 lb 3.2 oz (70.4 kg)    GEN: Thin  in no acute distress NECK: No JVD; No carotid bruits CARDIAC: irreg irreg tachy, no murmurs, no rubs, no gallops RESPIRATORY:  Clear to auscultation without rales, wheezing or rhonchi  ABDOMEN: Soft, non-tender, non-distended EXTREMITIES:  No edema; No deformity   ASSESSMENT AND PLAN: .    Assessment and Plan Assessment & Plan Atrial fibrillation perm Atrial fibrillation is well controlled on current medications. - Continue metoprolol  25 mg as needed if heart rate exceeds 100 bpm.  Chronic systolic heart failure - EF 35 to 40% on prior echocardiogram.  TEE performed in 2024 showed no evidence of vegetation or infective endocarditis.  Hypertension Hypertension is well controlled. Off of amlodipine , spironolactone , and clonidine . Recent blood pressure readings were slightly elevated but generally stable.  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 Thyroid  function tests were normal, ruling out hyperthyroidism as a cause. The condition is persistent and has worsened over time. - Discuss Drysol as a topical treatment option to help reduce sweating. - Instruct nurse Pam to provide information and assistance with obtaining Drysol.         Dispo: 1 yr  Signed, Oneil Parchment, MD

## 2024-01-10 NOTE — Patient Instructions (Signed)
 Medication Instructions:  You may use Drysol solution at bedtime as needed. Continue all other medications as listed.  *If you need a refill on your cardiac medications before your next appointment, please call your pharmacy*  Follow-Up: At [facility], you and your health needs are our priority.  As part of our continuing mission to provide you with exceptional heart care, our providers are all part of one team.  This team includes your primary Cardiologist (physician) and Advanced Practice Providers or APPs (Physician Assistants and Nurse Practitioners) who all work together to provide you with the care you need, when you need it.  Your next appointment:   1 year(s)  Provider:   Oneil Parchment, MD    We recommend signing up for the patient portal called MyChart.  Sign up information is provided on this After Visit Summary.  MyChart is used to connect with patients for Virtual Visits (Telemedicine).  Patients are able to view lab/test results, encounter notes, upcoming appointments, etc.  Non-urgent messages can be sent to your provider as well.   To learn more about what you can do with MyChart, go to [email or web address].

## 2024-01-11 ENCOUNTER — Other Ambulatory Visit: Payer: Self-pay | Admitting: Cardiology

## 2024-01-12 ENCOUNTER — Other Ambulatory Visit: Payer: Self-pay | Admitting: Cardiology

## 2024-01-13 ENCOUNTER — Telehealth: Payer: Self-pay | Admitting: Cardiology

## 2024-01-13 NOTE — Telephone Encounter (Signed)
 Pt c/o medication issue:  1. Name of Medication:    2. How are you currently taking this medication (dosage and times per day)?    3. Are you having a reaction (difficulty breathing--STAT)? no  4. What is your medication issue? Patient calling to get a refill on this medication. Please advise

## 2024-01-14 NOTE — Telephone Encounter (Signed)
 Spoke with pt regarding spironolactone . Pt stated he was here to see Dr. Jeffrie on 9/15 and the patient showed the CMA his mediations but never heard from Dr. Jeffrie about whether he should be taking spironolactone  or not. The medication was discontinued in October 2024 but pt stated he has been taking it. Pt was told that Dr. Jeffrie would be sent a message to confirm whether the pt is to be taking it or not. Pt verbalized understanding. All questions if any were answered.

## 2024-01-14 NOTE — Telephone Encounter (Signed)
 Pt is calling requesting a refill on medication spironolactone  25 mg tablet pt taking daily. Pt stated Dr. Jeffrie wanted him to continue to take medication but medication was D/C and not put back on pt's medicatio list. Pt is out of medication. Please address

## 2024-01-19 NOTE — Telephone Encounter (Signed)
 Spironolatone 25 mg daily had been recently prescribed by [employer].

## 2024-01-19 NOTE — Addendum Note (Signed)
 Addended by: THEOTIS SHARLET PARAS on: 01/19/2024 03:36 PM   Modules accepted: Orders

## 2024-01-25 ENCOUNTER — Ambulatory Visit

## 2024-02-01 ENCOUNTER — Ambulatory Visit: Attending: Cardiology

## 2024-02-01 DIAGNOSIS — I4819 Other persistent atrial fibrillation: Secondary | ICD-10-CM

## 2024-02-01 DIAGNOSIS — Z5181 Encounter for therapeutic drug level monitoring: Secondary | ICD-10-CM

## 2024-02-01 LAB — POCT INR: INR: 2.1 (ref 2.0–3.0)

## 2024-02-01 NOTE — Patient Instructions (Signed)
 continue taking Warfarin 1 tablet daily except for 1.5 tablets on Tuesdays.   Recheck INR in 8 weeks (per pt request, aware of risk)  Drinking 2 boost daily. Call us  with any medication changes or questions to Coumadin  Clinic # [phone].

## 2024-03-08 ENCOUNTER — Other Ambulatory Visit: Payer: Self-pay | Admitting: Cardiology

## 2024-03-08 DIAGNOSIS — Z5181 Encounter for therapeutic drug level monitoring: Secondary | ICD-10-CM

## 2024-03-08 DIAGNOSIS — I4819 Other persistent atrial fibrillation: Secondary | ICD-10-CM

## 2024-03-08 NOTE — Telephone Encounter (Signed)
 Warfarin 5mg  refill Afib Last INR 02/01/24 Last OV 01/10/24

## 2024-03-28 ENCOUNTER — Encounter

## 2024-04-04 ENCOUNTER — Ambulatory Visit

## 2024-04-05 DIAGNOSIS — Z0000 Encounter for general adult medical examination without abnormal findings: Secondary | ICD-10-CM | POA: Diagnosis not present

## 2024-04-05 DIAGNOSIS — Z1331 Encounter for screening for depression: Secondary | ICD-10-CM | POA: Diagnosis not present

## 2024-04-05 DIAGNOSIS — Z23 Encounter for immunization: Secondary | ICD-10-CM | POA: Diagnosis not present

## 2024-04-06 ENCOUNTER — Other Ambulatory Visit: Payer: Self-pay | Admitting: Cardiology

## 2024-04-11 ENCOUNTER — Ambulatory Visit: Attending: Cardiology

## 2024-04-11 DIAGNOSIS — Z5181 Encounter for therapeutic drug level monitoring: Secondary | ICD-10-CM | POA: Diagnosis not present

## 2024-04-11 DIAGNOSIS — I4819 Other persistent atrial fibrillation: Secondary | ICD-10-CM | POA: Diagnosis not present

## 2024-04-11 LAB — POCT INR: INR: 2.3 (ref 2.0–3.0)

## 2024-04-11 NOTE — Patient Instructions (Signed)
 continue taking Warfarin 1 tablet daily except for 1.5 tablets on Tuesdays.   Recheck INR in 8 weeks (per pt request, aware of risk)  Drinking 2 boost daily. Call us  with any medication changes or questions to Coumadin  Clinic # [phone].

## 2024-06-06 ENCOUNTER — Ambulatory Visit
